# Patient Record
Sex: Female | Born: 1955
Health system: Southern US, Community
[De-identification: ages and names within clinical notes are randomized; demographics above are authoritative.]

## PROBLEM LIST (undated history)

## (undated) DIAGNOSIS — E1169 Type 2 diabetes mellitus with other specified complication: Secondary | ICD-10-CM

## (undated) DIAGNOSIS — I499 Cardiac arrhythmia, unspecified: Secondary | ICD-10-CM

## (undated) DIAGNOSIS — E119 Type 2 diabetes mellitus without complications: Secondary | ICD-10-CM

## (undated) DIAGNOSIS — I10 Essential (primary) hypertension: Secondary | ICD-10-CM

## (undated) DIAGNOSIS — H269 Unspecified cataract: Secondary | ICD-10-CM

## (undated) DIAGNOSIS — Q249 Congenital malformation of heart, unspecified: Secondary | ICD-10-CM

## (undated) DIAGNOSIS — J302 Other seasonal allergic rhinitis: Secondary | ICD-10-CM

## (undated) DIAGNOSIS — K219 Gastro-esophageal reflux disease without esophagitis: Secondary | ICD-10-CM

## (undated) DIAGNOSIS — E669 Obesity, unspecified: Secondary | ICD-10-CM

## (undated) DIAGNOSIS — I4891 Unspecified atrial fibrillation: Secondary | ICD-10-CM

## (undated) DIAGNOSIS — M1990 Unspecified osteoarthritis, unspecified site: Secondary | ICD-10-CM

## (undated) DIAGNOSIS — E785 Hyperlipidemia, unspecified: Secondary | ICD-10-CM

## (undated) HISTORY — DX: Type 2 diabetes mellitus with other specified complication: E11.69

## (undated) HISTORY — PX: TUBAL LIGATION: SHX77

## (undated) HISTORY — DX: Obesity, unspecified: E66.9

## (undated) HISTORY — DX: Essential (primary) hypertension: I10

## (undated) HISTORY — DX: Congenital malformation of heart, unspecified: Q24.9

## (undated) HISTORY — DX: Unspecified cataract: H26.9

## (undated) HISTORY — DX: Unspecified atrial fibrillation: I48.91

## (undated) HISTORY — DX: Gastro-esophageal reflux disease without esophagitis: K21.9

## (undated) HISTORY — DX: Hyperlipidemia, unspecified: E78.5

## (undated) HISTORY — DX: Type 2 diabetes mellitus without complications: E11.9

---

## 1991-02-08 HISTORY — PX: BREAST CYST EXCISION: SHX579

## 2001-01-15 ENCOUNTER — Emergency Department: Admit: 2001-01-15 | Payer: Self-pay | Source: Emergency Department | Admitting: Emergency Medicine

## 2001-07-06 ENCOUNTER — Emergency Department: Admit: 2001-07-06 | Payer: Self-pay | Source: Emergency Department | Admitting: Emergency Medicine

## 2001-09-17 ENCOUNTER — Ambulatory Visit: Admit: 2001-09-17 | Disposition: A | Discharge status: Home / Self Care | Payer: Self-pay | Source: Ambulatory Visit

## 2002-04-01 ENCOUNTER — Ambulatory Visit: Admit: 2002-04-01 | Disposition: A | Discharge status: Home / Self Care | Payer: Self-pay | Source: Ambulatory Visit

## 2002-07-09 ENCOUNTER — Ambulatory Visit: Admit: 2002-07-09 | Disposition: A | Discharge status: Home / Self Care | Payer: Self-pay | Source: Ambulatory Visit

## 2002-07-24 ENCOUNTER — Ambulatory Visit: Admit: 2002-07-24 | Disposition: A | Discharge status: Home / Self Care | Payer: Self-pay | Source: Ambulatory Visit

## 2002-09-25 ENCOUNTER — Ambulatory Visit: Admit: 2002-09-25 | Disposition: A | Discharge status: Home / Self Care | Payer: Self-pay | Source: Ambulatory Visit

## 2003-07-09 ENCOUNTER — Ambulatory Visit
Admit: 2003-07-09 | Disposition: A | Discharge status: Home / Self Care | Payer: Self-pay | Source: Ambulatory Visit | Admitting: Internal Medicine

## 2003-08-03 ENCOUNTER — Emergency Department: Admit: 2003-08-03 | Payer: Self-pay | Source: Emergency Department | Admitting: Emergency Medicine

## 2005-02-03 ENCOUNTER — Ambulatory Visit
Admit: 2005-02-03 | Disposition: A | Discharge status: Home / Self Care | Payer: Self-pay | Source: Ambulatory Visit | Admitting: Internal Medicine

## 2005-07-28 ENCOUNTER — Ambulatory Visit: Admit: 2005-07-28 | Disposition: A | Discharge status: Home / Self Care | Payer: Self-pay | Source: Ambulatory Visit

## 2005-11-18 ENCOUNTER — Emergency Department: Admit: 2005-11-18 | Payer: Self-pay | Source: Emergency Department | Admitting: Emergency Medicine

## 2006-04-03 ENCOUNTER — Ambulatory Visit: Admit: 2006-04-03 | Disposition: A | Discharge status: Home / Self Care | Payer: Self-pay | Source: Ambulatory Visit

## 2006-07-26 ENCOUNTER — Ambulatory Visit
Admit: 2006-07-26 | Disposition: A | Discharge status: Home / Self Care | Payer: Self-pay | Source: Ambulatory Visit | Admitting: Internal Medicine

## 2006-09-20 ENCOUNTER — Ambulatory Visit
Admit: 2006-09-20 | Disposition: A | Discharge status: Home / Self Care | Payer: Self-pay | Source: Ambulatory Visit | Admitting: Internal Medicine

## 2006-10-11 ENCOUNTER — Ambulatory Visit
Admit: 2006-10-11 | Disposition: A | Discharge status: Home / Self Care | Payer: Self-pay | Source: Ambulatory Visit | Admitting: Internal Medicine

## 2007-08-06 ENCOUNTER — Ambulatory Visit: Admit: 2007-08-06 | Disposition: A | Discharge status: Home / Self Care | Payer: Self-pay | Source: Ambulatory Visit

## 2007-09-27 ENCOUNTER — Ambulatory Visit
Admit: 2007-09-27 | Disposition: A | Discharge status: Home / Self Care | Payer: Self-pay | Source: Ambulatory Visit | Admitting: Internal Medicine

## 2008-02-21 ENCOUNTER — Ambulatory Visit: Admit: 2008-02-21 | Disposition: A | Discharge status: Home / Self Care | Payer: Self-pay | Source: Ambulatory Visit

## 2008-07-30 ENCOUNTER — Ambulatory Visit
Admit: 2008-07-30 | Disposition: A | Discharge status: Home / Self Care | Payer: Self-pay | Source: Ambulatory Visit | Admitting: Internal Medicine

## 2008-08-21 ENCOUNTER — Ambulatory Visit: Admit: 2008-08-21 | Disposition: A | Discharge status: Home / Self Care | Payer: Self-pay | Source: Ambulatory Visit

## 2008-11-19 ENCOUNTER — Ambulatory Visit: Admit: 2008-11-19 | Disposition: A | Discharge status: Home / Self Care | Payer: Self-pay | Source: Ambulatory Visit

## 2008-12-09 ENCOUNTER — Ambulatory Visit
Admit: 2008-12-09 | Disposition: A | Discharge status: Home / Self Care | Payer: Self-pay | Source: Ambulatory Visit | Admitting: Internal Medicine

## 2009-05-20 ENCOUNTER — Ambulatory Visit: Admit: 2009-05-20 | Disposition: A | Discharge status: Home / Self Care | Payer: Self-pay | Source: Ambulatory Visit

## 2009-07-15 ENCOUNTER — Ambulatory Visit: Admit: 2009-07-15 | Disposition: A | Discharge status: Home / Self Care | Payer: Self-pay | Source: Ambulatory Visit

## 2009-08-03 ENCOUNTER — Ambulatory Visit: Admit: 2009-08-03 | Disposition: A | Discharge status: Home / Self Care | Payer: Self-pay | Source: Ambulatory Visit

## 2009-09-11 ENCOUNTER — Ambulatory Visit
Admit: 2009-09-11 | Disposition: A | Discharge status: Home / Self Care | Payer: Self-pay | Source: Ambulatory Visit | Admitting: Family Medicine

## 2009-12-23 ENCOUNTER — Ambulatory Visit: Admit: 2009-12-23 | Disposition: A | Discharge status: Home / Self Care | Payer: Self-pay | Source: Ambulatory Visit

## 2010-03-03 ENCOUNTER — Ambulatory Visit: Admit: 2010-03-03 | Disposition: A | Discharge status: Home / Self Care | Payer: Self-pay | Source: Ambulatory Visit

## 2010-04-21 ENCOUNTER — Ambulatory Visit: Admit: 2010-04-21 | Disposition: A | Discharge status: Home / Self Care | Payer: Self-pay | Source: Ambulatory Visit

## 2010-08-26 ENCOUNTER — Ambulatory Visit
Admit: 2010-08-26 | Discharge: 2010-08-26 | Disposition: A | Discharge status: Home / Self Care | Payer: Self-pay | Source: Ambulatory Visit

## 2010-10-18 ENCOUNTER — Ambulatory Visit
Admit: 2010-10-18 | Discharge: 2010-10-18 | Disposition: A | Discharge status: Home / Self Care | Payer: Self-pay | Source: Ambulatory Visit

## 2010-12-02 ENCOUNTER — Ambulatory Visit
Admit: 2010-12-02 | Discharge: 2010-12-02 | Disposition: A | Discharge status: Home / Self Care | Payer: Self-pay | Source: Ambulatory Visit

## 2011-02-17 ENCOUNTER — Inpatient Hospital Stay
Admission: EM | Admit: 2011-02-17 | Disposition: A | Discharge status: Home / Self Care | Payer: Self-pay | Source: Emergency Department | Attending: Internal Medicine | Admitting: Internal Medicine

## 2011-02-17 DIAGNOSIS — R209 Unspecified disturbances of skin sensation: Secondary | ICD-10-CM

## 2011-02-17 LAB — CBC AND DIFFERENTIAL
Basophils Absolute Automated: 0.02 10*3/uL (ref 0.00–0.20)
Basophils Automated: 0 % (ref 0–2)
Eosinophils Absolute Automated: 0.2 10*3/uL (ref 0.00–0.70)
Eosinophils Automated: 2 % (ref 0–5)
Hematocrit: 40.6 % (ref 37.0–47.0)
Hgb: 14 g/dL (ref 12.0–16.0)
Immature Granulocytes Absolute: 0.03 10*3/uL
Immature Granulocytes: 0 % (ref 0–1)
Lymphocytes Absolute Automated: 3.17 10*3/uL (ref 0.50–4.40)
Lymphocytes Automated: 32 % (ref 15–41)
MCH: 31 pg (ref 28.0–32.0)
MCHC: 34.5 g/dL (ref 32.0–36.0)
MCV: 89.8 fL (ref 80.0–100.0)
MPV: 11.7 fL (ref 9.4–12.3)
Monocytes Absolute Automated: 0.78 10*3/uL (ref 0.00–1.20)
Monocytes: 8 % (ref 0–11)
Neutrophils Absolute: 5.74 10*3/uL (ref 1.80–8.10)
Neutrophils: 58 % (ref 52–75)
Nucleated RBC: 0 /100 WBC
Platelets: 237 10*3/uL (ref 140–400)
RBC: 4.52 10*6/uL (ref 4.20–5.40)
RDW: 13 % (ref 12–15)
WBC: 9.94 10*3/uL (ref 3.50–10.80)

## 2011-02-17 LAB — COMPREHENSIVE METABOLIC PANEL
ALT: 62 U/L — ABNORMAL HIGH (ref 0–55)
AST (SGOT): 48 U/L — ABNORMAL HIGH (ref 5–34)
Albumin/Globulin Ratio: 1.2 (ref 0.9–2.2)
Albumin: 4.2 g/dL (ref 3.5–5.0)
Alkaline Phosphatase: 113 U/L (ref 40–150)
Anion Gap: 13 (ref 5.0–15.0)
BUN: 13 mg/dL (ref 7.0–19.0)
Bilirubin, Total: 0.3 mg/dL (ref 0.2–1.2)
CO2: 25 mEq/L (ref 22–29)
Calcium: 9.7 mg/dL (ref 8.5–10.5)
Chloride: 102 mEq/L (ref 98–107)
Creatinine: 0.7 mg/dL (ref 0.6–1.0)
Globulin: 3.6 g/dL (ref 2.0–3.6)
Glucose: 199 mg/dL — ABNORMAL HIGH (ref 70–100)
Potassium: 4 mEq/L (ref 3.5–5.1)
Protein, Total: 7.8 g/dL (ref 6.0–8.3)
Sodium: 140 mEq/L (ref 136–145)

## 2011-02-17 LAB — GFR: EGFR: >60

## 2011-02-17 LAB — ECG 12-LEAD
Atrial Rate: 78 {beats}/min
P Axis: 83 degrees
P-R Interval: 208 ms
Q-T Interval: 370 ms
QRS Duration: 88 ms
QTC Calculation (Bezet): 421 ms
R Axis: 74 degrees
T Axis: 56 degrees
Ventricular Rate: 78 {beats}/min

## 2011-02-17 LAB — URINALYSIS, REFLEX TO MICROSCOPIC EXAM IF INDICATED
Bilirubin, UA: NEGATIVE
Blood, UA: NEGATIVE
Glucose, UA: 500 — AB
Ketones UA: NEGATIVE
Leukocyte Esterase, UA: NEGATIVE
Nitrite, UA: NEGATIVE
Protein, UR: NEGATIVE
Specific Gravity UA POCT: 1.009 (ref 1.001–1.035)
Urine pH: 7 (ref 5.0–8.0)
Urobilinogen, UA: NEGATIVE mg/dL

## 2011-02-17 LAB — TROPONIN I: Troponin I: 0.01 ng/mL (ref 0.00–0.09)

## 2011-02-17 LAB — TSH: TSH: 0.58 u[IU]/mL (ref 0.35–4.94)

## 2011-02-17 LAB — HEMOLYSIS INDEX: Hemolysis Index: 11 Index (ref 0–18)

## 2011-02-17 NOTE — Consults (Signed)
[name], [name]                                            MRN:          [id number]                                                          Account:      [id number]                                        Document ID:  [id number]                                                   Service Date: 02/17/2011                                                                                    Admit Date: 02/17/2011     Patient Location: A2507-B  Patient Type: I     CONSULTING PHYSICIAN: Gwenevere Abbot MD     REFERRING PHYSICIAN: Valente David MD        CONSULTING SERVICE:  Neurology.     HISTORY OF PRESENT ILLNESS:  Thank you for referring [name] for a neurological consultation.  As  you know, she is a 56 year old right-handed woman who has been admitted for  episode of paresthesias.  The patient says that yesterday night about 8:00,  she suddenly started feeling tingling all over her body and even involving  her face.  It lasted for about 2 hours.  Her son brought her here.  In the  past she has had an episode where she fell down.     The patient was also feeling dizzy and had difficulty walking.  The  numbness was initially on the right side of the face and right upper and  lower extremity.  She had a similar episode about 2 weeks ago.     MEDICATIONS:  Norvasc, aspirin, Tenormin, Lovenox, NovoLog insulin, Zestril, Protonix.     ALLERGIES:  No known drug allergies.     PAST MEDICAL HISTORY:  Hypertension and diabetes mellitus.     PAST SURGICAL HISTORY:  Left breast fibroma removal, CABG.     SOCIAL HISTORY:  Negative for smoking or alcohol use.     FAMILY HISTORY:  Diabetes mellitus and high blood pressure.     REVIEW OF SYSTEMS:  Negative for fever or rashes.  Page 1 of 2  [name], [name]                                            MRN:          [id number]                                                           Account:      [id number]                                        Document ID:  [id number]                                                   Service Date: 02/17/2011                                                                                       PHYSICAL EXAMINATION:  GENERAL:  She was alert and oriented, in no acute distress.  VITAL SIGNS:  Temperature 97.5, pulse 61 per minute, respiratory rate 18  per minute, blood pressure 122/62 mmHg, and pulse oximetry 96%.  NEUROLOGIC:  Speech and language were intact to casual conversation and  history taking.  She was appropriate, cooperative, and followed commands.  Pupils equal, reacting to light bilaterally.  There was no relative  afferent pupillary defect.  Extraocular movements were full without  nystagmus.  Visual fields are full on confrontation.  Face was symmetrical.   Tongue was midline.  She moved all 4 extremities equally.  Sensation was  intact to touch.  She had intact finger-to-nose testing bilaterally.  No  abnormal movements were observed.  Gait examination was deferred.  Deep  tendon reflexes were bilaterally symmetrical and toes were downgoing  bilaterally.     LABORATORY AND DIAGNOSTIC DATA:  White count was 9.94, platelets 237.  Glucose 199.  MRI of the brain has  been ordered.  CT scan of the head did not show any acute abnormalities.     IMPRESSION:  In summary, this is a 56 year old woman with vasculopathic risk factors,  who presented with episodes of paresthesias lasting for a few hours.  Some  were associated with dizziness and gait imbalance. rule out stroke versus tia.     RECOMMENDATIONS:  MRI of the brain and MRA head and neck.  Please check fasting cholesterol   panel.   Continue management of vasculopathic risk factors.     Further plans will be made as test results are available.     Thank you for allowing [place] to participate in the care of  this patient.     Electronic Signing Provider     D:  02/17/2011 09:28 AM by Dr.  Gwenevere Abbot, MD ([id number])  T:  02/17/2011 09:54 AM by [id number]        cc:                                                                                                           Page 2 of 2  Authenticated and Edited by Gwenevere Abbot, MD ([id number]) On 02/17/11 8:51:33 PM

## 2011-02-17 NOTE — H&P (Signed)
[name], [name]                                                    MRN:          [id number]                                                          Account:      [id number]                                        Document ID:  [id number]                                                                                                                                        Admit Date: 02/17/2011     Patient Location: A1000-12  Patient Type: I     ATTENDING PHYSICIAN: Yates Decamp, MD        CHIEF COMPLAINT:  Right-sided numbness.     HISTORY OF PRESENT ILLNESS:  This is a 56 year old female with a past medical history of hypertension  and diabetes mellitus, who presented to emergency room for right-sided body  and face numbness with dizziness and gait difficulty episode.  The patient  states about  numbness of the right facial area and right upper or lower   extremity, which happened last night.  She had a problem with the maintenance   of gait, and then she developed dizziness as well.  All her symptoms resolved   spontaneously in the emergency room.  The patient denies any chest pain or   shortness of breath.  No seizure, no nausea or vomiting. The patient had a   similar episode around 2 weeks ago, which resolved by itself.     ALLERGIES:  No known drug allergies.     SOCIAL HISTORY:  No history of smoking cigarette, alcohol drinking, or illicit drug use.     PAST MEDICAL HISTORY:  Diabetes mellitus and hypertension.     MEDICATIONS AT HOME:  Amlodipine 10 mg p.o. daily, lisinopril/hydrochlorothiazide 20/12.5 mg p.o.  daily, atenolol 50 mg p.o. daily, pravastatin 20 mg p.o. daily, metformin  1000 mg p.o. daily, baby aspirin 81 mg p.o. daily.     REVIEW OF SYSTEMS:  Right facial and right upper and lower extremity numbness, as described  above.  Positive dizziness, but no headache.  No vomiting, no nausea.  No  fever, no chest pain or shortness of  breath.  No abdominal pain.  No change  in bowel  habits, no urinary symptoms.  Review of other systems is negative.     FAMILY HISTORY:  CAD and diabetes mellitus.                                                                                                           Page 1 of 2  [name], [name]                                                    MRN:          [id number]                                                          Account:      [id number]                                        Document ID:  [id number]                                                                                                                                           PHYSICAL EXAMINATION:  VITAL SIGNS:  In the emergency room, temperature is 97.8, pulse 72,  respiratory rate 14, blood pressure 150/75, pulse oximetry 98% on room air.  GENERAL:  At presentation, alert, oriented x3, no focal deficit.  Speech is  clear.  HEENT:  Anicteric sclerae, pink conjunctivae.  NECK:  No cervical adenopathy, no thyromegaly, no carotid bruit.  CHEST:  Clear to auscultation bilaterally.  CARDIOVASCULAR:  S1, S2 normal intensity.  No murmur or gallop.  ABDOMEN:  Soft.  No distention or tenderness.  Bowel sounds present.  EXTREMITIES:  No peripheral edema.  SKIN:  No rashes.  NEUROLOGIC:  At present, no focal deficits.  Cranial nerves II-XII grossly  normal.  Speech is clear.  The patient has mild sensory deficit at the  right upper and lower extremity.  No facial drooping.  LABORATORY AND DIAGNOSTIC DATA:  BUN 13, creatinine 0.7, sodium 140, potassium 4, chloride 102, bicarbonate  25.  WBC 9.9, hemoglobin 14, hematocrit 40, platelets 237.  CT of head  unremarkable.     ASSESSMENT AND PLAN:  1.  Right-sided numbness episode, rule out acute stroke.  The patient will  be admitted to telemetry bed.  Continue with aspirin and Lovenox.  Follow  with neurologic check.  Follow with the MRI of the brain, MRA of head and neck.    Follow with the neurology consultation. 2.  History of hypertension.   Continue   with the lisinopril, Norvasc, and atenolol, blood pressure monitoring and heart   rate. 3.  Diabetes mellitus.  Continue with the metformin, sliding scale insulin  and fingerstick monitoring.           Electronic Signing Provider     D:  02/17/2011 06:17 AM by Dr. Yates Decamp, MD ([id number])  T:  02/17/2011 06:36 AM by [id number]        cc:                                                                                                           Page 2 of 2  Authenticated and Edited by Yates Decamp, MD ([id number]) On 02/17/11 8:04:23 AM

## 2011-02-18 LAB — LIPID PANEL
Cholesterol / HDL Ratio: 3 Index
Cholesterol: 163 mg/dL (ref 0–199)
HDL: 54 mg/dL (ref >40)
LDL Calculated: 84 mg/dL (ref 0–99)
Triglycerides: 126 mg/dL (ref 34–149)
VLDL Calculated: 25 mg/dL (ref 10–40)

## 2011-02-18 LAB — HEMOLYSIS INDEX: Hemolysis Index: 8 Index (ref 0–9)

## 2011-02-19 LAB — HEMOGLOBIN A1C: Hemoglobin A1C: 8.3 % — ABNORMAL HIGH (ref 0.0–6.0)

## 2011-03-02 ENCOUNTER — Encounter (INDEPENDENT_AMBULATORY_CARE_PROVIDER_SITE_OTHER): Payer: Self-pay | Admitting: Neurology

## 2011-03-29 ENCOUNTER — Ambulatory Visit: Payer: Self-pay | Attending: Ophthalmology

## 2011-03-29 ENCOUNTER — Ambulatory Visit: Payer: Self-pay | Attending: Ophthalmology | Admitting: Ophthalmology

## 2011-03-29 DIAGNOSIS — H409 Unspecified glaucoma: Secondary | ICD-10-CM

## 2011-03-29 DIAGNOSIS — H4010X0 Unspecified open-angle glaucoma, stage unspecified: Secondary | ICD-10-CM

## 2011-03-29 NOTE — Progress Notes (Signed)
I/P:   1. 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 H/o unreliable field and OCT tests in the past. IOP stable. Continue Lumigan OU.   8m for DFE for diabetes

## 2011-03-29 NOTE — Progress Notes (Signed)
OCT done Difficult OS constant   Eye movement.

## 2011-03-29 NOTE — Progress Notes (Signed)
HVF  Done

## 2011-08-03 ENCOUNTER — Encounter: Payer: Self-pay | Admitting: Ophthalmology

## 2011-08-03 ENCOUNTER — Ambulatory Visit: Payer: Self-pay | Attending: Ophthalmology | Admitting: Ophthalmology

## 2011-08-03 DIAGNOSIS — E11329 Type 2 diabetes mellitus with mild nonproliferative diabetic retinopathy without macular edema: Secondary | ICD-10-CM | POA: Insufficient documentation

## 2011-08-03 DIAGNOSIS — E113299 Type 2 diabetes mellitus with mild nonproliferative diabetic retinopathy without macular edema, unspecified eye: Secondary | ICD-10-CM

## 2011-08-03 DIAGNOSIS — H579 Unspecified disorder of eye and adnexa: Secondary | ICD-10-CM

## 2011-08-03 DIAGNOSIS — E1139 Type 2 diabetes mellitus with other diabetic ophthalmic complication: Secondary | ICD-10-CM | POA: Insufficient documentation

## 2011-08-03 NOTE — Progress Notes (Signed)
I/P:  1. IDDM: NPDR OU. Strict glycemic & BP control.   2. 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   Fundus photos taken today  43m f/u HVF, OCT and DFE

## 2011-11-09 ENCOUNTER — Other Ambulatory Visit: Payer: Self-pay | Admitting: Family Medicine

## 2011-11-09 DIAGNOSIS — Z1231 Encounter for screening mammogram for malignant neoplasm of breast: Secondary | ICD-10-CM

## 2011-11-21 ENCOUNTER — Ambulatory Visit: Payer: Self-pay | Attending: Family Medicine

## 2011-11-21 DIAGNOSIS — Z1231 Encounter for screening mammogram for malignant neoplasm of breast: Secondary | ICD-10-CM

## 2012-01-25 ENCOUNTER — Ambulatory Visit: Payer: Charity | Attending: Ophthalmology

## 2012-01-25 ENCOUNTER — Ambulatory Visit (HOSPITAL_BASED_OUTPATIENT_CLINIC_OR_DEPARTMENT_OTHER): Payer: Charity

## 2012-01-25 ENCOUNTER — Ambulatory Visit: Payer: Charity | Admitting: Ophthalmology

## 2012-01-25 DIAGNOSIS — H40009 Preglaucoma, unspecified, unspecified eye: Secondary | ICD-10-CM

## 2012-01-25 DIAGNOSIS — H579 Unspecified disorder of eye and adnexa: Secondary | ICD-10-CM | POA: Insufficient documentation

## 2012-01-25 MED ORDER — BIMATOPROST 0.03 % OP SOLN
1.00 [drp] | Freq: Every evening | OPHTHALMIC | Status: DC
Start: 2012-01-25 — End: 2013-06-28

## 2012-01-25 NOTE — Progress Notes (Signed)
56 y/o Hispanic women with:    1. 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 Blepharitis: Warm compresses twice daily for 10 minutes each time. Wash eyelids thorougly for 15 seconds with water/baby shampoo.     3. Dry Eye Syndrome: Use over-the-counter preservative free artificial tears one drop in each eye four times per day (example brands: Refresh, Systane). If no improvement, consider punctal plugs.    4. Diabetes without retinopathy: Encourage tight blood sugar control. Encouraged follow-up with PCP.     4-5 months

## 2012-01-31 NOTE — Progress Notes (Signed)
OCr

## 2012-02-09 NOTE — Progress Notes (Signed)
OCT Done

## 2012-03-16 ENCOUNTER — Other Ambulatory Visit: Payer: Self-pay | Admitting: Family Medicine

## 2012-03-16 DIAGNOSIS — R7989 Other specified abnormal findings of blood chemistry: Secondary | ICD-10-CM

## 2012-03-21 ENCOUNTER — Ambulatory Visit: Payer: Charity | Attending: Family Medicine

## 2012-03-21 DIAGNOSIS — K7689 Other specified diseases of liver: Secondary | ICD-10-CM | POA: Insufficient documentation

## 2012-03-21 DIAGNOSIS — R160 Hepatomegaly, not elsewhere classified: Secondary | ICD-10-CM | POA: Insufficient documentation

## 2012-03-21 DIAGNOSIS — E119 Type 2 diabetes mellitus without complications: Secondary | ICD-10-CM | POA: Insufficient documentation

## 2012-03-21 DIAGNOSIS — R7989 Other specified abnormal findings of blood chemistry: Secondary | ICD-10-CM

## 2012-06-01 ENCOUNTER — Ambulatory Visit (INDEPENDENT_AMBULATORY_CARE_PROVIDER_SITE_OTHER): Payer: BC Managed Care – PPO | Admitting: Internal Medicine

## 2012-06-01 ENCOUNTER — Encounter (INDEPENDENT_AMBULATORY_CARE_PROVIDER_SITE_OTHER): Payer: Self-pay | Admitting: Internal Medicine

## 2012-06-01 VITALS — BP 143/68 | HR 66 | Temp 99.0°F | Resp 16 | Wt 203.0 lb

## 2012-06-01 DIAGNOSIS — Z794 Long term (current) use of insulin: Secondary | ICD-10-CM | POA: Insufficient documentation

## 2012-06-01 DIAGNOSIS — H409 Unspecified glaucoma: Secondary | ICD-10-CM

## 2012-06-01 DIAGNOSIS — E785 Hyperlipidemia, unspecified: Secondary | ICD-10-CM | POA: Insufficient documentation

## 2012-06-01 DIAGNOSIS — E669 Obesity, unspecified: Secondary | ICD-10-CM | POA: Insufficient documentation

## 2012-06-01 DIAGNOSIS — E119 Type 2 diabetes mellitus without complications: Secondary | ICD-10-CM

## 2012-06-01 DIAGNOSIS — I10 Essential (primary) hypertension: Secondary | ICD-10-CM | POA: Insufficient documentation

## 2012-06-01 HISTORY — DX: Unspecified glaucoma: H40.9

## 2012-06-01 MED ORDER — INSULIN NPH ISOPHANE & REGULAR (70-30) 100 UNIT/ML SC SUSP
52.00 [IU] | SUBCUTANEOUS | Status: DC
Start: 2012-06-01 — End: 2012-06-18

## 2012-06-01 NOTE — Progress Notes (Signed)
Subjective:       Patient ID: [name] is a 57 y.o. female.  Seen in clinic on 06/01/2012    Chief Complaint   Patient presents with   . Establish Care   . Diabetes   . Hypertension       HPI  Patient is a 57 year old Hispanic female from [place] with history of obesity, diabetes mellitus 2, hypertension, hyperlipidemia, diabetic retinopathy (per patient), who presents today to establish care and also for followup of diabetes, hypertension, and hyperlipidemia.  Patient states she was being followed by Dr. Ashley Royalty at [facility], last seen in fall 2013.      States she had a recent liver ultrasound done due to elevated liver enzymes, does not know results.     Diabetes mellitus - diagnosed in 1993.  States last A1C was around 9%.  Reports compliance with meds.  Son works at [employer] and apparently has taught her about diabetes. Last eye exam was 02/2012.  Admits having bilateral lower extremity distal paresthesias.  She reports compliance with   Novolin 70/30 55 units am, 25 units in pm.  Measures blood glucose prior to bedtime which runs in 150's, per patient. Tried Lantus but states it didn't work.  Reports compliance with metformin 500mg  BID.      Hypertension - diagnosed 1990's.  BP's at home in 130's/140's systolic.  Denies any problems with recurrent headaches, chest pain, palpitations, shortness of breath, or lower extremity edema.  She reports compliance with amlodipine 10 mg daily, atenolol 50 mg daily, and lisinopril 20 mg daily.    Hyperlipidemia - she does not know the results of her last lipid panel but thinks they were elevated.  She reports compliance with pravastatin 20 mg daily.  She admits she does not watch her diet much though she reports walking frequently.  She denies any myalgias or abdominal pain.    The following portions of the patient's history were reviewed and updated as appropriate: allergies, current medications, past family history, past medical history, past social history,  past surgical history and problem list.    Past Medical History   Diagnosis Date   . Glaucoma 06/01/2012   . Diabetes mellitus type 2 in obese    . Hypertension    . Hyperlipidemia    . Obesity        Past Surgical History   Procedure Date   . Breast cyst excision 1993     bilateral       History     Social History   . Marital Status: Single     Spouse Name: N/A     Number of Children: 3   . Years of Education: N/A     Occupational History   . Maintenance      Social History Main Topics   . Smoking status: Never Smoker    . Smokeless tobacco: Never Used   . Alcohol Use: No   . Drug Use: No   . Sexually Active: Not on file     Other Topics Concern   . Not on file     Social History Narrative    Originally from [place].  Lives with her daughter.Exercise: walks frequently       Family History   Problem Relation Age of Onset   . Heart disease Mother    . Diabetes Mother    . Hypertension Mother    . Hyperlipidemia Mother    . Heart disease Father    .  Diabetes Father    . Hypertension Father    . Hyperlipidemia Father    . Cancer Maternal Aunt      liver       No Known Allergies    Current Outpatient Prescriptions   Medication Sig Dispense Refill   . amLODIPine (NORVASC) 10 MG tablet Take 10 mg by mouth daily.         Marland Kitchen aspirin 81 MG tablet Take 81 mg by mouth daily.         Marland Kitchen atenolol (TENORMIN) 50 MG tablet Take 50 mg by mouth daily.       . bimatoprost (LUMIGAN) 0.03 % ophthalmic drops Place 1 drop into both eyes nightly.  2.5 mL  6   . Calcium Carbonate-Vitamin D (CALCIUM-D PO) Take 1 tablet by mouth daily.       . insulin NPH-insulin regular (NOVOLIN 70/30) (70-30) 100 UNIT/ML injection Inject 52 Units into the skin See Admin Instructions. Take 52 units in the morning and 25 units in the evening  10 mL  2   . lisinopril (PRINIVIL,ZESTRIL) 20 MG tablet Take 40 mg by mouth daily.        . metFORMIN (GLUCOPHAGE) 500 MG tablet Take 1,000 mg by mouth 2 (two) times daily with meals.        Marland Kitchen glipiZIDE (GLUCOTROL) 10 MG  tablet Take 10 mg by mouth 2 (two) times daily before meals.         . pravastatin (PRAVACHOL) 20 MG tablet Take 20 mg by mouth daily.           Review of Systems   Constitutional: Negative for fever, chills, fatigue and unexpected weight change.        -polydypsia or polyuria   HENT: Negative for hearing loss.    Eyes: Negative for visual disturbance.   Respiratory: Negative for cough, chest tightness and shortness of breath.    Cardiovascular: Negative for chest pain, palpitations and leg swelling.        +pain on right medial leg from varicose veins     Gastrointestinal: Negative for nausea, vomiting, diarrhea and constipation.   Genitourinary: Negative for difficulty urinating, vaginal pain, pelvic pain and dyspareunia.   Musculoskeletal: Negative for myalgias and arthralgias.   Skin: Negative for rash.   Neurological: Negative for dizziness, weakness, light-headedness and headaches.        Has occasional pain on head and numbness on face  +distal paresthesias     Hematological: Does not bruise/bleed easily.   Psychiatric/Behavioral: Negative for dysphoric mood.         Objective:    Physical Exam   Vitals reviewed.  Constitutional: She is oriented to person, place, and time. She appears well-developed. No distress.        obese   HENT:        Moist mucous membranes   Eyes: Conjunctivae normal are normal. Right eye exhibits no discharge. Left eye exhibits no discharge. No scleral icterus.   Neck: Neck supple.   Cardiovascular: Normal rate and regular rhythm.  Exam reveals no gallop and no friction rub.    No murmur heard.  Pulmonary/Chest: Effort normal and breath sounds normal. No respiratory distress. She has no wheezes. She has no rales.   Abdominal: Soft. There is no tenderness. There is no rebound and no guarding.   Neurological: She is alert and oriented to person, place, and time.        Monofilament testing impaired on bilateral distal  lower extremities   Skin: Skin is warm and dry. She is not  diaphoretic.   Psychiatric: Her behavior is normal.     BP 143/68  Pulse 66  Temp 99 F (37.2 C) (Oral)  Resp 16  Wt 92.08 kg (203 lb)  SpO2 97%        Assessment:       1. DM (diabetes mellitus), type 2  Ambulatory referral to Ophthalmology, Hemoglobin A1C, insulin NPH-insulin regular (NOVOLIN 70/30) (70-30) 100 UNIT/ML injection, ECG 12 lead   2. Hypertension  Comprehensive metabolic panel, ECG 12 lead   3. Hyperlipidemia  Lipid panel, ECG 12 lead           Plan:       1.  Diabetes mellitus 2 - Poorly controlled as evidenced by patient's home readings and by her last reported A1C several months ago.  We have asked patient to check her blood glucose pre-meal 2 times a day and bring the log into her next visit.  We will also check an A1C to evaluate status of diabetes.  For now, we will continue current Novolin 70/30 55 units in the morning and 25 units in the afternoon.      2.  Hypertension - Blood pressure is above goal of <130/68mmHg, which we discussed w/ patient.  She is hesitant to make medication adjustments at this time.  We have advised her to check BP's at home or at the pharmacy 1-2 days a week and bring log into her next visit.  For now, continue amlodipine 10mg  daily and atenolol 50mg  daily as well as lisinopril 20mg  daily (consider increasing to 40mg  daily during next visit).  Advised low-salt diet and regular exercise as tolerated.     3.  Hyperlipidemia - patient will RTC for fasting lipid panel.  For now continue current pravastatin 20mg  daily.  Advised weight loss and low-fat diet.      4.  Advised to have records sent from prior PCP.    5.  RTC in 1-2 weeks for f/u diabetes, hypertension, hyperlipidemia, and discuss above lab results.

## 2012-06-04 ENCOUNTER — Encounter (INDEPENDENT_AMBULATORY_CARE_PROVIDER_SITE_OTHER): Payer: Self-pay

## 2012-06-06 ENCOUNTER — Ambulatory Visit (INDEPENDENT_AMBULATORY_CARE_PROVIDER_SITE_OTHER): Payer: BC Managed Care – PPO

## 2012-06-06 DIAGNOSIS — I10 Essential (primary) hypertension: Secondary | ICD-10-CM

## 2012-06-06 DIAGNOSIS — E785 Hyperlipidemia, unspecified: Secondary | ICD-10-CM

## 2012-06-06 DIAGNOSIS — E119 Type 2 diabetes mellitus without complications: Secondary | ICD-10-CM

## 2012-06-06 NOTE — Progress Notes (Signed)
Pt visited office for FBW today.  Pt tolerated procedure well.  No further questions or concerns at this time.

## 2012-06-07 LAB — COMPREHENSIVE METABOLIC PANEL
ALT: 59 IU/L — ABNORMAL HIGH (ref 0–32)
AST (SGOT): 58 IU/L — ABNORMAL HIGH (ref 0–40)
Albumin/Globulin Ratio: 1.4 (ref 1.1–2.5)
Albumin: 4.1 g/dL (ref 3.5–5.5)
Alkaline Phosphatase: 117 IU/L (ref 39–117)
BUN / Creatinine Ratio: 19 (ref 9–23)
BUN: 12 mg/dL (ref 6–24)
Bilirubin, Total: 0.3 mg/dL (ref 0.0–1.2)
CO2: 25 mmol/L (ref 19–28)
Calcium: 9.5 mg/dL (ref 8.7–10.2)
Chloride: 103 mmol/L (ref 97–108)
Creatinine: 0.64 mg/dL (ref 0.57–1.00)
EGFR: 100 mL/min/{1.73_m2} (ref >59)
EGFR: 115 mL/min/{1.73_m2} (ref >59)
Globulin, Total: 3 g/dL (ref 1.5–4.5)
Glucose: 90 mg/dL (ref 65–99)
Potassium: 4.4 mmol/L (ref 3.5–5.2)
Protein, Total: 7.1 g/dL (ref 6.0–8.5)
Sodium: 142 mmol/L (ref 134–144)

## 2012-06-07 LAB — LIPID PANEL
Cholesterol / HDL Ratio: 3.6 ratio units (ref 0.0–4.4)
Cholesterol: 170 mg/dL (ref 100–199)
HDL: 47 mg/dL (ref >39)
LDL Calculated: 104 mg/dL — ABNORMAL HIGH (ref 0–99)
Triglycerides: 95 mg/dL (ref 0–149)
VLDL Calculated: 19 mg/dL (ref 5–40)

## 2012-06-07 LAB — HEMOGLOBIN A1C: Hemoglobin A1C: 8.1 % — ABNORMAL HIGH (ref 4.8–5.6)

## 2012-06-15 ENCOUNTER — Ambulatory Visit (INDEPENDENT_AMBULATORY_CARE_PROVIDER_SITE_OTHER): Payer: BC Managed Care – PPO | Admitting: Internal Medicine

## 2012-06-18 ENCOUNTER — Encounter (INDEPENDENT_AMBULATORY_CARE_PROVIDER_SITE_OTHER): Payer: Self-pay | Admitting: Internal Medicine

## 2012-06-18 ENCOUNTER — Ambulatory Visit (INDEPENDENT_AMBULATORY_CARE_PROVIDER_SITE_OTHER): Payer: BC Managed Care – PPO | Admitting: Internal Medicine

## 2012-06-18 VITALS — BP 146/73 | HR 69 | Temp 97.7°F | Ht 65.0 in | Wt 201.0 lb

## 2012-06-18 DIAGNOSIS — K760 Fatty (change of) liver, not elsewhere classified: Secondary | ICD-10-CM | POA: Insufficient documentation

## 2012-06-18 DIAGNOSIS — I10 Essential (primary) hypertension: Secondary | ICD-10-CM

## 2012-06-18 DIAGNOSIS — E119 Type 2 diabetes mellitus without complications: Secondary | ICD-10-CM

## 2012-06-18 DIAGNOSIS — R748 Abnormal levels of other serum enzymes: Secondary | ICD-10-CM | POA: Insufficient documentation

## 2012-06-18 DIAGNOSIS — K7689 Other specified diseases of liver: Secondary | ICD-10-CM

## 2012-06-18 DIAGNOSIS — I872 Venous insufficiency (chronic) (peripheral): Secondary | ICD-10-CM

## 2012-06-18 MED ORDER — ATENOLOL 50 MG PO TABS
50.0000 mg | ORAL_TABLET | Freq: Every day | ORAL | Status: DC
Start: 2012-06-18 — End: 2012-09-27

## 2012-06-18 MED ORDER — LISINOPRIL 40 MG PO TABS
40.0000 mg | ORAL_TABLET | Freq: Every day | ORAL | Status: DC
Start: 2012-06-18 — End: 2012-09-21

## 2012-06-18 MED ORDER — GLIPIZIDE 10 MG PO TABS
10.0000 mg | ORAL_TABLET | Freq: Every day | ORAL | Status: DC
Start: 2012-06-18 — End: 2012-09-21

## 2012-06-18 MED ORDER — INSULIN NPH ISOPHANE & REGULAR (70-30) 100 UNIT/ML SC SUSP
57.0000 [IU] | SUBCUTANEOUS | Status: DC
Start: 2012-06-18 — End: 2012-09-27

## 2012-06-18 MED ORDER — METFORMIN HCL 1000 MG PO TABS
1000.0000 mg | ORAL_TABLET | Freq: Two times a day (BID) | ORAL | Status: DC
Start: 2012-06-18 — End: 2012-09-21

## 2012-06-18 MED ORDER — INSULIN SYRINGE 30G X 5/16" 1 ML MISC
1.00 [IU] | Freq: Two times a day (BID) | Status: DC
Start: 2012-06-18 — End: 2012-12-28

## 2012-06-18 MED ORDER — AMLODIPINE BESYLATE 10 MG PO TABS
10.0000 mg | ORAL_TABLET | Freq: Every day | ORAL | Status: DC
Start: 2012-06-18 — End: 2012-09-27

## 2012-06-18 NOTE — Progress Notes (Signed)
Has the patient sought any care outside of the [facility]? No

## 2012-06-18 NOTE — Progress Notes (Signed)
Subjective:       Patient ID: [name] is a 57 y.o. female.  Encounter performed in Spanish    Chief Complaint   Patient presents with   . Diabetes     follow up   . Hypertension       HPI  Patient is a 57 year old Hispanic female from [place] with history of obesity, diabetes mellitus 2, hypertension, hyperlipidemia, diabetic retinopathy who presents today to discuss recent lab results.     Diabetes mellitus -Last A1C done last month was 8.1%.   Reports compliance with meds. Last eye exam was 02/2012. Admits having bilateral lower extremity distal paresthesias. She reports compliance with Novolin 70/30 55 units am, 25 units in pm. Measures blood glucose prior to bedtime which runs in 90's-100's and in the am runs in 120's to 130's but she did not bring in her glucometer. Reports compliance with metformin 1000mg  BID and glipizide 10mg  daily  .   Hypertension - BP's at home in 130's/140's systolic, per patient. Denies any problems with recurrent headaches, chest pain, palpitations, shortness of breath, or lower extremity edema. She reports compliance with amlodipine 10 mg daily, atenolol 50 mg daily, and lisinopril 20 mg daily.       The following portions of the patient's history were reviewed and updated as appropriate: allergies, current medications, past medical history, past social history and problem list.    Past Medical History   Diagnosis Date   . Glaucoma 06/01/2012   . Diabetes mellitus type 2 in obese    . Hypertension    . Hyperlipidemia    . Obesity        History     Social History   . Marital Status: Single     Spouse Name: N/A     Number of Children: 3   . Years of Education: N/A     Occupational History   . Maintenance      Social History Main Topics   . Smoking status: Never Smoker    . Smokeless tobacco: Never Used   . Alcohol Use: No   . Drug Use: No   . Sexually Active: Not on file     Other Topics Concern   . Not on file     Social History Narrative    Originally from [name].  Lives  with her daughter.Exercise: walks frequently       No Known Allergies    Current Outpatient Prescriptions   Medication Sig Dispense Refill   . amLODIPine (NORVASC) 10 MG tablet Take 10 mg by mouth daily.         Marland Kitchen aspirin 81 MG tablet Take 81 mg by mouth daily.         Marland Kitchen atenolol (TENORMIN) 50 MG tablet Take 50 mg by mouth daily.       . bimatoprost (LUMIGAN) 0.03 % ophthalmic drops Place 1 drop into both eyes nightly.  2.5 mL  6   . Calcium Carbonate-Vitamin D (CALCIUM-D PO) Take 1 tablet by mouth daily.       Marland Kitchen glipiZIDE (GLUCOTROL) 10 MG tablet Take 10 mg by mouth 2 (two) times daily before meals.         . insulin NPH-insulin regular (NOVOLIN 70/30) (70-30) 100 UNIT/ML injection Inject 52 Units into the skin See Admin Instructions. Take 52 units in the morning and 25 units in the evening  10 mL  2   . lisinopril (PRINIVIL,ZESTRIL) 20 MG tablet Take 40 mg  by mouth daily.        . metFORMIN (GLUCOPHAGE) 500 MG tablet Take 1,000 mg by mouth 2 (two) times daily with meals.        . pravastatin (PRAVACHOL) 20 MG tablet Take 20 mg by mouth daily.           Review of Systems  Per HPI      Objective:    Physical Exam   Vitals reviewed.  Constitutional: She is oriented to person, place, and time.        Obese     HENT:        Moist mucous membranes   Eyes:        Wearing glasses   Pulmonary/Chest: No respiratory distress.   Neurological: She is alert and oriented to person, place, and time.   Skin: Skin is warm and dry.        There is skin hyperpigmentation and multiple varicose veins on bilateral distal lower extremities (R>L) suggesting moderate to advanced venous insufficiency   Psychiatric: Her behavior is normal.     BP 146/73  Pulse 69  Temp 97.7 F (36.5 C)  Ht 1.651 m (5\' 5" )  Wt 91.173 kg (201 lb)  BMI 33.45 kg/m2      Chemistry        Component Value Date/Time    NA 142 06/06/2012 0801    K 4.4 06/06/2012 0801    CL 103 06/06/2012 0801    CO2 25 06/06/2012 0801    BUN 12 06/06/2012 0801    GLU 90 06/06/2012  0801    GLU 199* 02/17/2011 0410        Component Value Date/Time    ALKPHOS 117 06/06/2012 0801    AST 58* 06/06/2012 0801    ALT 59* 06/06/2012 0801        Lab Results   Component Value Date    CHOL 170 06/06/2012    CHOL 163 02/18/2011     Lab Results   Component Value Date    HDL 47 06/06/2012    HDL 54 02/18/2011     Lab Results   Component Value Date    LDL 104* 06/06/2012    LDL 84 02/18/2011     Lab Results   Component Value Date    TRIG 95 06/06/2012    TRIG 126 02/18/2011     Lab Results   Component Value Date    HGBA1C 8.1* 06/06/2012           Assessment:       1. DM (diabetes mellitus), type 2  insulin NPH-insulin regular (NOVOLIN 70/30) (70-30) 100 UNIT/ML injection, metFORMIN (GLUCOPHAGE) 1000 MG tablet, glipiZIDE (GLUCOTROL) 10 MG tablet, Insulin Syringe-Needle U-100 (INSULIN SYRINGE 1CC/30GX5/16") 30G X 5/16" 1 ML MISC   2. Hypertension  lisinopril (PRINIVIL,ZESTRIL) 40 MG tablet, atenolol (TENORMIN) 50 MG tablet, amLODIPine (NORVASC) 10 MG tablet   3. Venous insufficiency  Ambulatory referral to Vascular Surgery         Plan:       Diabetes mellitus 2 - Poorly controlled as evidenced by last A1C.  Will increase Novolin 70/30 from 55 units in the am to 57 units in the am and 25 units in the pm to 27 units in the pm.  Continue current metformin 1000mg  BID and glipizide 10mg  daily (in am).  Referral to ophthalmology was re-printed and given to patient.  Information booklet on DM was given to patient in Spanish.  Advised daily  self-foot checks.     Hypertension - suboptimally controlled, with goal <130/45mmHg.  Increase lisinopril from 20mg  daily to 40mg  daily.  Continue amlodipine 10mg  daily and atenolol 50mg  daily.  Advised low-salt diet.  Already walks regularly. Advised to continue monitoring BP's at home w/ goal as above.     Bilateral venous insufficiency with right lower extremity resulting pain - Given patient has been using compression stockings w/o relief, gave her a referral to vein restoration center  for additional evaluation and management    Continue daily ASA 81mg .      Declined colonoscopy.  Would like to have annual occult blood test.     Discussed the above lab results w/ patient and answered all questions.     Risk & Benefits of the new medication(s) were explained to the patient (and family) who verbalized understanding & agreed to the treatment plan. Patient (family) encouraged to contact me/clinical staff with any questions/concerns    Once again, we have advised patient to have records from her prior primary care provider sent to our clinic.    RTC in 3 months for f/u diabetes, hypertension, sooner as needed.

## 2012-06-26 ENCOUNTER — Encounter (INDEPENDENT_AMBULATORY_CARE_PROVIDER_SITE_OTHER): Payer: Self-pay | Admitting: Internal Medicine

## 2012-06-27 ENCOUNTER — Ambulatory Visit: Payer: Charity | Admitting: Ophthalmology

## 2012-07-04 ENCOUNTER — Encounter (INDEPENDENT_AMBULATORY_CARE_PROVIDER_SITE_OTHER): Payer: Self-pay

## 2012-07-04 NOTE — Progress Notes (Signed)
I briefly spoke with the patient at [phone] with the assistance of Spanish Interpreter [phone].  I introduced myself.  She is very interested in Diabetes education.  She says her Fasting BGs have been ranging 90 to 100.  She confirmed that she is taking the Novolin SQ 57 units in the morning and 27 units in the afternoon.    Since she is unable to talk much further at this time, she asks the information to be mailed to her.  Address verified.  The following additional information was mailed:  Recipes  How to create a plate (Spanish version)  Carb counting and meal planning booklet (Spanish version)  [facility] brochure (Spanish version)  Things to know about hypoglycemia handout  Specific Goals for Diabetic Patients  Type 2 Diabetes Basic, 3rd Ed, International Diabetes Center, 2009 - Patient's Total Type 2 Diabetes From Head to Toe (Spanish version)  Diabetes: Foot Care (Spanish version)  Diabetes and Low Blood Sugar (Spanish version)  Basics of Injectable Medicines for Diabetes (Spanish version)  Injectible Medicines for Type 2 Diabetes (Spanish version)  When to Seek Emergency Help for Your Diabetes (Spanish version)  What to Know About Diabetes and Cholesterol (Spanish version)  Type 2 Diabetes and Your Sick-Day Plan (Spanish version)  [facility] brochure (Spanish version)  10 helpful things to know about meal planning (Spanish version)  Carb Counting for Hispanic Foods  Diabetes Nutrition Placemat  Carb Choices and Portion Size  Mix and Scientist, product/process development Tips from ADA  [employer] and [employer] Tips from ADA  [employer] Facts from International Diabetes [facility]  Best Fast Foods information  Healthy Fast Foods information  App Happy article (Diabetes Forecast on January 2014)  White potato versus Sweet potato  Liver handout  80 Calorie Carb Chart - Snacks, Cereals, Crackers information  Meat and Meat Substitutes    I will follow up with her in a couple of  weeks.  I have encouraged her to call me as needed.  She is agreeable.

## 2012-07-17 ENCOUNTER — Encounter (INDEPENDENT_AMBULATORY_CARE_PROVIDER_SITE_OTHER): Payer: Self-pay

## 2012-07-23 ENCOUNTER — Encounter (INDEPENDENT_AMBULATORY_CARE_PROVIDER_SITE_OTHER): Payer: Self-pay

## 2012-07-23 NOTE — Progress Notes (Signed)
I tried to leave a message on the mobile phone [phone]  but no voice mail set up.     I have left a message for the patient at [phone] with the assistance of Spanish Interpreter [phone] to discuss DM further.  Pending call back.

## 2012-09-14 ENCOUNTER — Encounter (INDEPENDENT_AMBULATORY_CARE_PROVIDER_SITE_OTHER): Payer: Self-pay

## 2012-09-14 NOTE — Progress Notes (Signed)
With the assistance of a Spanish Interpreter [phone] I tried to reach the patient at [phone] (M) but unable to leave a message.  We then left a message for the patient at  [phone] (H) about her appointment with Dr. Charlesetta Ivory on 09/18/12 at 0730 and to take in her BG meter and logs.  I asked her to call me back for additional education for DM.  Pending.

## 2012-09-18 ENCOUNTER — Ambulatory Visit (INDEPENDENT_AMBULATORY_CARE_PROVIDER_SITE_OTHER): Payer: BC Managed Care – PPO | Admitting: Internal Medicine

## 2012-09-21 ENCOUNTER — Other Ambulatory Visit (INDEPENDENT_AMBULATORY_CARE_PROVIDER_SITE_OTHER): Payer: Self-pay | Admitting: Internal Medicine

## 2012-09-21 ENCOUNTER — Telehealth (INDEPENDENT_AMBULATORY_CARE_PROVIDER_SITE_OTHER): Payer: Self-pay | Admitting: Internal Medicine

## 2012-09-21 DIAGNOSIS — I10 Essential (primary) hypertension: Secondary | ICD-10-CM

## 2012-09-21 DIAGNOSIS — E119 Type 2 diabetes mellitus without complications: Secondary | ICD-10-CM

## 2012-09-21 MED ORDER — METFORMIN HCL 1000 MG PO TABS
1000.0000 mg | ORAL_TABLET | Freq: Two times a day (BID) | ORAL | Status: DC
Start: 2012-09-21 — End: 2012-09-27

## 2012-09-21 MED ORDER — LISINOPRIL 40 MG PO TABS
40.0000 mg | ORAL_TABLET | Freq: Every day | ORAL | Status: DC
Start: 2012-09-21 — End: 2012-09-27

## 2012-09-21 MED ORDER — GLIPIZIDE 10 MG PO TABS
10.0000 mg | ORAL_TABLET | Freq: Every day | ORAL | Status: DC
Start: 2012-09-21 — End: 2012-09-27

## 2012-09-21 NOTE — Telephone Encounter (Signed)
Prescriptions for glipizide, lisinopril and metformin were sent to preferred pharmacy for #30 days w/o refills.

## 2012-09-21 NOTE — Telephone Encounter (Signed)
Patient left a message on the spanish line requesting a refills for metFORMIN (GLUCOPHAGE) 1000 MG tablet, lisinopril (PRINIVIL,ZESTRIL) 40 MG tablet, and glipiZIDE (GLUCOTROL) 10 MG tablet. She has an appt scheduled for 09/27/12 but she will not have enough meds to last her until she come in. She is requesting for the meds to be called into the [facility] on file.      Thank you!

## 2012-09-27 ENCOUNTER — Ambulatory Visit (INDEPENDENT_AMBULATORY_CARE_PROVIDER_SITE_OTHER): Payer: BC Managed Care – PPO | Admitting: Internal Medicine

## 2012-09-27 ENCOUNTER — Encounter (INDEPENDENT_AMBULATORY_CARE_PROVIDER_SITE_OTHER): Payer: Self-pay | Admitting: Internal Medicine

## 2012-09-27 VITALS — BP 149/76 | HR 68 | Temp 98.3°F | Resp 16 | Ht 65.0 in | Wt 206.4 lb

## 2012-09-27 DIAGNOSIS — M79672 Pain in left foot: Secondary | ICD-10-CM

## 2012-09-27 DIAGNOSIS — E785 Hyperlipidemia, unspecified: Secondary | ICD-10-CM

## 2012-09-27 DIAGNOSIS — E119 Type 2 diabetes mellitus without complications: Secondary | ICD-10-CM

## 2012-09-27 DIAGNOSIS — I10 Essential (primary) hypertension: Secondary | ICD-10-CM

## 2012-09-27 DIAGNOSIS — M79609 Pain in unspecified limb: Secondary | ICD-10-CM

## 2012-09-27 MED ORDER — PRAVASTATIN SODIUM 20 MG PO TABS
20.0000 mg | ORAL_TABLET | Freq: Every day | ORAL | Status: DC
Start: 2012-09-27 — End: 2012-12-28

## 2012-09-27 MED ORDER — LISINOPRIL 40 MG PO TABS
40.0000 mg | ORAL_TABLET | Freq: Every day | ORAL | Status: DC
Start: 2012-09-27 — End: 2012-12-28

## 2012-09-27 MED ORDER — GLIPIZIDE 10 MG PO TABS
10.0000 mg | ORAL_TABLET | Freq: Every day | ORAL | Status: DC
Start: 2012-09-27 — End: 2012-12-28

## 2012-09-27 MED ORDER — INSULIN NPH ISOPHANE & REGULAR (70-30) 100 UNIT/ML SC SUSP
62.0000 [IU] | SUBCUTANEOUS | Status: DC
Start: 2012-09-27 — End: 2012-12-28

## 2012-09-27 MED ORDER — AMLODIPINE BESYLATE 10 MG PO TABS
10.0000 mg | ORAL_TABLET | Freq: Every day | ORAL | Status: DC
Start: 2012-09-27 — End: 2012-12-28

## 2012-09-27 MED ORDER — HYDROCHLOROTHIAZIDE 12.5 MG PO TABS
12.5000 mg | ORAL_TABLET | Freq: Every day | ORAL | Status: DC
Start: 2012-09-27 — End: 2012-12-28

## 2012-09-27 MED ORDER — METFORMIN HCL 1000 MG PO TABS
1000.0000 mg | ORAL_TABLET | Freq: Two times a day (BID) | ORAL | Status: DC
Start: 2012-09-27 — End: 2012-12-28

## 2012-09-27 MED ORDER — ATENOLOL 50 MG PO TABS
50.0000 mg | ORAL_TABLET | Freq: Every day | ORAL | Status: DC
Start: 2012-09-27 — End: 2012-12-28

## 2012-09-27 NOTE — Progress Notes (Signed)
Subjective:       Patient ID: [name] is a 57 y.o. female.  Chief Complaint   Patient presents with   . Diabetes   . Hypertension       HPI  Patient isa 57 y/o hispanic female with history of obesity, diabetes mellitus type 2 hypertension, hyperlipidemia, fatty liver who presents today for followup of medical problems.    Diabetes mellitus 2 - her last hemoglobin A1c was 8.1% in April 2014. Patient reports compliance with metformin 1000 mg 2 times a day, Novolin 70/30 57 units in the morning and 27 units in the evening, glipizide 10mg  daily.  Patient measures her blood sugars 2 times a day and states that prior to breakfast her blood sugars have been running in the 90s to 100s and prior to bedtime they have been running around 100 in the 80s. She does not bring her blood glucose log in today. She does admit she has episodes of tingling in the face when she has low blood sugars and they are higher than the 200s. Of note, she had MRI of the brain and MRI angiogram which did not show any significant abnormalities.  She does not want to go to diabetes education classes as she states she does not have the time to go. She checks her feet regularly and complains of having frequent left foot pain. She has never seen a podiatrist. She was followed by her ophthalmologist regularly for her history of glaucoma and was due to see him in November.  She has never seen a cardiologist.  She denies any problems with frequent chest pain, palpitations, lightheadedness, shortness of breath, increasing lower extremity edema.    Hypertension - she has been checking her blood pressures at home which have been running in the 140 systolic and 70s diastolic. She reports compliance with amlodipine 10 mg daily, atenolol 50 mg daily, lisinopril 40 mg daily. She denies any problems with frequent headaches or visual changes. She has gained 5 pounds since her last visit.    Hyperlipidemia - her last lipid panel done in April 2014 showed  slightly elevated LDL.  She reports compliance with pravastatin 20 mg daily and denies any problems with myalgias or abdominal pain.    She has been seen at the fenestration clinics and apparently recently had a procedure done. She is wearing bilateral compression stockings regularly.    She does have an extensive family history of coronary artery disease.      The following portions of the patient's history were reviewed and updated as appropriate: allergies, current medications, past medical history, past social history and problem list.    Past Medical History   Diagnosis Date   . Glaucoma 06/01/2012   . Diabetes mellitus type 2 in obese    . Hypertension    . Hyperlipidemia    . Obesity        History     Social History   . Marital Status: Single     Spouse Name: N/A     Number of Children: 3   . Years of Education: N/A     Occupational History   . Maintenance      Social History Main Topics   . Smoking status: Never Smoker    . Smokeless tobacco: Never Used   . Alcohol Use: No   . Drug Use: No   . Sexually Active: Not on file     Other Topics Concern   . Not on  file     Social History Narrative    Originally from [place].  Lives with her daughter.Exercise: walks frequently       No Known Allergies    Current Outpatient Prescriptions   Medication Sig Dispense Refill   . amLODIPine (NORVASC) 10 MG tablet Take 1 tablet (10 mg total) by mouth daily.  30 tablet  2   . aspirin 81 MG tablet Take 81 mg by mouth daily.         Marland Kitchen atenolol (TENORMIN) 50 MG tablet Take 1 tablet (50 mg total) by mouth daily.  30 tablet  2   . bimatoprost (LUMIGAN) 0.03 % ophthalmic drops Place 1 drop into both eyes nightly.  2.5 mL  6   . Calcium Carbonate-Vitamin D (CALCIUM-D PO) Take 1 tablet by mouth daily.       Marland Kitchen glipiZIDE (GLUCOTROL) 10 MG tablet Take 1 tablet (10 mg total) by mouth daily.  30 tablet  0   . insulin NPH-insulin regular (NOVOLIN 70/30) (70-30) 100 UNIT/ML injection Inject 57 Units into the skin See Admin Instructions.  Take 57 units in the morning and 27 units in the evening  30 mL  2   . Insulin Syringe-Needle U-100 (INSULIN SYRINGE 1CC/30GX5/16") 30G X 5/16" 1 ML MISC 1 Unit by Does not apply route 2 (two) times daily.  100 each  2   . lisinopril (PRINIVIL,ZESTRIL) 40 MG tablet Take 1 tablet (40 mg total) by mouth daily.  30 tablet  0   . metFORMIN (GLUCOPHAGE) 1000 MG tablet Take 1 tablet (1,000 mg total) by mouth 2 (two) times daily with meals.  60 tablet  0   . pravastatin (PRAVACHOL) 20 MG tablet Take 20 mg by mouth daily.           Review of Systems   Constitutional: Negative for fever and chills.   Respiratory: Negative for chest tightness and shortness of breath.    Cardiovascular: Negative for chest pain and palpitations.   Gastrointestinal: Negative for nausea, vomiting, abdominal pain, constipation and blood in stool.   Genitourinary: Negative for dysuria and hematuria.   Neurological: Negative for light-headedness and headaches.        Facial numbness with fluctuations in blood sugars as per HPI     Psychiatric/Behavioral: Negative for dysphoric mood. The patient is not nervous/anxious.    hormone will      Objective:    Physical Exam   Vitals reviewed.  Constitutional: She appears well-developed. No distress.        Obese     HENT:        Moist mucous membranes   Eyes: Conjunctivae normal are normal. Right eye exhibits no discharge. Left eye exhibits no discharge. No scleral icterus.        Wearing glasses   Cardiovascular: Normal rate and regular rhythm.  Exam reveals no gallop and no friction rub.    No murmur heard.  Pulmonary/Chest: Breath sounds normal. No respiratory distress. She has no wheezes. She has no rales.   Abdominal: Soft. There is no tenderness. There is no rebound and no guarding.   Musculoskeletal: She exhibits no edema.        Wearing bilateral lower extremity compression stockings.    Neurological: She is alert.   Skin: Skin is warm and dry. She is not diaphoretic.   Psychiatric: Her behavior is  normal.     BP 149/76  Pulse 68  Temp 98.3 F (36.8 C) (Oral)  Resp  16  Ht 1.651 m (5\' 5" )  Wt 93.611 kg (206 lb 6 oz)  BMI 34.34 kg/m2  SpO2 97%    Lab Results   Component Value Date    HGBA1C 8.1* 06/06/2012     '  Chemistry        Component Value Date/Time    NA 142 06/06/2012 0801    K 4.4 06/06/2012 0801    CL 103 06/06/2012 0801    CO2 25 06/06/2012 0801    BUN 12 06/06/2012 0801    CREAT 0.64 06/06/2012 0801    CREAT 0.7 02/17/2011 0410    GLU 90 06/06/2012 0801    GLU 199* 02/17/2011 0410        Component Value Date/Time    CA 9.5 06/06/2012 0801    ALKPHOS 117 06/06/2012 0801    AST 58* 06/06/2012 0801    ALT 59* 06/06/2012 0801    BILITOTAL 0.3 06/06/2012 0801        Lab Results   Component Value Date    CHOL 170 06/06/2012    CHOL 163 02/18/2011     Lab Results   Component Value Date    HDL 47 06/06/2012    HDL 54 02/18/2011     Lab Results   Component Value Date    LDL 104* 06/06/2012    LDL 84 02/18/2011     Lab Results   Component Value Date    TRIG 95 06/06/2012    TRIG 126 02/18/2011           Assessment:       1. DM (diabetes mellitus), type 2  Hemoglobin A1C    Comprehensive metabolic panel    Microalbumin, Random Urine    Creatinine, urine, random    glipiZIDE (GLUCOTROL) 10 MG tablet    insulin NPH-insulin regular (HUMULIN,NOVOLIN 70/30) (70-30) 100 UNIT/ML injection    metFORMIN (GLUCOPHAGE) 1000 MG tablet    Ambulatory referral to Cardiology    Ambulatory referral to Podiatry   2. Hypertension  Comprehensive metabolic panel    amLODIPine (NORVASC) 10 MG tablet    atenolol (TENORMIN) 50 MG tablet    lisinopril (PRINIVIL,ZESTRIL) 40 MG tablet    hydrochlorothiazide (HYDRODIURIL) 12.5 MG tablet    Ambulatory referral to Cardiology   3. Hyperlipidemia  Comprehensive metabolic panel    pravastatin (PRAVACHOL) 20 MG tablet    Ambulatory referral to Cardiology   4. Left foot pain  Ambulatory referral to Podiatry           Plan:       Diabetes mellitus 2 - suboptimally controlled last hemoglobin A1c was 7%. We will  increase the dose of her Novolin 70/30 from 57 units in the morning to 62 units in the morning and she will continue current 27 units in the evening.  She will continue current glipizide 10 mg daily and also metformin 1000mg  BID.  Advised to continue checking blood glucose 2-3 times a day.  Patient does not want to go to diabetes education classes at this time and she does not have the time. I have given her a referral to podiatry for routine diabetic foot exam and also for the management of her chronic left hip pain. She is aware she should be seen by ophthalmology regularly for that her glaucoma and also for diabetic eye exams. We have strongly encouraged her to work on weight loss and also regular exercise.    Hypertension - Suboptimally controlled.  Goal is BP <  130/30mmHg.  Will begin therapy w/ HCTZ 12.5mg  daily and continue current lisinopril 40mg  daily, amlodipine 10mg  daily and atenolol 50mg  daily.  Advised low-salt diet and regular exercise.  Continue to monitor BP's at home.     Hyperlipidemia - Borderline control.  Advised diet low in saturated fat.  Continue current pravastatin 20mg  daily.      Will check the above labs.    Gave patient a referral to cardiology for possible stress test to evaluate for CAD given her multiple risk factors and also extensive family history.    RTC in 3 months for f/u diabetes, hypertension, hyperlipemia.  May RTC sooner as needed.

## 2012-09-28 LAB — COMPREHENSIVE METABOLIC PANEL
ALT: 48 IU/L — ABNORMAL HIGH (ref 0–32)
AST (SGOT): 43 IU/L — ABNORMAL HIGH (ref 0–40)
Albumin/Globulin Ratio: 1.4 (ref 1.1–2.5)
Albumin: 4.1 g/dL (ref 3.5–5.5)
Alkaline Phosphatase: 120 IU/L — ABNORMAL HIGH (ref 39–117)
BUN / Creatinine Ratio: 20 (ref 9–23)
BUN: 11 mg/dL (ref 6–24)
Bilirubin, Total: 0.3 mg/dL (ref 0.0–1.2)
CO2: 26 mmol/L (ref 18–29)
Calcium: 9.1 mg/dL (ref 8.7–10.2)
Chloride: 100 mmol/L (ref 97–108)
Creatinine: 0.55 mg/dL — ABNORMAL LOW (ref 0.57–1.00)
EGFR: 104 mL/min/{1.73_m2} (ref >59)
EGFR: 120 mL/min/{1.73_m2} (ref >59)
Globulin, Total: 2.9 g/dL (ref 1.5–4.5)
Glucose: 149 mg/dL — ABNORMAL HIGH (ref 65–99)
Potassium: 5.3 mmol/L — ABNORMAL HIGH (ref 3.5–5.2)
Protein, Total: 7 g/dL (ref 6.0–8.5)
Sodium: 140 mmol/L (ref 134–144)

## 2012-09-28 LAB — MICROALBUMIN, RANDOM URINE
Creatinine, UR: 43.8 mg/dL (ref 15.0–278.0)
Microalbumin, UR: 11.5 ug/mL (ref 0.0–17.0)
Microalbumin/Creatinine Ratio: 26.3 mg/g creat (ref 0.0–30.0)

## 2012-09-28 LAB — HEMOGLOBIN A1C: Hemoglobin A1C: 7.9 % — ABNORMAL HIGH (ref 4.8–5.6)

## 2012-10-18 ENCOUNTER — Encounter (INDEPENDENT_AMBULATORY_CARE_PROVIDER_SITE_OTHER): Payer: Self-pay

## 2012-10-31 ENCOUNTER — Other Ambulatory Visit (INDEPENDENT_AMBULATORY_CARE_PROVIDER_SITE_OTHER): Payer: Self-pay | Admitting: Internal Medicine

## 2012-10-31 DIAGNOSIS — R748 Abnormal levels of other serum enzymes: Secondary | ICD-10-CM

## 2012-11-05 ENCOUNTER — Encounter (INDEPENDENT_AMBULATORY_CARE_PROVIDER_SITE_OTHER): Payer: Self-pay

## 2012-11-05 NOTE — Progress Notes (Signed)
I have spoken with the patient at [phone] with Spanish Interpreters [phone], [phone], [phone], and [phone] because connection was dropped several times.  She does not have an e-mail. We discussed the following:    Diabetes:    She was diagnosed in 1993.  She has not attended Diabetes classes     Blood Glucoses:  She checks BID - Fasting and HS.  Her Fasting range is 70s to 199.  She is symptomatic at 27s.  Her HS range is low 100s to low 200s.    Blood Pressure:  She has a BP machine.  She checks several times a month and as needed for BP.  The BP has memory.    Medications:  Novolin SQ - She takes 57 units in the morning and 27 units in the evening.  Metformin (1000 mg) - She takes one pill po BID with Breakfast and Dinner.  Glipizide (10 mg) - She takes one pill po QD with Breakfast.  We discussed the purpose of the medication and to take before meals.  She will adjust.      Meal Planning:  She uses her breakfast (cereal, 2% milk and small banana).  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  She does try to eat dinner in the evening at the same time but it is usually salad.    We discussed the relationship of liver/blood glucose, relationship of protein/carbohydrates, relationship of fat/absorption of meal, protein options, carbohydrate choices, and  Type 2 Diabetes Basic, 3rd Ed, International Diabetes [facility], 2009 - Your Personal Food Plan for a female to lose weight (6-9 carbohydrate choices/90-135 grams of carbohydrates) are recommended every day.  She will incorporate this into her regimen and try to eat a healthy carbohydrate choice with a little protein within 1 hour of waking up, at least every 6 hours throughout the day, and within 1 hour before sleep.        Feet:  She checks her feet daily.  We discussed checking shoes before wearing them.  She will consider.      Vaccinations:  Flu - She receives annually.    Pneumococcal - We discussed.  She will  discuss further Dr. Charlesetta Ivory.     Follow Up:  Ophthalmologist - She sees annually.  Dentist - We discussed the importance of dental hygiene to prevent gum recession and infection.  She is aware it is important to see biannually.  Dr. Charlesetta Ivory - She has an appointment on 12/28/12 at 0730.    Address verified.  The following additional information was mailed:  Recipes  Diabetes Nutrition Placemat  Carb Choices and Portion Size  Mix and Match Pantry Grocery List  Breakfast/Lunch/Dinner Tips from ADA  Fun Food and Meal Makeover Tips from ADA  Want to lose 10 pounds this year? from ADA  [employer] Facts from International Diabetes [facility]  Five Easy Steps to Create Your Plate  Best Fast Foods information  Healthy Fast Foods information  Foot Care Tips and Exercises  Carb counting and meal planning booklet  Things to know about hypoglycemia handout  Specific Goals for Diabetic Patients  [facility] information (Lowering Salt for Heart Health, and the Activity Pyramid)   Type 2 Diabetes Basic, 3rd Ed, International Diabetes [facility], 2009 - Your Personal Food Plan  Dash diet Information  App Happy article (Diabetes Forecast on January 2014)  Carbohydrate Counting List  White potato versus Sweet potato  Liver handout  80 Calorie Carb Chart - Snacks, Cereals, Crackers information  Meat and Meat Substitutes    I have encouraged her to call me if any questions.  She is agreeable.     Discussion was 1 1/2 hours.

## 2012-11-06 NOTE — Progress Notes (Signed)
Additional information placed in the packet and mailed to the patient:  Recuento de carbohidratos para personas con diabetes tipo 2  Conozca los pasos basicos de la diabetes  La diabetes tipo 2 de la cabeza a los pies  Aspectos basicos de Physiological scientist alimentacion sludable pera la diabetes  La diabetes y Dance movement psychotherapist bajo nivel de Chief Financial Officer en la sangre  Conozca los pasos basicos de la diabetes  List de verifcacion para cita medica de la diabetes  Consejos para administrarse medicamentos inyectables para la diabetes tipo 2  Medicamentos iyectables para la diabetes: datos basicos

## 2012-11-06 NOTE — Progress Notes (Signed)
This is a correction of the contents of the package mailed to the patient adapted for the Spanish need.  Diabetes Nutrition Placemat (Spanish version)  Carb Choices and Portion Size   Mix and Match Pantry Grocery List   Fun Food Facts from [facility]   Five Easy Steps to Create Your Plate (Spanish version)  Best Fast Foods information   Healthy Fast Foods information   Carb counting and meal planning booklet (Spanish version)  Specific Goals for Diabetic Patients   [facility] information (Lowering Salt for Heart Health, and the Activity Pyramid)   Type 2 Diabetes Basic, 3rd Ed, [facility], 2009 - Your Personal Food Plan   Dash diet Information   App Happy article (Diabetes Forecast on January 2014)   Carbohydrate Counting List   White potato versus Sweet potato   Liver handout   [facility] brochure (Spanish version)  Viva bien con la Diabetes  80 Calorie Carb Chart - Snacks, Cereals, Crackers information   Meat and Meat Substitutes

## 2012-11-08 ENCOUNTER — Encounter (INDEPENDENT_AMBULATORY_CARE_PROVIDER_SITE_OTHER): Payer: Self-pay

## 2012-11-19 ENCOUNTER — Encounter (INDEPENDENT_AMBULATORY_CARE_PROVIDER_SITE_OTHER): Payer: Self-pay

## 2012-12-11 ENCOUNTER — Encounter (INDEPENDENT_AMBULATORY_CARE_PROVIDER_SITE_OTHER): Payer: Self-pay

## 2012-12-26 ENCOUNTER — Telehealth (INDEPENDENT_AMBULATORY_CARE_PROVIDER_SITE_OTHER): Payer: Self-pay

## 2012-12-26 NOTE — Telephone Encounter (Signed)
RN Navigator placed previsit call to patient for appt scheduled Friday December 28, 2012 at 0730;    Are you currently taking medications?  Yes    Has there been any changes to your current medication list?  no    Do you have any barriers/adverse reactions to your current medications: No  If YES, please explain:    Have you sought any care outside of the [facility]?  Not reported  If YES, please explain:    Instructed to bring BG logs / meter to appt for review.

## 2012-12-28 ENCOUNTER — Encounter (INDEPENDENT_AMBULATORY_CARE_PROVIDER_SITE_OTHER): Payer: Self-pay | Admitting: Internal Medicine

## 2012-12-28 ENCOUNTER — Ambulatory Visit (INDEPENDENT_AMBULATORY_CARE_PROVIDER_SITE_OTHER): Payer: BC Managed Care – PPO | Admitting: Internal Medicine

## 2012-12-28 ENCOUNTER — Other Ambulatory Visit (INDEPENDENT_AMBULATORY_CARE_PROVIDER_SITE_OTHER): Payer: Self-pay | Admitting: Internal Medicine

## 2012-12-28 VITALS — BP 149/69 | HR 76 | Temp 98.4°F | Resp 16 | Ht 65.0 in | Wt 208.4 lb

## 2012-12-28 DIAGNOSIS — E119 Type 2 diabetes mellitus without complications: Secondary | ICD-10-CM

## 2012-12-28 DIAGNOSIS — Z23 Encounter for immunization: Secondary | ICD-10-CM

## 2012-12-28 DIAGNOSIS — I10 Essential (primary) hypertension: Secondary | ICD-10-CM

## 2012-12-28 DIAGNOSIS — E785 Hyperlipidemia, unspecified: Secondary | ICD-10-CM

## 2012-12-28 MED ORDER — INSULIN SYRINGE 30G X 5/16" 1 ML MISC
1.0000 [IU] | Freq: Two times a day (BID) | Status: DC
Start: 2012-12-28 — End: 2013-03-29

## 2012-12-28 MED ORDER — LISINOPRIL 40 MG PO TABS
40.0000 mg | ORAL_TABLET | Freq: Every day | ORAL | Status: DC
Start: 2012-12-28 — End: 2013-03-29

## 2012-12-28 MED ORDER — AMLODIPINE BESYLATE 10 MG PO TABS
10.0000 mg | ORAL_TABLET | Freq: Every day | ORAL | Status: DC
Start: 2012-12-28 — End: 2013-03-29

## 2012-12-28 MED ORDER — HYDROCHLOROTHIAZIDE 25 MG PO TABS
25.0000 mg | ORAL_TABLET | Freq: Every day | ORAL | Status: DC
Start: 2012-12-28 — End: 2013-03-29

## 2012-12-28 MED ORDER — HYDROCHLOROTHIAZIDE 12.5 MG PO TABS
12.5000 mg | ORAL_TABLET | Freq: Every day | ORAL | Status: DC
Start: 2012-12-28 — End: 2012-12-28

## 2012-12-28 MED ORDER — GLIPIZIDE ER 5 MG PO TB24
5.0000 mg | ORAL_TABLET | Freq: Every day | ORAL | Status: DC
Start: 2012-12-28 — End: 2013-03-29

## 2012-12-28 MED ORDER — INSULIN NPH ISOPHANE & REGULAR (70-30) 100 UNIT/ML SC SUSP
62.0000 [IU] | SUBCUTANEOUS | Status: DC
Start: 2012-12-28 — End: 2013-03-11

## 2012-12-28 MED ORDER — ATENOLOL 50 MG PO TABS
50.0000 mg | ORAL_TABLET | Freq: Every day | ORAL | Status: DC
Start: 2012-12-28 — End: 2013-03-29

## 2012-12-28 MED ORDER — PRAVASTATIN SODIUM 20 MG PO TABS
20.0000 mg | ORAL_TABLET | Freq: Every day | ORAL | Status: DC
Start: 2012-12-28 — End: 2013-03-29

## 2012-12-28 MED ORDER — METFORMIN HCL 1000 MG PO TABS
1000.0000 mg | ORAL_TABLET | Freq: Two times a day (BID) | ORAL | Status: DC
Start: 2012-12-28 — End: 2013-03-29

## 2012-12-28 NOTE — Telephone Encounter (Signed)
Called pharmacy back:  [facility] for 31G syringe-needles  Changed rx from HCTZ 12.5mg  tablets to 25mg  tablets, take one tablet daily

## 2012-12-28 NOTE — Progress Notes (Signed)
Subjective:       Patient ID: [name] is a 57 y.o. female.  Encounter performed in Spanish  Chief Complaint   Patient presents with   . Diabetes   . Hypertension   . Hyperlipidemia       HPI  Patient is a 57 year old Hispanic female with history of diabetes mellitus 2, hypertension, hyperlipidemia who presents today for followup of diabetes, hypertension, and hyperlipidemia.    Since her last visit she has been seen by cardiology and had a stress test that did not show any significant abnormalities.    In regards to her diabetes mellitus 2 - she has been measuring her blood sugars 2 times a day, usually before breakfast, which she states have been running around 100s to 110.  She also mentions her blood sugars prior to dinner which she states have been from 9200.  Her last hemoglobin A1c was 9%.  She denies any polydipsia, polyuria, extremity numbness or tingling.  She does daily self checks and has not noticed any allergies.  She reports compliance with Novolin 70-30 52 units in the morning and 27 units in the evening as well as glipizide 10 mg daily  1 metformin 1000 mg 2 times a day.  She follows up regularly with ophthalmology due to her history of glaucoma    In regards to her hypertension, she states she has been measuring her blood pressures at home which have been running in the 140 systolic and 60s to 70s diastolic.  She reports complaints of amlodipine 10 mg daily, atenolol 50 mg daily, lisinopril 40 mg daily, and hydrochlorothiazide 12.5 mg daily.  She denies any chest pain, palpitations, dyspnea on exertion, lower extremity edema.  She does have occasional headaches on the posterior aspect of her head.    Her last Pap was in April 2014 showed acceptable control.  She reports compliance with pravastatin 20 mg daily and denies any problems with recurrent myalgias or abdominal pain.    She has been following up with the pain specialist who states she has a followup appointment with them in a  month.    Has been having arthralgias of elbows, left foot - saw podiatrist And states she had x-rays done which did not show any significant abnormalities.    She would like to have an influenza vaccine today.    The following portions of the patient's history were reviewed and updated as appropriate: allergies, current medications, past medical history, past social history and problem list.    Past Medical History   Diagnosis Date   . Glaucoma 06/01/2012   . Diabetes mellitus type 2 in obese    . Hypertension    . Hyperlipidemia    . Obesity        History     Social History   . Marital Status: Single     Spouse Name: N/A     Number of Children: 3   . Years of Education: N/A     Occupational History   . Maintenance      Social History Main Topics   . Smoking status: Never Smoker    . Smokeless tobacco: Never Used   . Alcohol Use: No   . Drug Use: No   . Sexually Active: Not on file     Other Topics Concern   . Not on file     Social History Narrative    Originally from [place].  Lives with her daughter.Exercise: walks frequently  No Known Allergies    Current Outpatient Prescriptions   Medication Sig Dispense Refill   . amLODIPine (NORVASC) 10 MG tablet Take 1 tablet (10 mg total) by mouth daily.  30 tablet  2   . aspirin 81 MG tablet Take 81 mg by mouth daily.         Marland Kitchen atenolol (TENORMIN) 50 MG tablet Take 1 tablet (50 mg total) by mouth daily.  30 tablet  2   . bimatoprost (LUMIGAN) 0.03 % ophthalmic drops Place 1 drop into both eyes nightly.  2.5 mL  6   . Calcium Carbonate-Vitamin D (CALCIUM-D PO) Take 1 tablet by mouth daily.       Marland Kitchen glipiZIDE (GLUCOTROL) 10 MG tablet Take 1 tablet (10 mg total) by mouth daily.  30 tablet  2   . hydrochlorothiazide (HYDRODIURIL) 12.5 MG tablet Take 1 tablet (12.5 mg total) by mouth daily.  30 tablet  2   . insulin NPH-insulin regular (HUMULIN,NOVOLIN 70/30) (70-30) 100 UNIT/ML injection Inject 62 Units into the skin See Admin Instructions. Take 62 units in the morning  and 27 units in the evening       . Insulin Syringe-Needle U-100 (INSULIN SYRINGE 1CC/30GX5/16") 30G X 5/16" 1 ML MISC 1 Unit by Does not apply route 2 (two) times daily.  100 each  2   . lisinopril (PRINIVIL,ZESTRIL) 40 MG tablet Take 1 tablet (40 mg total) by mouth daily.  30 tablet  2   . metFORMIN (GLUCOPHAGE) 1000 MG tablet Take 1 tablet (1,000 mg total) by mouth 2 (two) times daily with meals.  60 tablet  2   . pravastatin (PRAVACHOL) 20 MG tablet Take 1 tablet (20 mg total) by mouth daily.  30 tablet  2   . [DISCONTINUED] insulin NPH-insulin regular (HUMULIN,NOVOLIN 70/30) (70-30) 100 UNIT/ML injection Inject 62 Units into the skin See Admin Instructions. Take 57 units in the morning and 27 units in the evening  50 mL  2       Review of Systems        Objective:    Physical Exam   Vitals reviewed.  Constitutional: She is oriented to person, place, and time. She appears well-developed. No distress.        Obese     HENT:        Moist oral mucous membranes   Eyes: Conjunctivae normal are normal. Right eye exhibits no discharge. Left eye exhibits no discharge. No scleral icterus.   Neck: Neck supple.   Cardiovascular: Normal rate and regular rhythm.  Exam reveals no gallop and no friction rub.    No murmur heard.  Pulmonary/Chest: Effort normal. No respiratory distress. She has no wheezes. She has no rales.   Abdominal: Soft. There is no tenderness. There is no rebound and no guarding.        Obese/protuberant abdomen   Musculoskeletal:        +1 bilateral lower extremity edema   Neurological: She is alert and oriented to person, place, and time.   Skin: She is not diaphoretic.        +varicose veins on bilateral lower extremities  Changes consistent w/ venous stasis bilaterally   Psychiatric: Her behavior is normal.     BP 149/69  Pulse 76  Temp 98.4 F (36.9 C) (Oral)  Resp 16  Ht 1.651 m (5\' 5" )  Wt 94.518 kg (208 lb 6 oz)  BMI 34.68 kg/m2    Lab Results   Component Value  Date    HGBA1C 7.9* 09/27/2012      Lab Results   Component Value Date    WBC 9.94 02/17/2011    HGB 14.0 02/17/2011    HCT 40.6 02/17/2011    MCV 89.8 02/17/2011    PLT 237 02/17/2011     '  Chemistry        Component Value Date/Time    NA 140 09/27/2012 0801    K 5.3* 09/27/2012 0801    CL 100 09/27/2012 0801    CO2 26 09/27/2012 0801    BUN 11 09/27/2012 0801    CREAT 0.55* 09/27/2012 0801    CREAT 0.7 02/17/2011 0410    GLU 149* 09/27/2012 0801    GLU 199* 02/17/2011 0410        Component Value Date/Time    CA 9.1 09/27/2012 0801    ALKPHOS 120* 09/27/2012 0801    AST 43* 09/27/2012 0801    ALT 48* 09/27/2012 0801    BILITOTAL 0.3 09/27/2012 0801        Lab Results   Component Value Date    CHOL 170 06/06/2012    CHOL 163 02/18/2011     Lab Results   Component Value Date    HDL 47 06/06/2012    HDL 54 02/18/2011     Lab Results   Component Value Date    LDL 104* 06/06/2012    LDL 84 02/18/2011     Lab Results   Component Value Date    TRIG 95 06/06/2012    TRIG 126 02/18/2011           Assessment:       1. DM (diabetes mellitus), type 2  Comprehensive metabolic panel    Hemoglobin A1C    glipiZIDE XL (GLUCOTROL XL) 5 MG 24 hr tablet    insulin NPH-insulin regular (HUMULIN,NOVOLIN 70/30) (70-30) 100 UNIT/ML injection    metFORMIN (GLUCOPHAGE) 1000 MG tablet    Insulin Syringe-Needle U-100 (INSULIN SYRINGE 1CC/30GX5/16") 30G X 5/16" 1 ML Misc   2. Hypertension  Comprehensive metabolic panel    amLODIPine (NORVASC) 10 MG tablet    atenolol (TENORMIN) 50 MG tablet    lisinopril (PRINIVIL,ZESTRIL) 40 MG tablet    hydrochlorothiazide (HYDRODIURIL) 25 MG tablet    DISCONTINUED: hydrochlorothiazide (HYDRODIURIL) 12.5 MG tablet   3. Hyperlipidemia  Comprehensive metabolic panel    pravastatin (PRAVACHOL) 20 MG tablet   4. Need for influenza vaccination  Flu vaccine QUAD 3 yrs & greater           Plan:       Diabetes mellitus 2 - poorly Controlled on last check, however it appears that her current blood sugar readings her diabetes is under better control for which we will change  Glucotrol 10 mg daily 2 Glucotrol XL 5 mg daily and continue current metformin 1000 mg 2 times a day and also Novolin 70-30 62 units in the morning and 27 units in the evening.  We have encouraged her to continue to monitor her blood sugars are 2-3 times a day prior to meals and at times at bedtime, with goal pre-meal blood sugars under 120.    Hypertension - suboptimally controlled.  We will increase the dose of her hydrochlorothiazide from 12.5-25 mg daily and continue current amlodipine 10 mg daily, atenolol 50 mg daily, and lisinopril 40 mg daily.  We did encourage patient to work on weight loss and also continue with regular exercise and low salt diet.We advise patient  to continue to monitor her blood pressures at home 3-4 days a week, with goal blood pressure under 130/80 mmHg.  If her blood pressures are not at goal over the next 2-4 weeks, and she will return to clinic sooner.    Hyperlipidemia - patient is not fasting today for which we did not check a lipid panel but we have advised her that during her next visit she should come by fasting so that we can check her lipid panel.  We will check the above chemistries and, for now, she will continue current pravastatin 20 mg daily.    She will continue followup with the pain specialist as scheduled.    Influenza vaccine was administered today in clinic.    She will return to clinic in 3 months for followup of diabetes, hypertension, and hyperlipidemia, sooner as needed.

## 2012-12-28 NOTE — Telephone Encounter (Signed)
[name] from [facility] called regarding two meds that were sent in today.  Please call her at [phone]    For hydrochlorothiazide (HYDRODIURIL) 12.5 MG tablet , they don't have tablet, just capsule. And they want to know if it is okay.  Second, for Insulin Syringe-Needle U-100 (INSULIN SYRINGE 1CC/30GX5/16") 30G X 5/16" 1 ML , patient used to get 31G not 30G.  So please give the pharmacist a call.    Thanks    [employer]

## 2012-12-29 LAB — COMPREHENSIVE METABOLIC PANEL
ALT: 63 U/L — ABNORMAL HIGH (ref 6–29)
AST (SGOT): 71 U/L — ABNORMAL HIGH (ref 10–35)
Albumin/Globulin Ratio: 1.2 (ref 1.0–2.5)
Albumin: 3.9 G/DL (ref 3.6–5.1)
Alkaline Phosphatase: 93 U/L (ref 33–130)
BUN: 25 MG/DL (ref 7–25)
Bilirubin, Total: 0.4 MG/DL (ref 0.2–1.2)
CO2: 25 mmol/L (ref 19–30)
Calcium: 9 MG/DL (ref 8.6–10.4)
Chloride: 104 mmol/L (ref 98–110)
Creatinine: 0.78 mg/dL (ref 0.50–1.05)
EGFR African American: 98 mL/min/{1.73_m2} (ref >=60)
EGFR: 84 mL/min/{1.73_m2} (ref >=60)
Globulin: 3.3 G/DL (ref 1.9–3.7)
Glucose: 182 MG/DL — ABNORMAL HIGH (ref 65–99)
Potassium: 4.6 mmol/L (ref 3.5–5.3)
Protein, Total: 7.2 G/DL (ref 6.1–8.1)
Sodium: 139 mmol/L (ref 135–146)

## 2012-12-29 LAB — HEMOGLOBIN A1C: Hemoglobin A1C: 9 % — ABNORMAL HIGH (ref <5.7)

## 2013-01-08 ENCOUNTER — Encounter (INDEPENDENT_AMBULATORY_CARE_PROVIDER_SITE_OTHER): Payer: Self-pay

## 2013-01-08 NOTE — Progress Notes (Signed)
I have noted the patient's increase in A1c.    Results for Miranda, [name] (MRN [id number]) as of 01/08/2013 12:13   Ref. Range 09/27/2012 08:01 12/28/2012 08:45   Hemoglobin A1C Latest Range: <5.7 % 7.9 (H) 9.0 (H)       I have tried to call the patient at [phone] with Spanish Interpreter [phone] to discuss DM further but no voicemail.

## 2013-01-18 ENCOUNTER — Encounter (INDEPENDENT_AMBULATORY_CARE_PROVIDER_SITE_OTHER): Payer: Self-pay | Admitting: Internal Medicine

## 2013-01-18 ENCOUNTER — Ambulatory Visit (INDEPENDENT_AMBULATORY_CARE_PROVIDER_SITE_OTHER): Payer: BC Managed Care – PPO | Admitting: Internal Medicine

## 2013-01-18 VITALS — BP 120/80 | HR 67 | Temp 98.4°F | Resp 16 | Ht 65.0 in

## 2013-01-18 DIAGNOSIS — R059 Cough, unspecified: Secondary | ICD-10-CM

## 2013-01-18 DIAGNOSIS — J069 Acute upper respiratory infection, unspecified: Secondary | ICD-10-CM

## 2013-01-18 MED ORDER — BENZONATATE 100 MG PO CAPS
100.0000 mg | ORAL_CAPSULE | Freq: Three times a day (TID) | ORAL | Status: DC | PRN
Start: 2013-01-18 — End: 2013-03-29

## 2013-01-18 MED ORDER — AZITHROMYCIN 250 MG PO TABS
ORAL_TABLET | ORAL | Status: AC
Start: 2013-01-18 — End: 2013-01-23

## 2013-01-18 NOTE — Patient Instructions (Addendum)
Aumente insulina de por la manana de 62 unidades a 68 unidades  Aumente insulina de por la tarde de 27 unidades a 32 unidades        MEDICATION: AZITHROMYCIN  You have been prescribed azithromycin (brand: Zithromax), an antibiotic that treats bacterial infections.  DIRECTIONS FOR USE:  Azithromycin can be taken with or without food. Take it with a full glass of water.If you find that it causes nausea when taken on an empty stomach, take it with food.  Take all of the medicine until it is gone, even if you are feeling better. This will ensure that your infection is fully treated.  WHAT TO WATCH FOR:  POSSIBLE SIDE EFFECTS: Nausea, vomiting, stomach pain or diarrhea (Take with food and contact your doctor if any of these symptoms persist or become severe).  ALLERGIC REACTION: Rash, itching, swelling, trouble breathing or swallowing (Contact your doctor or return to this facility promptly).  MEDICAL CONDITIONS: Before starting this medicine, be sure your doctor knows if you have any of the following conditions:   Allergic reaction to erythromycin or Biaxin (clarithromycin)   Liver or kidney disease   Breastfeeding  DRUG INTERACTIONS: Before starting this medicine, be sure your doctor knows if you are taking any of the following drugs:   Lanoxin (digoxin), phenothiazines (such as chlorpromazine, promethazine), Coumadin (warfarin), Ranexa (ranolazine), Cordarone (amiodarone)   Antacids: take 2 hours before or after azithromycin  [NOTE: This information topic may not include all directions, precautions, medical conditions, drug/food interactions, and warnings for this drug. Check with your doctor, nurse, or pharmacist for any questions that you may have.]   [address], [address], [place], [place] [address]. All rights reserved. This information is not intended as a substitute for professional medical care. Always follow your healthcare professional's instructions.

## 2013-01-18 NOTE — Progress Notes (Signed)
Subjective:       Patient ID: [name] is a 57 y.o. female.  Encounter was performed in Spanish  Chief Complaint   Patient presents with   . Cough     x 6 days.       HPI  Patient is a 57 year old Hispanic female with history of obesity, poorly controlled diabetes mellitus 2, hypertension, hyperlipidemia, fatty liver who presents today for evaluation of a cough.    Patient reports that approximately 6 days ago she started experiencing generalized malaise and then approximately 3 days ago she started experiencing a dry cough and a sensation of dryness in her throat. She does admit she has been having some rhinorrhea but denies any nasal or sinus congestion, ear pain, shortness of breath, wheezing, nausea, vomiting, diarrhea, rashes. She denies any sick contacts. She has not been taking anything for her symptoms.    Of note, her last hemoglobin A1c was 9% on her last check one month ago. She reports compliance with her insulin and also with her oral diabetic medications.  She has been measuring her blood sugars 2 times a day with fasting blood sugars usually in the 80s to 90s and evening blood sugars in the 190s or so.    The following portions of the patient's history were reviewed and updated as appropriate: allergies, current medications, past medical history, past social history and problem list.    Past Medical History   Diagnosis Date   . Glaucoma 06/01/2012   . Diabetes mellitus type 2 in obese    . Hypertension    . Hyperlipidemia    . Obesity        History     Social History   . Marital Status: Single     Spouse Name: N/A     Number of Children: 3   . Years of Education: N/A     Occupational History   . Maintenance      Social History Main Topics   . Smoking status: Never Smoker    . Smokeless tobacco: Never Used   . Alcohol Use: No   . Drug Use: No   . Sexually Active: Not on file     Other Topics Concern   . Not on file     Social History Narrative    Originally from [place].  Lives with her  daughter.Exercise: walks frequently       No Known Allergies    Current Outpatient Prescriptions   Medication Sig Dispense Refill   . amLODIPine (NORVASC) 10 MG tablet Take 1 tablet (10 mg total) by mouth daily.  30 tablet  3   . aspirin 81 MG tablet Take 81 mg by mouth daily.         Marland Kitchen atenolol (TENORMIN) 50 MG tablet Take 1 tablet (50 mg total) by mouth daily.  30 tablet  3   . bimatoprost (LUMIGAN) 0.03 % ophthalmic drops Place 1 drop into both eyes nightly.  2.5 mL  6   . Calcium Carbonate-Vitamin D (CALCIUM-D PO) Take 1 tablet by mouth daily.       Marland Kitchen glipiZIDE XL (GLUCOTROL XL) 5 MG 24 hr tablet Take 1 tablet (5 mg total) by mouth daily.  30 tablet  3   . hydrochlorothiazide (HYDRODIURIL) 25 MG tablet Take 1 tablet (25 mg total) by mouth daily.  30 tablet  2   . insulin NPH-insulin regular (HUMULIN,NOVOLIN 70/30) (70-30) 100 UNIT/ML injection Inject 62 Units into the skin  See Admin Instructions. Take 62 units in the morning and 27 units in the evening  30 mL  2   . Insulin Syringe-Needle U-100 (INSULIN SYRINGE 1CC/30GX5/16") 30G X 5/16" 1 ML Misc 1 Unit by Does not apply route 2 (two) times daily.  100 each  2   . lisinopril (PRINIVIL,ZESTRIL) 40 MG tablet Take 1 tablet (40 mg total) by mouth daily.  30 tablet  3   . metFORMIN (GLUCOPHAGE) 1000 MG tablet Take 1 tablet (1,000 mg total) by mouth 2 (two) times daily with meals.  60 tablet  3   . pravastatin (PRAVACHOL) 20 MG tablet Take 1 tablet (20 mg total) by mouth daily.  30 tablet  3         Review of Systems  As per history of present illness      Objective:    Physical Exam   Vitals reviewed.  Constitutional: She is oriented to person, place, and time. She appears well-developed. No distress.        obese   HENT:   Nose: Mucosal edema present. Right sinus exhibits no maxillary sinus tenderness and no frontal sinus tenderness. Left sinus exhibits no maxillary sinus tenderness and no frontal sinus tenderness.   Mouth/Throat: No oropharyngeal exudate (Mild  pharyngeal erythema without exudates).        Moist oral mucous membranes   Eyes: Conjunctivae normal are normal. Right eye exhibits no discharge. Left eye exhibits no discharge. No scleral icterus.   Cardiovascular: Normal rate and regular rhythm.  Exam reveals no gallop and no friction rub.    No murmur heard.  Pulmonary/Chest: Effort normal. No respiratory distress. She has no wheezes. She has no rales.   Neurological: She is alert and oriented to person, place, and time.   Skin: Skin is warm and dry. She is not diaphoretic.     BP 120/80  Pulse 67  Temp 98.4 F (36.9 C) (Oral)  Resp 16  Ht 1.651 m (5\' 5" )    Lab Results   Component Value Date    HGBA1C 9.0* 12/28/2012     Lab Results   Component Value Date    WBC 9.94 02/17/2011    HGB 14.0 02/17/2011    HCT 40.6 02/17/2011    MCV 89.8 02/17/2011    PLT 237 02/17/2011     '  Chemistry        Component Value Date/Time    NA 139 12/28/2012 0845    K 4.6 12/28/2012 0845    CL 104 12/28/2012 0845    CL 100 09/27/2012 0801    CO2 25 12/28/2012 0845    BUN 25 12/28/2012 0845    CREAT 0.78 12/28/2012 0845    CREAT 0.55* 09/27/2012 0801    CREAT 0.7 02/17/2011 0410    GLU 182* 12/28/2012 0845    GLU 149* 09/27/2012 0801        Component Value Date/Time    CA 9.0 12/28/2012 0845    ALKPHOS 93 12/28/2012 0845    AST 71* 12/28/2012 0845    ALT 63* 12/28/2012 0845    BILITOTAL 0.4 12/28/2012 0845              Assessment:       1. URI (upper respiratory infection)  azithromycin (ZITHROMAX Z-PAK) 250 MG tablet   2. Cough  benzonatate (TESSALON PERLES) 100 MG capsule           Plan:       Discussed with  patient that her symptoms are most likely secondary to a URI for which I recommend supportive care measures such as increased hydration, over-the-counter lozenges such as Cepacol as needed for sore throat, and she may also use Tessalon Perles one capsule every 8 hours as needed for cough suppression.    If her cough does not improve over the next 48 hours and she will begin therapy  with azithromycin 500 mg a day #1 and 250 mg for 4 days thereafter.    In regards to her poorly controlled diabetes, She will increase the dose of her insulin NPH-insulin regular and 62 units in the morning to 68 units in the morning and also from 27 units in the evening to 32 units in the evening. She will continue metformin and glipizide.    Risk & Benefits of the new medication(s) were explained to the patient (and family) who verbalized understanding & agreed to the treatment plan. Patient (family) encouraged to contact me/clinical staff with any questions/concerns    She will return to clinic if there is no improvement in her symptoms over the next 5-7 days, sooner as needed.

## 2013-01-29 ENCOUNTER — Encounter (INDEPENDENT_AMBULATORY_CARE_PROVIDER_SITE_OTHER): Payer: Self-pay

## 2013-01-29 NOTE — Progress Notes (Signed)
I have left a message for the patient at [phone] with Spanish Interpreter [phone] to obtain an update and discuss DM further.

## 2013-03-11 ENCOUNTER — Other Ambulatory Visit (INDEPENDENT_AMBULATORY_CARE_PROVIDER_SITE_OTHER): Payer: Self-pay

## 2013-03-11 DIAGNOSIS — E119 Type 2 diabetes mellitus without complications: Secondary | ICD-10-CM

## 2013-03-12 ENCOUNTER — Encounter (INDEPENDENT_AMBULATORY_CARE_PROVIDER_SITE_OTHER): Payer: Self-pay

## 2013-03-12 MED ORDER — INSULIN NPH ISOPHANE & REGULAR (70-30) 100 UNIT/ML SC SUSP
62.0000 [IU] | SUBCUTANEOUS | Status: DC
Start: 2013-03-11 — End: 2013-06-28

## 2013-03-12 NOTE — Telephone Encounter (Signed)
Rx for Humulin 70/30 sent to preferred pharmacy

## 2013-03-27 ENCOUNTER — Encounter (INDEPENDENT_AMBULATORY_CARE_PROVIDER_SITE_OTHER): Payer: Self-pay

## 2013-03-27 NOTE — Progress Notes (Signed)
I have tried to leave the patient a message at [phone] with Spanish Interpreter [phone] but no answer.  We then left a message for the patient at [phone] to remind her about the appointment with Dr. Charlesetta Ivory on 03/29/13 at 0730 and to take in meter and logs so she can assess her further.

## 2013-03-29 ENCOUNTER — Ambulatory Visit (INDEPENDENT_AMBULATORY_CARE_PROVIDER_SITE_OTHER): Payer: HMO | Admitting: Internal Medicine

## 2013-03-29 ENCOUNTER — Telehealth (INDEPENDENT_AMBULATORY_CARE_PROVIDER_SITE_OTHER): Payer: Self-pay

## 2013-03-29 ENCOUNTER — Encounter (INDEPENDENT_AMBULATORY_CARE_PROVIDER_SITE_OTHER): Payer: Self-pay | Admitting: Internal Medicine

## 2013-03-29 ENCOUNTER — Other Ambulatory Visit (INDEPENDENT_AMBULATORY_CARE_PROVIDER_SITE_OTHER): Payer: Self-pay | Admitting: Internal Medicine

## 2013-03-29 VITALS — BP 154/74 | HR 75 | Temp 98.1°F | Ht 65.0 in | Wt 205.0 lb

## 2013-03-29 DIAGNOSIS — E119 Type 2 diabetes mellitus without complications: Secondary | ICD-10-CM

## 2013-03-29 DIAGNOSIS — Z1239 Encounter for other screening for malignant neoplasm of breast: Secondary | ICD-10-CM

## 2013-03-29 DIAGNOSIS — I10 Essential (primary) hypertension: Secondary | ICD-10-CM

## 2013-03-29 DIAGNOSIS — E785 Hyperlipidemia, unspecified: Secondary | ICD-10-CM

## 2013-03-29 MED ORDER — INSULIN SYRINGE 30G X 5/16" 1 ML MISC
Status: DC
Start: 2013-03-29 — End: 2013-06-28

## 2013-03-29 MED ORDER — GLIPIZIDE ER 5 MG PO TB24
5.0000 mg | ORAL_TABLET | Freq: Every day | ORAL | Status: DC
Start: 2013-03-29 — End: 2013-03-29

## 2013-03-29 MED ORDER — INSULIN NPH ISOPHANE & REGULAR (70-30) 100 UNIT/ML SC SUSP
SUBCUTANEOUS | Status: DC
Start: 2013-03-29 — End: 2013-06-10

## 2013-03-29 MED ORDER — LISINOPRIL 40 MG PO TABS
40.0000 mg | ORAL_TABLET | Freq: Every day | ORAL | Status: DC
Start: 2013-03-29 — End: 2013-03-29

## 2013-03-29 MED ORDER — HYDROCHLOROTHIAZIDE 25 MG PO TABS
25.0000 mg | ORAL_TABLET | Freq: Every day | ORAL | Status: DC
Start: 2013-03-29 — End: 2013-03-29

## 2013-03-29 MED ORDER — INSULIN SYRINGE 30G X 5/16" 1 ML MISC
Status: DC
Start: 2013-03-29 — End: 2013-03-29

## 2013-03-29 MED ORDER — PRAVASTATIN SODIUM 20 MG PO TABS
20.0000 mg | ORAL_TABLET | Freq: Every day | ORAL | Status: DC
Start: 2013-03-29 — End: 2013-06-28

## 2013-03-29 MED ORDER — HYDROCHLOROTHIAZIDE 25 MG PO TABS
25.0000 mg | ORAL_TABLET | Freq: Every day | ORAL | Status: DC
Start: 2013-03-29 — End: 2013-05-27

## 2013-03-29 MED ORDER — ATENOLOL 50 MG PO TABS
50.0000 mg | ORAL_TABLET | Freq: Every day | ORAL | Status: DC
Start: 2013-03-29 — End: 2013-06-28

## 2013-03-29 MED ORDER — PRAVASTATIN SODIUM 20 MG PO TABS
20.0000 mg | ORAL_TABLET | Freq: Every day | ORAL | Status: DC
Start: 2013-03-29 — End: 2013-03-29

## 2013-03-29 MED ORDER — GLIPIZIDE ER 5 MG PO TB24
5.0000 mg | ORAL_TABLET | Freq: Every day | ORAL | Status: DC
Start: 2013-03-29 — End: 2013-04-29

## 2013-03-29 MED ORDER — DILTIAZEM HCL ER COATED BEADS 240 MG PO CP24
240.0000 mg | ORAL_CAPSULE | Freq: Every day | ORAL | Status: DC
Start: 2013-03-29 — End: 2013-03-29

## 2013-03-29 MED ORDER — INSULIN NPH ISOPHANE & REGULAR (70-30) 100 UNIT/ML SC SUSP
SUBCUTANEOUS | Status: DC
Start: 2013-03-29 — End: 2013-03-29

## 2013-03-29 MED ORDER — DILTIAZEM HCL ER COATED BEADS 240 MG PO CP24
240.0000 mg | ORAL_CAPSULE | Freq: Every day | ORAL | Status: DC
Start: 2013-03-29 — End: 2013-06-25

## 2013-03-29 MED ORDER — LISINOPRIL 40 MG PO TABS
40.0000 mg | ORAL_TABLET | Freq: Every day | ORAL | Status: DC
Start: 2013-03-29 — End: 2013-06-28

## 2013-03-29 MED ORDER — METFORMIN HCL 1000 MG PO TABS
1000.0000 mg | ORAL_TABLET | Freq: Two times a day (BID) | ORAL | Status: DC
Start: 2013-03-29 — End: 2013-06-25

## 2013-03-29 MED ORDER — METFORMIN HCL 1000 MG PO TABS
1000.0000 mg | ORAL_TABLET | Freq: Two times a day (BID) | ORAL | Status: DC
Start: 2013-03-29 — End: 2013-03-29

## 2013-03-29 MED ORDER — ATENOLOL 50 MG PO TABS
50.0000 mg | ORAL_TABLET | Freq: Every day | ORAL | Status: DC
Start: 2013-03-29 — End: 2013-03-29

## 2013-03-29 NOTE — Progress Notes (Signed)
1. Have you referred yourself since we last saw you?no

## 2013-03-29 NOTE — Progress Notes (Signed)
Subjective:       Patient ID: [name] is a 58 y.o. female.  Encounter was performed in Spanish.  Chief Complaint   Patient presents with   . Follow-up   . Diabetes   Hypertension  hyperlipidemia    HPI  Patient is a 58 year old Hispanic female with history of obesity, diabetes mellitus 2, hypertension, hyperlipidemia, glaucoma among others she presents today for followup of the above.  She does not have any new complaints today.    Diabetes mellitus 2 - plus hemoglobin A1c close to 3 months ago was elevated at 9%.  Patient reports compliance with insulin and glipizide.  She has been checking her blood sugars fasting should be running in the 90s to 100 100s.  Her pre-bedtime blood sugars have been running in the 190s.  She denies any polydipsia, polyuria, or any worsening extremity numbness or tingling.  She checks her feet regularly.    Hypertension - reports compliance with all antihypertensive medications.  She reports she checks her blood sugars at home and these have been running in the 150s systolic and 70s diastolic.  She denies any frequent headaches, new visual changes, chest pain, palpitations, shortness of breath, dyspnea on exertion.    Hyperlipidemia - the rest of the panel done April 2014 showed acceptable control.  She reports compliance with pravastatin 20 mg daily and denies any myalgias or abdominal pain.      The following portions of the patient's history were reviewed and updated as appropriate: allergies, current medications, past medical history, past social history and problem list.    Past Medical History   Diagnosis Date   . Glaucoma 06/01/2012   . Diabetes mellitus type 2 in obese    . Hypertension    . Hyperlipidemia    . Obesity        History     Social History   . Marital Status: Single     Spouse Name: N/A     Number of Children: 3   . Years of Education: N/A     Occupational History   . Maintenance      Social History Main Topics   . Smoking status: Never Smoker    .  Smokeless tobacco: Never Used   . Alcohol Use: No   . Drug Use: No   . Sexually Active: Not on file     Other Topics Concern   . Not on file     Social History Narrative    Originally from [place].  Lives with her daughter.Exercise: walks frequently       No Known Allergies    Current Outpatient Prescriptions   Medication Sig Dispense Refill   . aspirin 81 MG tablet Take 81 mg by mouth daily.         Marland Kitchen atenolol (TENORMIN) 50 MG tablet Take 1 tablet (50 mg total) by mouth daily.  90 tablet  0   . bimatoprost (LUMIGAN) 0.03 % ophthalmic drops Place 1 drop into both eyes nightly.  2.5 mL  6   . Calcium Carbonate-Vitamin D (CALCIUM-D PO) Take 1 tablet by mouth daily.       Marland Kitchen glipiZIDE XL (GLUCOTROL XL) 5 MG 24 hr tablet Take 1 tablet (5 mg total) by mouth daily.  30 tablet  0   . hydrochlorothiazide (HYDRODIURIL) 25 MG tablet Take 1 tablet (25 mg total) by mouth daily.  90 tablet  0   . insulin NPH-insulin regular (HUMULIN,NOVOLIN 70/30) (70-30) 100  UNIT/ML injection Take 68 units subcutaneously in the morning and 32 units in the evening  30 mL  0   . Insulin Syringe-Needle U-100 (INSULIN SYRINGE 1CC/30GX5/16") 30G X 5/16" 1 ML Misc Use syringe for insulin injection 4 times a day  100 each  1   . lisinopril (PRINIVIL,ZESTRIL) 40 MG tablet Take 1 tablet (40 mg total) by mouth daily.  90 tablet  0   . metFORMIN (GLUCOPHAGE) 1000 MG tablet Take 1 tablet (1,000 mg total) by mouth 2 (two) times daily with meals.  180 tablet  0   . pravastatin (PRAVACHOL) 20 MG tablet Take 1 tablet (20 mg total) by mouth daily.  30 tablet  3   . [DISCONTINUED] insulin NPH-insulin regular (HUMULIN,NOVOLIN 70/30) (70-30) 100 UNIT/ML injection Inject 62 Units into the skin See Admin Instructions. Take 62 units in the morning and 27 units in the evening  30 mL  0   . diltiazem (CARTIA XT) 240 MG 24 hr capsule Take 1 capsule (240 mg total) by mouth daily.  30 capsule  2       Review of Systems   Respiratory: Negative for chest tightness and  shortness of breath.    Cardiovascular: Negative for chest pain and palpitations.   Gastrointestinal: Negative for nausea, vomiting, abdominal pain, diarrhea and constipation.   Genitourinary: Negative for dysuria, hematuria and difficulty urinating.   Neurological: Negative for headaches.   Psychiatric/Behavioral: Negative for dysphoric mood. The patient is not nervous/anxious.      Rest as per history of present illness      Objective:    Physical Exam   Vitals reviewed.  Constitutional: She is oriented to person, place, and time. She appears well-developed. No distress.        obese   HENT:        Moist oral mucous membranes   Eyes: Conjunctivae normal are normal. Right eye exhibits no discharge. Left eye exhibits no discharge. No scleral icterus.   Cardiovascular: Normal rate and regular rhythm.  Exam reveals no gallop and no friction rub.    No murmur heard.  Pulmonary/Chest: Effort normal. No respiratory distress. She has no wheezes. She has no rales.   Abdominal: Soft. There is no tenderness. There is no rebound and no guarding.   Musculoskeletal: Edema: trace bilateral lower extremity edema at ankles.   Neurological: She is oriented to person, place, and time.   Skin: Skin is warm and dry. She is not diaphoretic.   Psychiatric: Her behavior is normal.     BP 154/74  Pulse 75  Temp 98.1 F (36.7 C) (Oral)  Ht 1.651 m (5\' 5" )  Wt 92.987 kg (205 lb)  BMI 34.11 kg/m2      Lab Results   Component Value Date    HGBA1C 9.0* 12/28/2012     Lab Results   Component Value Date    CHOL 174 03/29/2013    CHOL 170 06/06/2012    CHOL 163 02/18/2011     Lab Results   Component Value Date    HDL 69 03/29/2013    HDL 47 06/06/2012    HDL 54 02/18/2011     Lab Results   Component Value Date    LDL 87 03/29/2013    LDL 104* 06/06/2012    LDL 84 02/18/2011     Lab Results   Component Value Date    TRIG 92 03/29/2013    TRIG 95 06/06/2012    TRIG 126 02/18/2011  Assessment:       1. DM (diabetes mellitus), type 2   Comprehensive metabolic panel    Hemoglobin A1C    metFORMIN (GLUCOPHAGE) 1000 MG tablet    Insulin Syringe-Needle U-100 (INSULIN SYRINGE 1CC/30GX5/16") 30G X 5/16" 1 ML Misc    insulin NPH-insulin regular (HUMULIN,NOVOLIN 70/30) (70-30) 100 UNIT/ML injection    glipiZIDE XL (GLUCOTROL XL) 5 MG 24 hr tablet    DISCONTINUED: metFORMIN (GLUCOPHAGE) 1000 MG tablet    DISCONTINUED: Insulin Syringe-Needle U-100 (INSULIN SYRINGE 1CC/30GX5/16") 30G X 5/16" 1 ML Misc    DISCONTINUED: insulin NPH-insulin regular (HUMULIN,NOVOLIN 70/30) (70-30) 100 UNIT/ML injection    DISCONTINUED: glipiZIDE XL (GLUCOTROL XL) 5 MG 24 hr tablet   2. Hypertension  Comprehensive metabolic panel    lisinopril (PRINIVIL,ZESTRIL) 40 MG tablet    hydrochlorothiazide (HYDRODIURIL) 25 MG tablet    diltiazem (CARTIA XT) 240 MG 24 hr capsule    atenolol (TENORMIN) 50 MG tablet    DISCONTINUED: diltiazem (CARTIA XT) 240 MG 24 hr capsule    DISCONTINUED: lisinopril (PRINIVIL,ZESTRIL) 40 MG tablet    DISCONTINUED: hydrochlorothiazide (HYDRODIURIL) 25 MG tablet    DISCONTINUED: atenolol (TENORMIN) 50 MG tablet   3. Hyperlipidemia  Lipid panel    pravastatin (PRAVACHOL) 20 MG tablet    DISCONTINUED: pravastatin (PRAVACHOL) 20 MG tablet   4. Breast cancer screening  Mammo Digital Screening Bilateral W Cad           Plan:        diabetes mellitus 2- poorly controlled as evidenced by a hemoglobin A1c..  We recommended that patient consider changing the type of insulin but she is hesitant at this time and would like to see where her current A1c shows.  We did advise her to lose weight and also exercise regularly as tolerated.  She is aware she should have an annual eye exam and also check her feet regularly.  For now she will continue her current diabetes regimen.    Hypertension - suboptimally controlled with elevated systolic blood pressure.for now patient will continue current lisinopril, hydrochlorothiazide, atenolol.  We will begin therapy with diltiazem  240 mg daily.  She will continue to monitor her blood pressures at home 3-4 days a week, with goal blood pressure of under 130/80 mmHg.    Hyperlipidemia - well-controlled on last check.  She will continue current pravastatin 20 mg daily.  Advised patient to follow diet low in saturated fat.    As per patient's request, we have given her an order for routine breast cancer screening mammogram    ..  She will return to clinic in 3 months for followup of diabetes and hypertension or sooner as needed.

## 2013-03-29 NOTE — Telephone Encounter (Signed)
Pharmacy notified to cancel all scripts sent today per Dr. Yancey Flemings.

## 2013-03-30 LAB — COMPREHENSIVE METABOLIC PANEL
ALT: 39 U/L — ABNORMAL HIGH (ref 6–29)
AST (SGOT): 37 U/L — ABNORMAL HIGH (ref 10–35)
Albumin/Globulin Ratio: 1.3 (ref 1.0–2.5)
Albumin: 4 G/DL (ref 3.6–5.1)
Alkaline Phosphatase: 79 U/L (ref 33–130)
BUN: 19 MG/DL (ref 7–25)
Bilirubin, Total: 0.5 MG/DL (ref 0.2–1.2)
CO2: 26 mmol/L (ref 19–30)
Calcium: 9.2 MG/DL (ref 8.6–10.4)
Chloride: 103 mmol/L (ref 98–110)
Creatinine: 0.8 mg/dL (ref 0.50–1.05)
EGFR African American: 95 mL/min/{1.73_m2} (ref >=60)
EGFR: 82 mL/min/{1.73_m2} (ref >=60)
Globulin: 3.2 G/DL (ref 1.9–3.7)
Glucose: 135 MG/DL — ABNORMAL HIGH (ref 65–99)
Potassium: 4.5 mmol/L (ref 3.5–5.3)
Protein, Total: 7.2 G/DL (ref 6.1–8.1)
Sodium: 138 mmol/L (ref 135–146)

## 2013-03-30 LAB — HEMOGLOBIN A1C: Hemoglobin A1C: 8.3 % — ABNORMAL HIGH (ref <5.7)

## 2013-03-30 LAB — LIPID PANEL
Cholesterol / HDL Ratio: 2.5 (ref 0.0–5.0)
Cholesterol: 174 MG/DL (ref 125–200)
HDL: 69 mg/dL (ref >=46)
LDL Calculated: 87 mg/dL (ref <130)
Non HDL Cholesterol (LDL and VLDL): 105 mg/dL
Triglycerides: 92 MG/DL (ref <150)

## 2013-04-02 ENCOUNTER — Encounter (INDEPENDENT_AMBULATORY_CARE_PROVIDER_SITE_OTHER): Payer: Self-pay

## 2013-04-02 NOTE — Progress Notes (Signed)
I have noted the decrease in A1c from 9 on 12/28/12 to 8.3 on 03/29/13.    I have tried to call the patient at [phone] with Spanish Interpreter [phone] but no answer.  The patient was then called at [phone] and a message was left.  I have acknowledged the improvement in the [name] lab.  If she wishes to discuss further I have asked her to give me a call back.

## 2013-04-03 ENCOUNTER — Telehealth (INDEPENDENT_AMBULATORY_CARE_PROVIDER_SITE_OTHER): Payer: Self-pay | Admitting: Internal Medicine

## 2013-04-03 NOTE — Telephone Encounter (Signed)
Pt called wanting to know if you were going to prescribe diltiazem (CARTIA XT) 240 MG 24 hr capsule to her pharmacy.  She said that you told her you would notify her once her lab results came in if she was to start taking it. Pt seems a little confused and would like to speak with you. She can be reached at home today at [phone].    Thanks,  Lafonda Mosses

## 2013-04-04 NOTE — Telephone Encounter (Signed)
Called patient back yesterday.  BP continues to be elevated.  Advised her to begin diltiazem as prescribed during recent clinic visit in additions to other antihypertensives.

## 2013-04-29 ENCOUNTER — Other Ambulatory Visit (INDEPENDENT_AMBULATORY_CARE_PROVIDER_SITE_OTHER): Payer: Self-pay

## 2013-04-29 DIAGNOSIS — E119 Type 2 diabetes mellitus without complications: Secondary | ICD-10-CM

## 2013-04-29 MED ORDER — GLIPIZIDE ER 5 MG PO TB24
5.0000 mg | ORAL_TABLET | Freq: Every day | ORAL | Status: DC
Start: 2013-04-29 — End: 2013-06-28

## 2013-05-27 ENCOUNTER — Ambulatory Visit (INDEPENDENT_AMBULATORY_CARE_PROVIDER_SITE_OTHER): Payer: HMO | Admitting: Internal Medicine

## 2013-05-27 ENCOUNTER — Encounter (INDEPENDENT_AMBULATORY_CARE_PROVIDER_SITE_OTHER): Payer: Self-pay | Admitting: Internal Medicine

## 2013-05-27 VITALS — BP 124/69 | HR 58 | Temp 98.1°F | Ht 63.43 in | Wt 201.0 lb

## 2013-05-27 DIAGNOSIS — J310 Chronic rhinitis: Secondary | ICD-10-CM

## 2013-05-27 DIAGNOSIS — R059 Cough, unspecified: Secondary | ICD-10-CM

## 2013-05-27 MED ORDER — BENZONATATE 100 MG PO CAPS
100.0000 mg | ORAL_CAPSULE | Freq: Three times a day (TID) | ORAL | Status: DC | PRN
Start: 2013-05-27 — End: 2013-06-28

## 2013-05-27 MED ORDER — FLUTICASONE PROPIONATE 50 MCG/ACT NA SUSP
1.0000 | Freq: Every day | NASAL | Status: DC
Start: 2013-05-27 — End: 2013-06-28

## 2013-05-27 NOTE — Patient Instructions (Signed)
MEDICATION: FLONASE Nasal Spray  Flonase (generic name: fluticasone) is an anti-inflammatory nasal spray. It is used to treat the nasal symptoms of allergies ("hay fever"). Improvement usually begins within five days, although it may require up to three weeks. Notify your doctor if you are not getting better after three weeks of use. This medicine is not effective for an acute flare up of nasal allergy because it does not work immediately. Decongestants or antihistamines may be needed as short term treatment.  DIRECTIONS FOR USE:  Shake the container well, blow the nose to clear out any mucus, tilt the head forward and insert the nozzle. Hold the other nostril closed. Breathe in through the open nostril and spray. Shake the container and repeat in the other nostril. Do not use more sprays than directed, or more often than prescribed.  WHAT TO WATCH FOR:  POSSIBLE SIDE EFFECTS: Mild temporary nasal burning or stinging --> (This is normal). Bleeding from the nose --> (Pinch the nose for five minutes. If this does not stop the bleeding, contact your doctor or this facility). Headache --> (Contact your doctor if this persists).    ---------- IMPORTANT ----------  MEDICAL CONDITIONS: Before starting this medicine, be sure your doctor knows if you have any of the following conditions:  -- Pregnancy or breast feeding  -- Active tuberculosis, fungal, bacterial or severe viral infection  -- Herpes infection in the eye   -- Recent surgery or injury to the nose  DRUG INTERACTIONS: Before starting this medicine, be sure your doctor knows if you are taking any of the following drugs:  -- Prednisone, other oral steroids  -- Other steroid inhalers or sprays  [NOTE: This information topic may not include all directions, precautions, medical conditions, drug/food interactions and warnings for this drug. Check with your doctor, nurse or pharmacist for any questions that you may have.]   2000-2012 [employer], [address], [place] [address]. All rights reserved. This information is not intended as a substitute for professional medical care. Always follow your healthcare professional's instructions.

## 2013-05-27 NOTE — Progress Notes (Signed)
Subjective:       Patient ID: [name] is a 58 y.o. female.  Encounter was performed in Spanish.  Chief Complaint   Patient presents with   . Cough       HPI  Patient is a 58 year old Hispanic female with history of obesity, diabetes mellitus 2, hypertension, hyperlipidemia here today for evaluation of a cough.    Patient reports that for the last week or so she has been having a cough that is mostly dry in nature. She also reports having a tickling sensation in her throat. In addition she has been experiencing some rhinorrhea.  She has not been having any fevers, chills, night sweats, significant nasal or sinus congestion, ear itching or pain, shortness of breath, wheezing, lightheadedness, chest pain, palpitations.  She did have a few tablets left over from a prior prescription for [facility] and she has used these which helped.  She has also tried taking Zyrtec which did not help much.She has not tried anything else for her symptoms.  She denies any  Aggravating factors.  She denies any sick contacts.    In regards to her hypertension,she reports she has been taking atenolol 50 mg daily and also increased dose of diltiazem 240 mg daily as well as lisinopril 40 mg daily.  She reports she did not get from her pharmacy the prescription for hydrochlorothiazide and has therefore not been taking it.  Her blood pressures at home have been in the 120 systolic and 60s to 70s diastolic.    The following portions of the patient's history were reviewed and updated as appropriate: allergies, current medications, past medical history, past social history and problem list.    Past Medical History   Diagnosis Date   . Glaucoma 06/01/2012   . Diabetes mellitus type 2 in obese    . Hypertension    . Hyperlipidemia    . Obesity        History     Social History   . Marital Status: Single     Spouse Name: N/A     Number of Children: 3   . Years of Education: N/A     Occupational History   . Maintenance      Social  History Main Topics   . Smoking status: Never Smoker    . Smokeless tobacco: Never Used   . Alcohol Use: No   . Drug Use: No   . Sexually Active: Not on file     Other Topics Concern   . Not on file     Social History Narrative    Originally from [place].  Lives with her daughter.Exercise: walks frequently       No Known Allergies    Current Outpatient Prescriptions   Medication Sig Dispense Refill   . amLODIPine (NORVASC) 10 MG tablet        . aspirin 81 MG tablet Take 81 mg by mouth daily.         Marland Kitchen atenolol (TENORMIN) 50 MG tablet Take 1 tablet (50 mg total) by mouth daily.  90 tablet  0   . bimatoprost (LUMIGAN) 0.03 % ophthalmic drops Place 1 drop into both eyes nightly.  2.5 mL  6   . Calcium Carbonate-Vitamin D (CALCIUM-D PO) Take 1 tablet by mouth daily.       Marland Kitchen diltiazem (CARTIA XT) 240 MG 24 hr capsule Take 1 capsule (240 mg total) by mouth daily.  30 capsule  2   .  glipiZIDE XL (GLUCOTROL XL) 5 MG 24 hr tablet Take 1 tablet (5 mg total) by mouth daily.  30 tablet  3   . hydrochlorothiazide (HYDRODIURIL) 25 MG tablet Take 1 tablet (25 mg total) by mouth daily.  90 tablet  0   . insulin NPH-insulin regular (HUMULIN,NOVOLIN 70/30) (70-30) 100 UNIT/ML injection Take 68 units subcutaneously in the morning and 32 units in the evening  30 mL  0   . Insulin Syringe-Needle U-100 (INSULIN SYRINGE 1CC/30GX5/16") 30G X 5/16" 1 ML Misc Use syringe for insulin injection 4 times a day  100 each  1   . latanoprost (XALATAN) 0.005 % ophthalmic solution        . lisinopril (PRINIVIL,ZESTRIL) 40 MG tablet Take 1 tablet (40 mg total) by mouth daily.  90 tablet  0   . metFORMIN (GLUCOPHAGE) 1000 MG tablet Take 1 tablet (1,000 mg total) by mouth 2 (two) times daily with meals.  180 tablet  0   . ONE TOUCH ULTRA TEST test strip        . ONETOUCH DELICA LANCETS 33G Misc        . pravastatin (PRAVACHOL) 20 MG tablet Take 1 tablet (20 mg total) by mouth daily.  30 tablet  3   . [DISCONTINUED] insulin NPH-insulin regular  (HUMULIN,NOVOLIN 70/30) (70-30) 100 UNIT/ML injection Inject 62 Units into the skin See Admin Instructions. Take 62 units in the morning and 27 units in the evening  30 mL  0       Review of Systems   Constitutional: Negative for fever, chills and fatigue.   HENT: Positive for rhinorrhea. Negative for congestion, ear pain, nosebleeds and sinus pressure.    Respiratory: Positive for cough. Negative for chest tightness and shortness of breath.    Cardiovascular: Negative for chest pain and palpitations.   Neurological: Negative for light-headedness.           Objective:    Physical Exam   Vitals reviewed.  Constitutional: She appears well-developed. No distress.        obese   HENT:   Right Ear: No tenderness. Tympanic membrane is not erythematous. No middle ear effusion.   Left Ear: No tenderness. Tympanic membrane is not erythematous.  No middle ear effusion.   Nose: Mucosal edema and rhinorrhea present. Right sinus exhibits no maxillary sinus tenderness and no frontal sinus tenderness. Left sinus exhibits no maxillary sinus tenderness and no frontal sinus tenderness.   Mouth/Throat: No oropharyngeal exudate.   Cardiovascular: Normal rate and regular rhythm.  Exam reveals no gallop and no friction rub.    No murmur heard.  Pulmonary/Chest: Effort normal. No respiratory distress. She has no wheezes. She has no rales.   Lymphadenopathy:     She has no cervical adenopathy.   Skin: She is not diaphoretic.     BP 124/69  Pulse 58  Temp 98.1 F (36.7 C) (Oral)  Ht 1.611 m (5' 3.43")  Wt 91.173 kg (201 lb)  BMI 35.13 kg/m2  SpO2 98%    Lab Results   Component Value Date    HGBA1C 8.3* 03/29/2013     Lab Results   Component Value Date    WBC 9.94 02/17/2011    HGB 14.0 02/17/2011    HCT 40.6 02/17/2011    MCV 89.8 02/17/2011    PLT 237 02/17/2011     '  Chemistry        Component Value Date/Time    NA 138 03/29/2013 0746  K 4.5 03/29/2013 0746    CL 103 03/29/2013 0746    CL 100 09/27/2012 0801    CO2 26 03/29/2013 0746     BUN 19 03/29/2013 0746    CREAT 0.80 03/29/2013 0746    CREAT 0.55* 09/27/2012 0801    CREAT 0.7 02/17/2011 0410    GLU 135* 03/29/2013 0746    GLU 149* 09/27/2012 0801        Component Value Date/Time    CA 9.2 03/29/2013 0746    ALKPHOS 79 03/29/2013 0746    AST 37* 03/29/2013 0746    ALT 39* 03/29/2013 0746    BILITOTAL 0.5 03/29/2013 0746        Lab Results   Component Value Date    CHOL 174 03/29/2013    CHOL 170 06/06/2012    CHOL 163 02/18/2011     Lab Results   Component Value Date    HDL 69 03/29/2013    HDL 47 06/06/2012    HDL 54 02/18/2011     Lab Results   Component Value Date    LDL 87 03/29/2013    LDL 104* 06/06/2012    LDL 84 02/18/2011     Lab Results   Component Value Date    TRIG 92 03/29/2013    TRIG 95 06/06/2012    TRIG 126 02/18/2011     Lab Results   Component Value Date    TSH 0.58 02/17/2011           Assessment:       1. Cough  benzonatate (TESSALON PERLES) 100 MG capsule   2. Rhinitis  fluticasone (FLONASE) 50 MCG/ACT nasal spray           Plan:       Discussed with patient that she does not have any signs or symptoms of infectious etiologies to her cough.  Her cough is likely secondary to postnasal drip and rhinitis for which we have recommended that she use Flonase, one squirt in each nostril daily.  As she did have some improvement in the cough with Jerilynn Som, we have also given her prescription for her to take one capsule every 8 hours as needed.    In regards to her hypertension, her blood pressure is under excellent control on current atenolol, diltiazem, and lisinopril.  We have asked her not to resume therapy with hydrochlorothiazide.    Risk & Benefits of the new medication(s) were explained to the patient (and family) who verbalized understanding & agreed to the treatment plan. Patient (family) encouraged to contact me/clinical staff with any questions/concerns    She will return to clinic if no improvement in cough over the next 5-7 days or sooner as needed if she develops any fevers, chills,  or other signs of infection.Marland Kitchen

## 2013-06-06 ENCOUNTER — Encounter (INDEPENDENT_AMBULATORY_CARE_PROVIDER_SITE_OTHER): Payer: Self-pay

## 2013-06-06 NOTE — Progress Notes (Signed)
I have left a message for the patient with the Spanish Interpreter [phone] at [phone] to obtain an update. Pending call back.

## 2013-06-10 ENCOUNTER — Other Ambulatory Visit (INDEPENDENT_AMBULATORY_CARE_PROVIDER_SITE_OTHER): Payer: Self-pay

## 2013-06-10 DIAGNOSIS — E119 Type 2 diabetes mellitus without complications: Secondary | ICD-10-CM

## 2013-06-10 NOTE — Telephone Encounter (Signed)
Pt left a message on the Spanish line stating that she dropped her Insuline and broke the vile and would like a refill sent to her pharmacy. She is scheduled for a physical with you on 06/28/13.    Thanks,  Lafonda Mosses

## 2013-06-11 MED ORDER — INSULIN NPH ISOPHANE & REGULAR (70-30) 100 UNIT/ML SC SUSP
SUBCUTANEOUS | Status: DC
Start: 2013-06-10 — End: 2013-06-28

## 2013-06-11 NOTE — Telephone Encounter (Signed)
Rx for insulin sent to pharmacy on file

## 2013-06-22 ENCOUNTER — Encounter (INDEPENDENT_AMBULATORY_CARE_PROVIDER_SITE_OTHER): Payer: Self-pay

## 2013-06-23 ENCOUNTER — Encounter (INDEPENDENT_AMBULATORY_CARE_PROVIDER_SITE_OTHER): Payer: Self-pay | Admitting: Internal Medicine

## 2013-06-25 ENCOUNTER — Other Ambulatory Visit (INDEPENDENT_AMBULATORY_CARE_PROVIDER_SITE_OTHER): Payer: Self-pay

## 2013-06-25 DIAGNOSIS — E119 Type 2 diabetes mellitus without complications: Secondary | ICD-10-CM

## 2013-06-25 DIAGNOSIS — I10 Essential (primary) hypertension: Secondary | ICD-10-CM

## 2013-06-26 ENCOUNTER — Telehealth (INDEPENDENT_AMBULATORY_CARE_PROVIDER_SITE_OTHER): Payer: Self-pay

## 2013-06-26 MED ORDER — METFORMIN HCL 1000 MG PO TABS
1000.0000 mg | ORAL_TABLET | Freq: Two times a day (BID) | ORAL | Status: DC
Start: 2013-06-26 — End: 2013-06-28

## 2013-06-26 MED ORDER — DILTIAZEM HCL ER COATED BEADS 240 MG PO CP24
240.0000 mg | ORAL_CAPSULE | Freq: Every day | ORAL | Status: DC
Start: 2013-06-26 — End: 2013-06-28

## 2013-06-26 NOTE — Telephone Encounter (Signed)
Called pt home number to remind her of upcoming app't on 5/22 with PCP at 0730 and that she should arrive fasting for blood work.  Also to bring her meter/logs for review.  Left my callback number if needed.

## 2013-06-28 ENCOUNTER — Ambulatory Visit (INDEPENDENT_AMBULATORY_CARE_PROVIDER_SITE_OTHER): Payer: HMO | Admitting: Internal Medicine

## 2013-06-28 ENCOUNTER — Other Ambulatory Visit (INDEPENDENT_AMBULATORY_CARE_PROVIDER_SITE_OTHER): Payer: Self-pay | Admitting: Internal Medicine

## 2013-06-28 ENCOUNTER — Encounter (INDEPENDENT_AMBULATORY_CARE_PROVIDER_SITE_OTHER): Payer: Self-pay | Admitting: Internal Medicine

## 2013-06-28 VITALS — BP 154/74 | HR 62 | Temp 97.7°F | Ht 63.43 in | Wt 197.0 lb

## 2013-06-28 DIAGNOSIS — I10 Essential (primary) hypertension: Secondary | ICD-10-CM

## 2013-06-28 DIAGNOSIS — E1139 Type 2 diabetes mellitus with other diabetic ophthalmic complication: Secondary | ICD-10-CM

## 2013-06-28 DIAGNOSIS — Z0000 Encounter for general adult medical examination without abnormal findings: Secondary | ICD-10-CM

## 2013-06-28 DIAGNOSIS — E785 Hyperlipidemia, unspecified: Secondary | ICD-10-CM

## 2013-06-28 DIAGNOSIS — E119 Type 2 diabetes mellitus without complications: Secondary | ICD-10-CM

## 2013-06-28 DIAGNOSIS — E11319 Type 2 diabetes mellitus with unspecified diabetic retinopathy without macular edema: Secondary | ICD-10-CM

## 2013-06-28 HISTORY — DX: Type 2 diabetes mellitus with unspecified diabetic retinopathy without macular edema: E11.319

## 2013-06-28 MED ORDER — INSULIN SYRINGE 30G X 5/16" 1 ML MISC
Status: DC
Start: 2013-06-28 — End: 2013-10-31

## 2013-06-28 MED ORDER — INSULIN NPH ISOPHANE & REGULAR (70-30) 100 UNIT/ML SC SUSP
SUBCUTANEOUS | Status: DC
Start: 2013-06-28 — End: 2013-08-07

## 2013-06-28 MED ORDER — GLIPIZIDE ER 5 MG PO TB24
5.0000 mg | ORAL_TABLET | Freq: Every day | ORAL | Status: DC
Start: 2013-06-28 — End: 2013-09-25

## 2013-06-28 MED ORDER — ATENOLOL 50 MG PO TABS
50.0000 mg | ORAL_TABLET | Freq: Every day | ORAL | Status: DC
Start: 2013-06-28 — End: 2013-07-26

## 2013-06-28 MED ORDER — DILTIAZEM HCL ER COATED BEADS 300 MG PO CP24
300.0000 mg | ORAL_CAPSULE | Freq: Every day | ORAL | Status: DC
Start: 2013-06-28 — End: 2013-09-30

## 2013-06-28 MED ORDER — LISINOPRIL 40 MG PO TABS
40.0000 mg | ORAL_TABLET | Freq: Every day | ORAL | Status: DC
Start: 2013-06-28 — End: 2013-07-26

## 2013-06-28 MED ORDER — PRAVASTATIN SODIUM 20 MG PO TABS
20.0000 mg | ORAL_TABLET | Freq: Every day | ORAL | Status: DC
Start: 2013-06-28 — End: 2013-10-31

## 2013-06-28 MED ORDER — GLUCOSE BLOOD VI STRP
ORAL_STRIP | Status: DC
Start: 2013-06-28 — End: 2013-10-31

## 2013-06-28 MED ORDER — METFORMIN HCL 1000 MG PO TABS
1000.0000 mg | ORAL_TABLET | Freq: Two times a day (BID) | ORAL | Status: DC
Start: 2013-06-28 — End: 2013-10-31

## 2013-06-28 NOTE — Progress Notes (Signed)
1. Have you referred yourself since we last saw you?no

## 2013-06-28 NOTE — Progress Notes (Signed)
Subjective:       Patient ID: [name] is a 58 y.o. female.  Chief Complaint   Patient presents with   . Diabetes   . Hypertension   . Hyperlipidemia       HPI   Patient is a 59 year old Hispanic female with history of obesity, diabetes mellitus, hypertension, hyperlipidemia, glaucoma who presents today for followup of the above. Patient was originally scheduled for an annual wellness exam but states she wants to reschedule this as she needs to be somewhere shortly.    Diabetes mellitus 2 - last hemoglobin A1c was 8.3% in February.  She reports compliance with metformin 1000 mg 2 times a day, glipizide XL 5 milligrams daily, and Novolin insulin 70/30 68 units in the morning and 32 units in the evening.  She has been checking her blood sugars at home and states that in the mornings prior to breakfast they're usually under 102 hours after dinner they are usually around 150 or so. She denies any polydipsia, polyuria, extremity numbness or tingling. She was recently seen by her ophthalmologist and was told there were signs of diabetic retinopathy in the right eye for which she has an appointment scheduled with a retinologist. He checks her feet regularly.    Hypertension - she has been checking her blood pressures at home and states they have been running in the 140s to 150s systolic and 70s to 80s diastolic. She reports compliance with atenolol 50 mg daily, diltiazem 240 mg daily, lisinopril 40 mg daily. She denies any frequent headaches, chest pain, palpitations, shortness of breath no lower extremity edema, or symptoms of claudication.  She admits she has been under a lot of stress at home due to problems with her 2 sons    Hyperlipidemia - rest of the panel was in February 2015 and showed acceptable control with LDL at goal of under 100. She reports compliance with Pravachol 20 mg daily and denies any frequent myalgias or abdominal pain.    She is being followed by the [facility] for venous  insufficiency.      The following portions of the patient's history were reviewed and updated as appropriate: allergies, current medications, past family history, past medical history, past social history, past surgical history and problem list.     Past Medical History   Diagnosis Date   . Glaucoma 06/01/2012   . Diabetes mellitus type 2 in obese    . Hypertension    . Hyperlipidemia    . Obesity        History     Social History   . Marital Status: Single     Spouse Name: N/A     Number of Children: 3   . Years of Education: N/A     Occupational History   . Maintenance      Social History Main Topics   . Smoking status: Never Smoker    . Smokeless tobacco: Never Used   . Alcohol Use: No   . Drug Use: No   . Sexual Activity: Not on file     Other Topics Concern   . Not on file     Social History Narrative    Originally from [place].  Lives with her daughter.    Exercise: walks frequently       No Known Allergies    Current Outpatient Prescriptions   Medication Sig Dispense Refill   . aspirin 81 MG tablet Take 81 mg by mouth  daily.         . atenolol (TENORMIN) 50 MG tablet Take 1 tablet (50 mg total) by mouth daily.  90 tablet  0   . Calcium Carbonate-Vitamin D (CALCIUM-D PO) Take 1 tablet by mouth daily.       Marland Kitchen diltiazem (CARTIA XT) 240MG  24 hr capsule Take 1 capsule (240mg  total) by mouth daily.  90 capsule  0   . glipiZIDE XL (GLUCOTROL XL) 5 MG 24 hr tablet Take 1 tablet (5 mg total) by mouth daily.  90 tablet  0   . glucose blood (ONE TOUCH ULTRA TEST) test strip Measure blood glucose 2 times a day  100 each  1   . insulin NPH-insulin regular (HUMULIN,NOVOLIN 70/30) (70-30) 100 UNIT/ML injection Take 68 units subcutaneously in the morning and 32 units in the evening  30 mL  0   . Insulin Syringe-Needle U-100 (INSULIN SYRINGE 1CC/30GX5/16") 30G X 5/16" 1 ML Misc Use syringe for insulin injection 2 times a day  100 each  1   . latanoprost (XALATAN) 0.005 % ophthalmic solution        . lisinopril  (PRINIVIL,ZESTRIL) 40 MG tablet Take 1 tablet (40 mg total) by mouth daily.  90 tablet  0   . metFORMIN (GLUCOPHAGE) 1000 MG tablet Take 1 tablet (1,000 mg total) by mouth 2 (two) times daily with meals.  180 tablet  0   . ONETOUCH DELICA LANCETS 33G Misc        . pravastatin (PRAVACHOL) 20 MG tablet Take 1 tablet (20 mg total) by mouth daily.  90 tablet  0     No current facility-administered medications for this visit.         Review of Systems   Constitutional: Negative for fatigue.   Respiratory: Negative for chest tightness and shortness of breath.    Cardiovascular: Negative for chest pain and palpitations.   Gastrointestinal: Negative for nausea, vomiting, diarrhea and constipation.   Genitourinary: Negative for dysuria and frequency.   Neurological: Negative for dizziness, light-headedness and headaches.   Psychiatric/Behavioral: Negative for dysphoric mood. The patient is not nervous/anxious.            Objective:    Physical Exam   Constitutional: She is oriented to person, place, and time. She appears well-developed. No distress.   obese   HENT:   Moist oral mucous membranes   Eyes:   Wearing glasses   Cardiovascular: Normal rate and regular rhythm.  Exam reveals no gallop and no friction rub.    Murmur (2/6 systolic murmur heard best at LUSB) heard.  Pulmonary/Chest: Effort normal. No respiratory distress. She has no wheezes. She has no rales.   Abdominal: Soft. There is no tenderness.   Musculoskeletal: She exhibits no edema.   Wearing bilateral  Leg compression stockings   Neurological: She is alert and oriented to person, place, and time.   Skin: Skin is warm and dry. She is not diaphoretic.   Psychiatric: Her behavior is normal.   Vitals reviewed.    BP 154/74   Pulse 62   Temp(Src) 97.7 F (36.5 C) (Oral)   Ht 1.611 m (5' 3.43")   Wt 89.359 kg (197 lb)   BMI 34.43 kg/m2       Lab Results   Component Value Date    HGBA1C 8.3* 03/29/2013     Lab Results   Component Value Date    WBC 9.94  02/17/2011    HGB 14.0 02/17/2011  HCT 40.6 02/17/2011    MCV 89.8 02/17/2011    PLT 237 02/17/2011     '  Chemistry        Component Value Date/Time    NA 138 03/29/2013 0746    K 4.5 03/29/2013 0746    CL 103 03/29/2013 0746    CL 100 09/27/2012 0801    CO2 26 03/29/2013 0746    BUN 19 03/29/2013 0746    CREAT 0.80 03/29/2013 0746    CREAT 0.55* 09/27/2012 0801    CREAT 0.7 02/17/2011 0410    GLU 135* 03/29/2013 0746    GLU 149* 09/27/2012 0801        Component Value Date/Time    CA 9.2 03/29/2013 0746    ALKPHOS 79 03/29/2013 0746    AST 37* 03/29/2013 0746    ALT 39* 03/29/2013 0746    BILITOTAL 0.5 03/29/2013 0746        Lab Results   Component Value Date    CHOL 174 03/29/2013    CHOL 170 06/06/2012    CHOL 163 02/18/2011     Lab Results   Component Value Date    HDL 69 03/29/2013    HDL 47 06/06/2012    HDL 54 02/18/2011     Lab Results   Component Value Date    LDL 87 03/29/2013    LDL 104* 06/06/2012    LDL 84 02/18/2011     Lab Results   Component Value Date    TRIG 92 03/29/2013    TRIG 95 06/06/2012    TRIG 126 02/18/2011     Lab Results   Component Value Date    TSH 0.58 02/17/2011           Assessment:       1. DM (diabetes mellitus), type 2  glipiZIDE XL (GLUCOTROL XL) 5 MG 24 hr tablet    insulin NPH-insulin regular (HUMULIN,NOVOLIN 70/30) (70-30) 100 UNIT/ML injection    metFORMIN (GLUCOPHAGE) 1000 MG tablet    glucose blood (ONE TOUCH ULTRA TEST) test strip    Insulin Syringe-Needle U-100 (INSULIN SYRINGE 1CC/30GX5/16") 30G X 5/16" 1 ML Misc    Hemoglobin A1C   2. Hypertension  diltiazem (CARTIA XT) 300 MG 24 hr capsule    atenolol (TENORMIN) 50 MG tablet    lisinopril (PRINIVIL,ZESTRIL) 40 MG tablet   3. Hyperlipidemia  pravastatin (PRAVACHOL) 20 MG tablet            Plan:       DM2 - Suboptimal control on last visit with A1C above goal of under 7%.  After that value, her insulin was adjusted to the current dose and her home blood glucose readings appear to be better.  Will recheck an A1C.  Continue current metformin 1000 mg 2  times a day, glipizide XL 5 mg daily, and also Novolin 70/30 68 units in the morning and 32 units in the evening.  Patient will continue followup with her retinologist given her new diagnosis of right diabetes retinopathy. She is aware she should check her feet regularly.    Hypertension - poorly controlled blood pressure above goal of under 130/80 mm Hg. We will increase the dose of diltiazem from 240 mg daily to 300 mg daily.  We will continue current atenolol 50 mg daily, lisinopril 40 mg daily. We have encouraged patient to continue to monitor her blood pressures at home, with goal as above.    Hyperlipidemia - well-controlled on last check as above.  Patient will continue current Pravachol 20 mg daily. Encouraged her to lose weight and also follow a heart healthy diet. We will recheck a fasting lipid panel.    RTC at patient's convenience for annual exam.  Will need f/u in 3 months for f/u diabetes and hypertension  Procedures

## 2013-06-29 LAB — COMPREHENSIVE METABOLIC PANEL
ALT: 42 U/L — ABNORMAL HIGH (ref 6–29)
AST (SGOT): 43 U/L — ABNORMAL HIGH (ref 10–35)
Albumin/Globulin Ratio: 1.3 (ref 1.0–2.5)
Albumin: 4 G/DL (ref 3.6–5.1)
Alkaline Phosphatase: 96 U/L (ref 33–130)
BUN: 14 MG/DL (ref 7–25)
Bilirubin, Total: 0.3 MG/DL (ref 0.2–1.2)
CO2: 27 mmol/L (ref 19–30)
Calcium: 9.6 MG/DL (ref 8.6–10.4)
Chloride: 102 mmol/L (ref 98–110)
Creatinine: 0.67 mg/dL (ref 0.50–1.05)
EGFR African American: 113 mL/min/{1.73_m2} (ref >=60)
EGFR: 98 mL/min/{1.73_m2} (ref >=60)
Globulin: 3.2 G/DL (ref 1.9–3.7)
Glucose: 136 MG/DL — ABNORMAL HIGH (ref 65–99)
Potassium: 5 mmol/L (ref 3.5–5.3)
Protein, Total: 7.2 G/DL (ref 6.1–8.1)
Sodium: 139 mmol/L (ref 135–146)

## 2013-06-29 LAB — TSH: TSH: 1.17 mIU/L (ref 0.40–4.50)

## 2013-06-29 LAB — LIPID PANEL
Cholesterol / HDL Ratio: 2.2 (ref 0.0–5.0)
Cholesterol: 157 MG/DL (ref 125–200)
HDL: 70 mg/dL (ref >=46)
LDL Calculated: 66 mg/dL (ref <130)
Non HDL Cholesterol (LDL and VLDL): 87 mg/dL
Triglycerides: 103 MG/DL (ref <150)

## 2013-06-29 LAB — HEMOGLOBIN A1C: Hemoglobin A1C: 7.7 % — ABNORMAL HIGH (ref <5.7)

## 2013-07-08 ENCOUNTER — Encounter (INDEPENDENT_AMBULATORY_CARE_PROVIDER_SITE_OTHER): Payer: Self-pay

## 2013-07-08 NOTE — Progress Notes (Signed)
I have noted the Hemoglobin A1c decrease:  Results for MERCEDES, FORT (MRN [id number]) as of 07/08/2013 11:50   Ref. Range 03/29/2013 07:46 06/28/2013 00:00   Hemoglobin A1C Latest Range: <5.7 % 8.3 (H) 7.7 (H)       I have left a message for the patient at [phone] with the assistance of Spanish Interpreter [phone].  I have let her the know the A____ lab result has decreased further and to continue the meal planning in place.  I have encouraged her to call me as needed.

## 2013-07-10 ENCOUNTER — Encounter (INDEPENDENT_AMBULATORY_CARE_PROVIDER_SITE_OTHER): Payer: Self-pay | Admitting: Internal Medicine

## 2013-07-10 NOTE — Progress Notes (Signed)
Received paperwork to be filled out for patient's son [name]) for certification for health care provider for family member's serious health condition (FMLA) to be filled out.  Mrs. Galloway has not had any serious illness recently (to my knowledge) that would justify FMLA for her son.  Tried to call patient on mulitple occasions and was unable to reach her.  Will keep paperwork in my paper inbox.

## 2013-07-18 ENCOUNTER — Ambulatory Visit: Payer: HMO

## 2013-07-25 ENCOUNTER — Encounter (INDEPENDENT_AMBULATORY_CARE_PROVIDER_SITE_OTHER): Payer: Self-pay

## 2013-07-25 NOTE — Progress Notes (Signed)
I have left a message for the patient with the assistance of Spanish Interpreter [phone] at [phone] to remind her about the appointment with Dr. Charlesetta Ivory on 07/29/13 at 0800 and to take in Glucometer and logs to be assessed further.  I have asked her to call me back if any questions.

## 2013-07-26 ENCOUNTER — Other Ambulatory Visit (INDEPENDENT_AMBULATORY_CARE_PROVIDER_SITE_OTHER): Payer: Self-pay

## 2013-07-26 DIAGNOSIS — I10 Essential (primary) hypertension: Secondary | ICD-10-CM

## 2013-07-29 ENCOUNTER — Encounter (INDEPENDENT_AMBULATORY_CARE_PROVIDER_SITE_OTHER): Payer: Self-pay | Admitting: Internal Medicine

## 2013-07-29 ENCOUNTER — Ambulatory Visit (INDEPENDENT_AMBULATORY_CARE_PROVIDER_SITE_OTHER): Payer: HMO | Admitting: Internal Medicine

## 2013-07-29 ENCOUNTER — Encounter (INDEPENDENT_AMBULATORY_CARE_PROVIDER_SITE_OTHER): Payer: Self-pay

## 2013-07-29 VITALS — BP 139/57 | HR 74 | Temp 98.3°F | Resp 16 | Ht 63.43 in | Wt 198.0 lb

## 2013-07-29 DIAGNOSIS — Z01419 Encounter for gynecological examination (general) (routine) without abnormal findings: Secondary | ICD-10-CM

## 2013-07-29 DIAGNOSIS — E785 Hyperlipidemia, unspecified: Secondary | ICD-10-CM

## 2013-07-29 DIAGNOSIS — Z124 Encounter for screening for malignant neoplasm of cervix: Secondary | ICD-10-CM

## 2013-07-29 DIAGNOSIS — E669 Obesity, unspecified: Secondary | ICD-10-CM

## 2013-07-29 DIAGNOSIS — I10 Essential (primary) hypertension: Secondary | ICD-10-CM

## 2013-07-29 DIAGNOSIS — Z23 Encounter for immunization: Secondary | ICD-10-CM

## 2013-07-29 DIAGNOSIS — Z0000 Encounter for general adult medical examination without abnormal findings: Secondary | ICD-10-CM

## 2013-07-29 DIAGNOSIS — Z1211 Encounter for screening for malignant neoplasm of colon: Secondary | ICD-10-CM

## 2013-07-29 DIAGNOSIS — E119 Type 2 diabetes mellitus without complications: Secondary | ICD-10-CM

## 2013-07-29 MED ORDER — LISINOPRIL 40 MG PO TABS
40.0000 mg | ORAL_TABLET | Freq: Every day | ORAL | Status: DC
Start: 2013-07-29 — End: 2013-10-31

## 2013-07-29 MED ORDER — ATENOLOL 50 MG PO TABS
50.0000 mg | ORAL_TABLET | Freq: Every day | ORAL | Status: DC
Start: 2013-07-29 — End: 2013-10-31

## 2013-07-29 NOTE — Progress Notes (Signed)
Subjective:       Patient ID: [name] is a 58 y.o. female.  Chief Complaint   Patient presents with   . Annual Exam       HPI   Patient is a 58 year old Hispanic female with history of diabetes mellitus, hypertension, edema, glaucoma, fatty liver her mother's who presents today for annual wellness exam. Patient does not have any complaints today.    Diabetes mellitus 2 - her last hemoglobin A1c was 7.7% in May 2015. She reports compliance with Humulin 70/30 68 units in the morning and 32 units in the evening. She checks her blood sugars 2 times a day, prior to breakfast usually in the 90s to 100's and 2 hours after dinner usually in the low 100s. She states she has been watching her carbohydrate intake and has been walking regularly, particularly as her ophthalmologist Dr. Kemper Durie told her that she had some retinopathy. She checks her feet regularly and she has had an eye exam over the last year.    Hypertension - reports compliance with atenolol 50 mg daily, diltiazem 300 mg daily, lisinopril 40 mg daily. Denies any frequent headaches, no visual changes, chest pain, palpitations, shortness of breath, or increased lower extremity edema. She does check her blood pressures at home which have been running in the 120 systolic and 70s diastolic.    Hyperlipidemia - panel done in May 2015 showed LDL abdomen under 70. She reports compliance with Pravachol 20 mg daily and denies any frequent myalgias or abdominal pain.    Woman's History:  No LMP recorded. Patient is postmenopausal.   G3P3    Health maintenance:  Breast cancer screening - last mammogram was done 07/22/2013  Cervical cancer screening - last pap smear was done 2013, normal per patient  Colon cancer screening - declines colonoscopy; wants to have stool card testing  Pneumovax - thinks she had 3 years ago  Tdap - has not had in over 10 years      The following portions of the patient's history were reviewed and updated as appropriate: allergies,  current medications, past family history, past medical history, past social history, past surgical history and problem list.     Past Medical History   Diagnosis Date   . Glaucoma 06/01/2012   . Diabetes mellitus type 2 in obese    . Hypertension    . Hyperlipidemia    . Obesity    . Diabetic retinopathy 06/28/2013     Of the right eye, per patient       Past Surgical History   Procedure Laterality Date   . Breast cyst excision  1993     bilateral       History     Social History   . Marital Status: Single     Spouse Name: N/A     Number of Children: 3   . Years of Education: N/A     Occupational History   . Maintenance      Social History Main Topics   . Smoking status: Never Smoker    . Smokeless tobacco: Never Used   . Alcohol Use: No   . Drug Use: No   . Sexual Activity: Not Currently     Other Topics Concern   . Not on file     Social History Narrative    Originally from [place].  Lives with her daughter.    Exercise: walks frequently       Family History  Problem Relation Age of Onset   . Heart disease Mother    . Diabetes Mother    . Hypertension Mother    . Hyperlipidemia Mother    . Heart disease Father    . Diabetes Father    . Hypertension Father    . Hyperlipidemia Father    . Cancer Maternal Aunt      metastatic abdomen   . Cancer Maternal Uncle      liver       No Known Allergies    Current Outpatient Prescriptions   Medication Sig Dispense Refill   . aspirin 81 MG tablet Take 81 mg by mouth daily.       Marland Kitchen atenolol (TENORMIN) 50 MG tablet Take 1 tablet (50 mg total) by mouth daily. 90 tablet 0   . Calcium Carbonate-Vitamin D (CALCIUM-D PO) Take 1 tablet by mouth daily.     Marland Kitchen diltiazem (CARTIA XT) 300 MG 24 hr capsule Take 1 capsule (300 mg total) by mouth daily. 90 capsule 0   . glipiZIDE XL (GLUCOTROL XL) 5 MG 24 hr tablet Take 1 tablet (5 mg total) by mouth daily. 90 tablet 0   . glucose blood (ONE TOUCH ULTRA TEST) test strip Measure blood glucose 2 times a day 100 each 1   . insulin NPH-insulin  regular (HUMULIN,NOVOLIN 70/30) (70-30) 100 UNIT/ML injection Take 68 units subcutaneously in the morning and 32 units in the evening 30 mL 0   . Insulin Syringe-Needle U-100 (INSULIN SYRINGE 1CC/30GX5/16") 30G X 5/16" 1 ML Misc Use syringe for insulin injection 2 times a day 100 each 1   . latanoprost (XALATAN) 0.005 % ophthalmic solution      . lisinopril (PRINIVIL,ZESTRIL) 40 MG tablet Take 1 tablet (40 mg total) by mouth daily. 90 tablet 0   . metFORMIN (GLUCOPHAGE) 1000 MG tablet Take 1 tablet (1,000 mg total) by mouth 2 (two) times daily with meals. 180 tablet 0   . ONETOUCH DELICA LANCETS 33G Misc      . pravastatin (PRAVACHOL) 20 MG tablet Take 1 tablet (20 mg total) by mouth daily. 90 tablet 0     No current facility-administered medications for this visit.         Review of Systems   Constitutional: Negative for fever, chills, fatigue and unexpected weight change.   HENT: Negative for hearing loss.    Eyes: Negative for visual disturbance.   Respiratory: Negative for cough, chest tightness and shortness of breath.    Cardiovascular: Negative for chest pain, palpitations and leg swelling.   Gastrointestinal: Negative for nausea, vomiting, diarrhea and constipation.   Genitourinary: Negative for difficulty urinating, vaginal pain, pelvic pain and dyspareunia.   Musculoskeletal: Negative for myalgias and arthralgias.        +leg cramps   Skin: Negative for rash.   Neurological: Negative for dizziness, weakness, light-headedness and headaches.        Some difficulty w/ balance at times   Hematological: Does not bruise/bleed easily.   Psychiatric/Behavioral: Negative for dysphoric mood.           Objective:    Physical Exam   Constitutional: She is oriented to person, place, and time. She appears well-developed. No distress.   obese   HENT:   Head: Normocephalic.   Right Ear: Tympanic membrane, external ear and ear canal normal.   Left Ear: Tympanic membrane, external ear and ear canal normal.   Nose: No  mucosal edema or rhinorrhea.   Mouth/Throat:  Oropharynx is clear and moist. No oropharyngeal exudate.   Eyes: Conjunctivae are normal. Pupils are equal, round, and reactive to light. Right eye exhibits no discharge. Left eye exhibits no discharge. No scleral icterus.   Wearing glasses   Neck: Neck supple. No tracheal deviation present. No thyromegaly present.   Cardiovascular: Normal rate, regular rhythm, normal heart sounds and intact distal pulses.  Exam reveals no gallop and no friction rub.    No murmur heard.  Pulmonary/Chest: Effort normal and breath sounds normal. No respiratory distress. She has no wheezes. She has no rales. Right breast exhibits no inverted nipple, no mass, no nipple discharge, no skin change and no tenderness. Left breast exhibits no inverted nipple, no mass, no nipple discharge, no skin change and no tenderness.       Abdominal: Soft. She exhibits no mass. There is no tenderness. There is no rebound and no guarding. Hernia confirmed negative in the right inguinal area and confirmed negative in the left inguinal area.   No hepatomegaly or splenomegaly appreciated   Genitourinary: There is no rash, tenderness, lesion or injury on the right labia. There is no rash, tenderness, lesion or injury on the left labia. Uterus is not enlarged and not tender. Cervix exhibits no motion tenderness and no friability. Right adnexum displays no tenderness. Left adnexum displays no tenderness. No tenderness or bleeding in the vagina. No vaginal discharge found.   +bladder prolapse   Musculoskeletal: She exhibits no edema.   Wearing bilateral lower extremity compression stockings   Lymphadenopathy:     She has no cervical adenopathy.        Right: No inguinal adenopathy present.        Left: No inguinal adenopathy present.   Neurological: She is alert and oriented to person, place, and time. She has normal strength. No cranial nerve deficit. Gait normal.   Reflex Scores:       Bicep reflexes are 2+ on the  right side and 2+ on the left side.       Patellar reflexes are 2+ on the right side and 2+ on the left side.  Bilateral distal monofilament testing is intact   Skin: Skin is warm and dry. She is not diaphoretic.   No skin breakdown was seen in evaluation of bilateral feet   Psychiatric: She has a normal mood and affect. Her behavior is normal.   Vitals reviewed.    BP 139/57 mmHg  Pulse 74  Temp(Src) 98.3 F (36.8 C) (Oral)  Resp 16  Ht 1.611 m (5' 3.43")  Wt 89.812 kg (198 lb)  BMI 34.61 kg/m2     EKG - NSR, Q waves in leads 3, some chronic ST elevation in leads V4, V5, and V6 which are stable from her last EKG one year ago    Lab Results   Component Value Date    HGBA1C 7.7* 06/28/2013     Lab Results   Component Value Date    WBC 9.94 02/17/2011    HGB 14.0 02/17/2011    HCT 40.6 02/17/2011    MCV 89.8 02/17/2011    PLT 237 02/17/2011     '  Chemistry        Component Value Date/Time    NA 139 06/28/2013 0000    K 5.0 06/28/2013 0000    CL 102 06/28/2013 0000    CL 100 09/27/2012 0801    CO2 27 06/28/2013 0000    BUN 14 06/28/2013 0000  CREAT 0.67 06/28/2013 0000    CREAT 0.55* 09/27/2012 0801    CREAT 0.7 02/17/2011 0410    GLU 136* 06/28/2013 0000    GLU 149* 09/27/2012 0801        Component Value Date/Time    CA 9.6 06/28/2013 0000    ALKPHOS 96 06/28/2013 0000    AST 43* 06/28/2013 0000    ALT 42* 06/28/2013 0000    BILITOTAL 0.3 06/28/2013 0000        Lab Results   Component Value Date    CHOL 157 06/28/2013    CHOL 174 03/29/2013    CHOL 170 06/06/2012     Lab Results   Component Value Date    HDL 70 06/28/2013    HDL 69 03/29/2013    HDL 47 06/06/2012     Lab Results   Component Value Date    LDL 66 06/28/2013    LDL 87 03/29/2013    LDL 104* 06/06/2012     Lab Results   Component Value Date    TRIG 103 06/28/2013    TRIG 92 03/29/2013    TRIG 126 02/18/2011     Lab Results   Component Value Date    TSH 1.17 06/28/2013    TSH 0.58 02/17/2011         Assessment:       1. Annual physical exam      2. Routine gynecological examination  IG LBPap w/HPV for age 60+ [facility] QST   3. Cervical cancer screening  IG LBPap w/HPV for age 47+ [facility] QST   4. Colon cancer screening  Stool Occult Blood, Single Specimen   5. Need for diphtheria-tetanus-pertussis (Tdap) vaccine  Tdap vaccine greater than or equal to 7yo IM   6. Diabetes mellitus type 2 in obese  Microalbumin, Random Urine    Creatinine, urine, random    ECG 12 lead   7. Essential hypertension  ECG 12 lead   8. Hyperlipidemia  ECG 12 lead            Plan:       1.  Ordered the above tests as part of patient's annual physical exam.      2.  We have provided the following counseling/Anticipatory Guidance:  Nutrition, physical activity, healthy weight, injury prevention, misuse of tobacco, alcohol and drugs, sexual behavior and STDs, contraception, dental health, mental health, immunizations, screenings.     3.  Breast cancer screening is up to date    4.  We have sent a Pap smear.  Pelvic exam was significant for bladder prolapse the patient is not having any symptoms for which we will continue to monitor.    5.  Tdap was administered today in clinic    6.  Diabetes mellitus - suboptimal control last hemoglobin A1c slightly above goal of under 7%. As recently patient has been closely monitoring her diet and her blood sugars have been in acceptable range, we will continue current general in 70/3060 units in the morning and 32 units in the evening and she will also continue current metformin 1000 mg 2 times a day. We may consider discontinuing Glucotrol during her next visit. She is aware she should have an MRI examination check her feet regularly.    7.  Hypertension - well-controlled on pressure under 130/80 mm Hg. She will continue current atenolol 50 mg daily, diltiazem 300 mg daily, and lisinopril 40 mg daily. Encourage patient to lose weight, follow a diet low in  sodium, and also exercise regularly as tolerated.    8.  Hyperlipidemia - well-controlled on  last check in May 2015. She will continue current pravastatin 20 mg daily.    9.  She will return to clinic in August 2015 for f/u diabetes or sooner as needed.       Procedures

## 2013-07-29 NOTE — Progress Notes (Signed)
I briefly saw the patient while in her appointment with Dr. Charlesetta Ivory who helped to translate.  I offered to meet with the patient but she needs to go to work.  She had no questions for me and has the educational material.      Her last Hemoglobin A1c was 7.7on 06/28/13.

## 2013-07-30 LAB — MICROALBUMIN, RANDOM URINE
Creatinine, UR: 108 mg/dL (ref 20–320)
Microalbumin/Creatinine Ratio: 10.2 mcg/mg crea
Microalbumin: 1.1 mg/dL

## 2013-07-30 LAB — CREATININE, URINE, RANDOM: Creatinine, UR: 108 mg/dL (ref 20–320)

## 2013-08-07 ENCOUNTER — Telehealth (INDEPENDENT_AMBULATORY_CARE_PROVIDER_SITE_OTHER): Payer: Self-pay | Admitting: Internal Medicine

## 2013-08-07 ENCOUNTER — Other Ambulatory Visit (INDEPENDENT_AMBULATORY_CARE_PROVIDER_SITE_OTHER): Payer: Self-pay | Admitting: Internal Medicine

## 2013-08-07 DIAGNOSIS — E1169 Type 2 diabetes mellitus with other specified complication: Secondary | ICD-10-CM

## 2013-08-07 DIAGNOSIS — E669 Obesity, unspecified: Secondary | ICD-10-CM

## 2013-08-07 MED ORDER — INSULIN NPH ISOPHANE & REGULAR (70-30) 100 UNIT/ML SC SUSP
SUBCUTANEOUS | Status: DC
Start: 2013-08-07 — End: 2013-10-31

## 2013-08-07 NOTE — Telephone Encounter (Signed)
Rx for Humulin sent to pharmacy on file today

## 2013-08-07 NOTE — Telephone Encounter (Signed)
Pt's daughter called stating that her mom is completely out of insulin NPH-insulin regular (HUMULIN,NOVOLIN 70/30) (70-30) 100 UNIT/ML injection and would like it sent to her local [facility] today.    Thanks,  Lafonda Mosses

## 2013-08-27 ENCOUNTER — Encounter (INDEPENDENT_AMBULATORY_CARE_PROVIDER_SITE_OTHER): Payer: Self-pay | Admitting: Internal Medicine

## 2013-09-06 ENCOUNTER — Encounter (INDEPENDENT_AMBULATORY_CARE_PROVIDER_SITE_OTHER): Payer: Self-pay

## 2013-09-10 ENCOUNTER — Encounter (INDEPENDENT_AMBULATORY_CARE_PROVIDER_SITE_OTHER): Payer: Self-pay

## 2013-09-10 NOTE — Progress Notes (Signed)
I have sent the following InBasket message:  "Hello Dr. Charlesetta Ivory,    The Nurse Navigators have been asked to condense the patient's we are following.  Since the patient is managing the Diabetes, I will not be able to continue to follow the patient.  If you find the clinical condition of the patient changes, please refer back to me.     Thank you,    Lenda Kelp, RN Nurse Navigator [phone]"

## 2013-09-25 ENCOUNTER — Other Ambulatory Visit (INDEPENDENT_AMBULATORY_CARE_PROVIDER_SITE_OTHER): Payer: Self-pay | Admitting: Internal Medicine

## 2013-09-25 DIAGNOSIS — E669 Obesity, unspecified: Secondary | ICD-10-CM

## 2013-09-25 DIAGNOSIS — E1169 Type 2 diabetes mellitus with other specified complication: Secondary | ICD-10-CM

## 2013-09-25 MED ORDER — GLIPIZIDE ER 5 MG PO TB24
5.0000 mg | ORAL_TABLET | Freq: Every day | ORAL | Status: DC
Start: 2013-09-25 — End: 2013-10-31

## 2013-09-30 ENCOUNTER — Ambulatory Visit (INDEPENDENT_AMBULATORY_CARE_PROVIDER_SITE_OTHER): Payer: HMO | Admitting: Internal Medicine

## 2013-09-30 ENCOUNTER — Other Ambulatory Visit (INDEPENDENT_AMBULATORY_CARE_PROVIDER_SITE_OTHER): Payer: Self-pay | Admitting: Internal Medicine

## 2013-09-30 DIAGNOSIS — I10 Essential (primary) hypertension: Secondary | ICD-10-CM

## 2013-09-30 MED ORDER — DILTIAZEM HCL ER COATED BEADS 300 MG PO CP24
300.0000 mg | ORAL_CAPSULE | Freq: Every day | ORAL | Status: DC
Start: 2013-09-30 — End: 2013-10-04

## 2013-09-30 NOTE — Telephone Encounter (Signed)
Patient missed her appointment today.  Please advise her to schedule a follow-up appointment for diabetes and hypertension (may need to schedule w/ other provider as I will be out of the office for 2 weeks).  She will need to have a translator (Spanish to [place]) if she decides to do this.  Will send rx for Cardizem for #30 days w/o refills until she can come for f/u appt.  Thanks.

## 2013-10-04 ENCOUNTER — Telehealth (INDEPENDENT_AMBULATORY_CARE_PROVIDER_SITE_OTHER): Payer: Self-pay | Admitting: Internal Medicine

## 2013-10-04 ENCOUNTER — Other Ambulatory Visit (INDEPENDENT_AMBULATORY_CARE_PROVIDER_SITE_OTHER): Payer: Self-pay | Admitting: Internal Medicine

## 2013-10-04 DIAGNOSIS — I10 Essential (primary) hypertension: Secondary | ICD-10-CM

## 2013-10-04 MED ORDER — DILTIAZEM HCL ER COATED BEADS 300 MG PO CP24
300.0000 mg | ORAL_CAPSULE | Freq: Every day | ORAL | Status: DC
Start: 2013-10-04 — End: 2013-10-31

## 2013-10-04 NOTE — Telephone Encounter (Signed)
rx for diltiazem was sent to pharmacy on file for #30 days w/o refills

## 2013-10-04 NOTE — Telephone Encounter (Signed)
Patient is calling asking for a refill for Cartia XT.  Patient has an appointment on 9/24 at 11:15am with Dr. Yancey Flemings.  Please call patient when called in.

## 2013-10-29 ENCOUNTER — Other Ambulatory Visit (INDEPENDENT_AMBULATORY_CARE_PROVIDER_SITE_OTHER): Payer: Self-pay | Admitting: Internal Medicine

## 2013-10-29 DIAGNOSIS — E669 Obesity, unspecified: Secondary | ICD-10-CM

## 2013-10-29 NOTE — Telephone Encounter (Signed)
Patient has f/u appt scheduled later this week, will fill medications during that visit.

## 2013-10-31 ENCOUNTER — Other Ambulatory Visit (INDEPENDENT_AMBULATORY_CARE_PROVIDER_SITE_OTHER): Payer: Self-pay | Admitting: Internal Medicine

## 2013-10-31 ENCOUNTER — Ambulatory Visit (INDEPENDENT_AMBULATORY_CARE_PROVIDER_SITE_OTHER): Payer: HMO | Admitting: Internal Medicine

## 2013-10-31 ENCOUNTER — Other Ambulatory Visit: Payer: HMO

## 2013-10-31 ENCOUNTER — Encounter (INDEPENDENT_AMBULATORY_CARE_PROVIDER_SITE_OTHER): Payer: Self-pay | Admitting: Internal Medicine

## 2013-10-31 VITALS — BP 142/69 | HR 75 | Temp 98.7°F | Ht 63.45 in | Wt 200.0 lb

## 2013-10-31 DIAGNOSIS — M79672 Pain in left foot: Secondary | ICD-10-CM

## 2013-10-31 DIAGNOSIS — I10 Essential (primary) hypertension: Secondary | ICD-10-CM

## 2013-10-31 DIAGNOSIS — M79609 Pain in unspecified limb: Secondary | ICD-10-CM

## 2013-10-31 DIAGNOSIS — E785 Hyperlipidemia, unspecified: Secondary | ICD-10-CM

## 2013-10-31 DIAGNOSIS — E1139 Type 2 diabetes mellitus with other diabetic ophthalmic complication: Secondary | ICD-10-CM

## 2013-10-31 DIAGNOSIS — E1169 Type 2 diabetes mellitus with other specified complication: Secondary | ICD-10-CM

## 2013-10-31 DIAGNOSIS — Z23 Encounter for immunization: Secondary | ICD-10-CM

## 2013-10-31 DIAGNOSIS — E11319 Type 2 diabetes mellitus with unspecified diabetic retinopathy without macular edema: Secondary | ICD-10-CM

## 2013-10-31 DIAGNOSIS — E119 Type 2 diabetes mellitus without complications: Secondary | ICD-10-CM

## 2013-10-31 DIAGNOSIS — E669 Obesity, unspecified: Secondary | ICD-10-CM

## 2013-10-31 MED ORDER — LISINOPRIL 40 MG PO TABS
40.0000 mg | ORAL_TABLET | Freq: Every day | ORAL | Status: DC
Start: 2013-10-31 — End: 2014-02-14

## 2013-10-31 MED ORDER — GLIPIZIDE ER 5 MG PO TB24
5.0000 mg | ORAL_TABLET | Freq: Every day | ORAL | Status: DC
Start: 2013-10-31 — End: 2014-02-14

## 2013-10-31 MED ORDER — INSULIN SYRINGE 30G X 5/16" 1 ML MISC
Status: DC
Start: 2013-10-31 — End: 2014-02-14

## 2013-10-31 MED ORDER — ATENOLOL 50 MG PO TABS
50.0000 mg | ORAL_TABLET | Freq: Every day | ORAL | Status: DC
Start: 2013-10-31 — End: 2013-10-31

## 2013-10-31 MED ORDER — METFORMIN HCL 1000 MG PO TABS
1000.0000 mg | ORAL_TABLET | Freq: Two times a day (BID) | ORAL | Status: DC
Start: 2013-10-31 — End: 2014-02-14

## 2013-10-31 MED ORDER — GLUCOSE BLOOD VI STRP
ORAL_STRIP | Status: DC
Start: 2013-10-31 — End: 2014-05-16

## 2013-10-31 MED ORDER — INSULIN NPH ISOPHANE & REGULAR (70-30) 100 UNIT/ML SC SUSP
SUBCUTANEOUS | Status: DC
Start: 2013-10-31 — End: 2014-01-03

## 2013-10-31 MED ORDER — PRAVASTATIN SODIUM 20 MG PO TABS
20.0000 mg | ORAL_TABLET | Freq: Every day | ORAL | Status: DC
Start: 2013-10-31 — End: 2014-02-14

## 2013-10-31 MED ORDER — DILTIAZEM HCL ER COATED BEADS 300 MG PO CP24
300.0000 mg | ORAL_CAPSULE | Freq: Every day | ORAL | Status: DC
Start: 2013-10-31 — End: 2014-02-14

## 2013-10-31 MED ORDER — ATENOLOL 50 MG PO TABS
75.0000 mg | ORAL_TABLET | Freq: Every day | ORAL | Status: DC
Start: 2013-10-31 — End: 2014-02-14

## 2013-10-31 NOTE — Progress Notes (Signed)
Subjective:       Patient ID: [name] is a 58 y.o. female.  Seen in clinic on October 31, 2013  Encounter was performed in Spanish  Chief Complaint   Patient presents with   . Follow-up     ok w/ [facility] caremanager contacting - best on Wednesdays   . Hypertension     home BP's in high 130's/80'smmHg   . Diabetes     home morning blood glucose in 80's to 100's, pre-bedtime glucose 180's   . Hyperlipidemia   foot pain    HPI   Patient is a 58 year old Hispanic female with history of diabetes mellitus 2, hypertension, hyperlipidemia who presents today for followup of the above.    In regards to her diabetes, her last mobility and A1c was done in May and was 7.7%.  She reports compliance with Novolin insulin 60 units in the morning and 32 units in the evening.  She also reports compliance with metformin and glipizide.  She denies any polydipsia, polyuria, extubated numbness or tingling.  She reports she has been checking her blood sugars about 2 times a day, usually around 80s to 100s in the morning and prebedtime blood glucose is around 180s or so.  States he has had an eye exam over the last year.    In regards to her hypertension, she reports compliance with all antihypertensive medications.  She reports her blood pressures at home are usually in the 130 systolic and 80s diastolic.  Denies any frequent headaches, chest pain, palpitations, shortness of breath, lower extremity edema.    Her last at the panel done in May 2015 showed acceptable control.  She reports compliance with pravastatin 20 mg daily and denies any frequent myalgias or abdominal pain.    She has been having left-sided foot pain on the sole of her foot, which seems to be worse towards the heel and is worse with standing.  She has not noticed any redness, swelling, or warmth of the area.  She denies any trauma or falls.    The following portions of the patient's history were reviewed and updated as appropriate: allergies, current  medications, past medical history, past social history and problem list.     Past Medical History   Diagnosis Date   . Glaucoma 06/01/2012   . Diabetes mellitus type 2 in obese    . Hypertension    . Hyperlipidemia    . Obesity    . Diabetic retinopathy 06/28/2013     Of the right eye, per patient       History     Social History   . Marital Status: Single     Spouse Name: N/A     Number of Children: 3   . Years of Education: N/A     Occupational History   . Maintenance      Social History Main Topics   . Smoking status: Never Smoker    . Smokeless tobacco: Never Used   . Alcohol Use: No   . Drug Use: No   . Sexual Activity: Not Currently     Other Topics Concern   . Not on file     Social History Narrative    Originally from [place].  Lives with her daughter.    Exercise: walks frequently       No Known Allergies    Current Outpatient Prescriptions   Medication Sig Dispense Refill   . aspirin 81 MG tablet Take 81 mg  by mouth daily.       Marland Kitchen atenolol (TENORMIN) 50 MG tablet Take 1 tablet (50 mg total) by mouth daily. 90 tablet 0   . Calcium Carbonate-Vitamin D (CALCIUM-D PO) Take 1 tablet by mouth daily.     Marland Kitchen diltiazem (CARDIZEM CD) 300 MG 24 hr capsule Take 1 capsule (300 mg total) by mouth daily. 30 capsule 0   . glipiZIDE XL (GLUCOTROL XL) 5 MG 24 hr tablet Take 1 tablet (5 mg total) by mouth daily. 90 tablet 0   . glucose blood (ONE TOUCH ULTRA TEST) test strip Measure blood glucose 2 times a day 100 each 1   . insulin NPH-insulin regular (HUMULIN,NOVOLIN 70/30) (70-30) 100 UNIT/ML injection Take 68 units subcutaneously in the morning and 32 units in the evening 30 mL 2   . Insulin Syringe-Needle U-100 (INSULIN SYRINGE 1CC/30GX5/16") 30G X 5/16" 1 ML Misc Use syringe for insulin injection 2 times a day 100 each 1   . latanoprost (XALATAN) 0.005 % ophthalmic solution      . lisinopril (PRINIVIL,ZESTRIL) 40 MG tablet Take 1 tablet (40 mg total) by mouth daily. 90 tablet 0   . metFORMIN (GLUCOPHAGE) 1000 MG tablet  Take 1 tablet (1,000 mg total) by mouth 2 (two) times daily with meals. 180 tablet 0   . ONETOUCH DELICA LANCETS 33G Misc      . pravastatin (PRAVACHOL) 20 MG tablet Take 1 tablet (20 mg total) by mouth daily. 90 tablet 0     No current facility-administered medications for this visit.         Review of Systems   Constitutional: Negative for fatigue.   Respiratory: Negative for chest tightness and shortness of breath.    Cardiovascular: Negative for chest pain and palpitations.   Gastrointestinal: Negative for nausea, vomiting, abdominal pain, diarrhea and constipation.   Endocrine: Negative for polydipsia and polyuria.   Genitourinary: Negative for dysuria, frequency and hematuria.   Neurological: Negative for light-headedness and headaches.           Objective:    Physical Exam   Constitutional: She is oriented to person, place, and time. She appears well-developed. No distress.   Morbidly obese   HENT:   Moist oral membranes   Cardiovascular: Normal rate and regular rhythm.  Exam reveals no gallop and no friction rub.    No murmur heard.  Pulmonary/Chest: Effort normal. No respiratory distress. She has no wheezes. She has no rales.   Abdominal: Soft. There is no tenderness.   Musculoskeletal:   There is no visible erythema, warmth, or skin damage and evaluation of the left foot.  There is no producible tenderness on deep palpation of the sole of the foot or heel.   Neurological: She is alert and oriented to person, place, and time.   Skin: Skin is warm and dry. She is not diaphoretic.   Vitals reviewed.    BP 142/69 mmHg  Pulse 75  Temp(Src) 98.7 F (37.1 C) (Oral)  Ht 1.612 m (5' 3.45")  Wt 90.719 kg (200 lb)  BMI 34.91 kg/m2    Lab Results   Component Value Date    HGBA1C 7.7* 06/28/2013     Lab Results   Component Value Date    WBC 9.94 02/17/2011    HGB 14.0 02/17/2011    HCT 40.6 02/17/2011    MCV 89.8 02/17/2011    PLT 237 02/17/2011     '  Chemistry  Component Value Date/Time    NA 139  06/28/2013 0000    K 5.0 06/28/2013 0000    CL 102 06/28/2013 0000    CL 100 09/27/2012 0801    CO2 27 06/28/2013 0000    BUN 14 06/28/2013 0000    CREAT 0.67 06/28/2013 0000    CREAT 0.55* 09/27/2012 0801    CREAT 0.7 02/17/2011 0410    GLU 136* 06/28/2013 0000    GLU 149* 09/27/2012 0801        Component Value Date/Time    CA 9.6 06/28/2013 0000    ALKPHOS 96 06/28/2013 0000    AST 43* 06/28/2013 0000    ALT 42* 06/28/2013 0000    BILITOTAL 0.3 06/28/2013 0000        Lab Results   Component Value Date    CHOL 157 06/28/2013    CHOL 174 03/29/2013    CHOL 170 06/06/2012     Lab Results   Component Value Date    HDL 70 06/28/2013    HDL 69 03/29/2013    HDL 47 06/06/2012     Lab Results   Component Value Date    LDL 66 06/28/2013    LDL 87 03/29/2013    LDL 104* 06/06/2012     Lab Results   Component Value Date    TRIG 103 06/28/2013    TRIG 92 03/29/2013    TRIG 126 02/18/2011     Lab Results   Component Value Date    TSH 1.17 06/28/2013    TSH 0.58 02/17/2011           Assessment:       1. Type 2 diabetes mellitus with diabetic retinopathy, macular edema presence unspecified, with unspecified retinopathy severity  Hemoglobin A1C    Comprehensive metabolic panel    Creatinine, urine, random    Microalbumin, Random Urine    glipiZIDE XL (GLUCOTROL XL) 5 MG 24 hr tablet    glucose blood (ONE TOUCH ULTRA TEST) test strip    insulin NPH-insulin regular (HUMULIN,NOVOLIN 70/30) (70-30) 100 UNIT/ML injection    Insulin Syringe-Needle U-100 (INSULIN SYRINGE 1CC/30GX5/16") 30G X 5/16" 1 ML Misc    metFORMIN (GLUCOPHAGE) 1000 MG tablet   2. Diabetes mellitus type 2 in obese     3. Essential hypertension  Comprehensive metabolic panel    diltiazem (CARDIZEM CD) 300 MG 24 hr capsule    lisinopril (PRINIVIL,ZESTRIL) 40 MG tablet    atenolol (TENORMIN) 50 MG tablet    DISCONTINUED: atenolol (TENORMIN) 50 MG tablet   4. Hyperlipidemia  Comprehensive metabolic panel    pravastatin (PRAVACHOL) 20 MG tablet   5. Left foot pain   Ambulatory referral to Podiatry   6. Need for influenza vaccination  Flu vacc QUAD PRES FREE 3 YRS & GREATER           Plan:       Diabetes mellitus 2 - under suboptimal control with last hemoglobin A1c above goal of under 7%.  We strongly encourage patient to lose weight and also monitor her carbohydrate intake.  We have also encouraged her to exercise regularly as tolerated.  For now we will continue current insulin, metformin, and we also advise patient to discontinue glipizide we will check a hemoglobin A1c to reevaluate status and will also check urine microalbumin and creatinine    Hypertension - well controlled with blood pressures at home close to goal of under 130/80 mmHg.  We will continue current antihypertensives and have  encouraged patient to follow a low-sodium diet and also exercise regularly.    Hyperlipidemia is well controlled on last check.  We will check the above chemistries.  We have encouraged patient to follow a heart healthy diet.  We will continue current Pravachol 20 mg daily.    Referral to be seen by podiatry for additional evaluation of left foot pain and was given    Influenza vaccine was administered today in clinic    Patient to return to clinic in 3 months for followup of diabetes, hypertension, and hyperlipidemia or sooner as needed.  Procedures

## 2013-10-31 NOTE — Patient Instructions (Signed)
[facility]  [address], [address],   [place], [place], [address]

## 2013-11-01 LAB — COMPREHENSIVE METABOLIC PANEL
ALT: 50 IU/L — ABNORMAL HIGH (ref 0–32)
AST (SGOT): 42 IU/L — ABNORMAL HIGH (ref 0–40)
Albumin/Globulin Ratio: 1.5 (ref 1.1–2.5)
Albumin: 4.1 g/dL (ref 3.5–5.5)
Alkaline Phosphatase: 95 IU/L (ref 39–117)
BUN / Creatinine Ratio: 23 (ref 9–23)
BUN: 17 mg/dL (ref 6–24)
Bilirubin, Total: 0.3 mg/dL (ref 0.0–1.2)
CO2: 23 mmol/L (ref 18–29)
Calcium: 9 mg/dL (ref 8.7–10.2)
Chloride: 102 mmol/L (ref 97–108)
Creatinine: 0.73 mg/dL (ref 0.57–1.00)
EGFR: 105 mL/min/{1.73_m2} (ref >59)
EGFR: 91 mL/min/{1.73_m2} (ref >59)
Globulin, Total: 2.8 g/dL (ref 1.5–4.5)
Glucose: 83 mg/dL (ref 65–99)
Potassium: 4.4 mmol/L (ref 3.5–5.2)
Protein, Total: 6.9 g/dL (ref 6.0–8.5)
Sodium: 142 mmol/L (ref 134–144)

## 2013-11-01 LAB — CREATININE, URINE, RANDOM: Creatinine, UR: 130.6 mg/dL (ref 15.0–278.0)

## 2013-11-01 LAB — MICROALBUMIN, RANDOM URINE WITHOUT CREATININE: Microalbumin, UR: 20.8 ug/mL — ABNORMAL HIGH (ref 0.0–17.0)

## 2013-11-01 LAB — HEMOGLOBIN A1C: Hemoglobin A1C: 8.5 % — ABNORMAL HIGH (ref 4.8–5.6)

## 2013-11-05 ENCOUNTER — Telehealth (INDEPENDENT_AMBULATORY_CARE_PROVIDER_SITE_OTHER): Payer: Self-pay | Admitting: Internal Medicine

## 2013-11-05 NOTE — Telephone Encounter (Signed)
All prescriptions sent into [facility] on 10/31/2013 was never received at the pharmacy.   Medications called to Synetta Fail at [facility]  Diltiazem, Glipizide, NPH-70/30, syringes, metformin, lisinopril, pravastatin, atenolol and Once touch testing strips.

## 2013-11-05 NOTE — Telephone Encounter (Signed)
Pt called stating that she was seen on 10/31/2013 and that all her meds were supposed to be sent to the [facility] - [place], [place] - [phone], pt is unsure of the names of the meds, but she did mention needing refills for her blood pressure medication, cholesterol, insulins, and test strips, she stated that she is out of all her meds.  Pt would like a call back asap at phone # [phone] once meds are sent to her pharmacy.

## 2013-11-05 NOTE — Telephone Encounter (Signed)
Patient was notified via voice mail that Rx(s) were called to [place] at  [facility]. She was encouraged to follow up with her pharmacy later this evening regarding the status

## 2013-11-05 NOTE — Telephone Encounter (Signed)
Called back and spoke w/ patient's daughter.  Notified her that prescriptions were again called in verbally today by nursing and patient should be able to pick up medications today.  Daughter verbalized understanding.

## 2013-11-05 NOTE — Telephone Encounter (Signed)
Pt called again, stating that she really needs all her meds sent to the [facility] on file and they wants a call asap, once it has been sent so that she is aware, pt states she is completley out of most of her meds including the blood pressure one, and that she was supposed to have gotten them sent when she was seen on 10/31/2013.

## 2013-11-19 ENCOUNTER — Encounter (INDEPENDENT_AMBULATORY_CARE_PROVIDER_SITE_OTHER): Payer: Self-pay

## 2014-01-03 ENCOUNTER — Other Ambulatory Visit (INDEPENDENT_AMBULATORY_CARE_PROVIDER_SITE_OTHER): Payer: Self-pay | Admitting: Internal Medicine

## 2014-01-03 DIAGNOSIS — E11319 Type 2 diabetes mellitus with unspecified diabetic retinopathy without macular edema: Secondary | ICD-10-CM

## 2014-01-03 NOTE — Telephone Encounter (Signed)
Patient was last see on 10/31/2013. She also completed her blood work on that day as well.

## 2014-01-06 MED ORDER — INSULIN NPH ISOPHANE & REGULAR (70-30) 100 UNIT/ML SC SUSP
SUBCUTANEOUS | Status: DC
Start: 2014-01-06 — End: 2014-02-14

## 2014-01-27 ENCOUNTER — Ambulatory Visit (INDEPENDENT_AMBULATORY_CARE_PROVIDER_SITE_OTHER): Payer: HMO | Admitting: Internal Medicine

## 2014-02-14 ENCOUNTER — Encounter (INDEPENDENT_AMBULATORY_CARE_PROVIDER_SITE_OTHER): Payer: Self-pay | Admitting: Internal Medicine

## 2014-02-14 ENCOUNTER — Ambulatory Visit (INDEPENDENT_AMBULATORY_CARE_PROVIDER_SITE_OTHER): Payer: HMO | Admitting: Internal Medicine

## 2014-02-14 ENCOUNTER — Other Ambulatory Visit: Payer: HMO

## 2014-02-14 VITALS — BP 128/67 | HR 63 | Temp 98.3°F | Wt 202.6 lb

## 2014-02-14 DIAGNOSIS — E11319 Type 2 diabetes mellitus with unspecified diabetic retinopathy without macular edema: Secondary | ICD-10-CM

## 2014-02-14 DIAGNOSIS — I10 Essential (primary) hypertension: Secondary | ICD-10-CM

## 2014-02-14 DIAGNOSIS — I340 Nonrheumatic mitral (valve) insufficiency: Secondary | ICD-10-CM | POA: Insufficient documentation

## 2014-02-14 MED ORDER — ATENOLOL 50 MG PO TABS
75.0000 mg | ORAL_TABLET | Freq: Every day | ORAL | Status: DC
Start: 2014-02-14 — End: 2014-05-16

## 2014-02-14 MED ORDER — LISINOPRIL 40 MG PO TABS
40.0000 mg | ORAL_TABLET | Freq: Every day | ORAL | Status: DC
Start: 2014-02-14 — End: 2014-05-16

## 2014-02-14 MED ORDER — INSULIN SYRINGE 30G X 5/16" 1 ML MISC
Status: DC
Start: 2014-02-14 — End: 2014-05-16

## 2014-02-14 MED ORDER — METFORMIN HCL 1000 MG PO TABS
1000.0000 mg | ORAL_TABLET | Freq: Two times a day (BID) | ORAL | Status: DC
Start: 2014-02-14 — End: 2014-05-16

## 2014-02-14 MED ORDER — INSULIN NPH ISOPHANE & REGULAR (70-30) 100 UNIT/ML SC SUSP
SUBCUTANEOUS | Status: DC
Start: 2014-02-14 — End: 2014-05-16

## 2014-02-14 MED ORDER — DILTIAZEM HCL ER COATED BEADS 300 MG PO CP24
300.0000 mg | ORAL_CAPSULE | Freq: Every day | ORAL | Status: DC
Start: 2014-02-14 — End: 2014-05-16

## 2014-02-14 NOTE — Progress Notes (Signed)
Subjective:       Patient ID: [name] is a 59 y.o. female.  Seen in clinic on January 2016  Chief Complaint   Patient presents with   . Diabetes     fasting glucose 90's-100's; evening BG prior to insulin in 180's; does not bring in meter   . Hypertension     home BP's in 120's-130's/60'smmHg   . Hyperlipidemia     changed from pravastatin to atorvastatin by cardiology       HPI   Patient is a 59 year old Hispanic female with history of obesity, uncontrolled diabetes mellitus 2, hypertension, hyperlipidemia, diabetic retinopathy and fatty liver among others who presents today for followup of the above.    Since her last visit she was seen by cardiologist Dr. Fransisco Beau and was found to have mitral valve regurgitation.  She was also changed from pravastatin to atorvastatin and reports she has been doing well on the medication.    Regarding her diabetes, her last hemoglobin A1c done in September 2015 showed poor control and was over 9%.  She reports compliance with metformin 1000 mg 2 times a day, glipizide and also Humulin insulin 62 units in the morning and 32 units in the evening.  She denies any polydipsia or polyuria but does admit to occasional numbness and tingling in her hands and feet.  She has had an eye exam over the last year.  She reports she has been checking her blood glucose in the morning fasting which has apparently been in the 90s to 100s and states that her evening blood glucose prior to her evening insulin is in the 180s or so.  She did not bring in her meter today.    In regards to her hypertension, she reports compliance with atenolol 50 mg daily, diltiazem 300 mg daily and lisinopril 40 mg daily.  She denies any frequent headaches, chest pain, palpitations, shortness of breath, lower extremity edema or dyspnea on exertion.  She reports she checks her blood pressures at home and states these are usually in the 120s to 130 systolic and 60s diastolic.    The following portions of the patient's  history were reviewed and updated as appropriate: allergies, current medications, past medical history, past social history and problem list.     Past Medical History   Diagnosis Date   . Glaucoma 06/01/2012   . Diabetes mellitus type 2 in obese    . Hypertension    . Hyperlipidemia    . Obesity    . Diabetic retinopathy 06/28/2013     Of the right eye, per patient       History     Social History   . Marital Status: Single     Spouse Name: N/A     Number of Children: 3   . Years of Education: N/A     Occupational History   . Maintenance      Social History Main Topics   . Smoking status: Never Smoker    . Smokeless tobacco: Never Used   . Alcohol Use: No   . Drug Use: No   . Sexual Activity: Not Currently     Other Topics Concern   . Not on file     Social History Narrative    Originally from [place].  Lives with her daughter.    Exercise: walks frequently       No Known Allergies    Current Outpatient Prescriptions   Medication Sig Dispense Refill   .  aspirin 81 MG tablet Take 81 mg by mouth daily.       Marland Kitchen atenolol (TENORMIN) 50 MG tablet Take 1.5 tablets (75 mg total) by mouth daily. 135 tablet 0   . atorvastatin (LIPITOR) 20 MG tablet Take 20 mg by mouth daily.     . Calcium Carbonate-Vitamin D (CALCIUM-D PO) Take 1 tablet by mouth daily.     Marland Kitchen diltiazem (CARDIZEM CD) 300 MG 24 hr capsule Take 1 capsule (300 mg total) by mouth daily. 90 capsule 0   . glipiZIDE XL (GLUCOTROL XL) 5 MG 24 hr tablet Take 1 tablet (5 mg total) by mouth daily. 90 tablet 0   . glucose blood (ONE TOUCH ULTRA TEST) test strip Measure blood glucose 2 times a day 100 each 1   . insulin NPH-insulin regular (HUMULIN,NOVOLIN 70/30) (70-30) 100 UNIT/ML injection Take 68 units subcutaneously in the morning and 32 units in the evening 30 mL 2   . Insulin Syringe-Needle U-100 (INSULIN SYRINGE 1CC/30GX5/16") 30G X 5/16" 1 ML Misc Use syringe for insulin injection 2 times a day 100 each 1   . latanoprost (XALATAN) 0.005 % ophthalmic solution      .  lisinopril (PRINIVIL,ZESTRIL) 40 MG tablet Take 1 tablet (40 mg total) by mouth daily. 90 tablet 0   . metFORMIN (GLUCOPHAGE) 1000 MG tablet Take 1 tablet (1,000 mg total) by mouth 2 (two) times daily with meals. 180 tablet 0   . ONETOUCH DELICA LANCETS 33G Misc        No current facility-administered medications for this visit.       Review of Systems   Constitutional: Negative for fatigue.   Respiratory: Negative for chest tightness and shortness of breath.    Cardiovascular: Negative for chest pain and palpitations.   Gastrointestinal: Negative for nausea, vomiting, abdominal pain, diarrhea and constipation.   Genitourinary: Negative for dysuria, frequency and hematuria.   Neurological: Positive for numbness (occasional numbness on hands, not on feet). Negative for dizziness, light-headedness and headaches.   Psychiatric/Behavioral: Negative for dysphoric mood. The patient is not nervous/anxious.          Objective:    Physical Exam   Constitutional: She is oriented to person, place, and time. She appears well-developed. No distress.   obese   HENT:   Moist oral mucous membranes   Eyes: Conjunctivae are normal.   Cardiovascular: Normal rate and regular rhythm.  Exam reveals no gallop and no friction rub.    Murmur (2/6 systolic murmur heard at left sternal border) heard.  Pulmonary/Chest: Effort normal. No respiratory distress. She has no wheezes. She has no rales.   Abdominal: Soft. She exhibits no mass. There is no tenderness.   Musculoskeletal: Edema: trace bilateral lower exremity edema at ankles.   Neurological: She is alert and oriented to person, place, and time.   Skin: She is not diaphoretic.   Psychiatric: Her behavior is normal.   Vitals reviewed.    BP 128/67 mmHg  Pulse 63  Temp(Src) 98.3 F (36.8 C) (Oral)  Wt 91.899 kg (202 lb 9.6 oz)    Lab Results   Component Value Date    HGBA1C 8.5* 10/31/2013     Lab Results   Component Value Date    WBC 9.94 02/17/2011    HGB 14.0 02/17/2011    HCT 40.6  02/17/2011    MCV 89.8 02/17/2011    PLT 237 02/17/2011     '  Chemistry  Component Value Date/Time    NA 142 10/31/2013 1127    K 4.4 10/31/2013 1127    CL 102 10/31/2013 1127    CL 102 06/28/2013 0000    CO2 23 10/31/2013 1127    BUN 17 10/31/2013 1127    CREAT 0.73 10/31/2013 1127    CREAT 0.67 06/28/2013 0000    CREAT 0.7 02/17/2011 0410    GLU 83 10/31/2013 1127    GLU 136* 06/28/2013 0000        Component Value Date/Time    CA 9.0 10/31/2013 1127    ALKPHOS 95 10/31/2013 1127    AST 42* 10/31/2013 1127    ALT 50* 10/31/2013 1127    BILITOTAL 0.3 10/31/2013 1127        Lab Results   Component Value Date    CHOL 157 06/28/2013    CHOL 174 03/29/2013    CHOL 170 06/06/2012     Lab Results   Component Value Date    HDL 70 06/28/2013    HDL 69 03/29/2013    HDL 47 06/06/2012     Lab Results   Component Value Date    LDL 66 06/28/2013    LDL 87 03/29/2013    LDL 104* 06/06/2012     Lab Results   Component Value Date    TRIG 103 06/28/2013    TRIG 92 03/29/2013    TRIG 95 06/06/2012     Lab Results   Component Value Date    TSH 1.17 06/28/2013    TSH 0.58 02/17/2011           Assessment:       1. Type 2 diabetes mellitus with diabetic retinopathy, macular edema presence unspecified, with unspecified retinopathy severity  insulin NPH-insulin regular (HUMULIN,NOVOLIN 70/30) (70-30) 100 UNIT/ML injection    Hemoglobin A1C    Comprehensive metabolic panel    metFORMIN (GLUCOPHAGE) 1000 MG tablet    Insulin Syringe-Needle U-100 (INSULIN SYRINGE 1CC/30GX5/16") 30G X 5/16" 1 ML Misc   2. Essential hypertension  Comprehensive metabolic panel    lisinopril (PRINIVIL,ZESTRIL) 40 MG tablet    diltiazem (CARDIZEM CD) 300 MG 24 hr capsule    atenolol (TENORMIN) 50 MG tablet   3. Non-rheumatic mitral regurgitation              Plan:       DM2 - Uncontrolled. Will discontinue glipizide.  Increase Humulin from 68 to 72 units in the morning and from 32 to 34 units in the evening.  Continue current metformin 1000mg  BID.   Encouraged weight loss and monitoring carbonydrate intake.  Patient not interested in meeting w/ nutritionist. Advised annual eye exam and routine self-foot exams.     Hypertension - Controlled w/ BP at goal of <130/30mmHg.  Continue current lisinopril, diltitazem and atenolol.  Encourage low-sodium diet.     Continue f/u with cardiology for mitral valve regurgitation.  Now on Lipitor per patient's cardiologist.     RTC in 3 months for f/u diabetes, sooner prn.    Procedures

## 2014-02-14 NOTE — Patient Instructions (Addendum)
Descontinuar glipizide    Aumentar insulina de por la manana de 68 a 72 unidades.  Aumentar insulina de por la tarde de 32 unidades a 34 unidades.

## 2014-02-15 LAB — HEMOGLOBIN A1C: Hemoglobin A1C: 8.1 % of total Hgb — ABNORMAL HIGH (ref <5.7)

## 2014-02-15 LAB — COMPREHENSIVE METABOLIC PANEL
ALT: 62 U/L — ABNORMAL HIGH (ref 6–29)
AST (SGOT): 54 U/L — ABNORMAL HIGH (ref 10–35)
Albumin/Globulin Ratio: 1.3 (ref 1.0–2.5)
Albumin: 4 G/DL (ref 3.6–5.1)
Alkaline Phosphatase: 107 U/L (ref 33–130)
BUN: 20 MG/DL (ref 7–25)
Bilirubin, Total: 0.4 MG/DL (ref 0.2–1.2)
CO2: 27 mmol/L (ref 19–30)
Calcium: 9.3 MG/DL (ref 8.6–10.4)
Chloride: 104 mmol/L (ref 98–110)
Creatinine: 0.78 mg/dL (ref 0.50–1.05)
EGFR African American: 97 mL/min/{1.73_m2} (ref >=60)
EGFR: 84 mL/min/{1.73_m2} (ref >=60)
Globulin: 3.2 G/DL (ref 1.9–3.7)
Glucose: 157 MG/DL — ABNORMAL HIGH (ref 65–99)
Potassium: 5 mmol/L (ref 3.5–5.3)
Protein, Total: 7.2 G/DL (ref 6.1–8.1)
Sodium: 139 mmol/L (ref 135–146)

## 2014-02-21 ENCOUNTER — Ambulatory Visit (INDEPENDENT_AMBULATORY_CARE_PROVIDER_SITE_OTHER): Payer: HMO

## 2014-02-21 ENCOUNTER — Ambulatory Visit (INDEPENDENT_AMBULATORY_CARE_PROVIDER_SITE_OTHER): Payer: HMO | Admitting: Family Medicine

## 2014-02-21 ENCOUNTER — Encounter (INDEPENDENT_AMBULATORY_CARE_PROVIDER_SITE_OTHER): Payer: Self-pay | Admitting: Family Medicine

## 2014-02-21 VITALS — BP 180/80 | HR 70 | Temp 97.9°F | Resp 14 | Ht 65.0 in | Wt 205.0 lb

## 2014-02-21 DIAGNOSIS — S20211A Contusion of right front wall of thorax, initial encounter: Secondary | ICD-10-CM

## 2014-02-21 DIAGNOSIS — S2241XA Multiple fractures of ribs, right side, initial encounter for closed fracture: Secondary | ICD-10-CM

## 2014-02-21 MED ORDER — OXYCODONE-ACETAMINOPHEN 5-325 MG PO TABS
1.0000 | ORAL_TABLET | Freq: Four times a day (QID) | ORAL | Status: DC | PRN
Start: 2014-02-21 — End: 2014-05-16

## 2014-02-21 NOTE — Patient Instructions (Addendum)
Fractura de Chiropractor (Costilla partida)  Las costillas son unos huesos curvos situados en el pecho. Su funcin es proteger a los pulmones expandindose y Charity fundraiser respiracin. Las [employer] de los nios se doblan fcilmente y a Agricultural consultant un golpe o una cada. Pero las [employer] adultos son ms propensas a partirse (fracturarse) cuando se someten a presin. Incluso un acceso de tos o estornudo fuerte pueden causar una fractura de [place].    Cundo debe acudir a la sala de emergencias ("ER" por su sigla en ingls)  Aunque pueden ser dolorosas, en su mayora las fracturas de [place] no son graves. Pero a menudo hacen difcil toser o Quarry manager. Busque atencin mdica de inmediato si tiene:   Dificultad para respirar.   Nusea, vmito o dolor de Product manager un dolor o Agricultural consultant en [place].   Dolor que empeora con el [place].   Una lesin en el pecho o abdomen.  Qu puede esperar en la ER   Un mdico le har preguntas sobre su lesin y lo examinar detenidamente.   Probablemente le harn una radiografa del pecho para ver si hay algn dao en las costillas o ciertos tejidos blandos.   Podran darle medicamentos para [employer].   En raras ocasiones, las fracturas de costilla pueden causar el colapso de un pulmn o una hemorragia en el pecho. En estos casos, se inserta un tubo en el pecho para volver a inflar el pulmn o Art therapist.  Atencin posterior  Es probable que usted tarde entre 6 y 8 semanas en recuperarse. La mayora de las fracturas de costillas se sueldan por s solas, sin causar efectos duraderos. Pero llame a su mdico en el acto si experimenta cualquiera de estos sntomas:   Aumento del dolor en el pecho   Falta de aliento   Fiebre   Tos con sangre   2000-2015 [employer], [place]. [address] [place], [place] [address]. Todos los derechos reservados. Esta informacin no pretende sustituir la atencin Loss adjuster, chartered. Slo su mdico puede diagnosticar y tratar un problema de salud.

## 2014-02-21 NOTE — Progress Notes (Signed)
Subjective:       Patient ID: [name] is a 59 y.o. female.    Fall  The accident occurred 3 to 5 days ago. The fall occurred while running (running late fro the [employer] and fell down). She fell from a height of 1 to 2 ft (standing / running position to ground). She landed on concrete. There was no blood loss. Point of impact: R. chest pain  Pain location: R. chest. The pain is at a severity of 5/10. The pain is moderate. The symptoms are aggravated by movement (breathing and coughing). Pertinent negatives include no abdominal pain, bowel incontinence, fever, headaches, hearing loss, hematuria, loss of consciousness, nausea, numbness, tingling, visual change or vomiting. She has tried acetaminophen for the symptoms. The treatment provided no relief.           Review of Systems   Constitutional: Negative for fever and chills.   HENT: Negative for congestion.    Gastrointestinal: Negative for nausea, vomiting, abdominal pain and bowel incontinence.   Endocrine: Negative for cold intolerance and heat intolerance.   Genitourinary: Negative for hematuria and difficulty urinating.   Musculoskeletal: Negative for myalgias, joint swelling, arthralgias and neck pain.   Skin: Negative for color change, rash and wound.   Allergic/Immunologic: Negative for immunocompromised state.   Neurological: Negative for dizziness, tingling, loss of consciousness, weakness, light-headedness, numbness and headaches.   Hematological: Does not bruise/bleed easily.   Psychiatric/Behavioral: Negative for sleep disturbance. The patient is not nervous/anxious.    All other systems reviewed and are negative.          Objective:     Physical Exam   Nursing note and vitals reviewed.  Constitutional: She is oriented to person, place, and time. She appears well-developed and well-nourished. No distress.   MORBIDLY OBESE   HENT:   Head: Normocephalic and atraumatic.   Eyes: EOM are normal. Pupils are equal, round, and reactive to light.   Neck:  Normal range of motion. Neck supple.   Cardiovascular: Normal rate, regular rhythm and normal heart sounds.  Exam reveals no gallop.    No murmur heard.  Pulmonary/Chest: Effort normal and breath sounds normal. No respiratory distress. She has no wheezes. She exhibits tenderness.   Reproducible chest tenderness     Musculoskeletal: Normal range of motion.   Neurological: She is alert and oriented to person, place, and time. No cranial nerve deficit.   CN  II - XII intact   Skin: Skin is warm and dry. No rash noted. She is not diaphoretic. No erythema. No pallor.   Psychiatric: She has a normal mood and affect. Her behavior is normal. Judgment and thought content normal.           Assessment:       Rib fractures an contusions   MORBID OBESITY      Plan:       Rest  Percocet  Deep Incentive breathing    F/u pcp    Patient to follow up with primary care doctor or GO TO E.R. if symptoms worsening or not resolving as expected.   Patient verbalized understanding.   Reviewed discharge instructions that are included here with patient, and printed in AVS. Questions answered.

## 2014-02-25 ENCOUNTER — Ambulatory Visit (INDEPENDENT_AMBULATORY_CARE_PROVIDER_SITE_OTHER): Payer: HMO | Admitting: Internal Medicine

## 2014-02-25 ENCOUNTER — Encounter (INDEPENDENT_AMBULATORY_CARE_PROVIDER_SITE_OTHER): Payer: Self-pay | Admitting: Internal Medicine

## 2014-02-25 VITALS — BP 181/87 | HR 66 | Temp 98.0°F | Wt 201.6 lb

## 2014-02-25 DIAGNOSIS — R05 Cough: Secondary | ICD-10-CM

## 2014-02-25 DIAGNOSIS — S2241XA Multiple fractures of ribs, right side, initial encounter for closed fracture: Secondary | ICD-10-CM

## 2014-02-25 DIAGNOSIS — R058 Other specified cough: Secondary | ICD-10-CM

## 2014-02-25 MED ORDER — BENZONATATE 100 MG PO CAPS
100.0000 mg | ORAL_CAPSULE | Freq: Three times a day (TID) | ORAL | Status: DC | PRN
Start: 2014-02-25 — End: 2014-05-16

## 2014-02-25 MED ORDER — DICLOFENAC SODIUM 75 MG PO TBEC
75.0000 mg | DELAYED_RELEASE_TABLET | Freq: Two times a day (BID) | ORAL | Status: AC
Start: 2014-02-25 — End: 2014-03-04

## 2014-02-25 NOTE — Patient Instructions (Signed)
Diclofenaco Sdico, Tableta oral  Qu es este medicamento?  El DICLOFENACO es un medicamento antiinflamatorio no esteroideo (AINE). Se utiliza para reducir la inflamacin y para Corporate treasurer. Este medicamento se [place] para Teacher, music, artritis reumatoide y espondilitis anquilosante.  Este medicamento puede ser utilizado para otros usos; si tiene alguna pregunta consulte con su proveedor de atencin mdica o con su farmacutico.  Qu le debo informar a mi profesional de la salud antes de tomar este medicamento?  Necesita saber si usted presenta alguno de los siguientes problemas o situaciones:   asma, especialmente asma sensible a la aspirina   ciruga de bypass coronario con injerto en las ltimas 2 semanas   consume ms de 3 bebidas alcohlicas por da   enfermedades cardiacos o problemas circulatorios, tales como insuficiencia cardiaca o edema de pierna (retencin de lquido)   alta presin sangunea   enfermedad renal   enfermedad heptica   lceras o sangrado estomacal   una reaccin alrgica o inusual al diclofenaco, a la aspirina, otros [place], a otros medicamentos, alimentos, colorantes o conservantes   si est embarazada o buscando quedar embarazada   si est amamantando a un beb  Cmo debo [employer]?  Tome este medicamento por [place] oral con comida y con un vaso lleno de agua. No triture ni mastique el medicamento. Siga las instrucciones de la etiqueta del [place]. Tome sus dosis a intervalos regulares. No tome su medicamento con una frecuencia mayor a la indicada. El uso prolongado puede aumentar el riesgo de sufrir un ataque cardiaco o un derrame cerebral.  Su farmacutico le dar una [place] del medicamento especial con cada receta y relleno. Asegrese de leer esta informacin cada vez cuidadosamente.  Hable con su pediatra para informarse acerca del uso de este medicamento en nios. Puede requerir Customer service manager.  Los pacientes de ms de 65 aos de edad  pueden presentar reacciones ms fuertes y Pension scheme manager dosis [employer].  Sobredosis: Pngase en contacto inmediatamente con un centro toxicolgico o una sala de urgencia si usted cree que haya tomado demasiado medicamento.  ATENCIN: [employer] es solo para usted. No comparta este medicamento con nadie.  Qu sucede si me olvido de una dosis?  Si olvida una dosis, tmela lo antes posible. Si es casi la hora de la prxima dosis, tome slo esa dosis. No tome dosis adicionales o dobles.  Qu puede interactuar con este medicamento?  No tome esta medicina con ninguno de los siguientes medicamentos:   cidofovir   quetorolac   metotrexato  Esta medicina tambin puede interactuar con los siguientes medicamentos:   alcohol   aspirina y medicamentos tipo aspirina   ciclosporina   diurticos   litio   medicamentos para la presin sangunea   medicamentos para osteoporosis   medicamentos que afectan las plaquetas   medicamentos que tratan o previenen cogulos sanguneos, como la warfarina   los [place], medicamentos para Chief Technology Officer o inflamacin, como ibuprofeno o naproxeno   pemetrexed   medicamentos esteroideos, como la prednisona o la cortisona  Puede ser que esta lista no menciona todas las posibles interacciones. Informe a su profesional de [employer] de [employer] productos a base de hierbas, medicamentos de [place] o suplementos nutritivos que est tomando. Si usted fuma, consume bebidas alcohlicas o si utiliza drogas ilegales, indqueselo tambin a su profesional de [employer]. Algunas sustancias pueden interactuar con su medicamento.  A qu debo estar atento al usar [employer]?  Si el dolor no  mejora, informe a su mdico o a su profesional de [employer]. Consulte con su mdico antes de tomar otros analgsicos. No se trate usted mismo.  Este medicamento no previene ataques cardiacos o derrames cerebrales. De hecho, este medicamento puede aumentar la posibilidad de Marine scientist un ataque cardiaco o un derrame  cerebral. La posibilidad puede aumentar con el uso prolongado de este medicamento y en pacientes con enfermedad cardiaca. Si est tomando aspirina para la prevencin de ataques cardiacos o derrames cerebrales, comunquese con su mdico o su profesional de [employer].  Evite tomar medicamentos, tales como ibuprofeno y naproxeno con Industrial/product designer. Es probable que se Special educational needs teacher secundarios, tales como molestias estomacales, nuseas o lceras. No debe de tomar [employer] con muchos medicamentos disponibles de [employer].  Este medicamento puede provocar lceras y hemorragia del estmago e intestinos en cualquier momento durante tratamiento. No fume ni ingiera alcohol. Esto irrita an ms el estmago y puede hacerlo ms susceptible a dao por el uso de [employer]. Pueden ocurrir lceras y hemorragia sin sntomas de Control and instrumentation engineer y Financial controller la [place].  Puede experimentar mareos o somnolencia. No conduzca ni utilice maquinaria ni haga nada que Scientist, research (life sciences) en estado de alerta hasta que sepa cmo le afecta este medicamento. No se siente ni se ponga de pie con rapidez, especialmente si es un paciente de edad avanzada. Esto reduce el riesgo de mareos o [employer].  Este medicamento puede hacerle sangrar con mayor facilidad. Trate de no lastimarse los dientes y las encas al cepillarlos o limpiarlos con hilo dental.  Qu efectos secundarios puedo tener al [employer] este medicamento?  Efectos secundarios que debe informar a su mdico o a Producer, television/film/video de la salud tan pronto como sea posible:   Therapist, art como erupcin cutnea, picazn o urticarias, hinchazn de la cara, labios o lengua   heces de color oscuro o con sangre, sangre en la orina o vmito con sangre   visin borrosa   dolor en el pecho   dificultad al respirar o sibilancias   nuseas, vmito   hablar arrastrando las palabras o debilidad en un lado del cuerpo   aumento de peso o hinchazn que no tienen  explicacin   cansancio o debilidad inusual   color amarillento de los ojos o la piel  Efectos secundarios que, por lo general, no requieren Psychologist, prison and probation services (debe informarlos a su mdico o a Producer, television/film/video de la salud si persisten o si son molestos):   estreimiento   diarrea   mareos   dolor de cabeza   Merchant navy officer  Puede ser que esta lista no menciona todos los posibles efectos secundarios. Comunquese a su mdico por asesoramiento mdico [employer]. Usted puede informar los efectos secundarios a la FDA por telfono al 1-800-FDA-1088.  Dnde debo guardar mi medicina?  Mantngala fuera del alcance de los nios.  Gurdela a [employer], a menos de 30 grados C (86 grados F). Protjala de la humedad. Mantenga el envase bien cerrado. Deseche todo el medicamento que no haya utilizado, despus de la fecha de vencimiento.   [address] [employer] [address] [place], [place] [address]. Todos los derechos reservados. Esta informacin no pretende sustituir la atencin Actor. Slo su mdico puede diagnosticar y tratar un problema de salud.

## 2014-02-25 NOTE — Progress Notes (Signed)
Subjective:       Patient ID: [name] is a 59 y.o. female.  Chief Complaint   Patient presents with   . Fall     Closed fracture of multi ribs of right side after fall on 02/18/14, went to [facility] on 02/21/2014; started having cough        HPI   Patient is a 59 year old Hispanic female with history of morbid obesity, uncontrolled diabetes mellitus 2, hypertension, hyperlipidemia who presents today for follow-up after recent fall and right-sided rib fractures.    Patient reports that on January 12.  She was walking at the [facility] station, tripped and fell on her right side, hitting her right breast and rib cage.  She did not go for medical evaluation right away but on January 15, had excruciating pain and went to the local urgent care center where she was evaluated and had x-rays of her right rib cage done which showed fracture of her 2nd and 4th ribs.  She was given a prescription for Percocet and was advised to do deep breathing exercises.  Since then, she reports her pain has decreased from an average of 10/10-8/10 in intensity.  She has not been taking Percocet regularly can she was afraid it would increase her blood pressure.  She has not had any shortness of breath or wheezing, but admits that she has been having a cough since even before the fall, which has been mostly dry but occasionally productive of yellowish sputum.  She has not had any fevers, chills, night sweats, nausea, vomiting.  Her blood pressure on her visit.  Right prior to the fall was well-controlled, however, her blood pressures at home have been in the 180 systolic and 80s to 90s diastolic since her fall.    The following portions of the patient's history were reviewed and updated as appropriate: allergies, current medications, past medical history, past social history and problem list.     Past Medical History   Diagnosis Date   . Glaucoma 06/01/2012   . Diabetes mellitus type 2 in obese    . Hypertension    . Hyperlipidemia    . Obesity     . Diabetic retinopathy 06/28/2013     Of the right eye, per patient       History     Social History   . Marital Status: Single     Spouse Name: N/A     Number of Children: 3   . Years of Education: N/A     Occupational History   . Maintenance      Social History Main Topics   . Smoking status: Never Smoker    . Smokeless tobacco: Never Used   . Alcohol Use: No   . Drug Use: No   . Sexual Activity: Not Currently     Other Topics Concern   . Not on file     Social History Narrative    Originally from [place].  Lives with her daughter.    Exercise: walks frequently       No Known Allergies    Current Outpatient Prescriptions   Medication Sig Dispense Refill   . aspirin 81 MG tablet Take 81 mg by mouth daily.       Marland Kitchen atenolol (TENORMIN) 50 MG tablet Take 1.5 tablets (75 mg total) by mouth daily. 135 tablet 0   . atorvastatin (LIPITOR) 20 MG tablet Take 20 mg by mouth daily.     . Calcium Carbonate-Vitamin D (  CALCIUM-D PO) Take 1 tablet by mouth daily.     Marland Kitchen diltiazem (CARDIZEM CD) 300 MG 24 hr capsule Take 1 capsule (300 mg total) by mouth daily. 90 capsule 0   . glucose blood (ONE TOUCH ULTRA TEST) test strip Measure blood glucose 2 times a day 100 each 1   . insulin NPH-insulin regular (HUMULIN,NOVOLIN 70/30) (70-30) 100 UNIT/ML injection Take 72 units subcutaneously in the morning and 34 units in the evening 30 mL 2   . Insulin Syringe-Needle U-100 (INSULIN SYRINGE 1CC/30GX5/16") 30G X 5/16" 1 ML Misc Use syringe for insulin injection 2 times a day 100 each 1   . latanoprost (XALATAN) 0.005 % ophthalmic solution      . lisinopril (PRINIVIL,ZESTRIL) 40 MG tablet Take 1 tablet (40 mg total) by mouth daily. 90 tablet 0   . metFORMIN (GLUCOPHAGE) 1000 MG tablet Take 1 tablet (1,000 mg total) by mouth 2 (two) times daily with meals. 180 tablet 0   . ONETOUCH DELICA LANCETS 33G Misc      . oxyCODONE-acetaminophen (PERCOCET) 5-325 MG per tablet Take 1 tablet by mouth every 6 (six) hours as needed for Pain. 20 tablet 0    . benzonatate (TESSALON PERLES) 100 MG capsule Take 1 capsule (100 mg total) by mouth 3 (three) times daily as needed. 30 capsule 0   . diclofenac (VOLTAREN) 75 MG EC tablet Take 1 tablet (75 mg total) by mouth 2 (two) times daily. 14 tablet 0     No current facility-administered medications for this visit.       Review of Systems   Respiratory: Positive for cough. Negative for chest tightness, shortness of breath and wheezing.    Cardiovascular: Positive for chest pain (on right chest wall area). Negative for palpitations.   Neurological: Negative for dizziness, weakness, light-headedness and headaches.         Objective:    Physical Exam   Constitutional: She is oriented to person, place, and time. She appears well-developed. No distress.   obese   Cardiovascular: Normal rate and regular rhythm.  Exam reveals no gallop.    Pulmonary/Chest: Effort normal. No respiratory distress. She has no wheezes. She has no rales.   No bruising on evaluation of right-sided chest wall.  There is tenderness on palpation of middle to low right-sided chest wall.   Neurological: She is alert and oriented to person, place, and time.   Skin: She is not diaphoretic.   Psychiatric: Her behavior is normal.   Vitals reviewed.    BP 181/87 mmHg  Pulse 66  Temp(Src) 98 F (36.7 C) (Oral)  Wt 91.445 kg (201 lb 9.6 oz)    Right sided rib x-rays 02/21/2014  FINDINGS:   PA view of the chest and AP and oblique views of the right-sided ribs  were obtained. Evaluation is limited by patient's body habitus. There  may be minimally displaced fractures of the posterior lateral right  second and fourth ribs. Lung volumes are low. There is mild bibasilar  atelectasis. No appreciable pleural effusion or pneumothorax.  Cardiomediastinal silhouette is within normal limits given low lung  volumes.  IMPRESSION:   Limited evaluation. Possible minimally displaced fractures  of the posterolateral right second and fourth ribs.      Assessment:       1.  Closed fracture of multiple ribs of right side, initial encounter  diclofenac (VOLTAREN) 75 MG EC tablet   2. Dry cough  benzonatate (TESSALON PERLES) 100 MG capsule  Plan:       We have discussed recent x-ray of the right ribs with patient, which was consistent with minimally displaced fractures of the posterolateral right 2nd and 4th ribs.    We did encourage her to do deep breathing several times a day to help prevent atelectasis    As she has not noticed any improvement on Percocet and is afraid to take it regularly, we will begin therapy with diclofenac 75 mg 2 times a day with food , which should help with pain and inflammation.  She is aware she may take Percocet, one tablet every 8 hours as needed for pain.  She is aware Percocet may cause constipation and is aware she may take an over-the-counter stool softener if needed.    We have given her prescription for her to take Tessalon Perles, 1 capsule every 8 hours as needed for cough suppression.  If needed, but there is currently no signs of bronchitis or pneumonia.  On her exam.    Risk & Benefits of the new medication(s) were explained to the patient (and family) who verbalized understanding & agreed to the treatment plan. Patient (family) encouraged to contact me/clinical staff with any questions/concerns    She will return to clinic if no continued improvement in her pain.  Her blood pressures over the next 7 days.  I do suspect that her blood pressures have been elevated due to pain and hopefully with diclofenac her pain will be better managed.  Procedures

## 2014-03-05 ENCOUNTER — Other Ambulatory Visit (INDEPENDENT_AMBULATORY_CARE_PROVIDER_SITE_OTHER): Payer: Self-pay | Admitting: Internal Medicine

## 2014-03-05 DIAGNOSIS — I10 Essential (primary) hypertension: Secondary | ICD-10-CM

## 2014-03-05 NOTE — Telephone Encounter (Signed)
Request was also received for Glipizide 5 MG take 1 tablet daily. This is not on pt's current medication list.  Patient was last seen on 02/25/2014 and has a future appointment scheduled with you in early April.

## 2014-03-07 NOTE — Telephone Encounter (Signed)
Rx for lisinopril was filled during last visit on 02/14/2014 for 90 days w/o refills.  Glipizide was discontinued.  Please notify pharmacy.  Thanks.

## 2014-03-07 NOTE — Telephone Encounter (Signed)
Pharmacy was notified and profile was updated.

## 2014-05-16 ENCOUNTER — Other Ambulatory Visit: Payer: HMO

## 2014-05-16 ENCOUNTER — Ambulatory Visit (INDEPENDENT_AMBULATORY_CARE_PROVIDER_SITE_OTHER): Payer: HMO | Admitting: Internal Medicine

## 2014-05-16 VITALS — BP 146/70 | HR 64 | Temp 98.0°F | Wt 199.0 lb

## 2014-05-16 DIAGNOSIS — E11319 Type 2 diabetes mellitus with unspecified diabetic retinopathy without macular edema: Secondary | ICD-10-CM

## 2014-05-16 DIAGNOSIS — E782 Mixed hyperlipidemia: Secondary | ICD-10-CM

## 2014-05-16 DIAGNOSIS — I10 Essential (primary) hypertension: Secondary | ICD-10-CM

## 2014-05-16 DIAGNOSIS — R2689 Other abnormalities of gait and mobility: Secondary | ICD-10-CM

## 2014-05-16 MED ORDER — ATENOLOL 100 MG PO TABS
100.0000 mg | ORAL_TABLET | Freq: Every day | ORAL | Status: DC
Start: 2014-05-16 — End: 2014-08-19

## 2014-05-16 MED ORDER — METFORMIN HCL 1000 MG PO TABS
1000.0000 mg | ORAL_TABLET | Freq: Two times a day (BID) | ORAL | Status: DC
Start: 2014-05-16 — End: 2014-08-19

## 2014-05-16 MED ORDER — INSULIN NPH ISOPHANE & REGULAR (70-30) 100 UNIT/ML SC SUSP
SUBCUTANEOUS | Status: DC
Start: 2014-05-16 — End: 2014-08-19

## 2014-05-16 MED ORDER — GLUCOSE BLOOD VI STRP
ORAL_STRIP | Status: DC
Start: 2014-05-16 — End: 2014-09-17

## 2014-05-16 MED ORDER — ONETOUCH DELICA LANCETS 33G MISC
Status: DC
Start: 2014-05-16 — End: 2014-09-17

## 2014-05-16 MED ORDER — DILTIAZEM HCL ER COATED BEADS 300 MG PO CP24
300.0000 mg | ORAL_CAPSULE | Freq: Every day | ORAL | Status: DC
Start: 2014-05-16 — End: 2014-08-19

## 2014-05-16 MED ORDER — LISINOPRIL 40 MG PO TABS
40.0000 mg | ORAL_TABLET | Freq: Every day | ORAL | Status: DC
Start: 2014-05-16 — End: 2014-08-19

## 2014-05-16 MED ORDER — ATORVASTATIN CALCIUM 20 MG PO TABS
20.0000 mg | ORAL_TABLET | Freq: Every day | ORAL | Status: DC
Start: 2014-05-16 — End: 2014-08-19

## 2014-05-16 MED ORDER — INSULIN SYRINGE 30G X 5/16" 1 ML MISC
Status: DC
Start: 2014-05-16 — End: 2014-09-17

## 2014-05-16 NOTE — Progress Notes (Signed)
Subjective:       Patient ID: [name] is a 59 y.o. female.  Seen in clinic on 05/16/14    Chief Complaint   Patient presents with   . Diabetes     fasting glucose in 90's-100's; bedtime glucose in 150's   . Hypertension     home BPs around 160's systolic, only checks when feeling bad   . Hyperlipidemia   . Balance problem     for the last month or so has been having problems w/ balance and feeling like she is going to fall       HPI   The patient is a 59 year old Hispanic female with history of obesity, diabetes mellitus 2, hypertension, hyperlipidemia, fatty liver among others who presents today for followup of the above.    Since her last visit she reports that for the last month or so she has been having problems with her balance and has been feeling like she is going to fall.  She denies any extremity numbness, tingling, paresthesias or weakness.  He has not been having any frequent headaches.  Denies any problems with decreased hearing, tinnitus or frank symptoms of vertigo.    Regarding her diabetes, her last hemoglobin A1c about 3 months ago was 8.1%.  She reports she has been checking her fasting glucose and states this has been in the 90s to 100s and bedtime glucose has been in the 150s.  Reports compliance w/ insulin 70/30 72 units in the morning and 34 units in the evening as well as metformin 100mg  BID.  Denies any polydipsia or polyuria.    Hypertension - patient admits she has been under a lot of stress at home and states she has been checking her blood pressures at home which have been around 160 systolic but she admits that she only checks when "she is feeling bad".  She reports compliance with atenolol 100 mg daily, diltiazem 300 mg daily and also lisinopril 40 mg daily.  Denies any frequent chest pain, palpitations, shortness of breath, lower extremity edema or dyspnea on exertion.    Regards to her hyperlipidemia, her last lipid panel was done May 2015 and showed acceptable control.   Reports compliance with atorvastatin 20 mg daily denies any frequent myalgias or abdominal pain.    The following portions of the patient's history were reviewed and updated as appropriate: allergies, current medications, past medical history, past social history and problem list.     Past Medical History   Diagnosis Date   . Glaucoma 06/01/2012   . Diabetes mellitus type 2 in obese    . Hypertension    . Hyperlipidemia    . Obesity    . Diabetic retinopathy 06/28/2013     Of the right eye, per patient       History     Social History   . Marital Status: Single     Spouse Name: N/A   . Number of Children: 3   . Years of Education: N/A     Occupational History   . Maintenance      Social History Main Topics   . Smoking status: Never Smoker    . Smokeless tobacco: Never Used   . Alcohol Use: No   . Drug Use: No   . Sexual Activity: Not Currently     Other Topics Concern   . Not on file     Social History Narrative    Originally from [place].  Lives with  her daughter.    Exercise: walks frequently       No Known Allergies    Current Outpatient Prescriptions   Medication Sig Dispense Refill   . aspirin 81 MG tablet Take 81 mg by mouth daily.       Marland Kitchen atenolol (TENORMIN) 50 MG tablet Take 1.5 tablets (75 mg total) by mouth daily. 135 tablet 0   . atorvastatin (LIPITOR) 20 MG tablet Take 20 mg by mouth daily.     . Calcium Carbonate-Vitamin D (CALCIUM-D PO) Take 1 tablet by mouth daily.     Marland Kitchen diltiazem (CARDIZEM CD) 300 MG 24 hr capsule Take 1 capsule (300 mg total) by mouth daily. 90 capsule 0   . glucose blood (ONE TOUCH ULTRA TEST) test strip Measure blood glucose 2 times a day 100 each 1   . insulin NPH-insulin regular (HUMULIN,NOVOLIN 70/30) (70-30) 100 UNIT/ML injection Take 72 units subcutaneously in the morning and 34 units in the evening 30 mL 2   . Insulin Syringe-Needle U-100 (INSULIN SYRINGE 1CC/30GX5/16") 30G X 5/16" 1 ML Misc Use syringe for insulin injection 2 times a day 100 each 1   . latanoprost (XALATAN)  0.005 % ophthalmic solution Place 1 drop into both eyes nightly.        Marland Kitchen lisinopril (PRINIVIL,ZESTRIL) 40 MG tablet Take 1 tablet (40 mg total) by mouth daily. 90 tablet 0   . metFORMIN (GLUCOPHAGE) 1000 MG tablet Take 1 tablet (1,000 mg total) by mouth 2 (two) times daily with meals. 180 tablet 0   . ONETOUCH DELICA LANCETS 33G Misc        No current facility-administered medications for this visit.       Review of Systems   Constitutional: Positive for fatigue. Unexpected weight change: occasionally.   Respiratory: Negative for chest tightness and shortness of breath.    Cardiovascular: Negative for chest pain and palpitations.   Gastrointestinal: Negative for nausea, vomiting, abdominal pain and constipation.   Endocrine: Negative for polydipsia and polyuria.   Genitourinary: Negative for dysuria, frequency and hematuria.   Neurological: Negative for dizziness, light-headedness and headaches.        Balance problem     Psychiatric/Behavioral: Negative for dysphoric mood. The patient is not nervous/anxious.            Objective:    Physical Exam   Constitutional: She is oriented to person, place, and time. She appears well-developed. No distress.   obese   HENT:   Right Ear: No tenderness. Tympanic membrane is not erythematous and not bulging.   Left Ear: No tenderness. Tympanic membrane is not erythematous and not bulging.   Nose: No mucosal edema.   Mouth/Throat: No oropharyngeal exudate.   Moist oral mucous membranes   Eyes: Conjunctivae are normal.   Cardiovascular: Normal rate and regular rhythm.  Exam reveals no gallop and no friction rub.    No murmur heard.  Pulmonary/Chest: Effort normal. No respiratory distress. She has no wheezes. She has no rales.   Abdominal: Soft. There is no tenderness.   Musculoskeletal: She exhibits no edema.   Neurological: She is alert and oriented to person, place, and time.   Skin: She is not diaphoretic.   Psychiatric: Her behavior is normal.   Vitals reviewed.    BP 146/70  mmHg  Pulse 64  Temp(Src) 98 F (36.7 C) (Oral)  Wt 90.266 kg (199 lb)    Lab Results   Component Value Date    HGBA1C 8.1* 02/15/2014  Lab Results   Component Value Date    WBC 9.94 02/17/2011    HGB 14.0 02/17/2011    HCT 40.6 02/17/2011    MCV 89.8 02/17/2011    PLT 237 02/17/2011     '  Chemistry        Component Value Date/Time    NA 139 02/15/2014 0733    K 5.0 02/15/2014 0733    CL 104 02/15/2014 0733    CL 102 10/31/2013 1127    CO2 27 02/15/2014 0733    BUN 20 02/15/2014 0733    CREAT 0.78 02/15/2014 0733    CREAT 0.73 10/31/2013 1127    CREAT 0.7 02/17/2011 0410    GLU 157* 02/15/2014 0733    GLU 83 10/31/2013 1127        Component Value Date/Time    CA 9.3 02/15/2014 0733    ALKPHOS 107 02/15/2014 0733    AST 54* 02/15/2014 0733    ALT 62* 02/15/2014 0733    BILITOTAL 0.4 02/15/2014 0733        Lab Results   Component Value Date    CHOL 157 06/28/2013    CHOL 174 03/29/2013    CHOL 170 06/06/2012     Lab Results   Component Value Date    HDL 70 06/28/2013    HDL 69 03/29/2013    HDL 47 06/06/2012     Lab Results   Component Value Date    LDL 66 06/28/2013    LDL 87 03/29/2013    LDL 104* 06/06/2012     Lab Results   Component Value Date    TRIG 103 06/28/2013    TRIG 92 03/29/2013    TRIG 95 06/06/2012     Lab Results   Component Value Date    TSH 1.17 06/28/2013    TSH 0.58 02/17/2011           Assessment:       1. Type 2 diabetes mellitus with diabetic retinopathy, macular edema presence unspecified, with unspecified retinopathy severity  Hemoglobin A1C    Comprehensive metabolic panel    Lipid panel    ONETOUCH DELICA LANCETS 33G Misc    metFORMIN (GLUCOPHAGE) 1000 MG tablet    Insulin Syringe-Needle U-100 (INSULIN SYRINGE 1CC/30GX5/16") 30G X 5/16" 1 ML Misc    insulin NPH-insulin regular (HUMULIN,NOVOLIN 70/30) (70-30) 100 UNIT/ML injection    glucose blood (ONE TOUCH ULTRA TEST) test strip   2. Essential hypertension  Comprehensive metabolic panel    lisinopril (PRINIVIL,ZESTRIL) 40 MG tablet     diltiazem (CARDIZEM CD) 300 MG 24 hr capsule    atenolol (TENORMIN) 100 MG tablet   3. Mixed hyperlipidemia  atorvastatin (LIPITOR) 20 MG tablet   4. Balance problem  CT Head WO Contrast    Ambulatory referral to Neurology            Plan:       Diabetes mellitus 2 - uncontrolled as evidenced by elevated hemoglobin A1c at 8.1% which should be under 7%.  Patient states that this is most likely secondary to increased stress and does not want to make any medication adjustments.  She will continue current metformin 1000 mg 2 times a day and also insulin 70/30 as above.  Encouraged to lose weight, monitor carbohydrate intake and exercise regularly as tolerated.  She is aware she should have an annual exam and also check her feet regularly.      Hypertension - blood pressure has been elevated,  likely secondary to increased stress.  Encouraged patient again to lose weight, follow a diet low in sodium, and also exercise regularly as tolerated.Marland Kitchen  She will continue current diltiazem 300 mg daily, atenolol 100 mg daily, and lisinopril 40 mg daily.  We will check chemistries.    Hyperlipidemia - well-controlled on last lipid panel in May 2015.  Patient is not fasting today and lipid panel was therefore not done.  For now she will continue current Lipitor 20 mg daily.  Encouraged patient to follow a heart healthy diet such as a Mediterranean diet and exercise regularly as tolerated     Balance problem - Order for head CT given and also advised patient to f/u w/ neurology (referral given)    RTC in 3 months for f/u DM2, hypertension and hyperlipidemia or sooner prn.   Procedures

## 2014-05-17 LAB — COMPREHENSIVE METABOLIC PANEL
ALT: 31 IU/L (ref 0–32)
AST (SGOT): 27 IU/L (ref 0–40)
Albumin/Globulin Ratio: 1.4 (ref 1.1–2.5)
Albumin: 4.2 g/dL (ref 3.5–5.5)
Alkaline Phosphatase: 120 IU/L — ABNORMAL HIGH (ref 39–117)
BUN / Creatinine Ratio: 22 (ref 9–23)
BUN: 14 mg/dL (ref 6–24)
Bilirubin, Total: 0.3 mg/dL (ref 0.0–1.2)
CO2: 27 mmol/L (ref 18–29)
Calcium: 9.4 mg/dL (ref 8.7–10.2)
Chloride: 100 mmol/L (ref 97–108)
Creatinine: 0.65 mg/dL (ref 0.57–1.00)
EGFR: 113 mL/min/{1.73_m2} (ref >59)
EGFR: 98 mL/min/{1.73_m2} (ref >59)
Globulin, Total: 3 g/dL (ref 1.5–4.5)
Glucose: 113 mg/dL — ABNORMAL HIGH (ref 65–99)
Potassium: 4.6 mmol/L (ref 3.5–5.2)
Protein, Total: 7.2 g/dL (ref 6.0–8.5)
Sodium: 139 mmol/L (ref 134–144)

## 2014-05-17 LAB — LIPID PANEL
Cholesterol / HDL Ratio: 1.8 ratio units (ref 0.0–4.4)
Cholesterol: 119 mg/dL (ref 100–199)
HDL: 66 mg/dL (ref >39)
LDL Calculated: 33 mg/dL (ref 0–99)
Triglycerides: 98 mg/dL (ref 0–149)
VLDL Calculated: 20 mg/dL (ref 5–40)

## 2014-05-17 LAB — HEMOGLOBIN A1C: Hemoglobin A1C: 8.3 % — ABNORMAL HIGH (ref 4.8–5.6)

## 2014-05-20 ENCOUNTER — Encounter (INDEPENDENT_AMBULATORY_CARE_PROVIDER_SITE_OTHER): Payer: Self-pay | Admitting: Internal Medicine

## 2014-07-03 ENCOUNTER — Encounter (INDEPENDENT_AMBULATORY_CARE_PROVIDER_SITE_OTHER): Payer: Self-pay | Admitting: Internal Medicine

## 2014-08-14 ENCOUNTER — Other Ambulatory Visit (INDEPENDENT_AMBULATORY_CARE_PROVIDER_SITE_OTHER): Payer: Self-pay

## 2014-08-14 DIAGNOSIS — E11319 Type 2 diabetes mellitus with unspecified diabetic retinopathy without macular edema: Secondary | ICD-10-CM

## 2014-08-14 NOTE — Telephone Encounter (Signed)
Fax received from pharmacy requesting refill for Metformin HCL 1,000 mg. Patient was last seen in office on 02/15/2014 and has a future appointment 08/19/2014.

## 2014-08-15 ENCOUNTER — Telehealth (INDEPENDENT_AMBULATORY_CARE_PROVIDER_SITE_OTHER): Payer: Self-pay | Admitting: Internal Medicine

## 2014-08-15 NOTE — Telephone Encounter (Signed)
Is the patient taking current medications? Yes   Has there been any changes to current medication list? No     No any barriers/adverse reactions to current medications.      Has the patient sought any care outside of the [facility]? No

## 2014-08-19 ENCOUNTER — Encounter (INDEPENDENT_AMBULATORY_CARE_PROVIDER_SITE_OTHER): Payer: Self-pay | Admitting: Internal Medicine

## 2014-08-19 ENCOUNTER — Ambulatory Visit (INDEPENDENT_AMBULATORY_CARE_PROVIDER_SITE_OTHER): Payer: Self-pay | Admitting: Internal Medicine

## 2014-08-19 VITALS — BP 133/72 | HR 63 | Temp 98.2°F | Resp 20 | Wt 196.3 lb

## 2014-08-19 DIAGNOSIS — E782 Mixed hyperlipidemia: Secondary | ICD-10-CM

## 2014-08-19 DIAGNOSIS — E11319 Type 2 diabetes mellitus with unspecified diabetic retinopathy without macular edema: Secondary | ICD-10-CM

## 2014-08-19 DIAGNOSIS — I10 Essential (primary) hypertension: Secondary | ICD-10-CM

## 2014-08-19 MED ORDER — METFORMIN HCL 1000 MG PO TABS
1000.0000 mg | ORAL_TABLET | Freq: Two times a day (BID) | ORAL | Status: DC
Start: 2014-08-19 — End: 2014-09-17

## 2014-08-19 MED ORDER — ATENOLOL 100 MG PO TABS
100.0000 mg | ORAL_TABLET | Freq: Every day | ORAL | Status: DC
Start: 2014-08-19 — End: 2014-09-17

## 2014-08-19 MED ORDER — INSULIN NPH ISOPHANE & REGULAR (70-30) 100 UNIT/ML SC SUSP
SUBCUTANEOUS | Status: DC
Start: 2014-08-19 — End: 2014-09-17

## 2014-08-19 MED ORDER — DILTIAZEM HCL ER COATED BEADS 300 MG PO CP24
300.0000 mg | ORAL_CAPSULE | Freq: Every day | ORAL | Status: DC
Start: 2014-08-19 — End: 2014-09-17

## 2014-08-19 MED ORDER — LISINOPRIL 40 MG PO TABS
40.0000 mg | ORAL_TABLET | Freq: Every day | ORAL | Status: DC
Start: 2014-08-19 — End: 2014-09-17

## 2014-08-19 MED ORDER — ATORVASTATIN CALCIUM 20 MG PO TABS
20.0000 mg | ORAL_TABLET | Freq: Every day | ORAL | Status: DC
Start: 2014-08-19 — End: 2014-09-17

## 2014-08-19 NOTE — Progress Notes (Signed)
Has the patient sought any care outside of the [facility]? No

## 2014-08-19 NOTE — Progress Notes (Signed)
Subjective:       Patient ID: [name] is a 59 y.o. female.  Chief Complaint   Patient presents with   . Follow-up     3 month     Patient came in today, initially for an annual wellness visit and then changed to follow-up visit.  As she has lost her insurance.  She reports she cannot afford her medication and also cannot pay for.  Labs ordered today and therefore states she does not want to have labs done.    Visit was initially pay for out of pocket.  The patient then stated she would prefer to cancel her visit.  Patient notified that she should receive reimbursement in her credit card.      I temporarily refilled her current medications and advised her that, if she cannot get labs done or return for reevaluation.  Over the next 2-3 months, then I will not be able to refill her medications.    Also advised her on the possibility of checking to see if she qualifies for visits at [facility] where she was being seen prior to coming to this office.  Patient will look into this as well.    HPI    The following portions of the patient's history were reviewed and updated as appropriate: allergies, current medications, past family history, past medical history, past social history, past surgical history and problem list.     Review of Systems        Objective:    Physical Exam    Procedures

## 2014-09-16 ENCOUNTER — Ambulatory Visit (INDEPENDENT_AMBULATORY_CARE_PROVIDER_SITE_OTHER): Payer: Self-pay | Admitting: Internal Medicine

## 2014-09-17 ENCOUNTER — Ambulatory Visit (INDEPENDENT_AMBULATORY_CARE_PROVIDER_SITE_OTHER): Payer: BLUE CROSS/BLUE SHIELD | Admitting: Internal Medicine

## 2014-09-17 ENCOUNTER — Encounter (INDEPENDENT_AMBULATORY_CARE_PROVIDER_SITE_OTHER): Payer: Self-pay | Admitting: Internal Medicine

## 2014-09-17 VITALS — BP 121/72 | HR 54 | Temp 98.4°F | Ht 65.0 in | Wt 190.0 lb

## 2014-09-17 DIAGNOSIS — I872 Venous insufficiency (chronic) (peripheral): Secondary | ICD-10-CM

## 2014-09-17 DIAGNOSIS — M25561 Pain in right knee: Secondary | ICD-10-CM

## 2014-09-17 DIAGNOSIS — G8929 Other chronic pain: Secondary | ICD-10-CM

## 2014-09-17 DIAGNOSIS — E11319 Type 2 diabetes mellitus with unspecified diabetic retinopathy without macular edema: Secondary | ICD-10-CM

## 2014-09-17 DIAGNOSIS — E782 Mixed hyperlipidemia: Secondary | ICD-10-CM

## 2014-09-17 DIAGNOSIS — I10 Essential (primary) hypertension: Secondary | ICD-10-CM

## 2014-09-17 HISTORY — DX: Venous insufficiency (chronic) (peripheral): I87.2

## 2014-09-17 LAB — COMPREHENSIVE METABOLIC PANEL
ALT: 35 U/L (ref 0–55)
AST (SGOT): 33 U/L (ref 5–34)
Albumin/Globulin Ratio: 1 (ref 0.9–2.2)
Albumin: 3.6 g/dL (ref 3.5–5.0)
Alkaline Phosphatase: 118 U/L — ABNORMAL HIGH (ref 37–106)
BUN: 16 mg/dL (ref 7.0–19.0)
Bilirubin, Total: 0.5 mg/dL (ref 0.1–1.2)
CO2: 28 mEq/L (ref 21–30)
Calcium: 9.6 mg/dL (ref 8.5–10.5)
Chloride: 103 mEq/L (ref 100–111)
Creatinine: 0.8 mg/dL (ref 0.4–1.5)
Globulin: 3.5 g/dL (ref 2.0–3.7)
Glucose: 240 mg/dL — ABNORMAL HIGH (ref 70–100)
Potassium: 4.8 mEq/L (ref 3.5–5.3)
Protein, Total: 7.1 g/dL (ref 6.0–8.3)
Sodium: 139 mEq/L (ref 135–146)

## 2014-09-17 LAB — GFR: EGFR: >60

## 2014-09-17 LAB — HEMOGLOBIN A1C: Hemoglobin A1C: 9.9 % — ABNORMAL HIGH (ref 4.6–5.9)

## 2014-09-17 LAB — HEMOLYSIS INDEX: Hemolysis Index: 3 (ref 0–18)

## 2014-09-17 MED ORDER — ONETOUCH DELICA LANCETS 33G MISC
Status: DC
Start: 2014-09-17 — End: 2015-09-04

## 2014-09-17 MED ORDER — ATENOLOL 100 MG PO TABS
100.0000 mg | ORAL_TABLET | Freq: Every day | ORAL | Status: DC
Start: 2014-09-17 — End: 2014-12-18

## 2014-09-17 MED ORDER — LISINOPRIL 40 MG PO TABS
40.0000 mg | ORAL_TABLET | Freq: Every day | ORAL | Status: DC
Start: 2014-09-17 — End: 2014-12-18

## 2014-09-17 MED ORDER — METFORMIN HCL 1000 MG PO TABS
1000.0000 mg | ORAL_TABLET | Freq: Two times a day (BID) | ORAL | Status: DC
Start: 2014-09-17 — End: 2014-12-18

## 2014-09-17 MED ORDER — ATORVASTATIN CALCIUM 20 MG PO TABS
20.0000 mg | ORAL_TABLET | Freq: Every day | ORAL | Status: DC
Start: 2014-09-17 — End: 2014-12-18

## 2014-09-17 MED ORDER — GLUCOSE BLOOD VI STRP
ORAL_STRIP | Status: DC
Start: 2014-09-17 — End: 2014-12-18

## 2014-09-17 MED ORDER — INSULIN SYRINGE 30G X 5/16" 1 ML MISC
Status: DC
Start: 2014-09-17 — End: 2014-12-18

## 2014-09-17 MED ORDER — INSULIN NPH ISOPHANE & REGULAR (70-30) 100 UNIT/ML SC SUSP
SUBCUTANEOUS | Status: DC
Start: 2014-09-17 — End: 2014-11-11

## 2014-09-17 MED ORDER — DILTIAZEM HCL ER COATED BEADS 300 MG PO CP24
300.0000 mg | ORAL_CAPSULE | Freq: Every day | ORAL | Status: DC
Start: 2014-09-17 — End: 2014-12-18

## 2014-09-17 NOTE — Progress Notes (Signed)
Subjective:       Patient ID: [name] is a 59 y.o. female.  Chief Complaint   Patient presents with   . Diabetes     checks glucose prior to bedtime reported to be around 180's postprandial (3 hours after meal)   . Hypertension   . Hyperlipidemia       HPI  Patient is a 59 year old Hispanic female with history of obesity, diabetes mellitus 2 with history of diabetic retinopathy, hypertension, hyperlipidemia, among others who presents today for follow-up of the above.    Since her last visit, she reports she now has health insurance again, which started earlier this month.    Regarding her diabetes, her last hemoglobin A1c in April was 8.3 percent.  She reports compliance with Humulin insulin 72 units in the morning and 34 units in the evening.  Also reports compliance with metformin 1000 mg 2 times a day.  Denies any polydipsia, polyuria, extremity numbness or tingling.  She does have a history of diabetic retinopathy and is due for an appointment with Dr. Anda Kraft, which she states she will be scheduling that she has her insurance again.  She reports she only checks her blood glucose once a day as she states that in the morning.  She cannot do it as she is rushing to work.  She reports her glucose around bedtime is around 180s, usually about 3 hours after dinner.  He does not bring in her glucometer or log of her readings today.     in regards to her hypertension, she reports compliance with atenolol 100 mg daily, diltiazem 300 mg daily and lisinopril 40 mg daily.  Denies any frequent headaches, dizziness or lightheadedness, chest pain, palpitations, shortness of breath, lower extremity edema, or dyspnea on exertion.  She does have a history of mitral regurgitation and is followed by cardiologist Dr. Fransisco Beau for this.      In regards to her hyperlipidemia, her last lipid panel done in April showed acceptable control.  Reports compliance with Lipitor 20 mg daily and denies any frequent myalgias or abdominal  pain.      Since her last visit in July, she has lost about 6 pounds and reports that she has been walking much more regularly.      .  She reports that for the last 2 months or so she has been having worsening right knee pain.  Denies any recent injuries.  Denies any redness, swelling, warmth or bruising of the knee.  Has not been taking anything for the pain.  Admits chronic intermittent knee pain.  Has not had any x-rays of the knees recently.      The following portions of the patient's history were reviewed and updated as appropriate: allergies, current medications, past medical history, past social history and problem list.    Past Medical History   Diagnosis Date   . Glaucoma 06/01/2012   . Diabetes mellitus type 2 in obese    . Hypertension    . Hyperlipidemia    . Obesity    . Diabetic retinopathy 06/28/2013     Of the right eye, per patient       Social History     Social History   . Marital Status: Single     Spouse Name: N/A   . Number of Children: 3   . Years of Education: N/A     Occupational History   . Maintenance      Social History Main Topics   .  Smoking status: Never Smoker    . Smokeless tobacco: Never Used   . Alcohol Use: No   . Drug Use: No   . Sexual Activity: Not Currently     Other Topics Concern   . Not on file     Social History Narrative    Originally from [place].  Lives with her daughter.    Exercise: walks frequently       No Known Allergies    Current Outpatient Prescriptions   Medication Sig Dispense Refill   . aspirin 81 MG tablet Take 81 mg by mouth daily.       Marland Kitchen atenolol (TENORMIN) 100 MG tablet Take 1 tablet (100 mg total) by mouth daily. 90 tablet 1   . atorvastatin (LIPITOR) 20 MG tablet Take 1 tablet (20 mg total) by mouth daily. 90 tablet 1   . Calcium Carbonate-Vitamin D (CALCIUM-D PO) Take 1 tablet by mouth daily.     Marland Kitchen diltiazem (CARDIZEM CD) 300 MG 24 hr capsule Take 1 capsule (300 mg total) by mouth daily. 90 capsule 1   . glucose blood (ONE TOUCH ULTRA TEST) test  strip Measure blood glucose 2 times a day 100 each 1   . insulin NPH-insulin regular (HUMULIN,NOVOLIN 70/30) (70-30) 100 UNIT/ML injection Take 72 units subcutaneously in the morning and 34 units in the evening 30 mL 2   . Insulin Syringe-Needle U-100 (INSULIN SYRINGE 1CC/30GX5/16") 30G X 5/16" 1 ML Misc Use syringe for insulin injection 2 times a day 100 each 1   . latanoprost (XALATAN) 0.005 % ophthalmic solution Place 1 drop into both eyes nightly.        Marland Kitchen lisinopril (PRINIVIL,ZESTRIL) 40 MG tablet Take 1 tablet (40 mg total) by mouth daily. 90 tablet 1   . metFORMIN (GLUCOPHAGE) 1000 MG tablet Take 1 tablet (1,000 mg total) by mouth 2 (two) times daily with meals. 180 tablet 1   . ONETOUCH DELICA LANCETS 33G Misc Check glucose 1-2 times a day 100 each 1   . [DISCONTINUED] atenolol (TENORMIN) 100 MG tablet Take 1 tablet (100 mg total) by mouth daily. 90 tablet 0   . [DISCONTINUED] diltiazem (CARDIZEM CD) 300 MG 24 hr capsule Take 1 capsule (300 mg total) by mouth daily. 90 capsule 0   . [DISCONTINUED] glucose blood (ONE TOUCH ULTRA TEST) test strip Measure blood glucose 2 times a day 100 each 1   . [DISCONTINUED] insulin NPH-insulin regular (HUMULIN,NOVOLIN 70/30) (70-30) 100 UNIT/ML injection Take 72 units subcutaneously in the morning and 34 units in the evening 30 mL 2   . [DISCONTINUED] Insulin Syringe-Needle U-100 (INSULIN SYRINGE 1CC/30GX5/16") 30G X 5/16" 1 ML Misc Use syringe for insulin injection 2 times a day 100 each 1   . [DISCONTINUED] lisinopril (PRINIVIL,ZESTRIL) 40 MG tablet Take 1 tablet (40 mg total) by mouth daily. 90 tablet 0   . [DISCONTINUED] metFORMIN (GLUCOPHAGE) 1000 MG tablet Take 1 tablet (1,000 mg total) by mouth 2 (two) times daily with meals. 180 tablet 0   . [DISCONTINUED] ONETOUCH DELICA LANCETS 33G Misc Check glucose 1-2 times a day 100 each 1     No current facility-administered medications for this visit.       Review of Systems   Constitutional: Negative for fatigue.    Respiratory: Negative for chest tightness and shortness of breath.    Cardiovascular: Negative for chest pain and palpitations.   Gastrointestinal: Negative for nausea, vomiting, abdominal pain, diarrhea and constipation.   Endocrine: Negative for polydipsia  and polyuria.   Genitourinary: Negative for dysuria, frequency and hematuria.   Neurological: Negative for dizziness, light-headedness and headaches.   Psychiatric/Behavioral: Negative for dysphoric mood. The patient is not nervous/anxious.          Objective:    Physical Exam   Constitutional: She is oriented to person, place, and time. She appears well-developed. No distress.   Obese   HENT:   Moist oral mucous membranes   Eyes: Conjunctivae are normal.   Wearing glasses   Cardiovascular: Normal rate and regular rhythm.  Exam reveals no gallop and no friction rub.    Murmur (2./6 systolic murmur heard best at left sternal border.) heard.  Several vein varicosities seen throughout bilateral lower extremities   Pulmonary/Chest: Effort normal. No respiratory distress. She has no wheezes. She has no rales.   Abdominal: Soft. There is no tenderness.   Musculoskeletal: She exhibits no edema.   No redness, swelling, bruising, or warmth on evaluation at the right knee.  Anterior and posterior drawer signs are negative.  No reproducible tenderness on palpation of right knee throughout   Neurological: She is alert and oriented to person, place, and time.   Monofilament testing is intact on bilateral feet   Skin: She is not diaphoretic.   There is evidence of onychomycosis on several toenails throughout bilateral feet.  No skin breakdown seen on palpation of bilateral feet.   Psychiatric: Her behavior is normal.   Vitals reviewed.    BP 121/72 mmHg  Pulse 54  Temp(Src) 98.4 F (36.9 C) (Oral)  Ht 1.651 m (5\' 5" )  Wt 86.183 kg (190 lb)  BMI 31.62 kg/m2    Lab Results   Component Value Date    HGBA1C 8.3* 05/16/2014     Lab Results   Component Value Date    WBC  9.94 02/17/2011    HGB 14.0 02/17/2011    HCT 40.6 02/17/2011    MCV 89.8 02/17/2011    PLT 237 02/17/2011     '  Chemistry        Component Value Date/Time    NA 139 05/16/2014 0000    K 4.6 05/16/2014 0000    CL 100 05/16/2014 0000    CL 104 02/15/2014 0733    CO2 27 05/16/2014 0000    BUN 14 05/16/2014 0000    CREAT 0.65 05/16/2014 0000    CREAT 0.78 02/15/2014 0733    CREAT 0.7 02/17/2011 0410    GLU 113* 05/16/2014 0000    GLU 157* 02/15/2014 0733        Component Value Date/Time    CA 9.4 05/16/2014 0000    ALKPHOS 120* 05/16/2014 0000    AST 27 05/16/2014 0000    ALT 31 05/16/2014 0000    BILITOTAL 0.3 05/16/2014 0000        Lab Results   Component Value Date    CHOL 119 05/16/2014    CHOL 157 06/28/2013    CHOL 174 03/29/2013     Lab Results   Component Value Date    HDL 66 05/16/2014    HDL 70 06/28/2013    HDL 69 03/29/2013     Lab Results   Component Value Date    LDL 33 05/16/2014    LDL 66 06/28/2013    LDL 87 03/29/2013     Lab Results   Component Value Date    TRIG 98 05/16/2014    TRIG 103 06/28/2013    TRIG 92 03/29/2013  Lab Results   Component Value Date    TSH 1.17 06/28/2013    TSH 0.58 02/17/2011           Assessment:       1. Type 2 diabetes mellitus with diabetic retinopathy, macular edema presence unspecified, with unspecified retinopathy severity   uncontrolled as evidenced by last hemoglobin A1c done in April.  Encouraged patient to continue with efforts for weight loss, monitor her carbohydrate intake and also exercise regularly as tolerated.  For now, she will continue current metformin 1000 mg 2 times a day and current Humulin insulin 74 units in the morning and 32 units in the evening.  We have encouraged her to check her blood glucose twice a day, with goal fasting blood glucose of under 120.  We will check a hemoglobin A1c and chemistries.  We will plan on checking urine micral.  Abdomen during her next visit as she is not due until September.  We have refilled her medications as  below.  ONETOUCH DELICA LANCETS 33G Misc    metFORMIN (GLUCOPHAGE) 1000 MG tablet    Insulin Syringe-Needle U-100 (INSULIN SYRINGE 1CC/30GX5/16") 30G X 5/16" 1 ML Misc    insulin NPH-insulin regular (HUMULIN,NOVOLIN 70/30) (70-30) 100 UNIT/ML injection    glucose blood (ONE TOUCH ULTRA TEST) test strip    Comprehensive metabolic panel    Hemoglobin A1C   2. Essential hypertension   well-controlled with blood pressure at goal of under 130/80 mmHg.  Encouraged continued routine exercise and continue current atenolol 100 mg daily, diltiazem 300 mg daily and also lisinopril 40 mg daily.    lisinopril (PRINIVIL,ZESTRIL) 40 MG tablet    diltiazem (CARDIZEM CD) 300 MG 24 hr capsule    atenolol (TENORMIN) 100 MG tablet    Comprehensive metabolic panel   3. Mixed hyperlipidemia   controlled on last lipid panel done in April.  Continue current Lipitor 20 mg daily.  Encouraged patient to follow a heart healthy diet such as a Mediterranean diet.    atorvastatin (LIPITOR) 20 MG tablet    Comprehensive metabolic panel   4. Chronic knee pain, right  Suspect secondary to osteoarthritis.  We have given her an order for an x-ray of the right knee.  May take Tylenol 500 mg every 6-8 hours as needed for pain.  Consider referral to orthopedics.    XR Knee 1 Or 2 Views Right            Plan:       Plan is as above.     We have encouraged her to schedule an appointment with her ophthalmologist.  Given her history of diabetic retinopathy and glaucoma.      We will follow-up with her cardiologist as scheduled for her history of mitral regurgitation.      He will return to clinic in 3 months for an annual wellness exam or sooner as needed.    Procedures

## 2014-11-11 ENCOUNTER — Other Ambulatory Visit (INDEPENDENT_AMBULATORY_CARE_PROVIDER_SITE_OTHER): Payer: Self-pay

## 2014-11-11 DIAGNOSIS — IMO0002 Reserved for concepts with insufficient information to code with codable children: Secondary | ICD-10-CM

## 2014-11-13 MED ORDER — INSULIN NPH ISOPHANE & REGULAR (70-30) 100 UNIT/ML SC SUSP
SUBCUTANEOUS | Status: DC
Start: 2014-11-13 — End: 2014-12-18

## 2014-11-24 ENCOUNTER — Encounter (INDEPENDENT_AMBULATORY_CARE_PROVIDER_SITE_OTHER): Payer: Self-pay | Admitting: Internal Medicine

## 2014-12-18 ENCOUNTER — Ambulatory Visit (INDEPENDENT_AMBULATORY_CARE_PROVIDER_SITE_OTHER): Payer: BLUE CROSS/BLUE SHIELD | Admitting: Internal Medicine

## 2014-12-18 ENCOUNTER — Encounter (INDEPENDENT_AMBULATORY_CARE_PROVIDER_SITE_OTHER): Payer: Self-pay | Admitting: Internal Medicine

## 2014-12-18 ENCOUNTER — Other Ambulatory Visit: Payer: HMO

## 2014-12-18 VITALS — BP 138/80 | HR 64 | Temp 97.6°F | Resp 22 | Ht 65.0 in | Wt 192.6 lb

## 2014-12-18 DIAGNOSIS — E118 Type 2 diabetes mellitus with unspecified complications: Secondary | ICD-10-CM

## 2014-12-18 DIAGNOSIS — Z794 Long term (current) use of insulin: Secondary | ICD-10-CM

## 2014-12-18 DIAGNOSIS — Z23 Encounter for immunization: Secondary | ICD-10-CM

## 2014-12-18 DIAGNOSIS — Z124 Encounter for screening for malignant neoplasm of cervix: Secondary | ICD-10-CM

## 2014-12-18 DIAGNOSIS — E1165 Type 2 diabetes mellitus with hyperglycemia: Secondary | ICD-10-CM

## 2014-12-18 DIAGNOSIS — E782 Mixed hyperlipidemia: Secondary | ICD-10-CM

## 2014-12-18 DIAGNOSIS — IMO0002 Reserved for concepts with insufficient information to code with codable children: Secondary | ICD-10-CM

## 2014-12-18 DIAGNOSIS — I10 Essential (primary) hypertension: Secondary | ICD-10-CM

## 2014-12-18 DIAGNOSIS — Z1239 Encounter for other screening for malignant neoplasm of breast: Secondary | ICD-10-CM

## 2014-12-18 DIAGNOSIS — Z0000 Encounter for general adult medical examination without abnormal findings: Secondary | ICD-10-CM

## 2014-12-18 DIAGNOSIS — N814 Uterovaginal prolapse, unspecified: Secondary | ICD-10-CM

## 2014-12-18 MED ORDER — ATORVASTATIN CALCIUM 20 MG PO TABS
20.0000 mg | ORAL_TABLET | Freq: Every day | ORAL | Status: DC
Start: 2014-12-18 — End: 2015-03-20

## 2014-12-18 MED ORDER — GLUCOSE BLOOD VI STRP
ORAL_STRIP | Status: DC
Start: 2014-12-18 — End: 2015-09-04

## 2014-12-18 MED ORDER — METFORMIN HCL 1000 MG PO TABS
1000.0000 mg | ORAL_TABLET | Freq: Two times a day (BID) | ORAL | Status: DC
Start: 2014-12-18 — End: 2015-03-20

## 2014-12-18 MED ORDER — LISINOPRIL 40 MG PO TABS
40.0000 mg | ORAL_TABLET | Freq: Every day | ORAL | Status: DC
Start: 2014-12-18 — End: 2015-03-20

## 2014-12-18 MED ORDER — INSULIN NPH ISOPHANE & REGULAR (70-30) 100 UNIT/ML SC SUSP
SUBCUTANEOUS | Status: DC
Start: 2014-12-18 — End: 2015-03-11

## 2014-12-18 MED ORDER — ATENOLOL 100 MG PO TABS
100.0000 mg | ORAL_TABLET | Freq: Every day | ORAL | Status: DC
Start: 2014-12-18 — End: 2015-03-20

## 2014-12-18 MED ORDER — INSULIN SYRINGE 30G X 5/16" 1 ML MISC
Status: DC
Start: 2014-12-18 — End: 2015-06-22

## 2014-12-18 MED ORDER — DILTIAZEM HCL ER COATED BEADS 300 MG PO CP24
300.0000 mg | ORAL_CAPSULE | Freq: Every day | ORAL | Status: DC
Start: 2014-12-18 — End: 2015-03-20

## 2014-12-18 NOTE — Addendum Note (Signed)
Addended by: Kennon Rounds C on: 12/18/2014 12:12 PM     Modules accepted: Orders

## 2014-12-18 NOTE — Progress Notes (Signed)
Subjective:       Patient ID: GUY TONEY is a 59 y.o. female.  Chief Complaint   Patient presents with   . Annual Exam   . Diabetes     does not bring in meter; reports she has been checking glucose in the morning, usually 90's-100's, in the afternoon in high 100's   . Hypertension     home BPs around 140's systolic       HPI  Patient is a 59 year old Hispanic female with history of diabetes mellitus, hypertension, edema, glaucoma, fatty liver her mother's who presents today for annual wellness exam. Patient does not have any complaints today.    Diabetes mellitus 2 - her last hemoglobin A1c was 9.9% in August 2016. She reports compliance with Humulin 70/30 72 units in the morning and 32 units in the evening. She checks her blood sugars 2 times a day, does not bring in log but states glucose is as above.  Reports she walks regularly.  States that she monitors carbohydrate intake but has not lost any weight since her last visit.  Question compliance..  She does have a history of retinopathy and has been seen over the last year by her specialists.      Hypertension - reports compliance with atenolol 100mg  daily, diltiazem 300 mg daily, lisinopril 40 mg daily.  Reports her blood pressures at home are usually in the 130's-140's systolic.  Denies any frequent headaches, no visual changes, chest pain, palpitations, shortness of breath, or increased lower extremity edema.     Hyperlipidemia - panel done in April 2016 showed good control. She reports compliance with atorvastatin 20 mg daily and denies any frequent myalgias or abdominal pain.    Woman's History:  No LMP recorded. Patient is postmenopausal.   Patient reports menopause was around age 64  G3P3    Health maintenance:  Breast cancer screening - last mammogram was done 07/22/2013  Cervical cancer screening - last pap smear was done 2013, normal per patient  Colon cancer screening - declines colonoscopy  Pneumovax - thinks she had 3 years ago  Tdap - Tdap  07/29/2013  Influenza vaccine - wants today    The following portions of the patient's history were reviewed and updated as appropriate: allergies, current medications, past family history, past medical history, past social history, past surgical history and problem list.     Past Medical History   Diagnosis Date   . Glaucoma 06/01/2012   . Diabetes mellitus type 2 in obese    . Hypertension    . Hyperlipidemia    . Obesity    . Diabetic retinopathy 06/28/2013     Of the right eye, per patient   . Venous insufficiency 09/17/2014     Followed at [facility].  Wears compression stockings.        Past Surgical History   Procedure Laterality Date   . Breast cyst excision  1993     bilateral       Social History     Social History   . Marital Status: Single     Spouse Name: N/A   . Number of Children: 3   . Years of Education: N/A     Occupational History   . Maintenance      Social History Main Topics   . Smoking status: Never Smoker    . Smokeless tobacco: Never Used   . Alcohol Use: No   . Drug Use: No   .  Sexual Activity: Not Currently     Other Topics Concern   . Not on file     Social History Narrative    Originally from [place].  Lives with her daughter.    Exercise: walks frequently       Family History   Problem Relation Age of Onset   . Heart disease Mother    . Diabetes Mother    . Hypertension Mother    . Hyperlipidemia Mother    . Heart disease Father    . Diabetes Father    . Hypertension Father    . Hyperlipidemia Father    . Cancer Maternal Aunt      metastatic abdomen   . Cancer Maternal Uncle      liver       No Known Allergies    Current Outpatient Prescriptions   Medication Sig Dispense Refill   . aspirin 81 MG tablet Take 81 mg by mouth daily.       Marland Kitchen atenolol (TENORMIN) 100 MG tablet Take 1 tablet (100 mg total) by mouth daily. 90 tablet 1   . atorvastatin (LIPITOR) 20 MG tablet Take 1 tablet (20 mg total) by mouth daily. 90 tablet 1   . Calcium Carbonate-Vitamin D (CALCIUM-D PO) Take  1 tablet by mouth daily.     Marland Kitchen diltiazem (CARDIZEM CD) 300 MG 24 hr capsule Take 1 capsule (300 mg total) by mouth daily. 90 capsule 1   . glucose blood (ONE TOUCH ULTRA TEST) test strip Measure blood glucose 2 times a day 100 each 1   . insulin NPH-insulin regular (HUMULIN,NOVOLIN 70/30) (70-30) 100 UNIT/ML injection Take 72 units subcutaneously in the morning and 34 units in the evening 30 mL 2   . Insulin Syringe-Needle U-100 (INSULIN SYRINGE 1CC/30GX5/16") 30G X 5/16" 1 ML Misc Use syringe for insulin injection 2 times a day 100 each 1   . latanoprost (XALATAN) 0.005 % ophthalmic solution Place 1 drop into both eyes nightly.        Marland Kitchen lisinopril (PRINIVIL,ZESTRIL) 40 MG tablet Take 1 tablet (40 mg total) by mouth daily. 90 tablet 1   . metFORMIN (GLUCOPHAGE) 1000 MG tablet Take 1 tablet (1,000 mg total) by mouth 2 (two) times daily with meals. 180 tablet 1   . ONETOUCH DELICA LANCETS 33G Misc Check glucose 1-2 times a day 100 each 1     No current facility-administered medications for this visit.         Review of Systems   Constitutional: Positive for fatigue. Negative for fever, chills and unexpected weight change.   HENT: Negative for hearing loss.    Eyes: Negative for visual disturbance.   Respiratory: Negative for cough, chest tightness and shortness of breath.    Cardiovascular: Negative for chest pain, palpitations and leg swelling.   Gastrointestinal: Negative for nausea, vomiting, abdominal pain, diarrhea, constipation and blood in stool.   Genitourinary: Negative for dysuria, frequency, hematuria, difficulty urinating and vaginal pain.   Musculoskeletal: Negative for myalgias and arthralgias.        +leg cramps   Skin: Negative for rash.   Neurological: Negative for dizziness, weakness, light-headedness and headaches.        Some difficulty w/ balance at times   Hematological: Does not bruise/bleed easily.   Psychiatric/Behavioral: Negative for dysphoric mood. The patient is not nervous/anxious.                   Objective:    Physical Exam  Constitutional: She is oriented to person, place, and time. She appears well-developed. No distress.   obese   HENT:   Head: Normocephalic.   Right Ear: Tympanic membrane, external ear and ear canal normal.   Left Ear: Tympanic membrane, external ear and ear canal normal.   Nose: No mucosal edema or rhinorrhea.   Mouth/Throat: Oropharynx is clear and moist. No oropharyngeal exudate.   Eyes: Conjunctivae are normal. Pupils are equal, round, and reactive to light. Right eye exhibits no discharge. Left eye exhibits no discharge. No scleral icterus.   Wearing glasses   Neck: Neck supple. No tracheal deviation present. No thyromegaly present.   Cardiovascular: Normal rate, regular rhythm and intact distal pulses.  Exam reveals no gallop and no friction rub.    Murmur (2./6 systolic murmur heard best at RUSB) heard.  Pulmonary/Chest: Effort normal and breath sounds normal. No respiratory distress. She has no wheezes. She has no rales. Right breast exhibits no inverted nipple, no mass, no nipple discharge, no skin change and no tenderness. Left breast exhibits no inverted nipple, no mass, no nipple discharge, no skin change and no tenderness.       Abdominal: Soft. She exhibits no mass. There is no tenderness. There is no rebound and no guarding. Hernia confirmed negative in the right inguinal area and confirmed negative in the left inguinal area.   No hepatomegaly or splenomegaly appreciated   Genitourinary: There is no rash, tenderness, lesion or injury on the right labia. There is no rash, tenderness, lesion or injury on the left labia. Uterus is not enlarged and not tender. Cervix exhibits no discharge. Right adnexum displays no tenderness. Left adnexum displays no tenderness. No tenderness or bleeding in the vagina. No vaginal discharge found.   +prolapse   Musculoskeletal: She exhibits no edema.   Wearing bilateral lower extremity compression stockings   Lymphadenopathy:      She has no cervical adenopathy.        Right: No inguinal adenopathy present.        Left: No inguinal adenopathy present.   Neurological: She is alert and oriented to person, place, and time. She has normal strength. No cranial nerve deficit. Gait normal.   Reflex Scores:       Bicep reflexes are 2+ on the right side and 2+ on the left side.       Patellar reflexes are 2+ on the right side and 2+ on the left side.  Skin: Skin is warm and dry. She is not diaphoretic.   No skin breakdown was seen in evaluation of bilateral feet   Psychiatric: She has a normal mood and affect. Her behavior is normal.   Vitals reviewed.    There were no vitals taken for this visit.     Lab Results   Component Value Date    HGBA1C 9.9* 09/17/2014     Lab Results   Component Value Date    WBC 9.94 02/17/2011    HGB 14.0 02/17/2011    HCT 40.6 02/17/2011    MCV 89.8 02/17/2011    PLT 237 02/17/2011     '  Chemistry        Component Value Date/Time    NA 139 09/17/2014 1212    K 4.8 09/17/2014 1212    CL 103 09/17/2014 1212    CL 104 02/15/2014 0733    CO2 28 09/17/2014 1212    BUN 16.0 09/17/2014 1212    CREAT 0.8 09/17/2014 1212  CREAT 0.65 05/16/2014 0000    CREAT 0.78 02/15/2014 0733    GLU 240* 09/17/2014 1212    GLU 113* 05/16/2014 0000        Component Value Date/Time    CA 9.6 09/17/2014 1212    ALKPHOS 118* 09/17/2014 1212    AST 33 09/17/2014 1212    ALT 35 09/17/2014 1212    BILITOTAL 0.5 09/17/2014 1212        Lab Results   Component Value Date    CHOL 119 05/16/2014    CHOL 157 06/28/2013    CHOL 174 03/29/2013     Lab Results   Component Value Date    HDL 66 05/16/2014    HDL 70 06/28/2013    HDL 69 03/29/2013     Lab Results   Component Value Date    LDL 33 05/16/2014    LDL 66 06/28/2013    LDL 87 03/29/2013     Lab Results   Component Value Date    TRIG 98 05/16/2014    TRIG 103 06/28/2013    TRIG 92 03/29/2013     Lab Results   Component Value Date    TSH 1.17 06/28/2013    TSH 0.58 02/17/2011         Assessment:        1. Annual physical exam  CBC and differential    Comprehensive metabolic panel    Lipid panel    TSH   2. Breast cancer screening  Pap Smear, SurePath Method with HR HPV   3. Cervical cancer screening  Mammo Digital Screening Bilateral W Cad   4. Need for influenza vaccination  Flu vaccine QUAD 3 yrs & up PRESERVED   5. Uncontrolled type 2 diabetes mellitus with complication, with long-term current use of insulin  Hemoglobin A1C    Microalbumin, Random Urine    glucose blood (ONE TOUCH ULTRA TEST) test strip    insulin NPH-insulin regular (HUMULIN,NOVOLIN 70/30) (70-30) 100 UNIT/ML injection    Insulin Syringe-Needle U-100 (INSULIN SYRINGE 1CC/30GX5/16") 30G X 5/16" 1 ML Misc    metFORMIN (GLUCOPHAGE) 1000 MG tablet    Ambulatory referral to Endocrinology   6. Essential hypertension  atenolol (TENORMIN) 100 MG tablet    diltiazem (CARDIZEM CD) 300 MG 24 hr capsule    lisinopril (PRINIVIL,ZESTRIL) 40 MG tablet   7. Mixed hyperlipidemia  atorvastatin (LIPITOR) 20 MG tablet   8. Uterine prolapse  Ambulatory referral to Gynecology            Plan:       1.  Ordered the above tests as part of patient's annual physical exam.      2.  We have provided the following counseling/Anticipatory Guidance:  Nutrition, physical activity, healthy weight, injury prevention, misuse of tobacco, alcohol and drugs, sexual behavior and STDs, contraception, dental health, mental health, immunizations, screenings.     3.  Order for screening mammogram was given.    4.  We have sent a Pap smear.  Pelvic exam was significant for prolapse and referral to see gyn was given.    5.  Influenza vaccine administered today in clinic. Rest of immunizations are UTD by history    6.  Diabetes mellitus - Poorly controlled.  Advised to see endocrinology and referral was given.  Patient is hesitant to make any adjustments in her diabetes medications today and is hesitant to switch insulin as she states she has tried other forms of insulin in the past  including Lantus without much improvement.  We have discussed the long-term, occasions with diabetes including retinopathy, nephropathy, etc.  For now, she will continue current regimen as above.  We did advise that she should bring in her machine to her next visit and again should bring her machine to her visit with the new endocrinologist.  She is aware she should lose weight and monitor her carbohydrate intake.    7.  Hypertension - relatively well-controlled with blood pressure close to goal of under 130/80 mm Hg. She will continue current atenolol 100mg  daily, diltiazem 300 mg daily, and lisinopril 40 mg daily. Encourage patient to lose weight, follow a diet low in sodium, and also exercise regularly as tolerated.    8.  Hyperlipidemia - well-controlled on last check as above.  Repeat fasting lipids and chemistries.. She will continue current atorvastatin 20 mg daily.    9.  She will return to clinic in 3 months for follow-up of hypertension or sooner as needed.  Note, patient reports she has a follow-up appointment scheduled with her cardiologist for December.       Procedures

## 2014-12-19 LAB — LIPID PANEL
Cholesterol / HDL Ratio: 2.1 ratio units (ref 0.0–4.4)
Cholesterol: 144 mg/dL (ref 100–199)
HDL: 68 mg/dL (ref >39)
LDL Calculated: 56 mg/dL (ref 0–99)
Triglycerides: 102 mg/dL (ref 0–149)
VLDL Calculated: 20 mg/dL (ref 5–40)

## 2014-12-19 LAB — CBC AND DIFFERENTIAL
Baso(Absolute): 0 10*3/uL (ref 0.0–0.2)
Basos: 0 %
Eos: 2 %
Eosinophils Absolute: 0.2 10*3/uL (ref 0.0–0.4)
Hematocrit: 39.8 % (ref 34.0–46.6)
Hemoglobin: 13.4 g/dL (ref 11.1–15.9)
Immature Granulocytes Absolute: 0 10*3/uL (ref 0.0–0.1)
Immature Granulocytes: 0 %
Lymphocytes Absolute: 2.1 10*3/uL (ref 0.7–3.1)
Lymphocytes: 26 %
MCH: 33.8 pg — ABNORMAL HIGH (ref 26.6–33.0)
MCHC: 33.7 g/dL (ref 31.5–35.7)
MCV: 100 fL — ABNORMAL HIGH (ref 79–97)
Monocytes Absolute: 0.7 10*3/uL (ref 0.1–0.9)
Monocytes: 8 %
Neutrophils Absolute: 5.3 10*3/uL (ref 1.4–7.0)
Neutrophils: 64 %
Platelets: 268 10*3/uL (ref 150–379)
RBC: 3.97 x10E6/uL (ref 3.77–5.28)
RDW: 15.3 % (ref 12.3–15.4)
WBC: 8.3 10*3/uL (ref 3.4–10.8)

## 2014-12-19 LAB — COMPREHENSIVE METABOLIC PANEL
ALT: 29 IU/L (ref 0–32)
AST (SGOT): 32 IU/L (ref 0–40)
Albumin/Globulin Ratio: 1.3 (ref 1.1–2.5)
Albumin: 4 g/dL (ref 3.5–5.5)
Alkaline Phosphatase: 102 IU/L (ref 39–117)
BUN / Creatinine Ratio: 24 — ABNORMAL HIGH (ref 9–23)
BUN: 12 mg/dL (ref 6–24)
Bilirubin, Total: 0.4 mg/dL (ref 0.0–1.2)
CO2: 28 mmol/L (ref 18–29)
Calcium: 9.2 mg/dL (ref 8.7–10.2)
Chloride: 99 mmol/L (ref 97–106)
Creatinine: 0.51 mg/dL — ABNORMAL LOW (ref 0.57–1.00)
EGFR: 106 mL/min/{1.73_m2} (ref >59)
EGFR: 122 mL/min/{1.73_m2} (ref >59)
Globulin, Total: 3.1 g/dL (ref 1.5–4.5)
Glucose: 93 mg/dL (ref 65–99)
Potassium: 4.4 mmol/L (ref 3.5–5.2)
Protein, Total: 7.1 g/dL (ref 6.0–8.5)
Sodium: 139 mmol/L (ref 136–144)

## 2014-12-19 LAB — MICROALBUMIN, RANDOM URINE
Creatinine, UR: 59.2 mg/dL
Microalbumin, UR: 27.7 ug/mL
Microalbumin/Creatinine Ratio: 46.8 mg/g creat — ABNORMAL HIGH (ref 0.0–30.0)

## 2014-12-19 LAB — TSH: TSH: 1.34 u[IU]/mL (ref 0.450–4.500)

## 2014-12-19 LAB — HEMOGLOBIN A1C: Hemoglobin A1C: 9.4 % — ABNORMAL HIGH (ref 4.8–5.6)

## 2014-12-22 LAB — PAP IG, HPV-HR PROFILE
.: 0
HPV, high-risk: NEGATIVE

## 2015-01-23 ENCOUNTER — Encounter (INDEPENDENT_AMBULATORY_CARE_PROVIDER_SITE_OTHER): Payer: Self-pay | Admitting: Internal Medicine

## 2015-02-04 ENCOUNTER — Telehealth (INDEPENDENT_AMBULATORY_CARE_PROVIDER_SITE_OTHER): Payer: Self-pay | Admitting: Internal Medicine

## 2015-02-04 NOTE — Telephone Encounter (Signed)
Received a request from [facility] for patients most recent labs; faxed to [phone]

## 2015-02-05 ENCOUNTER — Encounter (INDEPENDENT_AMBULATORY_CARE_PROVIDER_SITE_OTHER): Payer: Self-pay | Admitting: Internal Medicine

## 2015-02-25 ENCOUNTER — Emergency Department: Payer: BLUE CROSS/BLUE SHIELD

## 2015-02-25 ENCOUNTER — Emergency Department
Admission: EM | Admit: 2015-02-25 | Discharge: 2015-02-25 | Disposition: A | Discharge status: Home / Self Care | Payer: HMO | Attending: Emergency Medicine | Admitting: Emergency Medicine

## 2015-02-25 DIAGNOSIS — T7840XA Allergy, unspecified, initial encounter: Secondary | ICD-10-CM | POA: Insufficient documentation

## 2015-02-25 DIAGNOSIS — I10 Essential (primary) hypertension: Secondary | ICD-10-CM | POA: Insufficient documentation

## 2015-02-25 DIAGNOSIS — Z7984 Long term (current) use of oral hypoglycemic drugs: Secondary | ICD-10-CM | POA: Insufficient documentation

## 2015-02-25 DIAGNOSIS — X58XXXA Exposure to other specified factors, initial encounter: Secondary | ICD-10-CM | POA: Insufficient documentation

## 2015-02-25 DIAGNOSIS — E11319 Type 2 diabetes mellitus with unspecified diabetic retinopathy without macular edema: Secondary | ICD-10-CM | POA: Insufficient documentation

## 2015-02-25 DIAGNOSIS — Z794 Long term (current) use of insulin: Secondary | ICD-10-CM | POA: Insufficient documentation

## 2015-02-25 DIAGNOSIS — H409 Unspecified glaucoma: Secondary | ICD-10-CM | POA: Insufficient documentation

## 2015-02-25 DIAGNOSIS — E785 Hyperlipidemia, unspecified: Secondary | ICD-10-CM | POA: Insufficient documentation

## 2015-02-25 DIAGNOSIS — Z7982 Long term (current) use of aspirin: Secondary | ICD-10-CM | POA: Insufficient documentation

## 2015-02-25 MED ORDER — EPINEPHRINE HCL 1 MG/ML ADULT ANAPHYLAXIS KIT
0.3000 mg | Freq: Once | INTRAMUSCULAR | Status: AC
Start: 2015-02-25 — End: 2015-02-25

## 2015-02-25 MED ORDER — DIPHENHYDRAMINE HCL 25 MG PO TABS
25.0000 mg | ORAL_TABLET | Freq: Four times a day (QID) | ORAL | Status: AC | PRN
Start: 2015-02-25 — End: 2015-03-01

## 2015-02-25 MED ORDER — FAMOTIDINE 10 MG/ML IV SOLN (WRAP)
20.0000 mg | Freq: Once | INTRAVENOUS | Status: AC
Start: 2015-02-25 — End: 2015-02-25
  Administered 2015-02-25: 20 mg via INTRAVENOUS
  Filled 2015-02-25: qty 2

## 2015-02-25 MED ORDER — EPINEPHRINE HCL 1 MG/ML ADULT ANAPHYLAXIS KIT
INTRAMUSCULAR | Status: AC
Start: 2015-02-25 — End: 2015-02-25
  Administered 2015-02-25: 0.3 mg via INTRAMUSCULAR
  Filled 2015-02-25: qty 1

## 2015-02-25 MED ORDER — DIPHENHYDRAMINE HCL 50 MG/ML IJ SOLN
50.0000 mg | Freq: Once | INTRAMUSCULAR | Status: AC
Start: 2015-02-25 — End: 2015-02-25
  Administered 2015-02-25: 50 mg via INTRAVENOUS
  Filled 2015-02-25: qty 1

## 2015-02-25 MED ORDER — METHYLPREDNISOLONE SODIUM SUCC 125 MG IJ SOLR
125.0000 mg | Freq: Once | INTRAMUSCULAR | Status: AC
Start: 2015-02-25 — End: 2015-02-25
  Administered 2015-02-25: 125 mg via INTRAVENOUS
  Filled 2015-02-25: qty 2

## 2015-02-25 MED ORDER — PREDNISONE 20 MG PO TABS
40.0000 mg | ORAL_TABLET | Freq: Every day | ORAL | Status: AC
Start: 2015-02-25 — End: 2015-03-01

## 2015-02-25 MED ORDER — FAMOTIDINE 20 MG PO TABS
20.0000 mg | ORAL_TABLET | Freq: Two times a day (BID) | ORAL | Status: AC
Start: 2015-02-25 — End: 2015-03-01

## 2015-02-25 MED ORDER — SODIUM CHLORIDE 0.9 % IV BOLUS
1000.0000 mL | Freq: Once | INTRAVENOUS | Status: AC
Start: 2015-02-25 — End: 2015-02-25
  Administered 2015-02-25: 1000 mL via INTRAVENOUS

## 2015-02-25 NOTE — ED Notes (Signed)
Bed: BL15  Expected date:   Expected time:   Means of arrival:   Comments:

## 2015-02-25 NOTE — Special Discharge Instructions (Signed)
Please drink plenty of fluids and rest. Please take medication as prescribed and follow up with allergist. Return to the emergency room if your symptoms return or worsen.

## 2015-02-25 NOTE — ED Provider Notes (Signed)
EMERGENCY DEPARTMENT HISTORY AND PHYSICAL EXAM     Physician/Midlevel provider first contact with patient: 02/25/15 0119         Date: 02/25/2015  Patient Name: [name]  Attending Physician:  Ames Dura, DO, FACOEP      History of Presenting Illness     Chief Complaint   Patient presents with   . Allergic Reaction       Due to language barrier, an interpreter was present during the history-taking and subsequent discussion (and for part of the physical exam) with this patient.      History Provided By: Pt and pt's daughter  Chief Complaint: Allergic reaction  Onset: 12:15 AM  Timing: sudden  Location: face and upper body  Quality: swelling and hives  Severity: moderate-severe  Modifying Factors: with no relief from Cetirizine  Associated sxs: itch, red rash over upper body, facial swelling  Negative sxs: CP or any other pain    Additional History: [name] is a 60 y.o. female with Hx of HTN and DM presenting to the ED for an allergic reaction onset 12:15 AM today. She did have an itch that is resolving, a red rash over her upper body, and facial and lip swelling. Her daughter states she had shrimp around 6PM and yogurt around 10PM but nothing else. She did not a bug bite that maybe related. She took Cetirizine with no relief. No Hx of MI. NKDA. No change in her medications recently     PCP: Castillo-Catoni, Maudry Mayhew, MD      No current facility-administered medications for this encounter.     Current Outpatient Prescriptions   Medication Sig Dispense Refill   . aspirin 81 MG tablet Take 81 mg by mouth daily.       Marland Kitchen atenolol (TENORMIN) 100 MG tablet Take 1 tablet (100 mg total) by mouth daily. 90 tablet 1   . atorvastatin (LIPITOR) 20 MG tablet Take 1 tablet (20 mg total) by mouth daily. 90 tablet 1   . Calcium Carbonate-Vitamin D (CALCIUM-D PO) Take 1 tablet by mouth daily.     Marland Kitchen diltiazem (CARDIZEM CD) 300 MG 24 hr capsule Take 1 capsule (300 mg total) by mouth daily. 90 capsule 1   . glucose  blood (ONE TOUCH ULTRA TEST) test strip Measure blood glucose 2 times a day 100 each 1   . insulin NPH-insulin regular (HUMULIN,NOVOLIN 70/30) (70-30) 100 UNIT/ML injection Take 72 units subcutaneously in the morning and 34 units in the evening 30 mL 2   . Insulin Syringe-Needle U-100 (INSULIN SYRINGE 1CC/30GX5/16") 30G X 5/16" 1 ML Misc Use syringe for insulin injection 2 times a day 100 each 1   . latanoprost (XALATAN) 0.005 % ophthalmic solution Place 1 drop into both eyes nightly.        Marland Kitchen lisinopril (PRINIVIL,ZESTRIL) 40 MG tablet Take 1 tablet (40 mg total) by mouth daily. 90 tablet 1   . metFORMIN (GLUCOPHAGE) 1000 MG tablet Take 1 tablet (1,000 mg total) by mouth 2 (two) times daily with meals. 180 tablet 1   . ONETOUCH DELICA LANCETS 33G Misc Check glucose 1-2 times a day 100 each 1   . pravastatin (PRAVACHOL) 20 MG tablet Take 20 mg by mouth daily.         Past Medical History   Past Medical History:  Past Medical History   Diagnosis Date   . Glaucoma 06/01/2012   . Diabetes mellitus type 2 in obese    .  Hypertension    . Hyperlipidemia    . Obesity    . Diabetic retinopathy 06/28/2013     Of the right eye, per patient   . Venous insufficiency 09/17/2014     Followed at [facility].  Wears compression stockings.        Past Surgical History:  Past Surgical History   Procedure Laterality Date   . Breast cyst excision  1993     bilateral   . Tubal ligation         Family History:  Family History   Problem Relation Age of Onset   . Heart disease Mother    . Diabetes Mother    . Hypertension Mother    . Hyperlipidemia Mother    . Heart disease Father    . Diabetes Father    . Hypertension Father    . Hyperlipidemia Father    . Cirrhosis Father    . Liver cancer Maternal Aunt    . Cancer Maternal Uncle      liver       Social History:  Social History   Substance Use Topics   . Smoking status: Never Smoker    . Smokeless tobacco: Never Used   . Alcohol Use: No       Allergies:  Allergies   Allergen  Reactions   . Unknown [Other]      Unsure of what caused reaction today- ate shrimp at 6pm       Review of Systems     Review of Systems   Constitutional:        + Allergic Reaction   HENT: Positive for facial swelling.    Cardiovascular: Negative for chest pain.   Musculoskeletal:        No pain anywhere   Skin: Positive for rash.        + itch   Allergic/Immunologic:        NKDA     Physical Exam     BP 166/75 mmHg  Pulse 82  Temp(Src) 97.9 F (36.6 C) (Oral)  Resp 18  Ht 5\' 6"  (1.676 m)  Wt 87.091 kg  BMI 31.00 kg/m2  SpO2 100%  Pulse Oximetry Analysis - Normal 98% on RA    Physical Exam   Constitutional: She appears well-developed and well-nourished. She appears distressed (mild discomfort).   HENT:   Head: Normocephalic and atraumatic.   Posterior pharynx clear and patent with no swelling, drooling, trismus.  There is some stridor with forced expirations.    Eyes: Conjunctivae are normal. No scleral icterus.   Neck: Normal range of motion. Neck supple.   Cardiovascular: Normal rate and regular rhythm.    Pulmonary/Chest: Effort normal and breath sounds normal. Stridor present. No respiratory distress. She has no wheezes.   Abdominal: Soft. There is no tenderness.   Musculoskeletal: Normal range of motion.   Neurological: She is alert. She exhibits normal muscle tone.   Skin: Skin is warm and dry. Rash noted. She is not diaphoretic. There is erythema.   Generalized macular erythroderma with some sporadic urticaria.    Psychiatric: She has a normal mood and affect. Her behavior is normal.   Vitals reviewed.    Diagnostic Study Results     Labs -     Results     ** No results found for the last 24 hours. **          Radiologic Studies -   Radiology Results (24 Hour)     **  No results found for the last 24 hours. **      .    Medical Decision Making   I am the first provider for this patient.    I reviewed the vital signs, available nursing notes, past medical history, past surgical history, family history  and social history.    Vital Signs-Reviewed the patient's vital signs.     Patient Vitals for the past 12 hrs:   BP Temp Pulse Resp   02/25/15 0243 166/75 mmHg - 82 18   02/25/15 0112 190/88 mmHg 97.9 F (36.6 C) 82 18       Cardiac Monitor:  Rate: 87  Rhythm:  Normal Sinus Rhythm    Old Medical Records: Old medical records.     Critical Care Time:     CRITICAL CARE: The high probability of sudden, clinically significant deterioration in the patient's condition required the highest level of my preparedness to intervene urgently.    The services I provided to this patient were to treat and/or prevent clinically significant deterioration that could result in: death or permanent disability.  Services included the following: chart data review, reviewing nursing notes and/or old charts, documentation time, consultant collaboration regarding findings and treatment options, medication orders and management, direct patient care, re-evaluations, vital sign assessments and ordering, interpreting and reviewing diagnostic studies/lab tests.    Aggregate critical care time was 50 minutes, which includes only time during which I was engaged in work directly related to the patient's care, as described above, whether at the bedside or elsewhere in the Emergency Department.  It did not include time spent performing other reported procedures or the services of residents, students, nurses or physician assistants.      Doctor's Notes     ED Course:     1:21 AM - Discussed treatment plan with pt. Pt is agreeable with plan.    1:48 AM - Pt feels improved after epinephrine. Stridor almost completely resolved as well as rash. Will continue to observe.    2:41 AM - Pt much more improved. No symptoms currently. No stridor, no distress. Oxygen 100% on RA. Would like to go home. Discussed importance of further outpatient management of allergic reaction, as well as allergist f/u and strict return precautions. Pt agrees with this plan and all  questions and concerns of pt and daughter at bedside were addressed.     Diagnosis and Treatment Plan       Clinical Impression:   1. Allergic reaction, initial encounter        Treatment Plan:   ED Disposition     None            _______________________________    Attestations:  This note is prepared by Georgina Quint, acting as scribe for [employer], DO, [place]. The scribe's documentation has been prepared under my direction and personally reviewed by me in its entirety.  I confirm that the note above accurately reflects all work, treatment, procedures, and medical decision making performed by me.     I am the first provider for this patient.      Ames Dura, DO, FACOEP is the primary emergency doctor of record.    _______________________________            Ames Dura, DO  02/25/15 0309

## 2015-02-25 NOTE — Discharge Instructions (Signed)
    Reacción alérgica        Allergic Reaction      1.   Usted ha sido atendido por una reacción alérgica.    1.   You have been seen for an allergic reaction.                    2.   Una reacción alérgica ocurre cuando su organismo reacciona a algo con lo que entra en contacto. Esto puede ser algo que comió. También puede ser algo que estuvo en su piel o algo que respiró. En ocasiones, las picaduras de insectos causan esta reacción. A menudo, las picaduras de avispas, avispones y abejas causan esta reacción.    2.   An allergic reaction is when your body reacts to something it comes in contact with. This can be something you ate. It can also be something that got on your skin or that you breathed in. Insect bites sometimes cause this reaction. Wasp, hornet and bee stings often cause this reaction.                    3.   Las reacciones alérgicas pueden ocasionar algunas cosas. Algunas personas presentan urticaria (ronchas grandes y levantadas). Otros presentan ampollas en su piel. Otras reacciones más serias incluyen hinchazón de labios y/o lengua. También incluyen dificultad para respirar. Esto puede ser con o sin resuellos.    3.   Allergic reactions can cause a few things to happen. Some people get hives (large raised welts). Others get blistered skin. More serious reactions include swelling of the lips and/or tongue. They also include difficulty breathing. This can be with or without wheezing.                    4.   A menudo, la causa exacta de la reacción alérgica nunca se encuentra.    4.   Often, the exact cause of the allergic reaction is never found.                    5.   Si descubre que es alérgico a algo, evítelo. Las reacciones alérgicas futuras podrían ser mucho peores.    5.   If you find you are allergic to something, avoid it. Future allergic reactions could get much worse.                    6.   El tratamiento general para una reacción alérgica incluye antihistamínicos como difenhidramina  (Benadryl®) o hidroxizina (Atarax®) en dosis que requieran receta y esteroides. Algunos antiácidos también actúan como antihistamínicos. Estos incluyen famotidina (Pepcid®), ranitidina (Zantac®) o cimetidina (Tagamet®). Estos son usados con frecuencia para ayudarle con la reacción alérgica.    6.   General treatment for an allergic reaction includes antihistamines like diphenhydramine (Benadryl®) or prescription strength hydroxyzine (Atarax®) and steroids. Some antacids also act like antihistamines. These include famotidine (Pepcid®), ranitidine (Zantac®) or cimetidine (Tagamet®). These are often used to help with the allergic reaction.                    7.   Si recibió una receta para esteroides, es importante que se termine todos los medicamentos. La reacción alérgica puede regresar súbitamente (rebotar) si de pronto suspende el tratamiento de esteroides demasiado pronto.    7.   If you get a steroid prescription, it is important to finish the entire prescription. The allergic reaction can come back suddenly (rebound) if you suddenly stop the steroid too early.                    8.   DEBE BUSCAR ATENCIÓN MÉDICA INMEDIATAMENTE, AQUÍ O EN LA SALA DE EMERGENCIAS MÁS CERCANA, SI SE PRESENTA CUALQUIERA DE LAS SIGUIENTES SITUACIONES:    8.   YOU SHOULD SEEK MEDICAL ATTENTION IMMEDIATELY, EITHER HERE OR AT THE NEAREST EMERGENCY DEPARTMENT, IF   ANY OF THE FOLLOWING OCCURS:        * Si sus labios o lengua se hinchan.      * Your lips or tongue get swollen.        * Si tiene dificultad para respirar o comienza a tener resuellos.      * You have trouble breathing or start wheezing.        * Si su sarpullido parece estar infectado. Los signos de la infección son enrojecimiento de la piel, pus, hinchazón o fiebre (temperatura mayor de 100.4°F/38°C).      * Your rash seems to get infected. Signs of infection are skin redness, pain, pus, swelling or fever (temperature higher than 100.4ºF / 38ºC).       

## 2015-02-25 NOTE — ED Notes (Signed)
Patient from home with c/o red rash over body, lip and facial swelling.    Patient's family states she ate shrimp at 6pm tonight, and strawberry yogurt at 10pm.    Patient A&Ox4. Patient denies difficulty breathing at this time.    BP 190/88 mmHg  Pulse 82  Temp(Src) 97.9 F (36.6 C) (Oral)  Resp 18  Ht 5\' 6"  (1.676 m)  Wt 87.091 kg  BMI 31.00 kg/m2  SpO2 98%

## 2015-02-26 ENCOUNTER — Telehealth (INDEPENDENT_AMBULATORY_CARE_PROVIDER_SITE_OTHER): Payer: Self-pay

## 2015-02-26 NOTE — Telephone Encounter (Signed)
ED Visit Follow Up Call      Facilty: [facility] Emergency Department    Discharge Date: 02/25/2015    Primary Discharge Dx: Allergic reaction    Prior ER Visits/Hospitalizations (past yr): None    Follow Up Appt with PCP/Specialist :  None    Outside care ordered:  (Home Health, PT, OT)     Have you been contacted to arrange care? No    Patient Questions:    What symptoms made you go to the ED? Swollen face, itchy throat and difficulty    How are you feeling today after your recent ED visit? "I'm feeling better".    Do you have any new or worsening symptoms since being seen?  No  What instructions did they give you at discharge?:    Do you have your printed copy of your discharge instructions? No     What questions do you have regarding your instructions? No     What new medications were prescribed at your visit or what changes to your medications were made? Benadryl, Pepcid and Prednisone       What questions do you have regarding your discharge medications? No    Do you have assistance at home and transportation to appointments? Yes        Advised to follow up as instructed.  Patient / family member verbalizes understanding and has no other questions or concerns.   Instructed to call back if symptoms change or worsen and/or go to the emergency room or call 911 for emergency symptoms.

## 2015-03-04 ENCOUNTER — Telehealth (INDEPENDENT_AMBULATORY_CARE_PROVIDER_SITE_OTHER): Payer: Self-pay | Admitting: Internal Medicine

## 2015-03-04 NOTE — Telephone Encounter (Signed)
Received phone call on nurse line from patient stating that she thinks she has a tooth infection and asking for a prescription for an antibiotic.  Called patient back to discuss and was unable to reach her.  Left message asking patient to call back as she needs to be seen.  I am able to see her tomorrow 03/05/15 at 7am.

## 2015-03-04 NOTE — Telephone Encounter (Signed)
Patient's daughter called back to say the mother will NOT make it in tomorrow at 7AM so do not come in early.

## 2015-03-05 NOTE — Telephone Encounter (Signed)
Message noted.

## 2015-03-11 ENCOUNTER — Other Ambulatory Visit (INDEPENDENT_AMBULATORY_CARE_PROVIDER_SITE_OTHER): Payer: Self-pay | Admitting: Internal Medicine

## 2015-03-20 ENCOUNTER — Encounter (INDEPENDENT_AMBULATORY_CARE_PROVIDER_SITE_OTHER): Payer: Self-pay | Admitting: Internal Medicine

## 2015-03-20 ENCOUNTER — Other Ambulatory Visit: Payer: HMO

## 2015-03-20 ENCOUNTER — Ambulatory Visit (INDEPENDENT_AMBULATORY_CARE_PROVIDER_SITE_OTHER): Payer: HMO | Admitting: Internal Medicine

## 2015-03-20 VITALS — BP 142/78 | HR 62 | Temp 97.6°F | Resp 14 | Ht 65.0 in | Wt 195.0 lb

## 2015-03-20 DIAGNOSIS — E118 Type 2 diabetes mellitus with unspecified complications: Secondary | ICD-10-CM

## 2015-03-20 DIAGNOSIS — E782 Mixed hyperlipidemia: Secondary | ICD-10-CM

## 2015-03-20 DIAGNOSIS — E1165 Type 2 diabetes mellitus with hyperglycemia: Secondary | ICD-10-CM

## 2015-03-20 DIAGNOSIS — IMO0002 Reserved for concepts with insufficient information to code with codable children: Secondary | ICD-10-CM

## 2015-03-20 DIAGNOSIS — Z794 Long term (current) use of insulin: Secondary | ICD-10-CM

## 2015-03-20 DIAGNOSIS — I10 Essential (primary) hypertension: Secondary | ICD-10-CM

## 2015-03-20 MED ORDER — LISINOPRIL 40 MG PO TABS
40.0000 mg | ORAL_TABLET | Freq: Every day | ORAL | Status: DC
Start: 2015-03-20 — End: 2015-06-22

## 2015-03-20 MED ORDER — HYDROCHLOROTHIAZIDE 12.5 MG PO CAPS
12.5000 mg | ORAL_CAPSULE | Freq: Every morning | ORAL | Status: DC
Start: 2015-03-20 — End: 2015-06-22

## 2015-03-20 MED ORDER — DILTIAZEM HCL ER COATED BEADS 300 MG PO CP24
300.0000 mg | ORAL_CAPSULE | Freq: Every day | ORAL | Status: DC
Start: 2015-03-20 — End: 2015-06-22

## 2015-03-20 MED ORDER — ATORVASTATIN CALCIUM 20 MG PO TABS
20.0000 mg | ORAL_TABLET | Freq: Every day | ORAL | Status: DC
Start: 2015-03-20 — End: 2015-06-22

## 2015-03-20 MED ORDER — ATENOLOL 100 MG PO TABS
100.0000 mg | ORAL_TABLET | Freq: Every day | ORAL | Status: DC
Start: 2015-03-20 — End: 2015-06-22

## 2015-03-20 MED ORDER — METFORMIN HCL 1000 MG PO TABS
1000.0000 mg | ORAL_TABLET | Freq: Two times a day (BID) | ORAL | Status: DC
Start: 2015-03-20 — End: 2015-06-22

## 2015-03-20 NOTE — Progress Notes (Signed)
Subjective:       Patient ID: [name] is a 60 y.o. female.  Chief Complaint   Patient presents with   . Diabetes Mellitus     has appointment with endocrinologist next week   . Hypertension   . Hyperlipidemia       HPI   Patient is a 60 year old Hispanic female with history of uncontrolled diabetes mellitus 2, hypertension, hyperlipidemia, obesity who presents today for follow-up of the above.    Since her last visit in January.  She has not lost any weight.  She reports she continues to be active at work and walks frequently.    She reports compliance with all her medications including diabetic medications and medications for hypertension.    Her blood pressure is elevated today.    He does not bring in her glucometer, but claims that she has been checking her glucose at home 2 times a day, ranging from the high 100s to the high 200s.  She denies any polydipsia or polyuria.  She does admit chronic, intermittent, extremity numbness and tingling.    She was given a referral to be seen by gynecology.  During her last visit 3 months ago and states she has an appointment to establish care with them next week.  She does not recall who she will be seeing.    She denies any frequent headaches, dizziness or lightheadedness, chest pain, palpitations, shortness of breath, orthopnea or PND.    He was also given a referral to gynecology for routine breast and pelvic exams with pertinent screenings but has not yet made an appointment.    Of note, since her last visit, she was seen in the emergency department in January with a fear ALLERGIC reaction to shrimp.    The following portions of the patient's history were reviewed and updated as appropriate: allergies, current medications, past medical history, past social history and problem list.    Past Medical History   Diagnosis Date   . Glaucoma 06/01/2012   . Diabetes mellitus type 2 in obese    . Hypertension    . Hyperlipidemia    . Obesity    . Diabetic retinopathy  06/28/2013     Of the right eye, per patient   . Venous insufficiency 09/17/2014     Followed at [facility].  Wears compression stockings.        Social History     Social History   . Marital Status: Single     Spouse Name: N/A   . Number of Children: 3   . Years of Education: N/A     Occupational History   . Maintenance      Social History Main Topics   . Smoking status: Never Smoker    . Smokeless tobacco: Never Used   . Alcohol Use: No   . Drug Use: No   . Sexual Activity: Not Currently     Other Topics Concern   . Not on file     Social History Narrative    Originally from [place].  Lives with her daughter.    Exercise: walks frequently       Allergies   Allergen Reactions   . Unknown [Other]      Unsure of what caused reaction today- ate shrimp at 6pm       Current Outpatient Prescriptions   Medication Sig Dispense Refill   . aspirin 81 MG tablet Take 81 mg by mouth daily.       Marland Kitchen  atenolol (TENORMIN) 100 MG tablet Take 1 tablet (100 mg total) by mouth daily. 90 tablet 1   . atorvastatin (LIPITOR) 20 MG tablet Take 1 tablet (20 mg total) by mouth daily. 90 tablet 1   . B-D INS SYR ULTRAFINE 1CC/31G 31G X 5/16" 1 ML Misc      . Calcium Carbonate-Vitamin D (CALCIUM-D PO) Take 1 tablet by mouth daily.     Marland Kitchen diltiazem (CARDIZEM CD) 300 MG 24 hr capsule Take 1 capsule (300 mg total) by mouth daily. 90 capsule 1   . glucose blood (ONE TOUCH ULTRA TEST) test strip Measure blood glucose 2 times a day 100 each 1   . Insulin Syringe-Needle U-100 (INSULIN SYRINGE 1CC/30GX5/16") 30G X 5/16" 1 ML Misc Use syringe for insulin injection 2 times a day 100 each 1   . latanoprost (XALATAN) 0.005 % ophthalmic solution Place 1 drop into both eyes nightly.        Marland Kitchen lisinopril (PRINIVIL,ZESTRIL) 40 MG tablet Take 1 tablet (40 mg total) by mouth daily. 90 tablet 1   . metFORMIN (GLUCOPHAGE) 1000 MG tablet Take 1 tablet (1,000 mg total) by mouth 2 (two) times daily with meals. 180 tablet 1   . NOVOLIN 70/30 (70-30) 100  UNIT/ML injection TAKE 72 UNITS SUBCUTANEOUSLY IN THE MORNING AND 34 UNITS IN THE EVENING 30 mL 1   . ONETOUCH DELICA LANCETS 33G Misc Check glucose 1-2 times a day 100 each 1   . pravastatin (PRAVACHOL) 20 MG tablet Take 20 mg by mouth daily.       No current facility-administered medications for this visit.       Review of Systems   Constitutional: Negative for fatigue.   Respiratory: Negative for chest tightness and shortness of breath.    Cardiovascular: Negative for chest pain and palpitations.   Gastrointestinal: Negative for nausea, vomiting, abdominal pain, diarrhea and constipation.   Endocrine: Negative for polydipsia and polyuria.   Genitourinary: Negative for dysuria and frequency.   Neurological: Positive for numbness (on bilateral hands and left foot). Negative for dizziness, light-headedness and headaches.         Objective:    Physical Exam   Constitutional: She is oriented to person, place, and time. She appears well-developed. No distress.   Obese   HENT:   Moist mucous membranes   Eyes: Conjunctivae are normal.   Cardiovascular: Normal rate and regular rhythm.  Exam reveals no gallop and no friction rub.    No murmur heard.  Pulmonary/Chest: Effort normal. No respiratory distress. She has no wheezes. She has no rales.   Abdominal: Soft. There is no tenderness.   Obese/protuberant abdomen; difficult to appreciate for any organomegaly   Musculoskeletal: Edema: trace lower extremity edema at ankles.   Neurological: She is alert and oriented to person, place, and time.   Skin: She is not diaphoretic.   Psychiatric: Her behavior is normal.   Vitals reviewed.    BP 153/76 mmHg  Pulse 62  Temp(Src) 97.6 F (36.4 C) (Oral)  Resp 14  Ht 1.651 m (5\' 5" )  Wt 88.451 kg (195 lb)  BMI 32.45 kg/m2    Lab Results   Component Value Date    HGBA1C 9.4* 12/18/2014     Lab Results   Component Value Date    WBC 8.3 12/18/2014    HGB 13.4 12/18/2014    HCT 39.8 12/18/2014    MCV 100* 12/18/2014    PLT 268  12/18/2014     '  Chemistry        Component Value Date/Time    NA 139 12/18/2014 0000    K 4.4 12/18/2014 0000    CL 99 12/18/2014 0000    CL 104 02/15/2014 0733    CO2 28 12/18/2014 0000    BUN 12 12/18/2014 0000    CREAT 0.51* 12/18/2014 0000    CREAT 0.8 09/17/2014 1212    CREAT 0.78 02/15/2014 0733    GLU 93 12/18/2014 0000    GLU 240* 09/17/2014 1212        Component Value Date/Time    CA 9.2 12/18/2014 0000    ALKPHOS 102 12/18/2014 0000    AST 32 12/18/2014 0000    ALT 29 12/18/2014 0000    BILITOTAL 0.4 12/18/2014 0000        Lab Results   Component Value Date    CHOL 144 12/18/2014    CHOL 119 05/16/2014    CHOL 157 06/28/2013     Lab Results   Component Value Date    HDL 68 12/18/2014    HDL 66 05/16/2014    HDL 70 06/28/2013     Lab Results   Component Value Date    LDL 56 12/18/2014    LDL 33 05/16/2014    LDL 66 06/28/2013     Lab Results   Component Value Date    TRIG 102 12/18/2014    TRIG 98 05/16/2014    TRIG 103 06/28/2013     Lab Results   Component Value Date    TSH 1.340 12/18/2014    TSH 1.17 06/28/2013    TSH 0.58 02/17/2011           Assessment:       1. Uncontrolled type 2 diabetes mellitus with complication, with long-term current use of insulin   uncontrolled, as evidenced by last hemoglobin A1c.  We will recheck a hemoglobin A1c and chemistries in preparation for her upcoming visit with her endocrinologist and have advised her to call us back with the name of her endocrinologist, so we can forward the results to her office.  For now, she will continue current metformin 1000 mg 2 times a day and also current doses of insulin until she is seen by her endocrinologist and adjustments are made.  She is due for her eye exam with the ophthalmologist and states that she has an appointment in late February.  Hemoglobin A1C    Comprehensive metabolic panel    metFORMIN (GLUCOPHAGE) 1000 MG tablet   2. Essential hypertension   suboptimally controlled.  We will add therapy with hydrochlorothiazide  12.5 mg daily, which it appears that she has been on in the past but is not sure why it was stopped.  She will continue the rest of her antihypertensive medications as above.  He did encourage weight loss, low-sodium diet and routine exercise as tolerated.  Comprehensive metabolic panel    hydroCHLOROthiazide (MICROZIDE) 12.5 MG capsule    atenolol (TENORMIN) 100 MG tablet    dilTIAZem (CARDIZEM CD) 300 MG 24 hr capsule    lisinopril (PRINIVIL,ZESTRIL) 40 MG tablet   3. Mixed hyperlipidemia   well-controlled on last lipid panel done in November.  She will continue current Lipitor 20 mg daily.  She should follow her healthy diet such as a Mediterranean diet.  Comprehensive metabolic panel    atorvastatin (LIPITOR) 20 MG tablet            Plan:  Plan is as above.    Risk & Benefits of the new medication(s) were explained to the patient (and family) who verbalized understanding & agreed to the treatment plan. Patient (family) encouraged to contact me/clinical staff with any questions/concerns    She will return to clinic in 3 months for follow-up of hypertension or sooner as needed.    Procedures

## 2015-03-20 NOTE — Patient Instructions (Signed)
Hydrochlorothiazide Oral tablet  What is this medicine?  HYDROCHLOROTHIAZIDE (hye droe klor oh THYE a zide) is a diuretic. It increases the amount of urine passed, which causes the body to lose salt and water. This medicine is used to treat high blood pressure. It is also reduces the swelling and water retention caused by various medical conditions, such as heart, liver, or kidney disease.  This medicine may be used for other purposes; ask your health care provider or pharmacist if you have questions.  What should I tell my health care provider before I take this medicine?  They need to know if you have any of these conditions:   diabetes   gout   immune system problems, like lupus   kidney disease or kidney stones   liver disease   pancreatitis   small amount of urine or difficulty passing urine   an unusual or allergic reaction to hydrochlorothiazide, sulfa drugs, other medicines, foods, dyes, or preservatives   pregnant or trying to get pregnant   breast-feeding  How should I use this medicine?  Take this medicine by mouth with a glass of water. Follow the directions on the prescription label. Take your medicine at regular intervals. Remember that you will need to pass urine frequently after taking this medicine. Do not take your doses at a time of day that will cause you problems. Do not stop taking your medicine unless your doctor tells you to.  Talk to your pediatrician regarding the use of this medicine in children. Special care may be needed.  Overdosage: If you think you have taken too much of this medicine contact a poison control center or emergency room at once.  NOTE: This medicine is only for you. Do not share this medicine with others.  What if I miss a dose?  If you miss a dose, take it as soon as you can. If it is almost time for your next dose, take only that dose. Do not take double or extra doses.  What may interact with this  medicine?   cholestyramine   colestipol   digoxin   dofetilide   lithium   medicines for blood pressure   medicines for diabetes   medicines that relax muscles for surgery   other diuretics   steroid medicines like prednisone or cortisone  This list may not describe all possible interactions. Give your health care provider a list of all the medicines, herbs, non-prescription drugs, or dietary supplements you use. Also tell them if you smoke, drink alcohol, or use illegal drugs. Some items may interact with your medicine.  What should I watch for while using this medicine?  Visit your doctor or health care professional for regular checks on your progress. Check your blood pressure as directed. Ask your doctor or health care professional what your blood pressure should be and when you should contact him or her.  You may need to be on a special diet while taking this medicine. Ask your doctor.  Check with your doctor or health care professional if you get an attack of severe diarrhea, nausea and vomiting, or if you sweat a lot. The loss of too much body fluid can make it dangerous for you to take this medicine.  You may get drowsy or dizzy. Do not drive, use machinery, or do anything that needs mental alertness until you know how this medicine affects you. Do not stand or sit up quickly, especially if you are an older patient. This reduces the  risk of dizzy or fainting spells. Alcohol may interfere with the effect of this medicine. Avoid alcoholic drinks.  This medicine may affect your blood sugar level. If you have diabetes, check with your doctor or health care professional before changing the dose of your diabetic medicine.  This medicine can make you more sensitive to the sun. Keep out of the sun. If you cannot avoid being in the sun, wear protective clothing and use sunscreen. Do not use sun lamps or tanning beds/booths.  What side effects may I notice from receiving this medicine?  Side effects that you  should report to your doctor or health care professional as soon as possible:   allergic reactions such as skin rash or itching, hives, swelling of the lips, mouth, tongue, or throat   changes in vision   chest pain   eye pain   fast or irregular heartbeat   feeling faint or lightheaded, falls   gout attack   muscle pain or cramps   pain or difficulty when passing urine   pain, tingling, numbness in the hands or feet   redness, blistering, peeling or loosening of the skin, including inside the mouth   unusually weak or tired  Side effects that usually do not require medical attention (report to your doctor or health care professional if they continue or are bothersome):   change in sex drive or performance   dry mouth   headache   stomach upset  This list may not describe all possible side effects. Call your doctor for medical advice about side effects. You may report side effects to FDA at 1-800-FDA-1088.  Where should I keep my medicine?  Keep out of the reach of children.  Store at room temperature between 15 and 30 degrees C (59 and 86 degrees F). Do not freeze. Protect from light and moisture. Keep container closed tightly. Throw away any unused medicine after the expiration date.  NOTE:This sheet is a summary. It may not cover all possible information. If you have questions about this medicine, talk to your doctor, pharmacist, or health care provider. Copyright 2015 [employer]

## 2015-03-21 LAB — COMPREHENSIVE METABOLIC PANEL
ALT: 31 IU/L (ref 0–32)
AST (SGOT): 28 IU/L (ref 0–40)
Albumin/Globulin Ratio: 1.5 (ref 1.1–2.5)
Albumin: 4.1 g/dL (ref 3.5–5.5)
Alkaline Phosphatase: 125 IU/L — ABNORMAL HIGH (ref 39–117)
BUN / Creatinine Ratio: 27 — ABNORMAL HIGH (ref 9–23)
BUN: 17 mg/dL (ref 6–24)
Bilirubin, Total: 0.3 mg/dL (ref 0.0–1.2)
CO2: 23 mmol/L (ref 18–29)
Calcium: 9.1 mg/dL (ref 8.7–10.2)
Chloride: 98 mmol/L (ref 96–106)
Creatinine: 0.62 mg/dL (ref 0.57–1.00)
EGFR: 114 mL/min/{1.73_m2} (ref >59)
EGFR: 99 mL/min/{1.73_m2} (ref >59)
Globulin, Total: 2.8 g/dL (ref 1.5–4.5)
Glucose: 207 mg/dL — ABNORMAL HIGH (ref 65–99)
Potassium: 4.4 mmol/L (ref 3.5–5.2)
Protein, Total: 6.9 g/dL (ref 6.0–8.5)
Sodium: 139 mmol/L (ref 134–144)

## 2015-03-21 LAB — HEMOGLOBIN A1C: Hemoglobin A1C: 9.9 % — ABNORMAL HIGH (ref 4.8–5.6)

## 2015-04-03 ENCOUNTER — Encounter (INDEPENDENT_AMBULATORY_CARE_PROVIDER_SITE_OTHER): Payer: Self-pay | Admitting: Internal Medicine

## 2015-04-07 ENCOUNTER — Encounter (INDEPENDENT_AMBULATORY_CARE_PROVIDER_SITE_OTHER): Payer: Self-pay | Admitting: Internal Medicine

## 2015-04-08 ENCOUNTER — Encounter (INDEPENDENT_AMBULATORY_CARE_PROVIDER_SITE_OTHER): Payer: Self-pay | Admitting: Internal Medicine

## 2015-04-08 DIAGNOSIS — IMO0002 Reserved for concepts with insufficient information to code with codable children: Secondary | ICD-10-CM | POA: Insufficient documentation

## 2015-05-09 ENCOUNTER — Other Ambulatory Visit (INDEPENDENT_AMBULATORY_CARE_PROVIDER_SITE_OTHER): Payer: Self-pay | Admitting: Internal Medicine

## 2015-05-21 ENCOUNTER — Ambulatory Visit (INDEPENDENT_AMBULATORY_CARE_PROVIDER_SITE_OTHER): Payer: HMO | Admitting: Specialist

## 2015-06-03 ENCOUNTER — Encounter (INDEPENDENT_AMBULATORY_CARE_PROVIDER_SITE_OTHER): Payer: Self-pay | Admitting: Internal Medicine

## 2015-06-15 ENCOUNTER — Encounter (INDEPENDENT_AMBULATORY_CARE_PROVIDER_SITE_OTHER): Payer: Self-pay | Admitting: Internal Medicine

## 2015-06-16 ENCOUNTER — Other Ambulatory Visit (INDEPENDENT_AMBULATORY_CARE_PROVIDER_SITE_OTHER): Payer: Self-pay

## 2015-06-22 ENCOUNTER — Encounter (INDEPENDENT_AMBULATORY_CARE_PROVIDER_SITE_OTHER): Payer: Self-pay | Admitting: Internal Medicine

## 2015-06-22 ENCOUNTER — Other Ambulatory Visit: Payer: HMO

## 2015-06-22 ENCOUNTER — Ambulatory Visit (INDEPENDENT_AMBULATORY_CARE_PROVIDER_SITE_OTHER): Payer: HMO | Admitting: Internal Medicine

## 2015-06-22 VITALS — BP 169/73 | HR 62 | Temp 98.3°F | Resp 20 | Wt 192.2 lb

## 2015-06-22 DIAGNOSIS — E782 Mixed hyperlipidemia: Secondary | ICD-10-CM

## 2015-06-22 DIAGNOSIS — Z794 Long term (current) use of insulin: Secondary | ICD-10-CM

## 2015-06-22 DIAGNOSIS — IMO0002 Reserved for concepts with insufficient information to code with codable children: Secondary | ICD-10-CM

## 2015-06-22 DIAGNOSIS — E118 Type 2 diabetes mellitus with unspecified complications: Secondary | ICD-10-CM

## 2015-06-22 DIAGNOSIS — E1165 Type 2 diabetes mellitus with hyperglycemia: Secondary | ICD-10-CM

## 2015-06-22 DIAGNOSIS — I10 Essential (primary) hypertension: Secondary | ICD-10-CM

## 2015-06-22 MED ORDER — INSULIN SYRINGE 30G X 5/16" 1 ML MISC
Status: DC
Start: 2015-06-22 — End: 2015-09-04

## 2015-06-22 MED ORDER — HYDROCHLOROTHIAZIDE 12.5 MG PO CAPS
12.5000 mg | ORAL_CAPSULE | Freq: Every morning | ORAL | Status: DC
Start: 2015-06-22 — End: 2015-09-04

## 2015-06-22 MED ORDER — INSULIN NPH ISOPHANE & REGULAR (70-30) 100 UNIT/ML SC SUSP
SUBCUTANEOUS | Status: DC
Start: 2015-06-22 — End: 2015-06-22

## 2015-06-22 MED ORDER — INSULIN NPH ISOPHANE & REGULAR (70-30) 100 UNIT/ML SC SUSP
SUBCUTANEOUS | Status: DC
Start: 2015-06-22 — End: 2015-08-19

## 2015-06-22 MED ORDER — ATENOLOL 100 MG PO TABS
100.0000 mg | ORAL_TABLET | Freq: Every day | ORAL | Status: DC
Start: 2015-06-22 — End: 2015-09-04

## 2015-06-22 MED ORDER — DILTIAZEM HCL ER COATED BEADS 360 MG PO CP24
360.0000 mg | ORAL_CAPSULE | Freq: Every day | ORAL | Status: DC
Start: 2015-06-22 — End: 2015-09-04

## 2015-06-22 MED ORDER — METFORMIN HCL 1000 MG PO TABS
1000.0000 mg | ORAL_TABLET | Freq: Two times a day (BID) | ORAL | Status: DC
Start: 2015-06-22 — End: 2016-01-05

## 2015-06-22 MED ORDER — ATORVASTATIN CALCIUM 20 MG PO TABS
20.0000 mg | ORAL_TABLET | Freq: Every day | ORAL | Status: DC
Start: 2015-06-22 — End: 2016-02-22

## 2015-06-22 MED ORDER — LISINOPRIL 40 MG PO TABS
40.0000 mg | ORAL_TABLET | Freq: Every day | ORAL | Status: DC
Start: 2015-06-22 — End: 2016-02-22

## 2015-06-22 NOTE — Progress Notes (Signed)
Has the patient sought any care outside of the [facility]? No

## 2015-06-22 NOTE — Progress Notes (Signed)
Subjective:      Patient ID: [name] is a 60 y.o. female     Chief Complaint   Patient presents with   . Diabetes Mellitus     3 month F/U   . Hypertension     BP's at home in 160's/60'smmHg        HPI   60 year old Hispanic female with history of obesity, uncontrolled diabetes mellitus 2, hypertension, hyperlipidemia, venous insufficiency, followed by a pain specialist, among others, who presents today for follow up of the above.    Since her last visit in February, she has lost about 3 pounds per chart reports she is very active walking.    Her last A1c was 9.9 percent in February.  Reports compliance with insulin as below and metformin.  Claims that she checks her blood glucose at home, but has not brought in her glucometer to this visit her prior visits.  Reports that her morning fasting glucoses in the 100s and afternoon glucose is around 110s.  Denies any polydipsia or polyuria.  Admits occasional numbness in her hands and feet.  She does have an upcoming appointment with endocrinologist, Dr. Marcellina Millin in June.    In regards to her hypertension, she reports compliance with all her antihypertensive medications as listed below.Patient denies any frequent headaches, chest pain, palpitations, shortness of breath, orthopnea or PND.      Last lipid panel was done in November and showed good control.  Reports compliance with Lipitor.  Denies any frequent myalgias or abdominal pain.    As her last visit, she was seen by gynecology and diagnosed with cystocele for which she has been referred to a-year-old gynecologist.    [facility] and apparently recently had injections.    The following portions of the patient's history were reviewed and updated as appropriate: allergies, current medications, past medical history, past social history and problem list.     Past Medical History   Diagnosis Date   . Glaucoma 06/01/2012   . Diabetes mellitus type 2 in obese    .  Hypertension    . Hyperlipidemia    . Obesity    . Diabetic retinopathy 06/28/2013     Of the right eye, per patient   . Venous insufficiency 09/17/2014     Followed at [facility].  Wears compression stockings.        Social History     Social History   . Marital Status: Single     Spouse Name: N/A   . Number of Children: 3   . Years of Education: N/A     Occupational History   . Maintenance      Social History Main Topics   . Smoking status: Never Smoker    . Smokeless tobacco: Never Used   . Alcohol Use: No   . Drug Use: No   . Sexual Activity: Not Currently     Other Topics Concern   . Not on file     Social History Narrative    Originally from [place].  Lives with her daughter.    Exercise: walks frequently       Allergies   Allergen Reactions   . Unknown [Other]      Unsure of what caused reaction today- ate shrimp at 6pm       Current Outpatient Prescriptions   Medication Sig Dispense Refill   . aspirin 81 MG tablet Take  81 mg by mouth daily.       Marland Kitchen atenolol (TENORMIN) 100 MG tablet Take 1 tablet (100 mg total) by mouth daily. 90 tablet 1   . atorvastatin (LIPITOR) 20 MG tablet Take 1 tablet (20 mg total) by mouth daily. 90 tablet 1   . B-D INS SYR ULTRAFINE 1CC/31G 31G X 5/16" 1 ML Misc      . Calcium Carbonate-Vitamin D (CALCIUM-D PO) Take 1 tablet by mouth daily.     . dilTIAZem (CARDIZEM CD) 300 MG 24 hr capsule Take 1 capsule (300 mg total) by mouth daily. 90 capsule 1   . glucose blood (ONE TOUCH ULTRA TEST) test strip Measure blood glucose 2 times a day 100 each 1   . hydroCHLOROthiazide (MICROZIDE) 12.5 MG capsule Take 1 capsule (12.5 mg total) by mouth every morning. 30 capsule 3   . Insulin Syringe-Needle U-100 (INSULIN SYRINGE 1CC/30GX5/16") 30G X 5/16" 1 ML Misc Use syringe for insulin injection 2 times a day 100 each 1   . latanoprost (XALATAN) 0.005 % ophthalmic solution Place 1 drop into both eyes nightly.        Marland Kitchen lisinopril (PRINIVIL,ZESTRIL) 40 MG tablet Take 1 tablet (40 mg  total) by mouth daily. 90 tablet 1   . metFORMIN (GLUCOPHAGE) 1000 MG tablet Take 1 tablet (1,000 mg total) by mouth 2 (two) times daily with meals. 180 tablet 1   . NOVOLIN 70/30 (70-30) 100 UNIT/ML injection TAKE 72 UNITS SUBCUTANEOUSLY IN THE MORNING AND 34 UNITS IN THE EVENING 30 mL 0   . ONETOUCH DELICA LANCETS 33G Misc Check glucose 1-2 times a day 100 each 1     No current facility-administered medications for this visit.         Review of Systems   Constitutional: Negative for fatigue.   Respiratory: Negative for chest tightness and shortness of breath.    Cardiovascular: Negative for chest pain and palpitations.   Gastrointestinal: Negative for nausea, vomiting, abdominal pain, diarrhea and constipation.   Endocrine: Negative for polydipsia and polyuria.   Genitourinary: Negative for dysuria, frequency and hematuria.   Musculoskeletal: Negative for myalgias.   Neurological: Positive for numbness (occasional numbness in feet) and headaches (occasionally). Negative for dizziness and light-headedness.       Objective:     Physical Exam   Constitutional: She is oriented to person, place, and time. She appears well-developed. No distress.   Obese   HENT:   Moist oral mucous membranes.  Wearing dentures.   Eyes: Conjunctivae are normal.   Cardiovascular: Normal rate and regular rhythm.    Murmur (2/6 systolic murmur heard at sternal border) heard.  Pulmonary/Chest: Effort normal. No respiratory distress. She has no wheezes. She has no rales.   Abdominal: Soft. She exhibits no mass. There is no tenderness.   Musculoskeletal: She exhibits no edema.   Neurological: She is alert and oriented to person, place, and time.   Skin: She is not diaphoretic.   Psychiatric: Her behavior is normal.   Vitals reviewed.     BP 169/73 mmHg  Pulse 62  Temp(Src) 98.3 F (36.8 C) (Oral)  Resp 20  Wt 87.181 kg (192 lb 3.2 oz)      Lab Results   Component Value Date    HGBA1C 9.9* 03/20/2015     Lab Results   Component Value Date     WBC 8.3 12/18/2014    HGB 13.4 12/18/2014    HCT 39.8 12/18/2014    MCV  100* 12/18/2014    PLT 268 12/18/2014     '  Chemistry        Component Value Date/Time    NA 139 03/20/2015 0000    K 4.4 03/20/2015 0000    CL 98 03/20/2015 0000    CL 104 02/15/2014 0733    CO2 23 03/20/2015 0000    BUN 17 03/20/2015 0000    CREAT 0.62 03/20/2015 0000    CREAT 0.8 09/17/2014 1212    CREAT 0.78 02/15/2014 0733    GLU 207* 03/20/2015 0000    GLU 240* 09/17/2014 1212        Component Value Date/Time    CA 9.1 03/20/2015 0000    ALKPHOS 125* 03/20/2015 0000    AST 28 03/20/2015 0000    ALT 31 03/20/2015 0000    BILITOTAL 0.3 03/20/2015 0000        Lab Results   Component Value Date    CHOL 144 12/18/2014    CHOL 119 05/16/2014    CHOL 157 06/28/2013     Lab Results   Component Value Date    HDL 68 12/18/2014    HDL 66 05/16/2014    HDL 70 06/28/2013     Lab Results   Component Value Date    LDL 56 12/18/2014    LDL 33 05/16/2014    LDL 66 06/28/2013     Lab Results   Component Value Date    TRIG 102 12/18/2014    TRIG 98 05/16/2014    TRIG 103 06/28/2013     Lab Results   Component Value Date    TSH 1.340 12/18/2014    TSH 1.17 06/28/2013    TSH 0.58 02/17/2011           Assessment:     1. Uncontrolled type 2 diabetes mellitus with complication, with long-term current use of insulin  - Uncontrolled.  I am not sure that patient has truly been checking her blood sugars regularly as she has never brought in her glucometer.  -  We will check a hemoglobin A1c and chemistries to reevaluate and have encouraged her to keep her appointment with endocrinology.  - Continue current medication regimen for now and adjustments to be made by patient's endocrinologist if needed during her appointment.  - Patient aware she needs to have at least a yearly eye exam, more frequently given her history of glaucoma.  - Hemoglobin A1C  - Comprehensive metabolic panel  - metFORMIN (GLUCOPHAGE) 1000 MG tablet; Take 1 tablet (1,000 mg total) by mouth 2  (two) times daily with meals.  Dispense: 180 tablet; Refill: 1  - Insulin Syringe-Needle U-100 (INSULIN SYRINGE 1CC/30GX5/16") 30G X 5/16" 1 ML Misc; Use syringe for insulin injection 2 times a day  Dispense: 100 each; Refill: 1  - insulin NPH-regular 70-30 MIXTURE (NOVOLIN 70/30) (70-30) 100 UNIT/ML injection; TAKE 72 UNITS SUBCUTANEOUSLY IN THE MORNING AND 34 UNITS IN THE EVENING  Dispense: 30 mL; Refill: 0    2. Essential hypertension  - Uncontrolled blood pressure above goal of under 130/80 mmHg.  We will increase the dose of diltiazem from 300 mg daily to 360 mg daily and continue current doses of atenolol, hydrochlorothiazide and lisinopril.  - Comprehensive metabolic panel  - dilTIAZem (CARDIZEM CD) 360 MG 24 hr capsule; Take 1 capsule (360 mg total) by mouth daily.  Dispense: 90 capsule; Refill: 1  - atenolol (TENORMIN) 100 MG tablet; Take 1 tablet (100 mg total) by mouth daily.  Dispense: 90 tablet; Refill: 1  - hydroCHLOROthiazide (MICROZIDE) 12.5 MG capsule; Take 1 capsule (12.5 mg total) by mouth every morning.  Dispense: 90 capsule; Refill: 0  - lisinopril (PRINIVIL,ZESTRIL) 40 MG tablet; Take 1 tablet (40 mg total) by mouth daily.  Dispense: 90 tablet; Refill: 1    3. Mixed hyperlipidemia  - Controlled on last lipid panel done in November.  Patient is not fasting today to recheck fasting lipid panel.  - TU current Lipitor 20 mg daily for now.  Comprehensive metabolic panel  - atorvastatin (LIPITOR) 20 MG tablet; Take 1 tablet (20 mg total) by mouth daily.  Dispense: 90 tablet; Refill: 1       Plan:     Plan is as above.    Return to clinic in 3 months for follow-up of hypertension or sooner as needed.    Standley Dakins, MD

## 2015-06-23 LAB — COMPREHENSIVE METABOLIC PANEL
ALT: 26 IU/L (ref 0–32)
AST (SGOT): 29 IU/L (ref 0–40)
Albumin/Globulin Ratio: 1.3 (ref 1.2–2.2)
Albumin: 4.3 g/dL (ref 3.5–5.5)
Alkaline Phosphatase: 118 IU/L — ABNORMAL HIGH (ref 39–117)
BUN / Creatinine Ratio: 19 (ref 9–23)
BUN: 12 mg/dL (ref 6–24)
Bilirubin, Total: 0.4 mg/dL (ref 0.0–1.2)
CO2: 25 mmol/L (ref 18–29)
Calcium: 9.5 mg/dL (ref 8.7–10.2)
Chloride: 98 mmol/L (ref 96–106)
Creatinine: 0.64 mg/dL (ref 0.57–1.00)
EGFR: 113 mL/min/{1.73_m2} (ref >59)
EGFR: 98 mL/min/{1.73_m2} (ref >59)
Globulin, Total: 3.2 g/dL (ref 1.5–4.5)
Glucose: 237 mg/dL — ABNORMAL HIGH (ref 65–99)
Potassium: 4.8 mmol/L (ref 3.5–5.2)
Protein, Total: 7.5 g/dL (ref 6.0–8.5)
Sodium: 139 mmol/L (ref 134–144)

## 2015-06-23 LAB — HEMOGLOBIN A1C: Hemoglobin A1C: 7.8 % — ABNORMAL HIGH (ref 4.8–5.6)

## 2015-07-03 ENCOUNTER — Ambulatory Visit (INDEPENDENT_AMBULATORY_CARE_PROVIDER_SITE_OTHER): Payer: HMO | Admitting: Internal Medicine

## 2015-07-03 ENCOUNTER — Encounter (INDEPENDENT_AMBULATORY_CARE_PROVIDER_SITE_OTHER): Payer: Self-pay | Admitting: Internal Medicine

## 2015-07-03 VITALS — BP 118/63 | HR 60 | Temp 98.0°F | Resp 18 | Ht 64.0 in | Wt 195.2 lb

## 2015-07-03 DIAGNOSIS — J069 Acute upper respiratory infection, unspecified: Secondary | ICD-10-CM

## 2015-07-03 MED ORDER — BENZONATATE 100 MG PO CAPS
100.0000 mg | ORAL_CAPSULE | Freq: Three times a day (TID) | ORAL | Status: DC | PRN
Start: 2015-07-03 — End: 2015-11-24

## 2015-07-03 MED ORDER — AZITHROMYCIN 250 MG PO TABS
ORAL_TABLET | ORAL | Status: AC
Start: 2015-07-03 — End: 2015-07-08

## 2015-07-03 NOTE — Patient Instructions (Signed)
Azithromycin Oral tablet  What is this medicine?  AZITHROMYCIN (az ith roe MYE sin) is a macrolide antibiotic. It is used to treat or prevent certain kinds of bacterial infections. It will not work for colds, flu, or other viral infections.  This medicine may be used for other purposes; ask your health care provider or pharmacist if you have questions.  What should I tell my health care provider before I take this medicine?  They need to know if you have any of these conditions:   kidney disease   liver disease   irregular heartbeat or heart disease   an unusual or allergic reaction to azithromycin, erythromycin, other macrolide antibiotics, foods, dyes, or preservatives   pregnant or trying to get pregnant   breast-feeding  How should I use this medicine?  Take this medicine by mouth with a full glass of water. Follow the directions on the prescription label. The tablets can be taken with food or on an empty stomach. If the medicine upsets your stomach, take it with food. Take your medicine at regular intervals. Do not take your medicine more often than directed. Take all of your medicine as directed even if you think your are better. Do not skip doses or stop your medicine early.  Talk to your pediatrician regarding the use of this medicine in children. Special care may be needed.  Overdosage: If you think you have taken too much of this medicine contact a poison control center or emergency room at once.  NOTE: This medicine is only for you. Do not share this medicine with others.  What if I miss a dose?  If you miss a dose, take it as soon as you can. If it is almost time for your next dose, take only that dose. Do not take double or extra doses.  What may interact with this medicine?  Do not take this medicine with any of the following medications:   lincomycin  This medicine may also interact with the following medications:   amiodarone   antacids   birth control  pills   cyclosporine   digoxin   magnesium   nelfinavir   phenytoin   warfarin  This list may not describe all possible interactions. Give your health care provider a list of all the medicines, herbs, non-prescription drugs, or dietary supplements you use. Also tell them if you smoke, drink alcohol, or use illegal drugs. Some items may interact with your medicine.  What should I watch for while using this medicine?  Tell your doctor or health care professional if your symptoms do not improve.  Do not treat diarrhea with over the counter products. Contact your doctor if you have diarrhea that lasts more than 2 days or if it is severe and watery.  This medicine can make you more sensitive to the sun. Keep out of the sun. If you cannot avoid being in the sun, wear protective clothing and use sunscreen. Do not use sun lamps or tanning beds/booths.  What side effects may I notice from receiving this medicine?  Side effects that you should report to your doctor or health care professional as soon as possible:   allergic reactions like skin rash, itching or hives, swelling of the face, lips, or tongue   confusion, nightmares or hallucinations   dark urine   difficulty breathing   hearing loss   irregular heartbeat or chest pain   pain or difficulty passing urine   redness, blistering, peeling or loosening of   the skin, including inside the mouth   white patches or sores in the mouth   yellowing of the eyes or skin  Side effects that usually do not require medical attention (report to your doctor or health care professional if they continue or are bothersome):   diarrhea   dizziness, drowsiness   headache   stomach upset or vomiting   tooth discoloration   vaginal irritation  This list may not describe all possible side effects. Call your doctor for medical advice about side effects. You may report side effects to FDA at 1-800-FDA-1088.  Where should I keep my medicine?  Keep out of the reach of  children.  Store at room temperature between 15 and 30 degrees C (59 and 86 degrees F). Throw away any unused medicine after the expiration date.  NOTE:This sheet is a summary. It may not cover all possible information. If you have questions about this medicine, talk to your doctor, pharmacist, or health care provider. Copyright 2015 Gold Standard              Benzonatate capsules  What is this medicine?  BENZONATATE (ben ZOE na tate) is used to treat cough.  How should I use this medicine?  Take this medicine by mouth with a glass of water. Follow the directions on the prescription label. Avoid breaking, chewing, or sucking the capsule, as this can cause serious side effects. Take your medicine at regular intervals. Do not take your medicine more often than directed.  Talk to your pediatrician regarding the use of this medicine in children. While this drug may be prescribed for children as young as 10 years old for selected conditions, precautions do apply.  What side effects may I notice from receiving this medicine?  Side effects that you should report to your doctor or health care professional as soon as possible:   allergic reactions like skin rash, itching or hives, swelling of the face, lips, or tongue   breathing problems   chest pain   confusion or hallucinations   irregular heartbeat   numbness of mouth or throat   seizures  Side effects that usually do not require medical attention (report to your doctor or health care professional if they continue or are bothersome):   burning feeling in the eyes   constipation   headache   nasal congestion   stomach upset  What may interact with this medicine?  Do not take this medicine with any of the following medications:   MAOIs like Carbex, Eldepryl, Marplan, Nardil, and Parnate  What if I miss a dose?  If you miss a dose, take it as soon as you can. If it is almost time for your next dose, take only that dose. Do not take double or extra  doses.  Where should I keep my medicine?  Keep out of the reach of children.  Store at room temperature between 15 and 30 degrees C (59 and 86 degrees F). Keep tightly closed. Protect from light and moisture. Throw away any unused medicine after the expiration date.  What should I tell my health care provider before I take this medicine?  They need to know if you have any of these conditions:   kidney or liver disease   an unusual or allergic reaction to benzonatate, anesthetics, other medicines, foods, dyes, or preservatives   pregnant or trying to get pregnant   breast-feeding  What should I watch for while using this medicine?  Tell your doctor   if your symptoms do not improve or if they get worse. If you have a high fever, skin rash, or headache, see your health care professional.  You may get drowsy or dizzy. Do not drive, use machinery, or do anything that needs mental alertness until you know how this medicine affects you. Do not sit or stand up quickly, especially if you are an older patient. This reduces the risk of dizzy or fainting spells.  Date Last Reviewed:   NOTE:This sheet is a summary. It may not cover all possible information. If you have questions about this medicine, talk to your doctor, pharmacist, or health care provider. Copyright 2016 Gold Standard

## 2015-07-03 NOTE — Progress Notes (Signed)
Subjective:      Patient ID: [name] is a 60 y.o. female   Seen in clinic on Jul 03, 2015.  Chief Complaint   Patient presents with   . Cough     past 5 days   . Sore Throat     past 5 days        HPI   Patient is a 61 year old Hispanic female with history of obesity, diabetes mellitus 2, hypertension, dyslipidemia, among others here today for evaluation of the above     Reports 5 days of sore throat, cough and hoarseness.  Denies any fevers, chills, night sweats, your pain, shortness of breath, wheezing, nausea, vomiting, diarrhea or rashes.   Has been taking Tylenol without much improvement of her symptoms.   Admits some postnasal drip.    Has appointment with endocrinologist on June 13 for management of diabetes.      The following portions of the patient's history were reviewed and updated as appropriate: allergies, current medications, past medical history, past social history and problem list.     Past Medical History   Diagnosis Date   . Glaucoma 06/01/2012   . Diabetes mellitus type 2 in obese    . Hypertension    . Hyperlipidemia    . Obesity    . Diabetic retinopathy 06/28/2013     Of the right eye, per patient   . Venous insufficiency 09/17/2014     Followed at [facility].  Wears compression stockings.        Social History     Social History   . Marital Status: Single     Spouse Name: N/A   . Number of Children: 3   . Years of Education: N/A     Occupational History   . Maintenance      Social History Main Topics   . Smoking status: Never Smoker    . Smokeless tobacco: Never Used   . Alcohol Use: No   . Drug Use: No   . Sexual Activity: Not Currently     Other Topics Concern   . Not on file     Social History Narrative    Originally from [place].  Lives with her daughter.    Exercise: walks frequently       Allergies   Allergen Reactions   . Unknown [Other]      Unsure of what caused reaction today- ate shrimp at 6pm       Current Outpatient Prescriptions   Medication Sig  Dispense Refill   . aspirin 81 MG tablet Take 81 mg by mouth daily.       Marland Kitchen atenolol (TENORMIN) 100 MG tablet Take 1 tablet (100 mg total) by mouth daily. 90 tablet 1   . atorvastatin (LIPITOR) 20 MG tablet Take 1 tablet (20 mg total) by mouth daily. 90 tablet 1   . B-D INS SYR ULTRAFINE 1CC/31G 31G X 5/16" 1 ML Misc      . Calcium Carbonate-Vitamin D (CALCIUM-D PO) Take 1 tablet by mouth daily.     . dilTIAZem (CARDIZEM CD) 360 MG 24 hr capsule Take 1 capsule (360 mg total) by mouth daily. 90 capsule 1   . glucose blood (ONE TOUCH ULTRA TEST) test strip Measure blood glucose 2 times a day 100 each 1   . hydroCHLOROthiazide (MICROZIDE) 12.5 MG capsule Take 1 capsule (12.5 mg total) by mouth every morning. 90 capsule 0   . insulin NPH-regular 70-30  MIXTURE (NOVOLIN 70/30) (70-30) 100 UNIT/ML injection TAKE 72 UNITS SUBCUTANEOUSLY IN THE MORNING AND 34 UNITS IN THE EVENING 30 mL 0   . Insulin Syringe-Needle U-100 (INSULIN SYRINGE 1CC/30GX5/16") 30G X 5/16" 1 ML Misc Use syringe for insulin injection 2 times a day 100 each 1   . latanoprost (XALATAN) 0.005 % ophthalmic solution Place 1 drop into both eyes nightly.        Marland Kitchen lisinopril (PRINIVIL,ZESTRIL) 40 MG tablet Take 1 tablet (40 mg total) by mouth daily. 90 tablet 1   . metFORMIN (GLUCOPHAGE) 1000 MG tablet Take 1 tablet (1,000 mg total) by mouth 2 (two) times daily with meals. 180 tablet 1   . ONETOUCH DELICA LANCETS 33G Misc Check glucose 1-2 times a day 100 each 1     No current facility-administered medications for this visit.       Review of Systems   Constitutional: Negative for fever, chills and fatigue.   HENT: Positive for sore throat. Negative for congestion and ear pain.    Respiratory: Positive for cough. Negative for chest tightness, shortness of breath and wheezing.    Cardiovascular: Negative for chest pain and palpitations.   Gastrointestinal: Negative for nausea, vomiting, abdominal pain and diarrhea.   Neurological: Negative for dizziness and  light-headedness.       Objective:     Physical Exam   Constitutional: She is oriented to person, place, and time. She appears well-developed.   oobese   HENT:   Right Ear: No tenderness. Tympanic membrane is not erythematous and not bulging.   Left Ear: No tenderness. Tympanic membrane is not erythematous and not bulging.   Nose: Mucosal edema present. Right sinus exhibits no maxillary sinus tenderness and no frontal sinus tenderness. Left sinus exhibits no maxillary sinus tenderness and no frontal sinus tenderness.   Mouth/Throat: No oropharyngeal exudate.   Neck: Neck supple.   Cardiovascular: Normal rate and regular rhythm.  Exam reveals no gallop and no friction rub.    No murmur heard.  Pulmonary/Chest: Effort normal. No respiratory distress. She has no wheezes. She has no rales.   Lymphadenopathy:     She has no cervical adenopathy.   Neurological: She is alert and oriented to person, place, and time.   Skin: She is not diaphoretic.   Vitals reviewed.       BP 118/63 mmHg  Pulse 60  Temp(Src) 98 F (36.7 C) (Oral)  Resp 18  Ht 1.626 m (5\' 4" )  Wt 88.542 kg (195 lb 3.2 oz)  BMI 33.49 kg/m2    Assessment:     1. Upper respiratory tract infection, unspecified type  - benzonatate (TESSALON PERLES) 100 MG capsule; Take 1 capsule (100 mg total) by mouth 3 (three) times daily as needed.  Dispense: 30 capsule; Refill: 0  - azithromycin (ZITHROMAX Z-PAK) 250 MG tablet; Take 2 tablets (500 mg) on  Day 1,  followed by 1 tablet (250 mg) once daily on Days 2 through 5.  Dispense: 6 tablet; Refill: 0       Plan:     Counseled patient on supportive measure for URI:  ibuprofen or acetaminophen as needed for fevers, OTC cough suppressant/decongestant as needed.     Discussed with patient that URI symptoms are most likely viral in origin, however she does not notice improvement of her symptoms over the next  3-5 days, she will start azithromycin, 500 mg a day #1 and 250 mg for 4 days thereafter.   She may also  use  Tessalon Perles,  One capsule every 8 hours as needed for cough suppression.    aalso advised her to take Zyrtec 10 mg daily.     Patient to return to clinic if no improvement in URI symptoms over the next week, sooner prn    Risk & Benefits of the new medication(s) were explained to the patient (and family) who verbalized understanding & agreed to the treatment plan. Patient (family) encouraged to contact me/clinical staff with any questions/concerns        Standley Dakins, MD

## 2015-07-03 NOTE — Progress Notes (Signed)
Have you seen any new specialists/physicians since you were last here?  no        Limb alert protocol reviewed?  Yes, no limitations

## 2015-07-20 ENCOUNTER — Encounter (INDEPENDENT_AMBULATORY_CARE_PROVIDER_SITE_OTHER): Payer: Self-pay | Admitting: Specialist

## 2015-07-20 ENCOUNTER — Ambulatory Visit (INDEPENDENT_AMBULATORY_CARE_PROVIDER_SITE_OTHER): Payer: HMO | Admitting: Specialist

## 2015-07-20 VITALS — BP 156/74 | HR 67 | Ht 66.0 in | Wt 190.8 lb

## 2015-07-20 DIAGNOSIS — Z794 Long term (current) use of insulin: Secondary | ICD-10-CM

## 2015-07-20 DIAGNOSIS — E1165 Type 2 diabetes mellitus with hyperglycemia: Secondary | ICD-10-CM

## 2015-07-20 DIAGNOSIS — IMO0002 Reserved for concepts with insufficient information to code with codable children: Secondary | ICD-10-CM

## 2015-07-20 NOTE — Patient Instructions (Signed)
You have type 2 diabetes. Your HbA1c in clinic today is 7.8% and has improved from 9.9% before.      Goal of HbA1c is < 7% to prevent complications to eye, kidney, nerve . Also reduces the risk for cardiovascular disease.     Please continue metformin as  1000 mg twice daily    Continue novolog mix 70/30 insulin as before      Due to lack of glucose data, I am not making any changes today    Please check glucose before breakfast and taking insulin and 2 hours after breakfast    Please check before dinner and taking insulin and then 2 hours after dinner      Please bring your glucose meter and follow up in clinic in a month      ]Please walk daily as tolerated upto 30 minutes daily.    Pease limit your carbohydrate in your meals.    Please attend diabetes education/diet classes at your convenience.    Please visit American Diabetes Association website for more information.    Also please wear a medical alert.    You should have eye exam once a year and foot exam once a year for diabetes related care.

## 2015-07-23 ENCOUNTER — Encounter (INDEPENDENT_AMBULATORY_CARE_PROVIDER_SITE_OTHER): Payer: Self-pay | Admitting: Specialist

## 2015-07-24 NOTE — Progress Notes (Signed)
HISTORY OF PRESENT ILLNESS:  [name] is a pleasant 60 year old who presents in our clinic for  initial visit.  She informs me she was diagnosed with type 2 diabetes in  1993.  She currently manages her type 2 diabetes by taking Novolin 70/30  mix insulin 72 units prior to breakfast and 32 units prior to dinner.  In  addition, she is on metformin 1000 mg daily.  In terms of glycemic control,  her most recent hemoglobin A1c from May of 2017, has improved to 7.8%  compared to a prior A1c of 9.9%.     In terms of glucose values, she did not bring her glucose log or meter to  the clinic today.     In terms of contributing factors to type 2 diabetes, her family history of  type 2 diabetes, sedentary lifestyle, progression of diabetes,  indiscretions to diet.     In terms of associated microvascular complications from type 2 diabetes,  her last urine microalbumin level checked in November 2016, showed a mildly  elevated microalbumin to creatinine ratio.  In terms of peripheral  neuropathy, she does complain of some paresthesias, numbness in her lower  extremities.  No complications of open ulcers, lesions, or amputations.     In terms of retinopathy, the patient tells me she has mild retinopathy.  I  do not have a recent retinopathy surveillance report.  She denies any eye  symptoms today.     In terms of associated conditions with her type 2 diabetes, the patient has  hypertension, hyperlipidemia, and follows with her primary care physician.   She has no known coronary artery disease.  She denies chest pain, shortness  of breath with exertion.     In terms of associated symptoms of hyperglycemia, she denies polyuria,  polyphagia, or polydipsia.     REVIEW OF SYSTEMS:  As mentioned above.  In addition, denies nausea, vomiting, abdominal pain,  hematuria, dysuria, chest pain, shortness of breath with exertion.  Denies  lower extremity pain or swelling.  Denies polyuria, polyphagia, or  polydipsia.  Denies  paresthesias, numbness in her lower extremities.   Denies hair or skin changes, vision changes, difficulty with ambulation or  joint pain.  Denies chest pain, shortness of breath with exertion.  Denies  any change in her bowel habits.  Denies any mood changes.  Denies any taste  changes.  Denies any weakness, difficulty with ambulation.  All other  systems reviewed and were negative.     PAST MEDICAL HISTORY:  Significant for history of type 2 diabetes, hypertension, hyperlipidemia,  obesity, diabetic retinopathy, venous insufficiency, glaucoma, breast cyst  excision, tubal ligation.     FAMILY HISTORY:  Mother with history of type 2 diabetes.  Sister and Father with a history  of type 2 diabetes and hypertension.     PERSONAL AND SOCIAL HISTORY:  Denies tobacco, alcohol, or illicit drug use.     ALLERGIES:   unknown reaction.     CURRENT MEDICATIONS:  Include aspirin 81 mg daily, atenolol 100 mg daily, Lipitor 20 mg daily,  calcium plus vitamin D, Diltiazem 360 mg daily, hydrochlorothiazide 12.5 mg  daily, Novolin 70/30 mix insulin 72 units in the morning and 34 units in  the evening, lisinopril 40 mg daily, metformin 1000 mg twice daily.     PHYSICAL EXAMINATION:  VITAL SIGNS:  Blood pressure 156/74, pulse 67 per minute regular, weight  190 pounds, height 5 feet, BMI 30.8.  GENERAL:  Pleasant,  conversant, in no acute distress.  NECK:  Supple, no lymphadenopathy.  Thyroid palpable, not enlarged.  LUNGS:  Clear to auscultation bilaterally.  No wheeze or rales.  CARDIOVASCULAR:  Regular rate and rhythm, no murmurs or gallops.  ABDOMEN:  Soft, nontender, bowel sounds were heard.  EXTREMITIES:  No edema seen.  No tremors seen.  Deep tendon reflexes 1+  at the ankle.  SKIN:  Warm and dry.  No rash seen.  PSYCHIATRIC:  Mood and affect normal.  MUSCULOSKELETAL:  Normal range of motion bilaterally.  No gait abnormality  noted.  No joint tenderness elicited.     LABORATORY DATA:       ASSESSMENT AND PLAN:  Ms. Staples is  a pleasant 60 year old who presents in our clinic for  initial visit.     1.  Type 2 diabetes.  Hemoglobin A1c is not at goal, but has improved  compared to before.     I reviewed with her goal of A1c is less than 7% to prevent microvascular  complications of neuropathy, nephropathy and retinopathy and to reduce the  risk of cardiovascular disease.     Due to lack of glucose data, I did not make any changes today; however, I  did recommend her to check her blood glucose values before and 2 hours  after each meal and bring her glucose log and follow up in a month and to  continue her current Novolin 70/30 mix insulin as before.  I think the  patient may benefit from changing her current insulin regimen to a more  flexible basal bolus insulin regimen.  We will determine this after  reviewing her glucose values in a month.     In addition, I reviewed dietary recommendations, low carbohydrate meals,  regular exercise as tolerated to improve glycemic control.     For hypoglycemia, I recommended to carry glucose tablets, wear a medical  alert, to check glucose prior to taking insulin, driving and prolonged  physical activity.       Diabetes related complications:  A. Retinopathy.  Per the patient, she has right-sided retinopathy, follows  with a retina specialist tight glycemic control was emphasized.  B. Neuropathy.  She has some symptoms of peripheral neuropathy.  Discussed  diabetic foot hygiene.  C. Nephropathy.  She is currently on ACE inhibitor therapy.  Tighter  glycemic control was emphasized.     2.  Hypertension.  Blood pressure in our clinic today is noted to be  slightly above goal.  She is on lisinopril and beta blocker.  We will  continue to follow goal of blood pressures less than 140/80.     3.  Hyperlipidemia.  Goal of LDL is less 100.  She is currently on statin  therapy.  No known coronary disease.     It was a pleasure taking care of her in our clinic today.  We will follow  up in a month as discussed  above.     Thank you very much for the consultation.

## 2015-08-05 ENCOUNTER — Encounter (INDEPENDENT_AMBULATORY_CARE_PROVIDER_SITE_OTHER): Payer: Self-pay | Admitting: Internal Medicine

## 2015-08-19 ENCOUNTER — Other Ambulatory Visit (INDEPENDENT_AMBULATORY_CARE_PROVIDER_SITE_OTHER): Payer: Self-pay | Admitting: Internal Medicine

## 2015-09-01 ENCOUNTER — Ambulatory Visit (INDEPENDENT_AMBULATORY_CARE_PROVIDER_SITE_OTHER): Payer: HMO | Admitting: Specialist

## 2015-09-02 ENCOUNTER — Inpatient Hospital Stay: Payer: HMO

## 2015-09-02 ENCOUNTER — Inpatient Hospital Stay
Admission: EM | Admit: 2015-09-02 | Discharge: 2015-09-04 | DRG: 641 | Disposition: A | Discharge status: Home / Self Care | Payer: HMO | Attending: Internal Medicine | Admitting: Internal Medicine

## 2015-09-02 ENCOUNTER — Inpatient Hospital Stay: Payer: HMO | Admitting: Internal Medicine

## 2015-09-02 ENCOUNTER — Emergency Department: Payer: HMO

## 2015-09-02 DIAGNOSIS — E785 Hyperlipidemia, unspecified: Secondary | ICD-10-CM | POA: Diagnosis present

## 2015-09-02 DIAGNOSIS — E669 Obesity, unspecified: Secondary | ICD-10-CM | POA: Diagnosis present

## 2015-09-02 DIAGNOSIS — I959 Hypotension, unspecified: Secondary | ICD-10-CM | POA: Diagnosis present

## 2015-09-02 DIAGNOSIS — E11649 Type 2 diabetes mellitus with hypoglycemia without coma: Secondary | ICD-10-CM | POA: Diagnosis not present

## 2015-09-02 DIAGNOSIS — E871 Hypo-osmolality and hyponatremia: Secondary | ICD-10-CM | POA: Diagnosis present

## 2015-09-02 DIAGNOSIS — I2510 Atherosclerotic heart disease of native coronary artery without angina pectoris: Secondary | ICD-10-CM | POA: Diagnosis present

## 2015-09-02 DIAGNOSIS — R739 Hyperglycemia, unspecified: Secondary | ICD-10-CM

## 2015-09-02 DIAGNOSIS — H409 Unspecified glaucoma: Secondary | ICD-10-CM | POA: Diagnosis present

## 2015-09-02 DIAGNOSIS — I10 Essential (primary) hypertension: Secondary | ICD-10-CM | POA: Diagnosis present

## 2015-09-02 DIAGNOSIS — I340 Nonrheumatic mitral (valve) insufficiency: Secondary | ICD-10-CM | POA: Diagnosis present

## 2015-09-02 DIAGNOSIS — Z7982 Long term (current) use of aspirin: Secondary | ICD-10-CM

## 2015-09-02 DIAGNOSIS — E11319 Type 2 diabetes mellitus with unspecified diabetic retinopathy without macular edema: Secondary | ICD-10-CM | POA: Diagnosis present

## 2015-09-02 DIAGNOSIS — R809 Proteinuria, unspecified: Secondary | ICD-10-CM | POA: Diagnosis present

## 2015-09-02 DIAGNOSIS — N179 Acute kidney failure, unspecified: Secondary | ICD-10-CM | POA: Diagnosis present

## 2015-09-02 DIAGNOSIS — Z7984 Long term (current) use of oral hypoglycemic drugs: Secondary | ICD-10-CM

## 2015-09-02 DIAGNOSIS — E1165 Type 2 diabetes mellitus with hyperglycemia: Secondary | ICD-10-CM | POA: Diagnosis present

## 2015-09-02 DIAGNOSIS — E860 Dehydration: Secondary | ICD-10-CM | POA: Diagnosis present

## 2015-09-02 DIAGNOSIS — Z6831 Body mass index (BMI) 31.0-31.9, adult: Secondary | ICD-10-CM

## 2015-09-02 DIAGNOSIS — E875 Hyperkalemia: Secondary | ICD-10-CM | POA: Diagnosis present

## 2015-09-02 DIAGNOSIS — R001 Bradycardia, unspecified: Secondary | ICD-10-CM | POA: Diagnosis present

## 2015-09-02 LAB — CHLORIDE, URINE, RANDOM: Chloride, UR: 105 mEq/L

## 2015-09-02 LAB — HEMOLYSIS INDEX
Hemolysis Index: 7 (ref 0–18)
Hemolysis Index: 11 (ref 0–18)
Hemolysis Index: 13 (ref 0–18)

## 2015-09-02 LAB — URINALYSIS, REFLEX TO MICROSCOPIC EXAM IF INDICATED
Bilirubin, UA: NEGATIVE
Blood, UA: NEGATIVE
Glucose, UA: >=1000 — AB
Ketones UA: NEGATIVE
Leukocyte Esterase, UA: NEGATIVE
Nitrite, UA: NEGATIVE
Protein, UR: NEGATIVE
Specific Gravity UA: <1.005 (ref 1.001–1.035)
Urine pH: 6 (ref 5.0–8.0)
Urobilinogen, UA: 0.2 mg/dL (ref 0.2–2.0)

## 2015-09-02 LAB — COMPREHENSIVE METABOLIC PANEL
ALT: 64 U/L — ABNORMAL HIGH (ref 0–55)
AST (SGOT): 98 U/L — ABNORMAL HIGH (ref 5–34)
Albumin/Globulin Ratio: 1 (ref 0.9–2.2)
Albumin: 3.2 g/dL — ABNORMAL LOW (ref 3.5–5.0)
Alkaline Phosphatase: 116 U/L — ABNORMAL HIGH (ref 37–106)
Anion Gap: 6 (ref 5.0–15.0)
BUN: 23 mg/dL — ABNORMAL HIGH (ref 7.0–19.0)
Bilirubin, Total: 0.5 mg/dL (ref 0.2–1.2)
CO2: 25 mEq/L (ref 22–29)
Calcium: 8.5 mg/dL (ref 8.5–10.5)
Chloride: 102 mEq/L (ref 100–111)
Creatinine: 1.6 mg/dL — ABNORMAL HIGH (ref 0.6–1.0)
Globulin: 3.3 g/dL (ref 2.0–3.6)
Glucose: 405 mg/dL — ABNORMAL HIGH (ref 70–100)
Potassium: 6.5 mEq/L (ref 3.5–5.1)
Protein, Total: 6.5 g/dL (ref 6.0–8.3)
Sodium: 133 mEq/L — ABNORMAL LOW (ref 136–145)

## 2015-09-02 LAB — BASIC METABOLIC PANEL
Anion Gap: 12 (ref 5.0–15.0)
BUN: 16 mg/dL (ref 7.0–19.0)
CO2: 22 mEq/L (ref 22–29)
Calcium: 9.2 mg/dL (ref 8.5–10.5)
Chloride: 106 mEq/L (ref 100–111)
Creatinine: 1 mg/dL (ref 0.6–1.0)
Glucose: 150 mg/dL — ABNORMAL HIGH (ref 70–100)
Potassium: 4.5 mEq/L (ref 3.5–5.1)
Sodium: 140 mEq/L (ref 136–145)

## 2015-09-02 LAB — CBC AND DIFFERENTIAL
Absolute NRBC: 0 10*3/uL
Basophils Absolute Automated: 0.03 10*3/uL (ref 0.00–0.20)
Basophils Automated: 0.3 %
Eosinophils Absolute Automated: 0.17 10*3/uL (ref 0.00–0.70)
Eosinophils Automated: 1.5 %
Hematocrit: 36.8 % — ABNORMAL LOW (ref 37.0–47.0)
Hgb: 12.3 g/dL (ref 12.0–16.0)
Immature Granulocytes Absolute: 0.06 10*3/uL — ABNORMAL HIGH
Immature Granulocytes: 0.5 %
Lymphocytes Absolute Automated: 2.05 10*3/uL (ref 0.50–4.40)
Lymphocytes Automated: 17.6 %
MCH: 33 pg — ABNORMAL HIGH (ref 28.0–32.0)
MCHC: 33.4 g/dL (ref 32.0–36.0)
MCV: 98.7 fL (ref 80.0–100.0)
MPV: 11.7 fL (ref 9.4–12.3)
Monocytes Absolute Automated: 0.93 10*3/uL (ref 0.00–1.20)
Monocytes: 8 %
Neutrophils Absolute: 8.41 10*3/uL — ABNORMAL HIGH (ref 1.80–8.10)
Neutrophils: 72.1 %
Nucleated RBC: 0 /100 WBC (ref 0.0–1.0)
Platelets: 212 10*3/uL (ref 140–400)
RBC: 3.73 10*6/uL — ABNORMAL LOW (ref 4.20–5.40)
RDW: 13 % (ref 12–15)
WBC: 11.65 10*3/uL — ABNORMAL HIGH (ref 3.50–10.80)

## 2015-09-02 LAB — MICROALBUMIN, RANDOM URINE
Urine Creatinine, Random: 28.4 mg/dL
Urine Microalbumin, Random: 18 (ref 0.0–30.0)
Urine Microalbumin/Creatinine Ratio: 63 ug/mg — ABNORMAL HIGH (ref 0–30)

## 2015-09-02 LAB — BLOOD GAS, ARTERIAL
Arterial Total CO2: 48.9 mEq/L — ABNORMAL HIGH (ref 24.0–30.0)
Base Excess, Arterial: -1.5 mEq/L (ref <=2.0)
HCO3, Arterial: 23.6 mEq/L (ref 23.0–29.0)
O2 Sat, Arterial: 96.9 % (ref 95.0–100.0)
Temperature: 37
pCO2, Arterial: 40.5 mmhg (ref 35.0–45.0)
pH, Arterial: 7.374 (ref 7.350–7.450)
pO2, Arterial: 83.4 mmhg (ref 80.0–90.0)

## 2015-09-02 LAB — POTASSIUM, URINE, RANDOM: Urine Potassium Random: 37.1 mEq/L

## 2015-09-02 LAB — ELECTROLYTES WHOLE BLOOD
Chloride, WB: 104 mEq/L (ref 98–106)
Whole Blood Glucose: 374 mg/dL — ABNORMAL HIGH (ref 70–100)
Whole Blood Potassium: 5.6 mEq/L — ABNORMAL HIGH (ref 3.5–5.3)
Whole Blood Sodium: 134 mEq/L — ABNORMAL LOW (ref 136–146)

## 2015-09-02 LAB — HEPATITIS B CORE ANTIBODY, IGM: Hepatitis B Core IgM: NONREACTIVE

## 2015-09-02 LAB — HEPATITIS B SURFACE ANTIBODY: HEPATITIS B SURFACE ANTIBODY: <8

## 2015-09-02 LAB — TROPONIN I
Troponin I: 0.03 ng/mL (ref 0.00–0.09)
Troponin I: 0.03 ng/mL (ref 0.00–0.09)

## 2015-09-02 LAB — COOXIMETRY PROFILE
Carboxyhemoglobin: 1.4 % (ref 0.0–3.0)
Hematocrit Total Calculated: 35.4 % — ABNORMAL LOW (ref 37.0–47.0)
Hemoglobin Total: 11.5 g/dL — ABNORMAL LOW (ref 12.0–16.0)
Methemoglobin: 0.4 % (ref 0.0–3.0)
O2 Content: 15.5
Oxygenated Hemoglobin: 95.2 % (ref 85.0–98.0)

## 2015-09-02 LAB — GFR
EGFR: 32.9
EGFR: 56.6

## 2015-09-02 LAB — CALCIUM, IONIZED: Calcium, Ionized: 2.15 mEq/L — ABNORMAL LOW (ref 2.30–2.58)

## 2015-09-02 LAB — GLUCOSE WHOLE BLOOD - POCT
Whole Blood Glucose POCT: 107 mg/dL — ABNORMAL HIGH (ref 70–100)
Whole Blood Glucose POCT: 207 mg/dL — ABNORMAL HIGH (ref 70–100)
Whole Blood Glucose POCT: 269 mg/dL — ABNORMAL HIGH (ref 70–100)
Whole Blood Glucose POCT: 409 mg/dL — ABNORMAL HIGH (ref 70–100)
Whole Blood Glucose POCT: 63 mg/dL — ABNORMAL LOW (ref 70–100)
Whole Blood Glucose POCT: 93 mg/dL (ref 70–100)

## 2015-09-02 LAB — LACTIC ACID: Lactic Acid: 2 mmol/L (ref 0.2–2.0)

## 2015-09-02 LAB — SODIUM, URINE, RANDOM: Urine Sodium Random: 87 mEq/L

## 2015-09-02 LAB — HEPATITIS A ANTIBODY, IGM: Hep A IgM: NONREACTIVE

## 2015-09-02 LAB — HEPATITIS C ANTIBODY: Hepatitis C, AB: NONREACTIVE

## 2015-09-02 LAB — HEPATITIS B SURFACE ANTIGEN W/ REFLEX TO CONFIRMATION: Hepatitis B Surface Antigen: NONREACTIVE

## 2015-09-02 MED ORDER — SODIUM CHLORIDE 0.9 % IV BOLUS
1000.0000 mL | Freq: Once | INTRAVENOUS | Status: AC
Start: 2015-09-02 — End: 2015-09-02
  Administered 2015-09-02: 1000 mL via INTRAVENOUS

## 2015-09-02 MED ORDER — ATORVASTATIN CALCIUM 20 MG PO TABS
20.0000 mg | ORAL_TABLET | Freq: Every day | ORAL | Status: DC
Start: 2015-09-02 — End: 2015-09-04
  Administered 2015-09-03 – 2015-09-04 (×2): 20 mg via ORAL
  Filled 2015-09-02 (×2): qty 1

## 2015-09-02 MED ORDER — BENZONATATE 100 MG PO CAPS
100.0000 mg | ORAL_CAPSULE | Freq: Three times a day (TID) | ORAL | Status: DC | PRN
Start: 2015-09-02 — End: 2015-09-04

## 2015-09-02 MED ORDER — SODIUM CHLORIDE 0.9 % IV SOLN
1.0000 g | Freq: Once | INTRAVENOUS | Status: DC
Start: 2015-09-02 — End: 2015-09-02
  Filled 2015-09-02: qty 10

## 2015-09-02 MED ORDER — BISACODYL 10 MG RE SUPP
10.0000 mg | Freq: Every day | RECTAL | Status: DC | PRN
Start: 2015-09-02 — End: 2015-09-04

## 2015-09-02 MED ORDER — ONDANSETRON HCL 4 MG/2ML IJ SOLN
4.0000 mg | Freq: Four times a day (QID) | INTRAMUSCULAR | Status: DC | PRN
Start: 2015-09-02 — End: 2015-09-04

## 2015-09-02 MED ORDER — HEPARIN SODIUM (PORCINE) 5000 UNIT/ML IJ SOLN
5000.0000 [IU] | Freq: Two times a day (BID) | INTRAMUSCULAR | Status: DC
Start: 2015-09-02 — End: 2015-09-04
  Administered 2015-09-02 – 2015-09-04 (×4): 5000 [IU] via SUBCUTANEOUS
  Filled 2015-09-02 (×5): qty 1

## 2015-09-02 MED ORDER — LATANOPROST 0.005 % OP SOLN
1.0000 [drp] | Freq: Every evening | OPHTHALMIC | Status: DC
Start: 2015-09-02 — End: 2015-09-04
  Administered 2015-09-02 – 2015-09-03 (×2): 1 [drp] via OPHTHALMIC
  Filled 2015-09-02: qty 1

## 2015-09-02 MED ORDER — CALCIUM GLUCONATE 10 % IV SOLN
1.0000 g | Freq: Once | INTRAVENOUS | Status: DC
Start: 2015-09-02 — End: 2015-09-02

## 2015-09-02 MED ORDER — SODIUM CHLORIDE 0.9 % IV SOLN
100.0000 mL/h | INTRAVENOUS | Status: DC
Start: 2015-09-02 — End: 2015-09-02
  Administered 2015-09-02: 100 mL/h via INTRAVENOUS

## 2015-09-02 MED ORDER — SODIUM CHLORIDE 0.9 % IV SOLN
1.0000 g | Freq: Once | INTRAVENOUS | Status: AC
Start: 2015-09-02 — End: 2015-09-02
  Administered 2015-09-02: 1 g via INTRAVENOUS
  Filled 2015-09-02: qty 10

## 2015-09-02 MED ORDER — NITROGLYCERIN 2 % TD OINT
2.0000 [in_us] | TOPICAL_OINTMENT | Freq: Four times a day (QID) | TRANSDERMAL | Status: DC | PRN
Start: 2015-09-02 — End: 2015-09-04
  Administered 2015-09-03: 2 [in_us] via TOPICAL
  Filled 2015-09-02: qty 1

## 2015-09-02 MED ORDER — INSULIN GLARGINE 100 UNIT/ML SC SOLN
40.0000 [IU] | Freq: Every evening | SUBCUTANEOUS | Status: DC
Start: 2015-09-02 — End: 2015-09-02

## 2015-09-02 MED ORDER — ATROPINE SULFATE 0.1 MG/ML IJ SOLN (WRAP)
0.5000 mg | Freq: Once | INTRAMUSCULAR | Status: AC
Start: 2015-09-02 — End: 2015-09-02
  Administered 2015-09-02: 0.5 mg via INTRAVENOUS
  Filled 2015-09-02: qty 10

## 2015-09-02 MED ORDER — ASPIRIN 81 MG PO CHEW
81.0000 mg | CHEWABLE_TABLET | Freq: Every day | ORAL | Status: DC
Start: 2015-09-02 — End: 2015-09-04
  Administered 2015-09-03 – 2015-09-04 (×2): 81 mg via ORAL
  Filled 2015-09-02 (×2): qty 1

## 2015-09-02 MED ORDER — INSULIN NPH ISOPHANE & REGULAR (70-30) 100 UNIT/ML SC SUSP
72.0000 [IU] | Freq: Every morning | SUBCUTANEOUS | Status: DC
Start: 2015-09-03 — End: 2015-09-03
  Filled 2015-09-02: qty 0.72

## 2015-09-02 MED ORDER — ONDANSETRON 4 MG PO TBDP
4.0000 mg | ORAL_TABLET | Freq: Four times a day (QID) | ORAL | Status: DC | PRN
Start: 2015-09-02 — End: 2015-09-04

## 2015-09-02 MED ORDER — GLUCAGON 1 MG IJ SOLR (WRAP)
1.0000 mg | INTRAMUSCULAR | Status: DC | PRN
Start: 2015-09-02 — End: 2015-09-04

## 2015-09-02 MED ORDER — INSULIN NPH ISOPHANE & REGULAR (70-30) 100 UNIT/ML SC SUSP
34.0000 [IU] | Freq: Every day | SUBCUTANEOUS | Status: DC
Start: 2015-09-02 — End: 2015-09-03
  Administered 2015-09-02: 34 [IU] via SUBCUTANEOUS
  Filled 2015-09-02: qty 0.34

## 2015-09-02 MED ORDER — INSULIN LISPRO 100 UNIT/ML SC SOLN
1.0000 [IU] | Freq: Every evening | SUBCUTANEOUS | Status: DC | PRN
Start: 2015-09-02 — End: 2015-09-04
  Administered 2015-09-03: 2 [IU] via SUBCUTANEOUS
  Filled 2015-09-02: qty 6

## 2015-09-02 MED ORDER — DEXTROSE 10 % IV BOLUS
125.0000 mL | INTRAVENOUS | Status: DC | PRN
Start: 2015-09-02 — End: 2015-09-04

## 2015-09-02 MED ORDER — ONDANSETRON HCL 4 MG/2ML IJ SOLN
4.0000 mg | Freq: Once | INTRAMUSCULAR | Status: AC
Start: 2015-09-02 — End: 2015-09-02
  Administered 2015-09-02: 4 mg via INTRAVENOUS
  Filled 2015-09-02: qty 2

## 2015-09-02 MED ORDER — TRAZODONE HCL 50 MG PO TABS
50.0000 mg | ORAL_TABLET | Freq: Every evening | ORAL | Status: DC | PRN
Start: 2015-09-02 — End: 2015-09-04

## 2015-09-02 MED ORDER — INSULIN REGULAR HUMAN 100 UNIT/ML IJ SOLN
6.0000 [IU] | Freq: Once | INTRAMUSCULAR | Status: AC
Start: 2015-09-02 — End: 2015-09-02
  Administered 2015-09-02: 6 [IU] via INTRAVENOUS
  Filled 2015-09-02: qty 18

## 2015-09-02 MED ORDER — ACETAMINOPHEN 325 MG PO TABS
650.0000 mg | ORAL_TABLET | ORAL | Status: DC | PRN
Start: 2015-09-02 — End: 2015-09-04
  Filled 2015-09-02: qty 2

## 2015-09-02 MED ORDER — SODIUM CHLORIDE 0.9 % IV SOLN
INTRAVENOUS | Status: DC
Start: 2015-09-02 — End: 2015-09-03
  Administered 2015-09-02: 150 mL/h via INTRAVENOUS

## 2015-09-02 MED ORDER — INSULIN LISPRO 100 UNIT/ML SC SOLN
2.0000 [IU] | Freq: Three times a day (TID) | SUBCUTANEOUS | Status: DC | PRN
Start: 2015-09-02 — End: 2015-09-04
  Administered 2015-09-03: 2 [IU] via SUBCUTANEOUS
  Filled 2015-09-02: qty 6

## 2015-09-02 MED ORDER — ACETAMINOPHEN 325 MG PO TABS
650.0000 mg | ORAL_TABLET | Freq: Four times a day (QID) | ORAL | Status: DC | PRN
Start: 2015-09-02 — End: 2015-09-04
  Administered 2015-09-04 (×2): 650 mg via ORAL
  Filled 2015-09-02: qty 2

## 2015-09-02 MED ORDER — SODIUM POLYSTYRENE SULFONATE 15 GM/60ML PO SUSP
30.0000 g | Freq: Once | ORAL | Status: AC
Start: 2015-09-02 — End: 2015-09-02
  Administered 2015-09-02: 30 g via ORAL
  Filled 2015-09-02: qty 120

## 2015-09-02 MED ORDER — NALOXONE HCL 0.4 MG/ML IJ SOLN (WRAP)
0.2000 mg | INTRAMUSCULAR | Status: DC | PRN
Start: 2015-09-02 — End: 2015-09-04

## 2015-09-02 MED ORDER — GLUCOSE 40 % PO GEL
15.0000 g | ORAL | Status: DC | PRN
Start: 2015-09-02 — End: 2015-09-04

## 2015-09-02 MED ORDER — GLUCAGON 1 MG IJ SOLR (WRAP)
2.0000 mg | Freq: Once | INTRAMUSCULAR | Status: AC
Start: 2015-09-02 — End: 2015-09-02
  Administered 2015-09-02: 2 mg via INTRAVENOUS
  Filled 2015-09-02: qty 2

## 2015-09-02 MED ORDER — MORPHINE SULFATE 2 MG/ML IJ/IV SOLN (WRAP)
2.0000 mg | Status: DC | PRN
Start: 2015-09-02 — End: 2015-09-04

## 2015-09-02 MED ORDER — ACETAMINOPHEN 650 MG RE SUPP
650.0000 mg | RECTAL | Status: DC | PRN
Start: 2015-09-02 — End: 2015-09-04

## 2015-09-02 NOTE — H&P (Signed)
Patient Type: I     ATTENDING PHYSICIAN: French Ana, MD     CHIEF COMPLAINT:  Dizziness and lightheadedness.     HISTORY OF PRESENT ILLNESS:  This is a 60 year old female with a past medical history of diabetes  mellitus type 2, diabetic retinopathy, hypertension, hyperlipidemia,  obesity, peripheral vascular disease, glaucoma, who presented to the  emergency room with a chief complaint of dizziness and lightheadedness.  It  all started since waking up this morning.  EMS was called and her EKG  revealed junctional bradycardia with heart rate in the 30s to 40s.  She got  2 mg of atropine en route.  In the emergency room, she was found to have  significant hyperkalemia of 6.5, and she received dextrose and insulin and  calcium gluconate to correct her potassium.  Her heart rate has improved up  to 50s.  She denies any chest pain or shortness of breath.  She is  currently getting admitted for further management.     PAST MEDICAL HISTORY:  Glaucoma, diabetes mellitus type 2, diabetic retinopathy, hypertension,  hyperlipidemia, obesity, peripheral vascular disease.     PAST SURGICAL HISTORY:  Tubal ligation and breast cyst removal.     ALLERGIES:  SHELLFISH.     HOME MEDICATIONS:  Aspirin 81 mg orally daily, Tenormin 100 mg orally daily, Lipitor 20 mg  orally daily, Cardizem-CD 360 mg orally daily, hydrochlorothiazide 12.5 mg  orally daily, Xalatan 0.005% eyedrops 1 drop both eyes at night, lisinopril  40 mg orally daily, metformin 1000 mg orally 2 times daily, NPH 70/30 72 in  the morning and 34 in the evening.     SOCIAL HISTORY:  The patient is not a smoker, non-alcoholic, no drug abuse.     FAMILY HISTORY:  No family history of any cancer.     REVIEW OF SYSTEMS:  Lightheadedness, dizziness, some nausea.  All other systems have been  reviewed and are negative unless otherwise specified.     PHYSICAL EXAMINATION:  VITAL SIGNS:  Temperature 97.7.  Initial pulse was 38, but currently she is  at 58 beats per minute,  respiratory rate 19 per minute, blood pressure  146/69, saturating at 98% on room air.  HEENT:  Pupils equal and round, reactive to light.  Oropharynx normal.   Tongue moist.  NECK:  Supple, no JVD, no carotid bruit.  No thyromegaly, no cervical  lymphadenopathy.  HEART:  S1, S2 heard, regular in rate and rhythm.  CHEST:  Clear to auscultation bilaterally.  ABDOMEN:  Soft, nontender, nondistended, positive bowel sounds.  EXTREMITIES:  No cyanosis, no clubbing, no edema.  NEUROLOGIC:  No focal neurological deficit.  SKIN:  No rashes.  PSYCHIATRIC:  Alert and oriented x3.     INITIAL EKG:  Junctional bradycardia at 40 beats per minute, peaked T-waves in anterior  leads.  Subsequent EKG after the calcium gluconate and dextrose and insulin  administration showed sinus rhythm at 59 beats per minute, first-degree AV  block.  T-waves are now visualized.     LABORATORY AND DIAGNOSTIC DATA:  WBC 11.6, hemoglobin 12.3, WBC 11.6, hemoglobin 12.3, hematocrit 36.8,  platelets 212.  Glucose 405, BUN 23, creatinine 1.6, sodium 133, potassium  6.5, chloride 104, bicarbonate 25.  GFR 32.9, calcium 8.5.  AST elevated at  98.  ALT elevated at 64.  Alkaline phosphatase elevated at 116.  Lactic  acid 2.0.  Troponin 0.03.  ABG unremarkable.  Chest x-ray:  No acute  cardiopulmonary disease.  ASSESSMENT AND PLAN:  1.  This 60 year old female with a past medical history of multiple medical  problems as described above, presented with hyperkalemia, causing  junctional bradycardia, acute kidney injury, and significant hyperglycemia.  2.  This patient has hyperkalemia.  She has received calcium gluconate and  dextrose, insulin in the emergency room.  She will be monitored on  telemetry.  We will give her Kayexalate.  We will involve nephrology.   Given her hyperkalemia, we will hold her lisinopril for now.  Because she  presented with acute kidney injury, we will hold her hydrochlorothiazide as  well.  We will aggressively hydrate the  patient and will monitor her BUN  and creatinine and GFR.  Because of her elevated creatinine, we will also  keep her metformin on hold.  3.  She presented with a junctional bradycardia.  Her heart rate is in the  high 50s.  She does not require a transcutaneous pacer at the bedside at  the moment as I believe that her junctional bradycardia is likely due to  hyperkalemia.  We will keep her atenolol and Cardizem on hold.  We will  involve cardiology.  We will check her cardiac enzymes and an  echocardiogram will be ordered.  4.  She has hyperglycemia.  We will hydrate her generously, ensuring that  she is not in hyperosmolar state.  She will continue her NPH 70/30 insulin  as per home regimen.  We will check her hemoglobin A1c in the morning.  She  will be on sliding scale.  She will be on diabetic and cardiac diet.  We  will continue her aspirin.  5.  For her essential hypertension, her atenolol and Cardizem along with  hydrochlorothiazide are on hold.  We will keep her on p.r.n. nitroglycerin  paste for now.    6.  For her hyperlipidemia, we will continue Lipitor.  We will check her  fasting lipids in the morning.  7.  For her glaucoma, will continue Xalatan eyedrops.     She wishes to be full code.  She will be on diabetic and cardiac diet.  She  will be on both pharmacological and mechanical VT prophylaxis.  We will  repeat her potassium levels in the next couple of hours.  Further  management of this patient would be as per nephrology and cardiology.           D:  09/02/2015 13:17 PM by Dr. [place] ([id number])  T:  09/02/2015 13:58 PM by NTS      (Conf: [id number]) (Doc ID: [id number])

## 2015-09-02 NOTE — Progress Notes (Signed)
Pt is seen and examined at bedside.  Feeling better.  Acute kidney injury with hyperkalemia and hyperglycemia.  Medical management.  Started on IV fluids.  Check urine studies.  Renal US.  Discussed with the family and the primary team  Full consult to follow.

## 2015-09-02 NOTE — Plan of Care (Signed)
Problem: Moderate/High Fall Risk Score >5  Goal: Patient will remain free of falls  Outcome: Progressing  Fall risks and interventions explained and understood    Problem: Hemodynamic Status: Cardiac  Goal: Stable vital signs and fluid balance  Outcome: Progressing    Problem: Fluid and Electrolyte Imbalance/ Endocrine  Goal: Fluid and electrolyte balance are achieved/maintained  Outcome: Progressing  Goal: Adequate hydration  Outcome: Progressing    Problem: Nutrition  Goal: Nutritional intake is adequate  Outcome: Progressing    Problem: Diabetes: Glucose Imbalance  Goal: Blood glucose stable at established goal  Outcome: Progressing

## 2015-09-02 NOTE — Consults (Signed)
CONSULTATION    Date Time: 09/02/2015 3:29 PM  Patient Name: [name]  Requesting Physician: Adonis Brook, MD      Reason for Consultation:   Acute kidney injury  Hyperkalemia,  Assessment:   Acute kidney injury, likely volume related. U/A with hyaline cast.   Hyperkalemia, likely related to above in the setting of acute kidney injury/hyperglycemia and lisinopril use.  Hyponatremia, translocational due to hyperglycemia,  Possible albuminuria per history.  Hypertension, was hypotensive on presentation,    Bradycardia with junctional rhythm, cardiology is following.  Diabetes,   Coronary artery disease,  Plan:   Medical management for hyperkalemia.  Repeat potassium later today.  Started on IV fluids.  Follow U/A and lytes.  Renal US once stable.  Agree with holding lisinopril/HCTZ, metformin.  Monitor the renal function, electrolytes and fluid status.  Meds in renal dose and avoid nephrotoxic.  Monitor urine output. Bladder scan on the floor to rule out any retention.  Discussed with the daughter at bedside in detail whos also helped in the history.  Discussed with the primary team.  History:   [name] is a 60 y.o. female who presents to the hospital on 09/02/2015 with dizziness and lightheadedness. She was found to have bradycarida with junctional rhythm. The labs revealed hyperglycemia, hyperkalemia and acute kidney injury. Pt has diabetes and hypertension. She also admits mild diabetic retinopathy but report not available. She admits having some protein in the urine but unable to specify further. She denies any other knowledge of kidney disease, history of kidney stone, use of NSAIDs. She denies any family history of kidney disease.     Past Medical History:     Past Medical History   Diagnosis Date   . Glaucoma 06/01/2012   . Diabetes mellitus type 2 in obese    . Hypertension    . Hyperlipidemia    . Obesity    . Diabetic retinopathy 06/28/2013     Of the right eye, per patient   . Venous  insufficiency 09/17/2014     Followed at [facility].  Wears compression stockings.        Past Surgical History:     Past Surgical History   Procedure Laterality Date   . Breast cyst excision  1993     bilateral   . Tubal ligation         Family History:     Family History   Problem Relation Age of Onset   . Heart disease Mother    . Diabetes Mother    . Hypertension Mother    . Hyperlipidemia Mother    . Heart disease Father    . Diabetes Father    . Hypertension Father    . Hyperlipidemia Father    . Cirrhosis Father    . Liver cancer Maternal Aunt    . Cancer Maternal Uncle      liver   . Diabetes Sister        Social History:     Social History     Social History   . Marital Status: Single     Spouse Name: N/A   . Number of Children: 3   . Years of Education: N/A     Occupational History   . Maintenance      Social History Main Topics   . Smoking status: Never Smoker    . Smokeless tobacco: Never Used   . Alcohol Use: No   . Drug  Use: No   . Sexual Activity: Not Currently     Other Topics Concern   . Not on file     Social History Narrative    Originally from [place].  Lives with her daughter.    Exercise: walks frequently       Allergies:     Allergies   Allergen Reactions   . Shellfish-Derived Products        Medications:     Current Facility-Administered Medications   Medication Dose Route Frequency   . aspirin  81 mg Oral Daily   . atorvastatin  20 mg Oral Daily   . heparin (porcine)  5,000 Units Subcutaneous Q12H [facility]   . insulin NPH-regular 70-30 MIXTURE  34 Units Subcutaneous Daily before dinner   . [START ON 09/03/2015] insulin NPH-regular 70-30 MIXTURE  72 Units Subcutaneous QAM AC   . latanoprost  1 drop Both Eyes QHS       Review of Systems:   No fever, chills, but has been feeling dizzy.  No cough, sputum  No chest pain or palpitation  No abd pain, nausea or vomiting  No urinary symptoms, good urine volume  No joint symptoms  No skin rash  No headache, visual changes,   All other  systems reviewed and negative for new problems  Physical Exam:   BP 146/69 mmHg  Pulse 57  Temp(Src) 97.9 F (36.6 C) (Oral)  Resp 18  Ht 1.676 m (5\' 6" )  Wt 90.583 kg (199 lb 11.2 oz)  BMI 32.25 kg/m2  SpO2 97%    Intake/Output Summary (Last 24 hours) at 09/02/15 1529  Last data filed at 09/02/15 1500   Gross per 24 hour   Intake 256.67 ml   Output    775 ml   Net -518.33 ml       General appearance - awake, in no distress.  Skin turgor fair  Neck - JVP not raised  Chest - B/L air entry.  Heart - S1, S2, .  Abdomen - soft, tender, distended,   Has leg edema,    Labs Reviewed:       Recent Labs      09/02/15   1129   WBC  11.65*   HGB  12.3   HEMATOCRIT  36.8*   PLATELETS  212       Recent Labs      09/02/15   1129   SODIUM  133*   POTASSIUM  6.5*   CHLORIDE  102   CO2  25   BUN  23.0*   CREATININE  1.6*   GLUCOSE  405*   CALCIUM  8.5       Recent Labs      09/02/15   1129   AST (SGOT)  98*   ALT  64*   ALKALINE PHOSPHATASE  116*   PROTEIN, TOTAL  6.5   ALBUMIN  3.2*       No results for input(s): PTT, PT, INR in the last 72 hours.    Rads:     Radiology Results (24 Hour)     Procedure Component Value Units Date/Time    Chest AP Portable [[id number]] Collected:  09/02/15 1234    Order Status:  Completed Updated:  09/02/15 1239    Narrative:      History: chest pain    Technique: Single frontal radiograph of the chest    Comparison: Radiograph 02/21/2014    Findings:  There is increased lung markings bilaterally. No  evidence of infiltrate  or effusion  There is no pneumothorax.  There is moderate cardiomegaly.     Atherosclerotic calcifications are  seen in the aorta.     Degenerative shoulder and spine      Impression:       Chronic lung changes with no acute  cardiopulmonary disease.    Gaynelle Arabian, MD   09/02/2015 12:35 PM            Mosie Lukes, MD  09/02/2015  3:29 PM  [phone]

## 2015-09-02 NOTE — ED Notes (Signed)
Bed: CRB  Expected date: 09/02/15  Expected time: 11:02 AM  Means of arrival: [employer] 301  Comments:  [facility]

## 2015-09-02 NOTE — Progress Notes (Signed)
Received patient from the ED alert, oriented, ambulatory, conversant and in no pain or distress.     2g Calcium gluconate reportedly given in ED. Patient now sinus brady in mid to high 50s.     Normal Saline adjusted to 167ml/hr.     Admission done with help of interpreter.     Accompanied to abdominal ultrasound.     Patient reports taking aspirin and atorvastatin this morning along with her other daily meds.     Encouraged patient to have a bowel movement when she has the urge, report any signs or symptoms involving pain, distress, nausea, change in mentation, palpitations, diaphoresis, or sob. Patient states understanding.

## 2015-09-02 NOTE — ED Notes (Signed)
[name] 60 yr old female with chief complaint of bradycardia, nausea, and dizziness. EMS reports patient's initial pulse was in the 30s. 2mg  of Atropin administered. Blood sugar 415. BP 80/60. Patient arrives awake and alert. Pulse 44  Temp(Src) 97.5 F (36.4 C) (Oral)  Resp 20  SpO2 98%

## 2015-09-02 NOTE — Progress Notes (Signed)
CONSULTATION    [facility]  [phone]    Date Time: 09/02/2015 1:42 PM  Patient Name: [name]  Requesting Physician: French Ana, MD      Reason for Consultation:   bradycardia    Assessment:   Symptomatic junctional bradycardia   - due to calcium channel blocker, b blocker   - resolved   - now SR with 1 AVB  Hyperkalemia ( K 6.5)  Acute renal failure ( SCr 1.6)  Coronary artery disease   - cardiac cath (2002) - LCx 30% stenosis   - stress test (01/2015) - normal; no ischemia or infarct   - mild, stable, no angina  Normal LV systolic function (01/2015)  Mild mitral regurgitation  HTN  DM    Patient Active Problem List   Diagnosis   . Glaucoma   . Diabetes mellitus type 2 in obese   . Hypertension   . Hyperlipidemia   . Obesity   . Elevated liver enzymes   . Fatty liver   . Diabetic retinopathy   . Non-rheumatic mitral regurgitation   . Venous insufficiency   . Cystocele   . Hyperkalemia       Plan:   Will hold off on all AV nodal blocking agents and observe on telemetry  Check echocardiogram  Serial cardiac enzymes  Will follow blood pressure and treat accordingly      History:   [name] is a 60 y.o. female who presents to the hospital on 09/02/2015 with history of  diabetes, hypertension, hyperlipidemia and coronary artery disease complaining of  fatigue and dizziness, lightheadedness     60 year old female with history of mild coronary artery disease, hypertension, diabetes, dyslipidemia presents with symptomatic junctional bradycardia.  Since this morning she was feeling dizziness, lightheadedness and increased lethargy.  EMS activated, it was noted her heart rate was 30.  Telemetry strips revealed junctional bradycardia with a heart rate of 30.  She was given atropine.  She was brought to the ER.  She was given calcium gluconate.  Her heart rate improved to 60s and in sinus rhythm.    On admission her potassium was 6.5.  She was also on Cardizem 360 mg and atenolol 100 mg daily.   Recent medication change was approximately 3 months ago and at that time her Cardizem dose was increased to 360 mg.    She denies chest pain, pressure, heaviness, tightness, shortness of breath, edema, or syncope.    Risk Factors for CAD:  known cardiac disease, Diabetes Mellitus, hypertension, hypercholesterolemia/hyperlipidemia       History of obtained per patient's family member who is at bedside.  Patient does not speak [place]            Past Medical History:     Past Medical History   Diagnosis Date   . Glaucoma 06/01/2012   . Diabetes mellitus type 2 in obese    . Hypertension    . Hyperlipidemia    . Obesity    . Diabetic retinopathy 06/28/2013     Of the right eye, per patient   . Venous insufficiency 09/17/2014     Followed at [facility].  Wears compression stockings.        Past Surgical History:     Past Surgical History   Procedure Laterality Date   . Breast cyst excision  1993     bilateral   . Tubal ligation         Family History:  Family History   Problem Relation Age of Onset   . Heart disease Mother    . Diabetes Mother    . Hypertension Mother    . Hyperlipidemia Mother    . Heart disease Father    . Diabetes Father    . Hypertension Father    . Hyperlipidemia Father    . Cirrhosis Father    . Liver cancer Maternal Aunt    . Cancer Maternal Uncle      liver   . Diabetes Sister        Social History:     Social History     Social History   . Marital Status: Single     Spouse Name: N/A   . Number of Children: 3   . Years of Education: N/A     Occupational History   . Maintenance      Social History Main Topics   . Smoking status: Never Smoker    . Smokeless tobacco: Never Used   . Alcohol Use: No   . Drug Use: No   . Sexual Activity: Not Currently     Other Topics Concern   . Not on file     Social History Narrative    Originally from [place].  Lives with her daughter.    Exercise: walks frequently       Allergies:     Allergies   Allergen Reactions   . Shellfish-Derived  Products        Medications:     Current Facility-Administered Medications   Medication Dose Route Frequency   . insulin NPH-regular 70-30 MIXTURE  34 Units Subcutaneous Daily before dinner   . [START ON 09/03/2015] insulin NPH-regular 70-30 MIXTURE  72 Units Subcutaneous QAM AC   . sodium polystyrene  30 g Oral Once     . sodium chloride 100 mL/hr (09/02/15 1226)       Review of Systems:     Positive for nausea.  Positive for dizziness, lightheadedness.  Negative for syncope.  Negative for fevers, chills, sweats.  Negative for edema.    Negative chest pain, dyspnea,    Other review of system as per HPI, otherwas negative       Physical Exam:     Filed Vitals:    09/02/15 1316   BP: 155/74   Pulse: 60   Temp:    Resp: 18   SpO2: 98%        Intake and Output Summary (Last 24 hours) at Date Time  No intake or output data in the 24 hours ending 09/02/15 1342    General appearance - alert, well appearing, and in no distress  Mental status - alert, oriented to person, place, and time  Chest - clear to auscultation, no wheezes, rales or rhonchi, symmetric air entry  Heart - normal rate, regular rhythm, normal S1, S2, no murmurs, rubs, clicks or gallops  Abdomen - soft, nontender, nondistended, no masses or organomegaly  Extremities - peripheral pulses normal, no pedal edema, no clubbing or cyanosis  Skin - normal coloration and turgor, no rashes, no suspicious skin lesions noted    Labs Reviewed:     Results     Procedure Component Value Units Date/Time    Glucose Whole Blood - POCT [[id number]]  (Abnormal) Collected:  09/02/15 1319     POCT - Glucose Whole blood 269 (H) mg/dL Updated:  47/82/95 6213    GFR [[id number]] Collected:  09/02/15 1129     EGFR  32.9 Updated:  09/02/15 1216    Comprehensive metabolic panel [[id number]]  (Abnormal) Collected:  09/02/15 1129    Specimen Information:  Blood Updated:  09/02/15 1215     Glucose 405 (H) mg/dL      BUN 21.3 (H) mg/dL      Creatinine 1.6 (H) mg/dL      Sodium 086 (L) mEq/L       Potassium 6.5 (HH) mEq/L      Chloride 102 mEq/L      CO2 25 mEq/L      Calcium 8.5 mg/dL      Protein, Total 6.5 g/dL      Albumin 3.2 (L) g/dL      AST (SGOT) 98 (H) U/L      ALT 64 (H) U/L      Alkaline Phosphatase 116 (H) U/L      Bilirubin, Total 0.5 mg/dL      Globulin 3.3 g/dL      Albumin/Globulin Ratio 1.0      Anion Gap 6.0     Troponin I [[id number]] Collected:  09/02/15 1129    Specimen Information:  Blood Updated:  09/02/15 1215     Troponin I 0.03 ng/mL     Hemolysis index [[id number]] Collected:  09/02/15 1129     Hemolysis Index 7 Updated:  09/02/15 1215    Arterial Blood Gas (ABG) [[id number]]  (Abnormal) Collected:  09/02/15 1129    Specimen Information:  Blood, Arterial Updated:  09/02/15 1149     pH, Arterial 7.374      pCO2, Arterial 40.5 mmhg      pO2, Arterial 83.4 mmhg      HCO3, Arterial 23.6 mEq/L      Arterial Total CO2 48.9 (H) mEq/L      Base Excess, Arterial -1.5 mEq/L      O2 Sat, Arterial 96.9 %      ABG CollectionSite Right Radl      Allen's Test Yes      Temperature 37.0      O2 Delivery Room Air     ELECTROLYTES WHOLE BLOOD [[id number]]  (Abnormal) Collected:  09/02/15 1129     Sodium Whole Blood 134 (L) mEq/L Updated:  09/02/15 1149     Potassium Whole Blood 5.6 (H) mEq/L      Chloride, WB 104 mEq/L      Glucose Whole Blood 374 (H) mg/dL     Calcium, ionized [[id number]]  (Abnormal) Collected:  09/02/15 1129     Calcium, Ionized 2.15 (L) mEq/L Updated:  09/02/15 1149    Lactic acid, plasma [[id number]] Collected:  09/02/15 1140     Lactic acid 2.0 mmol/L Updated:  09/02/15 1149    Co-Oximetry Profile [[id number]]  (Abnormal) Collected:  09/02/15 1129     Total Hemoglobin 11.5 (L) g/dL Updated:  60/10/93 2355     Carboxyhemoglobin 1.4 %      Oxygenated Hemoglobin 95.2 %      Methemoglobin 0.4 %      O2 Content 15.5      Total Hematocrit Calculated 35.4 (L) %     CBC with differential [[id number]]  (Abnormal) Collected:  09/02/15 1129    Specimen Information:  Blood from Blood Updated:   09/02/15 1144     WBC 11.65 (H) x10 3/uL      Hgb 12.3 g/dL      Hematocrit 70.6 (L) %      Platelets 212 x10 3/uL      RBC 3.73 (L)  x10 6/uL      MCV 98.7 fL      MCH 33.0 (H) pg      MCHC 33.4 g/dL      RDW 13 %      MPV 11.7 fL      Neutrophils 72.1 %      Lymphocytes Automated 17.6 %      Monocytes 8.0 %      Eosinophils Automated 1.5 %      Basophils Automated 0.3 %      Immature Granulocyte 0.5 %      Nucleated RBC 0.0 /100 WBC      Neutrophils Absolute 8.41 (H) x10 3/uL      Abs Lymph Automated 2.05 x10 3/uL      Abs Mono Automated 0.93 x10 3/uL      Abs Eos Automated 0.17 x10 3/uL      Absolute Baso Automated 0.03 x10 3/uL      Absolute Immature Granulocyte 0.06 (H) x10 3/uL      Absolute NRBC 0.00 x10 3/uL     Glucose Whole Blood - POCT [[id number]]  (Abnormal) Collected:  09/02/15 1127     POCT - Glucose Whole blood 409 (H) mg/dL Updated:  40/98/11 9147          Rads:   EKG -  Junctional bradycardia in 30s  EKG - NSR, 1 AVB  cxr - chronic lung changes     Signed by: Darnelle Maffucci

## 2015-09-02 NOTE — ED Provider Notes (Signed)
EMERGENCY DEPARTMENT HISTORY AND PHYSICAL EXAM     Physician/Midlevel provider first contact with patient: 09/02/15 1114         Date: 09/02/2015  Patient Name: [name]    History of Presenting Illness     Chief Complaint   Patient presents with   . Bradycardia   . Dizziness   . Nausea       History Provided By: EMS report, patient via interpreter    Chief Complaint: lightheadedness  Onset: 4 hours ago  Timing: constant  Location: neurological  Quality: generalized  Severity: moderate  Exacerbating factors: none  Alleviating factors: none  Associated Symptoms: neck stiffness, lower back pain, nausea  Pertinent Negatives: chest pain, abdominal pain, vomiting    Additional History: [name] is a 60 y.o. female presenting to the ED with lightheadedness for the past 4 hours since waking up this morning. Patient was on her way to work when she became weak with nausea and "could not walk". Patient was given a total of 2mg  Atropine en route. Her rate was in the 30-40's range. Patient is on multiple blood pressure medications including Lisinopril. Patient is compliant with medication regimen. EKG in the field revealed junctional rhythm per EMS. She is c/o neck tightness and mid-lower back pain over the past week.      PCP: Castillo-Catoni, Maudry Mayhew, MD  SPECIALISTS:  Cardiology: Dr. Fransisco Beau    Current Facility-Administered Medications   Medication Dose Route Frequency Provider Last Rate Last Dose   . 0.9%  NaCl infusion   Intravenous Continuous Adonis Brook, MD 150 mL/hr at 09/03/15 0400     . acetaminophen (TYLENOL) tablet 650 mg  650 mg Oral Q4H PRN Adonis Brook, MD        Or   . acetaminophen (TYLENOL) suppository 650 mg  650 mg Rectal Q4H PRN Adonis Brook, MD       . acetaminophen (TYLENOL) tablet 650 mg  650 mg Oral Q6H PRN Adonis Brook, MD       . aspirin chewable tablet 81 mg  81 mg Oral Daily Adonis Brook, MD   81 mg at 09/02/15 1546   . atorvastatin (LIPITOR) tablet 20 mg   20 mg Oral Daily Adonis Brook, MD   20 mg at 09/02/15 1546   . benzonatate (TESSALON) capsule 100 mg  100 mg Oral TID PRN Adonis Brook, MD       . bisacodyl (DULCOLAX) suppository 10 mg  10 mg Rectal QD PRN Adonis Brook, MD       . dextrose (GLUCOSE) 40 % oral gel 15 g of glucose  15 g of glucose Oral PRN Adonis Brook, MD        And   . dextrose (D10W) 10% bolus 125 mL  125 mL Intravenous PRN Suthrave, Bobbye Riggs, MD        And   . glucagon (rDNA) (GLUCAGEN) injection 1 mg  1 mg Intramuscular PRN Adonis Brook, MD       . heparin (porcine) injection 5,000 Units  5,000 Units Subcutaneous Q12H [facility] Adonis Brook, MD   5,000 Units at 09/03/15 0538   . insulin lispro (HumaLOG) injection 1-6 Units  1-6 Units Subcutaneous QHS PRN Suthrave, Aswani K, MD       . insulin lispro (HumaLOG) injection 2-10 Units  2-10 Units Subcutaneous TID AC PRN Adonis Brook, MD       . insulin  NPH-regular 70-30 MIXTURE (HumuLIN 70/30,NovoLIN 70/30) injection 34 Units  34 Units Subcutaneous Daily before dinner Adonis Brook, MD   34 Units at 09/02/15 1731   . insulin NPH-regular 70-30 MIXTURE (HumuLIN 70/30,NovoLIN 70/30) injection 72 Units  72 Units Subcutaneous QAM AC Adonis Brook, MD   72 Units at 09/03/15 1000   . latanoprost (XALATAN) 0.005 % ophthalmic solution 1 drop  1 drop Both Eyes QHS Adonis Brook, MD   1 drop at 09/02/15 2144   . morphine injection 2 mg  2 mg Intravenous Q4H PRN Adonis Brook, MD       . naloxone (NARCAN) injection 0.2 mg  0.2 mg Intravenous PRN Adonis Brook, MD       . nitroglycerin (NITRO-BID) 2 % ointment 2 inch  2 inch Topical Q6H PRN Suthrave, Aswani K, MD       . ondansetron (ZOFRAN-ODT) disintegrating tablet 4 mg  4 mg Oral Q6H PRN Adonis Brook, MD        Or   . ondansetron (ZOFRAN) injection 4 mg  4 mg Intravenous Q6H PRN Adonis Brook, MD       . traZODone (DESYREL) tablet 50 mg  50 mg Oral QHS PRN Adonis Brook, MD            Past History     Past Medical History:  Past Medical History   Diagnosis Date   . Glaucoma 06/01/2012   . Diabetes mellitus type 2 in obese    . Hypertension    . Hyperlipidemia    . Obesity    . Diabetic retinopathy 06/28/2013     Of the right eye, per patient   . Venous insufficiency 09/17/2014     Followed at [facility].  Wears compression stockings.        Past Surgical History:  Past Surgical History   Procedure Laterality Date   . Breast cyst excision  1993     bilateral   . Tubal ligation         Family History:  Family History   Problem Relation Age of Onset   . Heart disease Mother    . Diabetes Mother    . Hypertension Mother    . Hyperlipidemia Mother    . Heart disease Father    . Diabetes Father    . Hypertension Father    . Hyperlipidemia Father    . Cirrhosis Father    . Liver cancer Maternal Aunt    . Cancer Maternal Uncle      liver   . Diabetes Sister        Social History:  Social History   Substance Use Topics   . Smoking status: Never Smoker    . Smokeless tobacco: Never Used   . Alcohol Use: No       Allergies:  Allergies   Allergen Reactions   . Shellfish-Derived Products        Review of Systems     Review of Systems   Constitutional: Negative for fever and activity change.   HENT: Negative for trouble swallowing.    Eyes: Negative for discharge.   Respiratory: Negative for cough and shortness of breath.    Cardiovascular: Negative for chest pain.   Gastrointestinal: Positive for nausea. Negative for vomiting and abdominal pain.   Genitourinary: Negative for dysuria.   Musculoskeletal: Positive for back pain and neck stiffness. Negative for neck pain.   Skin: Negative  for rash.   Neurological: Positive for light-headedness. Negative for headaches.   Psychiatric/Behavioral: Negative for confusion.         Physical Exam   BP 138/61 mmHg  Pulse 67  Temp(Src) 97.5 F (36.4 C) (Oral)  Resp 18  Ht 5\' 6"  (1.676 m)  Wt 89.495 kg  BMI 31.86 kg/m2  SpO2 98%      Physical  Exam   Constitutional: She is oriented to person, place, and time. She appears distressed ( lethargic appearing).   HENT:   Head: Normocephalic and atraumatic.   Dry MM   Eyes: EOM are normal. Pupils are equal, round, and reactive to light.   Neck: Normal range of motion. Neck supple.   Cardiovascular: Regular rhythm, normal heart sounds and intact distal pulses.  Bradycardia present.    Pulmonary/Chest: Effort normal and breath sounds normal. No respiratory distress. She has no wheezes. She has no rales.   Musculoskeletal: Normal range of motion. She exhibits no edema or tenderness.   Neurological: She is oriented to person, place, and time. No cranial nerve deficit. Coordination normal. GCS score is 15.   Skin: Skin is warm and dry. She is not diaphoretic.   Psychiatric: Mood, memory and judgment normal.   Nursing note and vitals reviewed.        Diagnostic Study Results     Labs -     Results     Procedure Component Value Units Date/Time    Glucose Whole Blood - POCT [[id number]] Collected:  09/03/15 0654     POCT - Glucose Whole blood 91 mg/dL Updated:  19/14/78 2956    Hemoglobin A1C [[id number]]  (Abnormal) Collected:  09/03/15 0429    Specimen Information:  Blood Updated:  09/03/15 0727     Hemoglobin A1C 8.4 (H) %      Average Estimated Glucose 194.4 mg/dL     Lipid panel [[id number]] Collected:  09/03/15 0429    Specimen Information:  Blood Updated:  09/03/15 0720     Cholesterol 119 mg/dL      Triglycerides 413 mg/dL      HDL 43 mg/dL      LDL Calculated 48 mg/dL      VLDL Cholesterol Cal 28 mg/dL      CHOL/HDL Ratio 2.8     Hemolysis index [[id number]] Collected:  09/03/15 0429     Hemolysis Index 6 Updated:  09/03/15 0720    Troponin I [[id number]] Collected:  09/03/15 0429     Troponin I 0.03 ng/mL Updated:  09/03/15 0523    Comprehensive metabolic panel [[id number]]  (Abnormal) Collected:  09/03/15 0429    Specimen Information:  Blood Updated:  09/03/15 0516     Glucose 90 mg/dL      BUN 75.6 mg/dL       Creatinine 0.8 mg/dL      Sodium 433 mEq/L      Potassium 3.6 mEq/L      Chloride 106 mEq/L      CO2 28 mEq/L      Calcium 8.6 mg/dL      Protein, Total 6.1 g/dL      Albumin 3.0 (L) g/dL      AST (SGOT) 52 (H) U/L      ALT 68 (H) U/L      Alkaline Phosphatase 101 U/L      Bilirubin, Total 0.4 mg/dL      Globulin 3.1 g/dL      Albumin/Globulin Ratio 1.0  Anion Gap 8.0     Magnesium [[id number]] Collected:  09/03/15 0429    Specimen Information:  Blood Updated:  09/03/15 0516     Magnesium 1.8 mg/dL     Phosphorus [[id number]] Collected:  09/03/15 0429    Specimen Information:  Blood Updated:  09/03/15 0516     Phosphorus 3.8 mg/dL     Hemolysis index [[id number]] Collected:  09/03/15 0429     Hemolysis Index 4 Updated:  09/03/15 0516    GFR [[id number]] Collected:  09/03/15 0429     EGFR >60.0 Updated:  09/03/15 0516    CBC and differential [[id number]]  (Abnormal) Collected:  09/03/15 0429    Specimen Information:  Blood from Blood Updated:  09/03/15 0449     WBC 9.08 x10 3/uL      Hgb 11.4 (L) g/dL      Hematocrit 24.4 (L) %      Platelets 188 x10 3/uL      RBC 3.40 (L) x10 6/uL      MCV 97.1 fL      MCH 33.5 (H) pg      MCHC 34.5 g/dL      RDW 13 %      MPV 11.6 fL      Neutrophils 53.5 %      Lymphocytes Automated 33.6 %      Monocytes 9.4 %      Eosinophils Automated 3.0 %      Basophils Automated 0.3 %      Immature Granulocyte 0.2 %      Nucleated RBC 0.0 /100 WBC      Neutrophils Absolute 4.86 x10 3/uL      Abs Lymph Automated 3.05 x10 3/uL      Abs Mono Automated 0.85 x10 3/uL      Abs Eos Automated 0.27 x10 3/uL      Absolute Baso Automated 0.03 x10 3/uL      Absolute Immature Granulocyte 0.02 x10 3/uL      Absolute NRBC 0.00 x10 3/uL     Troponin I [[id number]] Collected:  09/03/15 0429    Specimen Information:  Blood Updated:  09/03/15 0429    Glucose Whole Blood - POCT [[id number]]  (Abnormal) Collected:  09/02/15 2348     POCT - Glucose Whole blood 107 (H) mg/dL Updated:  59/56/38 7564    Arterial Blood  Gas (ABG) [[id number]]  (Abnormal) Collected:  09/02/15 1129    Specimen Information:  Blood, Arterial Updated:  09/02/15 2220     pH, Arterial 7.374      pCO2, Arterial 40.5 mmhg      pO2, Arterial 83.4 mmhg      HCO3, Arterial 23.6 mEq/L      Arterial Total CO2 48.9 (H) mEq/L      Base Excess, Arterial -1.5 mEq/L      O2 Sat, Arterial 96.9 %      ABG CollectionSite Right Radl      Allen's Test Yes      Temperature 37.0      O2 Delivery Room Air     Hepatitis B (HBV) Core antibody, IgM [[id number]] Collected:  09/02/15 1720    Specimen Information:  Blood Updated:  09/02/15 2214     Hep B C IgM Nonreactive     Hepatitis A antibody, IgM [[id number]] Collected:  09/02/15 1720    Specimen Information:  Blood Updated:  09/02/15 2210     Hep A IgM Nonreactive     Hepatitis B (  HBV) Surface Antibody Sharene Butters [[id number]] Collected:  09/02/15 1720    Specimen Information:  Blood Updated:  09/02/15 2209     HEPATITIS B SURFACE ANTIBODY <8.00     Hepatitis C (HCV) antibody, Total [[id number]] Collected:  09/02/15 1720    Specimen Information:  Blood Updated:  09/02/15 2209     Hepatitis C, AB Non-Reactive     Hepatitis B (HBV) Surface Antigen [[id number]] Collected:  09/02/15 1720    Specimen Information:  Blood Updated:  09/02/15 2209     Hepatitis B Surface AG Non-Reactive     Hemolysis index [[id number]] Collected:  09/02/15 1720     Hemolysis Index 11 Updated:  09/02/15 2140    Glucose Whole Blood - POCT [[id number]] Collected:  09/02/15 2121     POCT - Glucose Whole blood 93 mg/dL Updated:  23/55/73 2202    Glucose Whole Blood - POCT [[id number]]  (Abnormal) Collected:  09/02/15 2034     POCT - Glucose Whole blood 63 (L) mg/dL Updated:  62/83/15 1761    Basic Metabolic Panel [[id number]]  (Abnormal) Collected:  09/02/15 1720     Glucose 150 (H) mg/dL Updated:  69/48/54 6270     BUN 16.0 mg/dL      Creatinine 1.0 mg/dL      Calcium 9.2 mg/dL      Sodium 350 mEq/L      Potassium 4.5 mEq/L      Chloride 106 mEq/L      CO2 22 mEq/L       Anion Gap 12.0     Hemolysis index [[id number]] Collected:  09/02/15 1720     Hemolysis Index 13 Updated:  09/02/15 1835    GFR [[id number]] Collected:  09/02/15 1720     EGFR 56.6 Updated:  09/02/15 1835    Microalbumin, Semiquantitative [[id number]]  (Abnormal) Collected:  09/02/15 1344     Urine Microalbumin, Random 18.0 Updated:  09/02/15 1812     Urine Creatinine, Random 28.4 mg/dL      Urine Microalbumin/Creatinine Ratio 63 (H) ug/mg     Chloride, urine, random [[id number]] Collected:  09/02/15 1344    Specimen Information:  Urine Updated:  09/02/15 1812     Chloride, UR 105 mEq/L     Potassium, urine, random [[id number]] Collected:  09/02/15 1344    Specimen Information:  Urine Updated:  09/02/15 1812     Potassium, Urine 37.1 mEq/L     Sodium, urine, random [[id number]] Collected:  09/02/15 1344    Specimen Information:  Urine Updated:  09/02/15 1812     Sodium, UR 87 mEq/L     Troponin I [[id number]] Collected:  09/02/15 1720    Specimen Information:  Blood Updated:  09/02/15 1800     Troponin I 0.03 ng/mL     Basic Metabolic Panel [[id number]] Collected:  09/02/15 1720    Specimen Information:  Blood Updated:  09/02/15 1720    Glucose Whole Blood - POCT [[id number]]  (Abnormal) Collected:  09/02/15 1525     POCT - Glucose Whole blood 207 (H) mg/dL Updated:  97/67/34 1937    UA, Reflex to Microscopic [[id number]]  (Abnormal) Collected:  09/02/15 1344    Specimen Information:  Urine Updated:  09/02/15 1411     Urine Type Clean Catch      Color, UA Yellow      Clarity, UA Clear      Specific Gravity UA <1.005      Urine pH 6.0      Leukocyte  Esterase, UA Negative      Nitrite, UA Negative      Protein, UR Negative      Glucose, UA >=1000 (A)      Ketones UA Negative      Urobilinogen, UA 0.2 mg/dL      Bilirubin, UA Negative      Blood, UA Negative      RBC, UA 0 - 2 /hpf      Squamous Epithelial Cells, Urine 0 - 5 /hpf      Hyaline Casts, UA 4 - 5 /lpf     Glucose Whole Blood - POCT [[id number]]  (Abnormal) Collected:   09/02/15 1319     POCT - Glucose Whole blood 269 (H) mg/dL Updated:  47/82/95 6213    GFR [[id number]] Collected:  09/02/15 1129     EGFR 32.9 Updated:  09/02/15 1216    Comprehensive metabolic panel [[id number]]  (Abnormal) Collected:  09/02/15 1129    Specimen Information:  Blood Updated:  09/02/15 1215     Glucose 405 (H) mg/dL      BUN 24.4 (H) mg/dL      Creatinine 1.6 (H) mg/dL      Sodium 010 (L) mEq/L      Potassium 6.5 (HH) mEq/L      Chloride 102 mEq/L      CO2 25 mEq/L      Calcium 8.5 mg/dL      Protein, Total 6.5 g/dL      Albumin 3.2 (L) g/dL      AST (SGOT) 98 (H) U/L      ALT 64 (H) U/L      Alkaline Phosphatase 116 (H) U/L      Bilirubin, Total 0.5 mg/dL      Globulin 3.3 g/dL      Albumin/Globulin Ratio 1.0      Anion Gap 6.0     Troponin I [[id number]] Collected:  09/02/15 1129    Specimen Information:  Blood Updated:  09/02/15 1215     Troponin I 0.03 ng/mL     Hemolysis index [[id number]] Collected:  09/02/15 1129     Hemolysis Index 7 Updated:  09/02/15 1215    ELECTROLYTES WHOLE BLOOD [[id number]]  (Abnormal) Collected:  09/02/15 1129     Sodium Whole Blood 134 (L) mEq/L Updated:  09/02/15 1149     Potassium Whole Blood 5.6 (H) mEq/L      Chloride, WB 104 mEq/L      Glucose Whole Blood 374 (H) mg/dL     Calcium, ionized [[id number]]  (Abnormal) Collected:  09/02/15 1129     Calcium, Ionized 2.15 (L) mEq/L Updated:  09/02/15 1149    Lactic acid, plasma [[id number]] Collected:  09/02/15 1140     Lactic acid 2.0 mmol/L Updated:  09/02/15 1149    Co-Oximetry Profile [[id number]]  (Abnormal) Collected:  09/02/15 1129     Total Hemoglobin 11.5 (L) g/dL Updated:  22/02/54 2706     Carboxyhemoglobin 1.4 %      Oxygenated Hemoglobin 95.2 %      Methemoglobin 0.4 %      O2 Content 15.5      Total Hematocrit Calculated 35.4 (L) %     CBC with differential [[id number]]  (Abnormal) Collected:  09/02/15 1129    Specimen Information:  Blood from Blood Updated:  09/02/15 1144     WBC 11.65 (H) x10 3/uL      Hgb 12.3  g/dL      Hematocrit 17.6 (L) %  Platelets 212 x10 3/uL      RBC 3.73 (L) x10 6/uL      MCV 98.7 fL      MCH 33.0 (H) pg      MCHC 33.4 g/dL      RDW 13 %      MPV 11.7 fL      Neutrophils 72.1 %      Lymphocytes Automated 17.6 %      Monocytes 8.0 %      Eosinophils Automated 1.5 %      Basophils Automated 0.3 %      Immature Granulocyte 0.5 %      Nucleated RBC 0.0 /100 WBC      Neutrophils Absolute 8.41 (H) x10 3/uL      Abs Lymph Automated 2.05 x10 3/uL      Abs Mono Automated 0.93 x10 3/uL      Abs Eos Automated 0.17 x10 3/uL      Absolute Baso Automated 0.03 x10 3/uL      Absolute Immature Granulocyte 0.06 (H) x10 3/uL      Absolute NRBC 0.00 x10 3/uL     Glucose Whole Blood - POCT [[id number]]  (Abnormal) Collected:  09/02/15 1127     POCT - Glucose Whole blood 409 (H) mg/dL Updated:  95/28/41 3244          Radiologic Studies -   Radiology Results (24 Hour)     Procedure Component Value Units Date/Time    US Abdomen Complete [[id number]] Collected:  09/02/15 1650    Order Status:  Completed Updated:  09/02/15 1700    Narrative:      ABDOMINAL ULTRASOUND    CLINICAL STATEMENT: Liver function tests    COMPARISON: No prior studies are available for comparison.    TECHNIQUE: A sonogram of the abdomen was performed assessing gray scale  appearance and color Doppler.    FINDINGS:  Study is suboptimal due to body habitus and bowel gas.    LIVER:  The liver is diffusely increased in echogenicity and the right  hepatic lobe measures 19.5 cm in longitudinal dimension. There is no  intrahepatic biliary ductal dilatation.    BILIARY SYSTEM: No sludge or stones are present within the gallbladder.  There is mild diffuse gallbladder wall thickening, likely due to  contraction. The technologist indicates that there is a negative  sonographic Murphy sign. The extrahepatic biliary duct measures 4 mm in  diameter.     PANCREAS: The pancreas is poorly visualized.      SPLEEN: The spleen is normal in echogenicity, measuring 8.7  cm in  longitudinal dimension. No focal splenic lesion is seen.    KIDNEYS: The right kidney measures 10.5 cm and the left kidney measures  11.7 cm in longitudinal dimension. There is no hydronephrosis or  nephrolithiasis and there is normal renal cortical echogenicity.     AORTA: Not well seen.    No ascites is visualized.      Impression:            Borderline hepatomegaly with  hepatic steatosis.    No gallstones. Diffusely thick-walled gallbladder, likely due to  contraction. If there is clinical concern surgical status, this can be  further evaluated with a HIDA scan.    Jonette Pesa, MD   09/02/2015 4:56 PM      Chest AP Portable [[id number]] Collected:  09/02/15 1234    Order Status:  Completed Updated:  09/02/15 1239    Narrative:  History: chest pain    Technique: Single frontal radiograph of the chest    Comparison: Radiograph 02/21/2014    Findings:  There is increased lung markings bilaterally. No evidence of infiltrate  or effusion  There is no pneumothorax.  There is moderate cardiomegaly.     Atherosclerotic calcifications are  seen in the aorta.     Degenerative shoulder and spine      Impression:       Chronic lung changes with no acute  cardiopulmonary disease.    Gaynelle Arabian, MD   09/02/2015 12:35 PM        .    Medical Decision Making   I am the first provider for this patient.    I reviewed the vital signs, available nursing notes, past medical history, past surgical history, family history and social history.    Vital Signs-Reviewed the patient's vital signs.     Patient Vitals for the past 12 hrs:   BP Temp Pulse Resp   09/03/15 0652 138/61 mmHg 97.5 F (36.4 C) - 18   09/03/15 0422 149/63 mmHg 98.1 F (36.7 C) 67 20   09/02/15 2347 142/65 mmHg 97.7 F (36.5 C) 62 18       Pulse Oximetry Analysis - Normal 98% on RA    Cardiac Monitor:  Rate: 44 bpm  Rhythm:  Sinus bradycardia    EKG:  Interpreted by the EP.   Time Interpreted: 1124   Rate: 40 bpm   Rhythm: Junctional  bradycardia   Interpretation: Peaked T waves in anterior leads. Normal axis.    Comparison: Compared to EKG from 02/2011, patient was in NSR and rate of 87 bpm at that time.     EKG:  Interpreted by the Emergency Physician.   Time Interpreted: 1211   Rate: 59 bpm    Rhythm: Sinus bradycardia   Interpretation: 1st degree AV block. Normal axis.   Comparison: Compared to prior EKG, P waves are now visualized.       Admit Decision Time:  The decision to admit this patient was made by the emergency provider at 1239 on 09/02/2015       Old Medical Records: Nursing notes.     ED Course:     1125. Contacted RT regarding ABG supergas draw.     1215. Critical lab value of Potassium 6.5 reviewed.     1239. D/w Dr. Gearldine Shown, [facility] on call, who accepts case for hospitalization into IMCU inpatient. Will repeat K and glucose levels.     1305. D/w Dr. Arlyss Queen, cardiology, who will consult on patient's case.     Provider Notes: DDx- arrhythmia, anemia, dehydration, orthostasis, metabolic derangement, BB/ CCB overdose,   Pt presents with profound bradycardia lightheadedness/ near syncope, labs demonstrate hyperkalemia secondary to ARF likely causing bradycardia in the setting of BB and CCB use. No evidence of heart block on EKG, given Ca and Glucagon for BB/CCB reversal, with improvement of symptoms, admitted for further observation and treatment.      Critical Care Time:    CRITICAL CARE: The high probability of sudden, clinically significant deterioration in the patient's condition required the highest level of my preparedness to intervene urgently.    The services I provided to this patient were to treat and/or prevent clinically significant deterioration that could result in: mortality.  Services included the following: chart data review, reviewing nursing notes and/or old charts, documentation time, consultant collaboration regarding findings and treatment options, medication orders and management,  direct patient care,  re-evaluations, vital sign assessments and ordering, interpreting and reviewing diagnostic studies/lab tests.    Aggregate critical care time was 60 minutes, which includes only time during which I was engaged in work directly related to the patient's care, as described above, whether at the bedside or elsewhere in the Emergency Department.  It did not include time spent performing other reported procedures or the services of residents, students, nurses or physician assistants.          Diagnosis     Clinical Impression:   1. Acute renal failure, unspecified acute renal failure type    2. Symptomatic bradycardia    3. Hyperkalemia    4. Dehydration    5. Hyperglycemia        Treatment Plan:   ED Disposition     Admit Admitting Physician: Adonis Brook [[id number]]  Diagnosis: Hyperkalemia [865784]  Estimated Length of Stay: > or = to 2 midnights  Tentative Discharge Plan?: Home or Self Care [1]  Patient Class: Inpatient [101]              _______________________________      Attestations: This note is prepared by Leonia Reader, acting as scribe for Dr. French Ana, MD.    Dr. French Ana, MD - The scribe's documentation has been prepared under my direction and personally reviewed by me in its entirety.  I confirm that the note above accurately reflects all work, treatment, procedures, and medical decision making performed by me.    _______________________________      French Ana, MD  09/03/15 1012

## 2015-09-03 LAB — ECG 12-LEAD
Atrial Rate: 357 {beats}/min
Atrial Rate: 59 {beats}/min
P Axis: -14 degrees
P-R Interval: 326 ms
Q-T Interval: 510 ms
Q-T Interval: 444 ms
QRS Duration: 88 ms
QRS Duration: 92 ms
QTC Calculation (Bezet): 415 ms
QTC Calculation (Bezet): 439 ms
R Axis: 60 degrees
R Axis: 68 degrees
T Axis: 46 degrees
T Axis: 48 degrees
Ventricular Rate: 40 {beats}/min
Ventricular Rate: 59 {beats}/min

## 2015-09-03 LAB — MAGNESIUM: Magnesium: 1.8 mg/dL (ref 1.6–2.6)

## 2015-09-03 LAB — CBC AND DIFFERENTIAL
Absolute NRBC: 0 10*3/uL
Basophils Absolute Automated: 0.03 10*3/uL (ref 0.00–0.20)
Basophils Automated: 0.3 %
Eosinophils Absolute Automated: 0.27 10*3/uL (ref 0.00–0.70)
Eosinophils Automated: 3 %
Hematocrit: 33 % — ABNORMAL LOW (ref 37.0–47.0)
Hgb: 11.4 g/dL — ABNORMAL LOW (ref 12.0–16.0)
Immature Granulocytes Absolute: 0.02 10*3/uL
Immature Granulocytes: 0.2 %
Lymphocytes Absolute Automated: 3.05 10*3/uL (ref 0.50–4.40)
Lymphocytes Automated: 33.6 %
MCH: 33.5 pg — ABNORMAL HIGH (ref 28.0–32.0)
MCHC: 34.5 g/dL (ref 32.0–36.0)
MCV: 97.1 fL (ref 80.0–100.0)
MPV: 11.6 fL (ref 9.4–12.3)
Monocytes Absolute Automated: 0.85 10*3/uL (ref 0.00–1.20)
Monocytes: 9.4 %
Neutrophils Absolute: 4.86 10*3/uL (ref 1.80–8.10)
Neutrophils: 53.5 %
Nucleated RBC: 0 /100 WBC (ref 0.0–1.0)
Platelets: 188 10*3/uL (ref 140–400)
RBC: 3.4 10*6/uL — ABNORMAL LOW (ref 4.20–5.40)
RDW: 13 % (ref 12–15)
WBC: 9.08 10*3/uL (ref 3.50–10.80)

## 2015-09-03 LAB — COMPREHENSIVE METABOLIC PANEL
ALT: 68 U/L — ABNORMAL HIGH (ref 0–55)
AST (SGOT): 52 U/L — ABNORMAL HIGH (ref 5–34)
Albumin/Globulin Ratio: 1 (ref 0.9–2.2)
Albumin: 3 g/dL — ABNORMAL LOW (ref 3.5–5.0)
Alkaline Phosphatase: 101 U/L (ref 37–106)
Anion Gap: 8 (ref 5.0–15.0)
BUN: 12 mg/dL (ref 7.0–19.0)
Bilirubin, Total: 0.4 mg/dL (ref 0.2–1.2)
CO2: 28 mEq/L (ref 22–29)
Calcium: 8.6 mg/dL (ref 8.5–10.5)
Chloride: 106 mEq/L (ref 100–111)
Creatinine: 0.8 mg/dL (ref 0.6–1.0)
Globulin: 3.1 g/dL (ref 2.0–3.6)
Glucose: 90 mg/dL (ref 70–100)
Potassium: 3.6 mEq/L (ref 3.5–5.1)
Protein, Total: 6.1 g/dL (ref 6.0–8.3)
Sodium: 142 mEq/L (ref 136–145)

## 2015-09-03 LAB — GLUCOSE WHOLE BLOOD - POCT
Whole Blood Glucose POCT: 91 mg/dL (ref 70–100)
Whole Blood Glucose POCT: 198 mg/dL — ABNORMAL HIGH (ref 70–100)
Whole Blood Glucose POCT: 169 mg/dL — ABNORMAL HIGH (ref 70–100)
Whole Blood Glucose POCT: 217 mg/dL — ABNORMAL HIGH (ref 70–100)

## 2015-09-03 LAB — LIPID PANEL
Cholesterol / HDL Ratio: 2.8
Cholesterol: 119 mg/dL (ref 0–199)
HDL: 43 mg/dL (ref 40–9999)
LDL Calculated: 48 mg/dL (ref 0–99)
Triglycerides: 140 mg/dL (ref 34–149)
VLDL Calculated: 28 mg/dL (ref 10–40)

## 2015-09-03 LAB — HEMOLYSIS INDEX
Hemolysis Index: 4 (ref 0–18)
Hemolysis Index: 6 (ref 0–18)

## 2015-09-03 LAB — TROPONIN I: Troponin I: 0.03 ng/mL (ref 0.00–0.09)

## 2015-09-03 LAB — PHOSPHORUS: Phosphorus: 3.8 mg/dL (ref 2.3–4.7)

## 2015-09-03 LAB — GFR: EGFR: >60

## 2015-09-03 LAB — HEMOGLOBIN A1C
Average Estimated Glucose: 194.4 mg/dL
Hemoglobin A1C: 8.4 % — ABNORMAL HIGH (ref 4.6–5.9)

## 2015-09-03 MED ORDER — LISINOPRIL 10 MG PO TABS
10.0000 mg | ORAL_TABLET | Freq: Every day | ORAL | Status: DC
Start: 2015-09-03 — End: 2015-09-03
  Administered 2015-09-03: 10 mg via ORAL
  Filled 2015-09-03: qty 1

## 2015-09-03 MED ORDER — INSULIN NPH (HUMAN) (ISOPHANE) 100 UNIT/ML SC SUSP
50.0000 [IU] | Freq: Every morning | SUBCUTANEOUS | Status: DC
Start: 2015-09-04 — End: 2015-09-04
  Administered 2015-09-04: 50 [IU] via SUBCUTANEOUS
  Filled 2015-09-03: qty 0.5

## 2015-09-03 MED ORDER — METFORMIN HCL 500 MG PO TABS
1000.0000 mg | ORAL_TABLET | Freq: Two times a day (BID) | ORAL | Status: DC
Start: 2015-09-03 — End: 2015-09-04
  Administered 2015-09-03 – 2015-09-04 (×2): 1000 mg via ORAL
  Filled 2015-09-03 (×2): qty 2

## 2015-09-03 MED ORDER — LISINOPRIL 20 MG PO TABS
20.0000 mg | ORAL_TABLET | Freq: Every day | ORAL | Status: DC
Start: 2015-09-04 — End: 2015-09-04
  Administered 2015-09-04: 20 mg via ORAL
  Filled 2015-09-03: qty 1

## 2015-09-03 MED ORDER — METOPROLOL TARTRATE 25 MG PO TABS
25.0000 mg | ORAL_TABLET | Freq: Two times a day (BID) | ORAL | Status: DC
Start: 2015-09-03 — End: 2015-09-04

## 2015-09-03 MED ORDER — INSULIN NPH (HUMAN) (ISOPHANE) 100 UNIT/ML SC SUSP
24.0000 [IU] | Freq: Every day | SUBCUTANEOUS | Status: DC
Start: 2015-09-03 — End: 2015-09-04
  Administered 2015-09-03: 24 [IU] via SUBCUTANEOUS
  Filled 2015-09-03: qty 0.24

## 2015-09-03 NOTE — Progress Notes (Signed)
RENAL PROGRESS NOTE    Date Time: 09/03/2015 4:11 PM  Patient Name: [name] I  Attending Physician: Adonis Brook, MD    Assessment:   Acute kidney injury, likely volume related. U/A with hyaline cast.   Creatinine showing improvement  Hyperkalemia, Improved.  Hyponatremia, translocational due to hyperglycemia, improved  Likely has albuminuria.  Hypertension, blood pressure trending up  Bradycardia with junctional rhythm, cardiology is following.  Diabetes,   Coronary artery disease,  Plan:   Discontinue the IV fluid.  Encourage oral hydration.  Monitor blood pressure to further adjust the medication.  Needs to monitor closely for potassium/kidney function if started on lisinopril.  Monitor the renal function, electrolytes and fluid status.  Avoid nephrotoxic medication.    Discussed with the patient, needs follow-up for albuminuria.    Subjective:   Pt is seen and examined at bedside. In no distress.  Alert and awake  Review of Systems:   Denies any complaints.  Denies any nausea, vomiting, chest pain, palpitations, headache or visual changes.    Meds:     Current Facility-Administered Medications   Medication Dose Route Frequency   . aspirin  81 mg Oral Daily   . atorvastatin  20 mg Oral Daily   . heparin (porcine)  5,000 Units Subcutaneous Q12H [facility]   . insulin NPH-regular 70-30 MIXTURE  34 Units Subcutaneous Daily before dinner   . insulin NPH-regular 70-30 MIXTURE  72 Units Subcutaneous QAM AC   . latanoprost  1 drop Both Eyes QHS   . lisinopril  10 mg Oral Daily   . metFORMIN  1,000 mg Oral BID Meals   . metoprolol  25 mg Oral Q12H [facility]       Physical Exam:   BP 186/81 mmHg  Pulse 67  Temp(Src) 97.6 F (36.4 C) (Oral)  Resp 18  Ht 1.676 m (5\' 6" )  Wt 89.495 kg (197 lb 4.8 oz)  BMI 31.86 kg/m2  SpO2 97%    Intake/Output Summary (Last 24 hours) at 09/03/15 1611  Last data filed at 09/03/15 1012   Gross per 24 hour   Intake   1080 ml   Output      0 ml   Net   1080 ml       General appearance  - awake, in no distress.  Skin turgor fair  Neck - JVP not raised  Chest - B/L air entry.  Heart - S1, S2, .  Abdomen - soft, tender, distended,   No leg edema,    Labs:     Recent Labs      09/03/15   0429  09/02/15   1129   WBC  9.08  11.65*   HGB  11.4*  12.3   HEMATOCRIT  33.0*  36.8*   PLATELETS  188  212   MCV  97.1  98.7     Recent Labs      09/03/15   0429  09/02/15   1720   SODIUM  142  140   POTASSIUM  3.6  4.5   CHLORIDE  106  106   CO2  28  22   BUN  12.0  16.0   CREATININE  0.8  1.0   GLUCOSE  90  150*   CALCIUM  8.6  9.2   MAGNESIUM  1.8   --    PHOSPHORUS  3.8   --      Recent Labs      09/03/15   0429  09/02/15   1129   AST (SGOT)  52*  98*   ALT  68*  64*   ALKALINE PHOSPHATASE  101  116*   PROTEIN, TOTAL  6.1  6.5   ALBUMIN  3.0*  3.2*     No results for input(s): PTT, PT, INR in the last 72 hours.                Recent Labs  Lab 09/03/15  0429   CHOLESTEROL 119   TRIGLYCERIDES 140   HDL 43   LDL CALCULATED 48     Radiology Results (24 Hour)     Procedure Component Value Units Date/Time    US Abdomen Complete [[id number]] Collected:  09/02/15 1650    Order Status:  Completed Updated:  09/02/15 1700    Narrative:      ABDOMINAL ULTRASOUND    CLINICAL STATEMENT: Liver function tests    COMPARISON: No prior studies are available for comparison.    TECHNIQUE: A sonogram of the abdomen was performed assessing gray scale  appearance and color Doppler.    FINDINGS:  Study is suboptimal due to body habitus and bowel gas.    LIVER:  The liver is diffusely increased in echogenicity and the right  hepatic lobe measures 19.5 cm in longitudinal dimension. There is no  intrahepatic biliary ductal dilatation.    BILIARY SYSTEM: No sludge or stones are present within the gallbladder.  There is mild diffuse gallbladder wall thickening, likely due to  contraction. The technologist indicates that there is a negative  sonographic Murphy sign. The extrahepatic biliary duct measures 4 mm in  diameter.     PANCREAS: The  pancreas is poorly visualized.      SPLEEN: The spleen is normal in echogenicity, measuring 8.7 cm in  longitudinal dimension. No focal splenic lesion is seen.    KIDNEYS: The right kidney measures 10.5 cm and the left kidney measures  11.7 cm in longitudinal dimension. There is no hydronephrosis or  nephrolithiasis and there is normal renal cortical echogenicity.     AORTA: Not well seen.    No ascites is visualized.      Impression:            Borderline hepatomegaly with  hepatic steatosis.    No gallstones. Diffusely thick-walled gallbladder, likely due to  contraction. If there is clinical concern surgical status, this can be  further evaluated with a HIDA scan.    Jonette Pesa, MD   09/02/2015 4:56 PM            Mosie Lukes, MD  09/03/2015  4:11 PM  [phone]

## 2015-09-03 NOTE — Plan of Care (Signed)
Problem: Hemodynamic Status: Cardiac  Goal: Stable vital signs and fluid balance  Outcome: Progressing    09/03/15 1522   Goal/Interventions addressed this shift   Stable vital signs and fluid balance Monitor/assess vital signs and telemetry per unit protocol;Assess signs and symptoms associated with cardiac rhythm changes;Monitor lab values;Monitor for leg swelling/edema and report to LIP if abnormal         Comments:   HR continues to be 40's (at rest) to upper 60's.  No signs or symptoms at this time.

## 2015-09-03 NOTE — UM Notes (Signed)
[facility]  09/02/15 1243 Admit to Inpatient (Order [id number])  Admission    H&P:CHIEF COMPLAINT:  Dizziness and lightheadedness.    HISTORY OF PRESENT ILLNESS:  This is a 60 year old female with a past medical history of diabetes  mellitus type 2, diabetic retinopathy, hypertension, hyperlipidemia,  obesity, peripheral vascular disease, glaucoma, who presented to the  emergency room with a chief complaint of dizziness and lightheadedness. It  all started since waking up this morning. EMS was called and her EKG  revealed junctional bradycardia with heart rate in the 30s to 40s. She got  2 mg of atropine en route. In the emergency room, she was found to have  significant hyperkalemia of 6.5, and she received dextrose and insulin and  calcium gluconate to correct her potassium. Her heart rate has improved up  to 50s. She denies any chest pain or shortness of breath. She is  currently getting admitted for further management.    PAST MEDICAL HISTORY:  Glaucoma, diabetes mellitus type 2, diabetic retinopathy, hypertension,  hyperlipidemia, obesity, peripheral vascular disease.    Temp:  [97.1 F (36.2 C)-98.1 F (36.7 C)]   Heart Rate:  [38-67]   Resp Rate:  [17-20]   BP: (117-156)/(57-74)   SpO2:  [96 %-100 %]   Height:  [167.6 cm (5\' 6" )]   Weight:  [87.091 kg (192 lb)-90.583 kg (199 lb 11.2 oz)]   BMI (calculated):  [31.1-32.3]     Last recorded pain score:  Pain Scale Used: Numeric Scale (0-10)  Pain Score: 0-No pain     LABORATORY AND DIAGNOSTIC DATA:  WBC 11.6, hemoglobin 12.3, WBC 11.6, hemoglobin 12.3, hematocrit 36.8,  platelets 212. Glucose 405, BUN 23, creatinine 1.6, sodium 133, potassium  6.5, chloride 104, bicarbonate 25. GFR 32.9, calcium 8.5. AST elevated at  98. ALT elevated at 64. Alkaline phosphatase elevated at 116. Lactic  acid 2.0. Troponin 0.03. ABG unremarkable. Chest x-ray: No acute  cardiopulmonary disease.    Scheduled Meds:  Current Facility-Administered Medications    Medication Dose Route Frequency   . aspirin  81 mg Oral Daily   . atorvastatin  20 mg Oral Daily   . heparin (porcine)  5,000 Units Subcutaneous Q12H [facility]   . insulin NPH-regular 70-30 MIXTURE  34 Units Subcutaneous Daily before dinner   . insulin NPH-regular 70-30 MIXTURE  72 Units Subcutaneous QAM AC   . latanoprost  1 drop Both Eyes QHS     Continuous Infusions:  . sodium chloride 75 mL/hr at 09/03/15 1012     PRN Meds:.acetaminophen **OR** acetaminophen, acetaminophen, benzonatate, bisacodyl, Nursing communication: Adult Hypoglycemia Treatment Algorithm **AND** dextrose **AND** dextrose **AND** glucagon (rDNA) **AND** Nursing communication: Document Significant Event Note, insulin lispro, insulin lispro, morphine, naloxone, nitroglycerin, ondansetron **OR** ondansetron, traZODone   Completed meds:Kayexalate suspension 30g po xq, NS 0.9% IV x1, Zofran 4mg  IV x1, Insulin regular 6 units IV x1, Calcium Gluconate 1g IV x1, Atropine injectin 0.5mg  IV x1.    ASSESSMENT AND PLAN:  1. This 60 year old female with a past medical history of multiple medical  problems as described above, presented with hyperkalemia, causing  junctional bradycardia, acute kidney injury, and significant hyperglycemia.  2. This patient has hyperkalemia. She has received calcium gluconate and  dextrose, insulin in the emergency room. She will be monitored on  telemetry. We will give her Kayexalate. We will involve nephrology.   Given her hyperkalemia, we will hold her lisinopril for now. Because she  presented with acute kidney injury, we will hold  her hydrochlorothiazide as  well. We will aggressively hydrate the patient and will monitor her BUN  and creatinine and GFR. Because of her elevated creatinine, we will also  keep her metformin on hold.  3. She presented with a junctional bradycardia. Her heart rate is in the  high 50s. She does not require a transcutaneous pacer at the bedside at  the moment as I believe that  her junctional bradycardia is likely due to  hyperkalemia. We will keep her atenolol and Cardizem on hold. We will  involve cardiology. We will check her cardiac enzymes and an  echocardiogram will be ordered.  4. She has hyperglycemia. We will hydrate her generously, ensuring that  she is not in hyperosmolar state. She will continue her NPH 70/30 insulin  as per home regimen. We will check her hemoglobin A1c in the morning. She  will be on sliding scale. She will be on diabetic and cardiac diet. We  will continue her aspirin.  5. For her essential hypertension, her atenolol and Cardizem along with  hydrochlorothiazide are on hold. We will keep her on p.r.n. nitroglycerin  paste for now.   6. For her hyperlipidemia, we will continue Lipitor. We will check her  fasting lipids in the morning.  7. For her glaucoma, will continue Xalatan eyedrops.    She wishes to be full code. She will be on diabetic and cardiac diet. She  will be on both pharmacological and mechanical VT prophylaxis. We will  repeat her potassium levels in the next couple of hours. Further  management of this patient would be as per nephrology and cardiology.      Cardio consult:Reason for Consultation:  bradycardia    Assessment:  Symptomatic junctional bradycardia  - due to calcium channel blocker, b blocker  - resolved  - now SR with 1 AVB  Hyperkalemia ( K 6.5)  Acute renal failure ( SCr 1.6)  Coronary artery disease  - cardiac cath (2002) - LCx 30% stenosis  - stress test (01/2015) - normal; no ischemia or infarct  - mild, stable, no angina  Normal LV systolic function (01/2015)  Mild mitral regurgitation  HTN  DM    Plan:  Will hold off on all AV nodal blocking agents and observe on telemetry  Check echocardiogram  Serial cardiac enzymes  Will follow blood pressure and treat accordingly    EKG - Junctional bradycardia in 30s  EKG - NSR, 1  AVB  cxr - chronic lung changes     Nephrology note:    Reason for Consultation:  Acute kidney injury  Hyperkalemia,    Assessment:  Acute kidney injury, likely volume related. U/A with hyaline cast.   Hyperkalemia, likely related to above in the setting of acute kidney injury/hyperglycemia and lisinopril use.  Hyponatremia, translocational due to hyperglycemia,  Possible albuminuria per history.  Hypertension, was hypotensive on presentation,   Bradycardia with junctional rhythm, cardiology is following.  Diabetes,   Coronary artery disease,    Plan:  Medical management for hyperkalemia.  Repeat potassium later today.  Started on IV fluids.  Follow U/A and lytes.  Renal US once stable.  Agree with holding lisinopril/HCTZ, metformin.  Monitor the renal function, electrolytes and fluid status.  Meds in renal dose and avoid nephrotoxic.  Monitor urine output. Bladder scan on the floor to rule out any retention.  Discussed with the daughter at bedside in detail whos also helped in the history.  Discussed with the primary team.  Redmond School, RN BSN  Utilization Review Case Manager  Case Manager Dept.   [facility]  Phone [phone]  Fax [phone]  [facility] ext [phone]

## 2015-09-03 NOTE — Plan of Care (Signed)
Problem: Hemodynamic Status: Cardiac  Goal: Stable vital signs and fluid balance  Outcome: Progressing    09/03/15 0403   Goal/Interventions addressed this shift   Stable vital signs and fluid balance Monitor/assess vital signs and telemetry per unit protocol;Weigh on admission and record weight daily;Assess signs and symptoms associated with cardiac rhythm changes;Monitor lab values       Problem: Nutrition  Goal: Nutritional intake is adequate  Outcome: Progressing    09/03/15 0403   Goal/Interventions addressed this shift   Nutritional intake is adequate Allow adequate time for meals;Include patient/patient care companion in decisions related to nutrition     Comments:   Patient remains stable overnight. No cardiac event. NS at 150 cc/hr. Denies pain.

## 2015-09-03 NOTE — Progress Notes (Signed)
[facility]  Progress Note      Date Time: 09/03/2015 11:36 AM  Patient Name: [name], [name]     Assessment:   Symptomatic bradycardia  Hyperkalemia  CAD  HTN  DM      Plan:   [name] would [place] low dose Ace , low dose Ca blockers for BP control. Can be [place];d from CV standpoint.       Signed by: Fortino Sic, MD  [facility])  [phone]  MD LINE [phone]    Patient Active Problem List   Diagnosis   . Glaucoma   . Diabetes mellitus type 2 in obese   . Hypertension   . Hyperlipidemia   . Obesity   . Elevated liver enzymes   . Fatty liver   . Diabetic retinopathy   . Non-rheumatic mitral regurgitation   . Venous insufficiency   . Cystocele   . Hyperkalemia       Subjective:   Denies chest pain, SOB or palpitations.    Medications:     Current Facility-Administered Medications   Medication Dose Route Frequency Provider Last Rate Last Dose   . 0.9%  NaCl infusion   Intravenous Continuous Adonis Brook, MD 75 mL/hr at 09/03/15 1012     . acetaminophen (TYLENOL) tablet 650 mg  650 mg Oral Q4H PRN Adonis Brook, MD        Or   . acetaminophen (TYLENOL) suppository 650 mg  650 mg Rectal Q4H PRN Adonis Brook, MD       . acetaminophen (TYLENOL) tablet 650 mg  650 mg Oral Q6H PRN Adonis Brook, MD       . aspirin chewable tablet 81 mg  81 mg Oral Daily Adonis Brook, MD   81 mg at 09/03/15 1011   . atorvastatin (LIPITOR) tablet 20 mg  20 mg Oral Daily Adonis Brook, MD   20 mg at 09/03/15 1011   . benzonatate (TESSALON) capsule 100 mg  100 mg Oral TID PRN Adonis Brook, MD       . bisacodyl (DULCOLAX) suppository 10 mg  10 mg Rectal QD PRN Adonis Brook, MD       . dextrose (GLUCOSE) 40 % oral gel 15 g of glucose  15 g of glucose Oral PRN Adonis Brook, MD        And   . dextrose (D10W) 10% bolus 125 mL  125 mL Intravenous PRN Suthrave, Bobbye Riggs, MD        And   . glucagon (rDNA) (GLUCAGEN) injection 1 mg  1 mg Intramuscular PRN Adonis Brook, MD       .  heparin (porcine) injection 5,000 Units  5,000 Units Subcutaneous Q12H [facility] Adonis Brook, MD   5,000 Units at 09/03/15 0538   . insulin lispro (HumaLOG) injection 1-6 Units  1-6 Units Subcutaneous QHS PRN Suthrave, Aswani K, MD       . insulin lispro (HumaLOG) injection 2-10 Units  2-10 Units Subcutaneous TID AC PRN Suthrave, Aswani K, MD       . insulin NPH-regular 70-30 MIXTURE (HumuLIN 70/30,NovoLIN 70/30) injection 34 Units  34 Units Subcutaneous Daily before dinner Adonis Brook, MD   34 Units at 09/02/15 1731   . insulin NPH-regular 70-30 MIXTURE (HumuLIN 70/30,NovoLIN 70/30) injection 72 Units  72 Units Subcutaneous QAM AC Adonis Brook, MD   72 Units at 09/03/15 1000   . latanoprost (XALATAN) 0.005 %  ophthalmic solution 1 drop  1 drop Both Eyes QHS Adonis Brook, MD   1 drop at 09/02/15 2144   . morphine injection 2 mg  2 mg Intravenous Q4H PRN Adonis Brook, MD       . naloxone (NARCAN) injection 0.2 mg  0.2 mg Intravenous PRN Adonis Brook, MD       . nitroglycerin (NITRO-BID) 2 % ointment 2 inch  2 inch Topical Q6H PRN Suthrave, Aswani K, MD       . ondansetron (ZOFRAN-ODT) disintegrating tablet 4 mg  4 mg Oral Q6H PRN Adonis Brook, MD        Or   . ondansetron (ZOFRAN) injection 4 mg  4 mg Intravenous Q6H PRN Suthrave, Bobbye Riggs, MD       . traZODone (DESYREL) tablet 50 mg  50 mg Oral QHS PRN Adonis Brook, MD           Physical Exam:     Filed Vitals:    09/03/15 0652   BP: 138/61   Pulse:    Temp: 97.5 F (36.4 C)   Resp: 18   SpO2: 98%     Temp (24hrs), Avg:97.7 F (36.5 C), Min:97.1 F (36.2 C), Max:98.1 F (36.7 C)      Telemetry reviewed no changes.     Intake and Output Summary (Last 24 hours) at Date Time    Intake/Output Summary (Last 24 hours) at 09/03/15 1136  Last data filed at 09/03/15 1012   Gross per 24 hour   Intake 1336.67 ml   Output    775 ml   Net 561.67 ml       General Appearance:  Breathing comfortable, no acute distress  Head:   normocephalic  Eyes:  EOM's intact  Neck:  No carotid bruit or jugular venous distension, brisk carotid upstroke  Lungs:  Clear to auscultation throughout, no wheezes, rhonchi or rales, good respiratory effort   Chest Wall:  No tenderness or deformity  Heart:  S1, S2 normal, no S3, no S4, no murmur, PMI not displaced, no rub   Abdomen:  Soft, non-tender, positive bowel sounds, no hepatojugular reflux  Extremities:  No cyanosis, clubbing or edema  Pulses:  Equal radial pulses, 4/4 symmetric  Neurologic:  Alert and oriented x3, mood and affect normal    Labs:   Recent CMP   Recent Labs      09/03/15   0429   GLUCOSE  90   BUN  12.0   CREATININE  0.8   SODIUM  142   CO2  28   CALCIUM  8.6   ALBUMIN  3.0*   AST (SGOT)  52*   ALT  68*   GLOBULIN  3.1     Recent CARDIAC ENZYMES No results for input(s): TROPONIN, ISTATTROPONI, CK in the last 24 hours.    Invalid input(s): TROPONINT, CKMB[24  Recent TSH Invalid input(s): TSH[24  Recent PT/PTT No results for input(s): INR, PTT in the last 24 hours.    Invalid input(s): PTI, COUM, COUMP, ACOAG, ACOAP[24  Recent CBC WITH DIFF Recent Labs      09/03/15   0429   RBC  3.40*   HGB  11.4*   HEMATOCRIT  33.0*   MCV  97.1   MCHC  34.5   RDW  13   MPV  11.6   PLATELETS  188     Recent LIPID PANEL Recent Labs      09/03/15  0429   CHOLESTEROL  119   TRIGLYCERIDES  140   HDL  43     Recent ABG No results for input(s): TEMP, FIO2, RATE, MODE, ETCO2, PEEP in the last 24 hours.    Invalid input(s): APH, APCO2, APO2, AHCO3, ATCO2, ABE, AOSAT, ABGS, ALLEN, STATS, O2DEL, O2FLO, PRESS, VNTMN, PRSUP, TIVOL[24      Rads:   Radiological Procedure reviewed.    ECG- sinus brady, 1st degree av block

## 2015-09-03 NOTE — Plan of Care (Signed)
Telephoned Dr. Gearldine Shown re: AM dose of 70/30 and BS=91. He said hold this mornings dose.  Cont to monitor for signs/symptoms of hyper/hypo- glycemia.

## 2015-09-03 NOTE — Progress Notes (Addendum)
[facility]  PROGRESS NOTE      Patient: [name]  Date: 09/03/2015   LOS: 1 Days  Admission Date: 09/02/2015   MRN: [id number]  Attending: Adonis Brook  Please contact me on the following Spectralink [phone]       ASSESSMENT/PLAN     [name] is a 60 y.o. female admitted with hyperkalemia, acute kidney injury   and junctional bradycardia    Interval Summary:     60 year old female with a past medical history of diabetes mellitus type 2, diabetic retinopathy, hypertension, hyperlipidemia, obesity, CAD, glaucoma presented with    Junctional bradycardia - likely due to hyperkalemia - resolved - resume low dose BB; echo pending    Hyperkalemia - etiology unclear - corrected - resume low dose ACE-I given microalbuminuria    Acute Kidney Injury - prerenal - resolved with IVF - renal sonogram ok    CAD - on ASA, statins; stable    DM II - uncontrolled - A1C 8.4 - on NPH 70/30 + SSI; on ASA, ACE-I, statins; on   diabetic + cardiac diet; resume metformin as AKI is now resolved.     essential hypertension - d/c atenolol, cardizem, HCTZ; start low dose ACE-I and lopressor    Hyperlipidemia - continue Lipitor - LDL 48    Glaucoma - continue Xalatan eyedrops.    Elevated LFTs - viral hepatitis panel negative; RUQ sonogram - hepatomegaly with hepatic steatosis - monitor LFTs.    Analgesia: prn Tylenol    Nutrition: diabetic + cardiac diet    DVT Prophylaxis: SQ heparin     Code Status: full code    DISPO: home    Family Contact: daughter     SUBJECTIVE     [name] states that she feels fine    MEDICATIONS     Current Facility-Administered Medications   Medication Dose Route Frequency   . aspirin  81 mg Oral Daily   . atorvastatin  20 mg Oral Daily   . heparin (porcine)  5,000 Units Subcutaneous Q12H [facility]   . insulin NPH-regular 70-30 MIXTURE  34 Units Subcutaneous Daily before dinner   . insulin NPH-regular 70-30 MIXTURE  72 Units Subcutaneous QAM AC   . latanoprost  1 drop Both Eyes QHS        PHYSICAL EXAM     Filed Vitals:    09/03/15 1231   BP: 172/81   Pulse:    Temp: 98.1 F (36.7 C)   Resp: 18   SpO2: 97%       Temperature: Temp  Min: 97.1 F (36.2 C)  Max: 98.1 F (36.7 C)  Pulse: Pulse  Min: 56  Max: 67  Respiratory: Resp  Min: 18  Max: 20  Non-Invasive BP: BP  Min: 138/61  Max: 172/81  Pulse Oximetry SpO2  Min: 96 %  Max: 99 %    Intake and Output Summary (Last 24 hours) at Date Time    Intake/Output Summary (Last 24 hours) at 09/03/15 1452  Last data filed at 09/03/15 1012   Gross per 24 hour   Intake 1336.67 ml   Output      0 ml   Net 1336.67 ml         GEN APPEARANCE: Normal;  A&OX3  HEENT: PERLA; EOMI; Conjunctiva Clear  NECK: Supple; No bruits  CVS: RRR, S1, S2; No M/G/R  LUNGS: CTAB; No Wheezes; No Rhonchi: No rales  ABD: Soft; No TTP; + Normoactive  BS  EXT: No edema; Pulses 2+ and intact  Skin exam:  Normal Color and Turgor  NEURO: CN 2-12 intact; No Focal neurological deficits  CAP REFILL:  Less than 2 seconds  MENTAL STATUS:  Alert + Oriented x 3    Exam done by Adonis Brook, MD on 09/03/2015 at 2:52 PM      LABS       Recent Labs  Lab 09/03/15  0429 09/02/15  1129   WBC 9.08 11.65*   RBC 3.40* 3.73*   HGB 11.4* 12.3   HEMATOCRIT 33.0* 36.8*   MCV 97.1 98.7   PLATELETS 188 212         Recent Labs  Lab 09/03/15  0429 09/02/15  1720 09/02/15  1129   SODIUM 142 140 133*   POTASSIUM 3.6 4.5 6.5*   CHLORIDE 106 106 102   CO2 28 22 25    BUN 12.0 16.0 23.0*   CREATININE 0.8 1.0 1.6*   GLUCOSE 90 150* 405*   CALCIUM 8.6 9.2 8.5   MAGNESIUM 1.8  --   --          Recent Labs  Lab 09/03/15  0429 09/02/15  1129   ALT 68* 64*   AST (SGOT) 52* 98*   BILIRUBIN, TOTAL 0.4 0.5   ALBUMIN 3.0* 3.2*   ALKALINE PHOSPHATASE 101 116*         Recent Labs  Lab 09/03/15  0429 09/02/15  1720 09/02/15  1129   TROPONIN I 0.03 0.03 0.03             Microbiology Results     None           RADIOLOGY     Upon my review:    US Abdomen Complete    09/02/2015  Borderline hepatomegaly with  hepatic steatosis.  No gallstones. Diffusely thick-walled gallbladder, likely due to contraction. If there is clinical concern surgical status, this can be further evaluated with a HIDA scan. Jonette Pesa, MD 09/02/2015 4:56 PM     Chest Ap Portable    09/02/2015   Chronic lung changes with no acute cardiopulmonary disease. Gaynelle Arabian, MD 09/02/2015 12:35 PM       Signed,  Adonis Brook  2:52 PM 09/03/2015

## 2015-09-04 LAB — CBC AND DIFFERENTIAL
Absolute NRBC: 0 10*3/uL
Basophils Absolute Automated: 0.03 10*3/uL (ref 0.00–0.20)
Basophils Automated: 0.4 %
Eosinophils Absolute Automated: 0.24 10*3/uL (ref 0.00–0.70)
Eosinophils Automated: 3 %
Hematocrit: 34.8 % — ABNORMAL LOW (ref 37.0–47.0)
Hgb: 11.8 g/dL — ABNORMAL LOW (ref 12.0–16.0)
Immature Granulocytes Absolute: 0.03 10*3/uL
Immature Granulocytes: 0.4 %
Lymphocytes Absolute Automated: 2.59 10*3/uL (ref 0.50–4.40)
Lymphocytes Automated: 32.6 %
MCH: 32.9 pg — ABNORMAL HIGH (ref 28.0–32.0)
MCHC: 33.9 g/dL (ref 32.0–36.0)
MCV: 96.9 fL (ref 80.0–100.0)
MPV: 12 fL (ref 9.4–12.3)
Monocytes Absolute Automated: 0.77 10*3/uL (ref 0.00–1.20)
Monocytes: 9.7 %
Neutrophils Absolute: 4.28 10*3/uL (ref 1.80–8.10)
Neutrophils: 53.9 %
Nucleated RBC: 0 /100 WBC (ref 0.0–1.0)
Platelets: 198 10*3/uL (ref 140–400)
RBC: 3.59 10*6/uL — ABNORMAL LOW (ref 4.20–5.40)
RDW: 12 % (ref 12–15)
WBC: 7.94 10*3/uL (ref 3.50–10.80)

## 2015-09-04 LAB — COMPREHENSIVE METABOLIC PANEL
ALT: 57 U/L — ABNORMAL HIGH (ref 0–55)
AST (SGOT): 36 U/L — ABNORMAL HIGH (ref 5–34)
Albumin/Globulin Ratio: 1 (ref 0.9–2.2)
Albumin: 3.1 g/dL — ABNORMAL LOW (ref 3.5–5.0)
Alkaline Phosphatase: 103 U/L (ref 37–106)
Anion Gap: 8 (ref 5.0–15.0)
BUN: 12 mg/dL (ref 7.0–19.0)
Bilirubin, Total: 0.4 mg/dL (ref 0.2–1.2)
CO2: 29 mEq/L (ref 22–29)
Calcium: 8.9 mg/dL (ref 8.5–10.5)
Chloride: 103 mEq/L (ref 100–111)
Creatinine: 0.8 mg/dL (ref 0.6–1.0)
Globulin: 3.2 g/dL (ref 2.0–3.6)
Glucose: 173 mg/dL — ABNORMAL HIGH (ref 70–100)
Potassium: 4 mEq/L (ref 3.5–5.1)
Protein, Total: 6.3 g/dL (ref 6.0–8.3)
Sodium: 140 mEq/L (ref 136–145)

## 2015-09-04 LAB — PHOSPHORUS: Phosphorus: 3.6 mg/dL (ref 2.3–4.7)

## 2015-09-04 LAB — GLUCOSE WHOLE BLOOD - POCT: Whole Blood Glucose POCT: 158 mg/dL — ABNORMAL HIGH (ref 70–100)

## 2015-09-04 LAB — HEMOLYSIS INDEX: Hemolysis Index: 10 (ref 0–18)

## 2015-09-04 LAB — GFR: EGFR: >60

## 2015-09-04 LAB — MAGNESIUM: Magnesium: 1.6 mg/dL (ref 1.6–2.6)

## 2015-09-04 MED ORDER — INSULIN NPH (HUMAN) (ISOPHANE) 100 UNIT/ML SC SUSP
24.0000 [IU] | Freq: Every day | SUBCUTANEOUS | Status: DC
Start: 2015-09-04 — End: 2015-10-21

## 2015-09-04 MED ORDER — INSULIN NPH (HUMAN) (ISOPHANE) 100 UNIT/ML SC SUSP
50.0000 [IU] | Freq: Every morning | SUBCUTANEOUS | Status: DC
Start: 2015-09-04 — End: 2015-10-21

## 2015-09-04 NOTE — Discharge Summary (Signed)
Discharge Date: 09/04/2015     ATTENDING PHYSICIAN:  Ricke Hey, MD     DISCHARGE DIAGNOSES:  1.  Junctional bradycardia, likely due to hyperkalemia.  2.  Hyperkalemia.  3.  Acute kidney injury.  4.  Coronary artery disease.  5.  Diabetes mellitus type 2 with a hemoglobin A1c of 8.4.  6.  Essential hypertension.  7.  Hyperlipidemia.    8.  Glaucoma.  9.  Elevated liver function tests with a right upper quadrant sonogram  showing hepatomegaly and hepatic steatosis.     A 60 year old female with a past medical history of diabetes mellitus type  2, diabetic retinopathy, essential hypertension, hyperlipidemia, obesity,  coronary artery disease, glaucoma, presented with a junctional bradycardia.   Her junctional bradycardia was thought likely due to hyperkalemia.  Her  cardiac enzymes were negative.  Upon correction of her hyperkalemia, her  heart rate remained between 50s and 60s.  We discontinued her beta blockers  and calcium channel blockers.  Her echocardiogram showed an ejection  fraction of 60% and mild concentric hypertrophy.  Again, at this point, we  are holding her beta blockers and calcium channel blockers.     She presented with hyperkalemia with a potassium of 6.5 with peaked  T-waves.  Her hyperkalemia was corrected.  Initially, her ACE inhibitor was  held, but now we resuming it given the fact that she has microalbuminuria.   We are recommending that she get a basic metabolic panel checked in 1 week  through her primary care physician.  She was also found to be in acute  kidney injury, and this was thought prerenal.  Her initial creatinine was  1.6.  With IV fluids, her creatinine normalized.  Her renal sonogram was  unremarkable.  Now that we are initiating lisinopril to control her blood  pressure and for her microalbumin we are recommending that she get a basic  metabolic panel checked in 1 week through her primary care physician.  For  her coronary artery disease, we continued her aspirin and  statins.  For her  diabetes mellitus type 2 which was uncontrolled with a hemoglobin A1c of  8.4, we had to change her NPH 70/30 insulin to NPH insulin.  Initially, her  metformin was kept on hold, but we resumed it upon resolution of her acute  kidney injury.  She is appropriately on aspirin, statins, and ACE  inhibitors.  She was placed on diabetic and cardiac diet.  The change of  NPH 70/30 insulin to NPH insulin was done because of her episodes of  hypoglycemia.     For her essential hypertension, we discontinued atenolol, Cardizem-CD, and  hydrochlorothiazide.  She will be currently on her lisinopril.  For her  hyperlipidemia, we continued Lipitor and her LDL was 48.  For her glaucoma,  we continued Xalatan eyedrops.  She was found to have mildly elevated liver  function tests, and her viral hepatitis panel was negative.  Her right  upper quadrant sonogram showed hepatomegaly with hepatic steatosis.  She  would benefit from having her LFTs monitored periodically.     PHYSICAL EXAMINATION:  GENERAL:  Today, patient denies any complaints.  VITAL SIGNS:  Temperature 97.6, pulse 57 per minute, respiratory rate 18  per minute, blood pressure 178/75, saturating at 97% on room air.  HEART:  S1, S2 heard.  CHEST:  Clear to auscultation bilaterally.  ABDOMEN:  Soft, nontender, nondistended, positive bowel sounds.  EXTREMITIES:  No cyanosis, no clubbing, no edema.  NEUROLOGIC:  She is alert and oriented x3.     At this point, this patient is medically stable for discharge to home on  September 04, 2015.  Her activity is as tolerated.  Her diet is diabetic and  cardiac diet.  She needs to follow with her primary care physician, Dr.  Byrd Hesselbach Castillo-Catoni in 1 week for her medical problems, and at that time,  she should get her basic metabolic panel checked to ensure that her  potassium and creatinine are unremarkable.     DISCHARGE MEDICATIONS:  Aspirin 81 mg orally daily, Lipitor 20 mg orally daily, Tessalon Perles 100  mg  orally by mouth 3 times daily as needed for cough, calcium carbonate  with vitamin D 1 tablet by mouth daily, NPH insulin 50 units before  breakfast and 24 units before dinner, Xalatan 0.005% one drop both eyes at  bedtime, lisinopril 40 mg orally daily, metformin 1000 mg orally twice  daily.      Her atenolol, hydrochlorothiazide, and Cardizem-CD have been discontinued.   Her NPH 70/30 has been changed to NPH insulin.  All these things have been  explained to the patient in great detail.     Time taken for the entire discharge process greater than 30 minutes.           D:  09/04/2015 08:43 AM by Dr. Bobbye Riggs. [place] Shown[place] MD ([id number])  T:  09/04/2015 11:42 AM by NTS      (Conf: [id number]) (Doc ID: [id number])

## 2015-09-04 NOTE — Plan of Care (Signed)
Problem: Diabetes: Glucose Imbalance  Goal: Blood glucose stable at established goal  Outcome: Progressing    09/04/15 0331   Goal/Interventions addressed this shift   Blood glucose stable at established goal  Monitor lab values;Assess for hypoglycemia /hyperglycemia;Monitor/assess vital signs;Coordinate medication administration with meals, as indicated     Patient remains stable and asymptomatic. Metoprolol not given HR 50's. Nitro applied for BP>160. NPH 24 units given with snack.

## 2015-09-04 NOTE — Plan of Care (Signed)
Discharge instructions given and reviewed with patient and patient's daughter.  Teach back utilized and verbalized understanding of follow up care.  Removed IV X 2 with canulas intact.  Removed tele box and returned to monitor room.  Discharged with all personal belongings.

## 2015-09-04 NOTE — Plan of Care (Signed)
Problem: Hemodynamic Status: Cardiac  Goal: Stable vital signs and fluid balance  Outcome: Completed Date Met:  09/04/15    09/04/15 0957   Goal/Interventions addressed this shift   Stable vital signs and fluid balance Monitor/assess vital signs and telemetry per unit protocol;Assess signs and symptoms associated with cardiac rhythm changes         Comments:   Goals met and ready for discharge.

## 2015-09-04 NOTE — Discharge Instructions (Signed)
Deshidratacin (Adulto)  La deshidratacin se produce cuando su cuerpo pierde demasiado lquido. Puede deberse a que ha [employer], ha tenido una diarrea prolongada, Archivist de sudor o una fiebre alta. Tambin puede suceder si usted no bebe suficientes lquidos mientras est enfermo o en un entorno caluroso. Otra causa puede ser el mal uso de pastillas contra la retencin de agua (pastillas diurticas).  Los sntomas incluyen tener sed y [employer]. Tambin es posible que se sienta mareado, dbil, fatigado o muy somnoliento. La dieta que se detalla ms abajo suele ser suficiente para tratar la deshidratacin. En algunos casos, es posible que se necesiten medicamentos.  Cuidados en su casa   Beba al menos 12 vasos de ocho onzas (250 cm3) de lquido al da para Runner, broadcasting/film/video. El lquido puede ser agua, [place] de [place] y [place], [place] de [place], [place] o [place], jugos de frutas claros, reemplazo de Customer service manager y bebidas deportivas, caf y ts sin cafena. Si le diagnosticaron una enfermedad renal, pregunte a su mdico cunto y qu tipos de lquidos debera beber para prevenir la deshidratacin. Si tiene una enfermedad renal, el lquido puede acumularse en su cuerpo. Eso puede ser peligroso para su salud.   Si tiene fiebre, [employer] o la cabeza a causa de un resfriado o la gripe, puede usar acetaminofn o ibuprofeno, a menos que le hayan recetado otro medicamento para esto.Si tiene una enfermedad crnica del hgado o de los riones, o ha tenido alguna vez una lcera de estmago o sangrado gastrointestinal, consulte con su proveedor de atencin mdica antes de tomar estos medicamentos. Las [employer] Pacific de 18 aos no deben tomar aspirina si estn enfermas con fiebre,ya que la aspirina aumenta las probabilidades de causar graves daos al hgado.  Visitas de control  Programe una visita de control con su proveedor de atencin mdica, o segn le hayan indicado.  Cundo debe buscar  atencin mdica  Llame de inmediato a su proveedor de atencin mdica si tiene cualquiera de los siguientes sntomas:   Vmito persistente   Diarrea frecuente (ms de cinco veces al Futures trader); Retail buyer (de color rojizo o negruzco) o mucosidad en la diarrea   Sangre en el vmito o la materia fecal   Hinchazn del abdomen o dolor abdominal que  en aumento   Debilidad, mareo o desmayo   Se siente somnoliento o confundido de una manera inusual   Menor cantidad de [place] o exceso de sed   Grant Ruts de 100.4 F (38 C) o ms  Date Last Reviewed: 07/07/2013   2000-2016 [employer] [address] [place], [place] [address]. Todos los derechos reservados. Esta informacin no pretende sustituir la atencin Actor. Slo su mdico puede diagnosticar y tratar un problema de salud.          Instrucciones de alta para la hiperkalemia  A usted le han diagnosticado hiperkalemia(un nivel alto de potasio en la sangre). El potasio es importante para el funcionamiento de las clulas nerviosas y musculares, incluyendo las clulas del Programmer, multimedia, pero un alto nivel de potasio en la sangre puede causar ritmos cardacos anormales e incluso ataques cardacos.  Cambios en la dieta   Reduzca su consumo de los siguientes alimentos ricos en potasio:   Bananas (evtelas completamente)   Albaricoques (chabacanos) frescos o secos   Naranjas y [place] de naranja   [place], salsa de tomate y [place] de tomate   [place]   Verduras de hojas verdes, como ensaladas verdes, acelgas y Manufacturing systems engineer  Melones (de cualquier tipo)   Chcharos (arvejas)   Frijoles   Papas   Batatas   [place] y [place] de verduras (bien sea hecho en casa o comprado) y mezclas de jugos de verduras   Jugos de frutas  Otros cuidados en la casa   Informe al [employer] todos los medicamentos con o sin receta que est tomando. Algunos medicamentos pueden aumentar los niveles de [place].   Tome los medicamentos exactamente segn las  indicaciones.   Haga que le revisen su nivel de potasio regularmente.   Asista a todas las visitas de control. El mdico deber vigilar de cerca su trastorno.   Aprenda a tomarse el pulso. Si su pulso es inferior a 60latidos por minuto o si es irregular, llame al mdico.  Visitas de control  Programe una visita de control segn le indique el personal mdico.  Cundo debe llamar al mdico  Llame al mdico inmediatamente si presenta cualquiera de los siguientes sntomas:   Journalist, newspaper (llame al 911)   Desmayos (llame al 911)   Falta de aliento o dificultad para respirar (llame al 911 si es grave)   Ritmo cardaco demasiado lento o irregular   Fatiga   Mareos   Aturdimiento   Confusin       Date Last Reviewed: 07/26/2013   2000-2016 [employer] [address] [place], [place] [address]. Todos los derechos reservados. Esta informacin no pretende sustituir la atencin Actor. Slo su mdico puede diagnosticar y tratar un problema de salud.        Discharge Instructions for Acute Kidney Injury  You have been diagnosed with acute kidney injury. This means that your kidneys are not working properly. When both kidneys are healthy, they help filter out fluid and waste from the blood and body.Acute kidney injury has many causes including urinary blockages, infection, lack of enough blood supply, and medications that can injurekidneys. In some cases, acute kidney injury is temporary, lasting several days to a few months, because the kidney has the capacity to repair itself. But, it may also result in chronic kidney disease or end stage renal failure. Here are some instructions for you to follow as you recover.  Home care   Follow any instructions for eating and drinking given to you by your doctor.   Drink less fluid, if instructed by your doctor.   Keep a record of everything you eat and drink.   Measure the amount of urine and stool you have each day.   Weigh yourself  every day, at the same time of day, and in the same kind of clothes. Keep a daily record of your daily weights.   Take your temperature every day. Keep a record of the results.   Learn to take your own blood pressure. Keep a record of your results. Ask your doctor when you should seek emergency medical attention. He or she will tell you which blood pressure reading is dangerous.   Avoid contact with people who have infections (colds, bronchitis, or skin conditions).   Practice good personalhygiene. This is especially important if you have a catheter in place when you leave the hospital. Doing so helps keep you safe from infection.   Take your medications exactly as directed.   You may require frequent blood and urine tests to monitor your kidney function.  Follow-up care  Make a follow-up appointment as directed by our staff.    When to seek medical  care  Call your doctor right away if you have any of the following:   Signs of bladder infection (urinating more often than usual; burning, pain, bleeding, or hesitancy when you urinate)   Signs of infection around your catheter (redness, swelling, warmth, or drainage)   Rapid weight loss or gain, such as 3pounds or more in 24 hours or 6 pounds or more in 7 days   Fever above 100.38F (38.0C) or chills   Muscle aches   Night sweats   Very little or no urine output   Swelling of your hands, legs, or feet   Back pain   Abdominal pain   Extreme tiredness   Date Last Reviewed: 02/12/2013   2000-2016 [employer] [name], [place], [place] [address]. All rights reserved. This information is not intended as a substitute for professional medical care. Always follow your healthcare professional's instructions.

## 2015-09-04 NOTE — Progress Notes (Signed)
Pt to discharge home with no needs and family to transport.    [name],MSW  Case Management Care Coordinator  Ext. [phone]     09/04/15 0957   Discharge Disposition   Patient preference/choice provided? Yes   Physical Discharge Disposition Home, No Needs   Mode of Transportation Car   Patient/Family/POA notified of transfer plan Yes   Patient agreeable to discharge plan/expected d/c date? Yes   Family/POA agreeable to discharge plan/expected d/c date? Yes   Bedside nurse notified of transport plan? Yes   CM Interventions   Follow up appointment scheduled? Yes   Multidisciplinary rounds/family meeting before d/c? No   Medicare Checklist   Is this a Medicare patient? No

## 2015-09-07 ENCOUNTER — Telehealth (INDEPENDENT_AMBULATORY_CARE_PROVIDER_SITE_OTHER): Payer: Self-pay

## 2015-09-07 NOTE — Telephone Encounter (Signed)
Post Acute Discharge Call    Hospital / ED Discharge Date: [facility] 09/04/15  Primary Discharge Dx: Acute renal failure  Secondary Dx:   Hospital / ED Discharge Appt with PCP: 09/21/15    LPN 2 placed call to patient to follow up on recent hospital discharge, assess current status, address questions/concerns regarding discharge instructions / medications and encourage patient to schedule Hospital Discharge follow up appt with PCP; No Answer; LVMM with contact information and request for return call. Will continue to follow.

## 2015-09-08 ENCOUNTER — Telehealth (INDEPENDENT_AMBULATORY_CARE_PROVIDER_SITE_OTHER): Payer: Self-pay

## 2015-09-08 NOTE — Telephone Encounter (Signed)
Post Acute Discharge Call    Hospital / ED Discharge Date: [facility] 09/04/15  Primary Discharge Dx: Acute renal failure  Secondary Dx:   Hospital / ED Discharge Appt with PCP: 09/22/15    LPN 2 placed 2 calls to patient to follow up on recent hospital discharge, assess current status, address questions/concerns regarding discharge instructions / medications and encourage patient to schedule Hospital Discharge follow up appt with PCP; No Answer; LVMM with contact information and request for return call.

## 2015-09-14 ENCOUNTER — Encounter (INDEPENDENT_AMBULATORY_CARE_PROVIDER_SITE_OTHER): Payer: Self-pay | Admitting: Internal Medicine

## 2015-09-18 ENCOUNTER — Ambulatory Visit (INDEPENDENT_AMBULATORY_CARE_PROVIDER_SITE_OTHER): Payer: HMO | Admitting: Internal Medicine

## 2015-09-18 ENCOUNTER — Encounter (INDEPENDENT_AMBULATORY_CARE_PROVIDER_SITE_OTHER): Payer: Self-pay | Admitting: Internal Medicine

## 2015-09-18 VITALS — BP 139/67 | HR 86 | Temp 98.0°F | Resp 18 | Wt 191.0 lb

## 2015-09-18 DIAGNOSIS — E669 Obesity, unspecified: Secondary | ICD-10-CM

## 2015-09-18 DIAGNOSIS — Z09 Encounter for follow-up examination after completed treatment for conditions other than malignant neoplasm: Secondary | ICD-10-CM

## 2015-09-18 DIAGNOSIS — E119 Type 2 diabetes mellitus without complications: Secondary | ICD-10-CM

## 2015-09-18 DIAGNOSIS — I10 Essential (primary) hypertension: Secondary | ICD-10-CM

## 2015-09-18 DIAGNOSIS — E1169 Type 2 diabetes mellitus with other specified complication: Secondary | ICD-10-CM

## 2015-09-18 NOTE — Progress Notes (Signed)
Nursing Documentation:  Limb alert status: Patient asked and denied any limb restrictions for blood pressure/blood draws.  Has the patient seen any other providers since their last visit: no  The patient is due for foot exam, Shingles and [facility]

## 2015-09-18 NOTE — Progress Notes (Signed)
Subjective:      Patient ID: [name] is a 60 y.o. female     Chief Complaint   Patient presents with   . Hospital D/C F/U        HPI   Patient is a 60 year old Hispanic female with history of obesity, diabetes mellitus 2, that has been uncontrolled, hypertension, hyperlipidemia,among others, who presents today for hospital discharge follow-up.    Patient was admitted to [facility] from July 26 until July 28 after presenting with complaints of lightheadedness.  EMS was called and she was found to be bradycardic. Upon evaluation by EMS, she was found to have junctional bradycardia  Blood work showed hyperkalemia which was thought to be the cause of the bradycardia.  He was thought to be in acute kidney injury thought secondary to dehydration.  Her hyperkalemia was corrected.  aCE inhibitor was held initially and atenolol and diltiazem were discontinued.  During her admission, the acute kidney injury resolved and she was restarted on lisinopril 40mg  daily.  She did experience some hypoglycemia during her hospital stay and, upon discharge, the dose of NPH insulin was decreased to 50 units with breakfast and 23 units daily with dinner.    Since hospital discharge, patient reports she has been feeling fairly well.  He has not had any further significant episodes of lightheadedness, but admits that she feels occasionally slightly lightheaded, which she attributes to not drinking much water and working in a warm environment.    Since discharge from the hospital, patient has been seen by her cardiologist, Dr. Fransisco Beau, and was started on amlodipine 5 mg daily.  She reports she has been checking her blood pressures at home and these have been around 130's-140's/70'smmHg.  Patient denies any frequent headaches, chest pain, palpitations, shortness of breath, orthopnea or PND.  Of note, patient reports that she had repeat labs done recently ordered by her cardiologist, with normal potassium levels, per patient.    She also reports  she has been checking her glucose at home and states that in the mornings, that has been around 110s 2 hours after lunch and dinner.  She reports that they are around 170s.    She is now back to work and has been doing well with this, per patient.    Reports she has a follow-up appointment with her cardiologist in 1 month for hypertension.    The following portions of the patient's history were reviewed and updated as appropriate: allergies, current medications, past medical history, past social history and problem list.     Past Medical History   Diagnosis Date   . Glaucoma 06/01/2012   . Diabetes mellitus type 2 in obese    . Hypertension    . Hyperlipidemia    . Obesity    . Diabetic retinopathy 06/28/2013     Of the right eye, per patient   . Venous insufficiency 09/17/2014     Followed at [facility].  Wears compression stockings.        Social History     Social History   . Marital Status: Single     Spouse Name: N/A   . Number of Children: 3   . Years of Education: N/A     Occupational History   . Maintenance      Social History Main Topics   . Smoking status: Never Smoker    . Smokeless tobacco: Never Used   . Alcohol Use: No   . Drug Use:  No   . Sexual Activity: Not Currently     Other Topics Concern   . Not on file     Social History Narrative    Originally from [place].  Lives with her daughter.    Exercise: walks frequently       Allergies   Allergen Reactions   . Shellfish-Derived Products        Current Outpatient Prescriptions   Medication Sig Dispense Refill   . amLODIPine (NORVASC) 5 MG tablet Take 5 mg by mouth daily.     Marland Kitchen aspirin 81 MG tablet Take 81 mg by mouth daily.       Marland Kitchen atorvastatin (LIPITOR) 20 MG tablet Take 1 tablet (20 mg total) by mouth daily. 90 tablet 1   . benzonatate (TESSALON PERLES) 100 MG capsule Take 1 capsule (100 mg total) by mouth 3 (three) times daily as needed. 30 capsule 0   . Calcium Carbonate-Vitamin D (CALCIUM-D PO) Take 1 tablet by mouth daily.     .  insulin NPH (HUMULIN,NOVOLIN) 100 UNIT/ML injection Inject 24 Units into the skin daily with dinner. 10 mL ML   . insulin NPH (HUMULIN,NOVOLIN) 100 UNIT/ML injection Inject 50 Units into the skin every morning before breakfast. 10 mL 0   . latanoprost (XALATAN) 0.005 % ophthalmic solution Place 1 drop into both eyes nightly.        Marland Kitchen lisinopril (PRINIVIL,ZESTRIL) 40 MG tablet Take 1 tablet (40 mg total) by mouth daily. 90 tablet 1   . metFORMIN (GLUCOPHAGE) 1000 MG tablet Take 1 tablet (1,000 mg total) by mouth 2 (two) times daily with meals. 180 tablet 1     No current facility-administered medications for this visit.       Review of Systems   Constitutional: Positive for fatigue (improving).   Respiratory: Negative for chest tightness and shortness of breath.    Cardiovascular: Negative for chest pain and palpitations.   Gastrointestinal: Negative for nausea, vomiting, abdominal pain, diarrhea and constipation.   Endocrine: Negative for polydipsia and polyuria.   Musculoskeletal: Negative for myalgias.   Neurological: Negative for dizziness, light-headedness and headaches.      Objective:     Physical Exam   Constitutional: She is oriented to person, place, and time. She appears well-developed. No distress.   obese   HENT:   Moist mucous membranes   Eyes: Conjunctivae are normal. Pupils are equal, round, and reactive to light. Right eye exhibits no discharge. Left eye exhibits no discharge. No scleral icterus.   Neck: Neck supple.   Cardiovascular: Normal rate and regular rhythm.  Exam reveals no gallop and no friction rub.    No murmur heard.  Pulmonary/Chest: Effort normal. No respiratory distress. She has no wheezes. She has no rales.   Abdominal: Soft. She exhibits no mass. There is no tenderness. There is no rebound and no guarding.   Musculoskeletal: She exhibits no edema.   Neurological: She is alert and oriented to person, place, and time.   Skin: Skin is warm and dry. She is not diaphoretic.    Psychiatric: Her behavior is normal.   Vitals reviewed.        BP 139/67 mmHg  Pulse 86  Temp(Src) 98 F (36.7 C) (Oral)  Resp 18  Wt 86.637 kg (191 lb)    Lab Results   Component Value Date    HGBA1C 8.4* 09/03/2015     Lab Results   Component Value Date    WBC 7.94 09/04/2015  HGB 11.8* 09/04/2015    HCT 34.8* 09/04/2015    MCV 96.9 09/04/2015    PLT 198 09/04/2015     '  Chemistry        Component Value Date/Time    NA 140 09/04/2015 0319    K 4.0 09/04/2015 0319    CL 103 09/04/2015 0319    CL 104 02/15/2014 0733    CO2 29 09/04/2015 0319    BUN 12.0 09/04/2015 0319    CREAT 0.8 09/04/2015 0319    CREAT 0.64 06/22/2015 0000    CREAT 0.78 02/15/2014 0733    GLU 173* 09/04/2015 0319    GLU 237* 06/22/2015 0000        Component Value Date/Time    CA 8.9 09/04/2015 0319    ALKPHOS 103 09/04/2015 0319    AST 36* 09/04/2015 0319    ALT 57* 09/04/2015 0319    BILITOTAL 0.4 09/04/2015 0319           Lab Results   Component Value Date    CHOL 119 09/03/2015    CHOL 144 12/18/2014    CHOL 119 05/16/2014     Lab Results   Component Value Date    HDL 43 09/03/2015    HDL 68 12/18/2014    HDL 66 05/16/2014     Lab Results   Component Value Date    LDL 48 09/03/2015    LDL 56 12/18/2014    LDL 33 05/16/2014     Lab Results   Component Value Date    TRIG 140 09/03/2015    TRIG 102 12/18/2014    TRIG 98 05/16/2014     Lab Results   Component Value Date    TSH 1.340 12/18/2014    TSH 1.17 06/28/2013    TSH 0.58 02/17/2011       Assessment:     1. Hospital discharge follow-up    2. Essential hypertension    3. Diabetes mellitus type 2 in obese       Plan:     Reviewed recent hospital discharge note, lab results and imaging with patient at length.    Her blood pressure appears to be borderline today, with goal of under 130/80 mmHg, however, she prefers to continue to follow-up with her cardiologist regarding this.  She will continue current lisinopril 40 mg daily and also amlodipine 5 mg daily.  Encouraged her to monitor  blood pressures at home 3-4 days a week, with goal as above.    It appears her blood glucose has also been fairly well-controlled at home.  She has an upcoming appointment with an endocrinologist.  She does not recall the name of the endocrinologist.  For now, she will continue current insulin regimen.  We have encouraged her to check her blood glucose 2-3 times a day, with goal fasting glucose of under 120.    We have encouraged her to have records from her new endocrinologist sent to our office.    She will return to in November 2016 for an annual wellness exam or sooner as needed.  Standley Dakins, MD

## 2015-09-22 ENCOUNTER — Ambulatory Visit (INDEPENDENT_AMBULATORY_CARE_PROVIDER_SITE_OTHER): Payer: HMO | Admitting: Internal Medicine

## 2015-09-28 ENCOUNTER — Encounter (INDEPENDENT_AMBULATORY_CARE_PROVIDER_SITE_OTHER): Payer: Self-pay | Admitting: Internal Medicine

## 2015-09-29 ENCOUNTER — Encounter (INDEPENDENT_AMBULATORY_CARE_PROVIDER_SITE_OTHER): Payer: Self-pay | Admitting: Internal Medicine

## 2015-10-14 ENCOUNTER — Encounter (INDEPENDENT_AMBULATORY_CARE_PROVIDER_SITE_OTHER): Payer: Self-pay | Admitting: Internal Medicine

## 2015-10-15 ENCOUNTER — Encounter (INDEPENDENT_AMBULATORY_CARE_PROVIDER_SITE_OTHER): Payer: Self-pay | Admitting: Internal Medicine

## 2015-10-21 ENCOUNTER — Other Ambulatory Visit (INDEPENDENT_AMBULATORY_CARE_PROVIDER_SITE_OTHER): Payer: Self-pay | Admitting: Internal Medicine

## 2015-10-21 ENCOUNTER — Telehealth (INDEPENDENT_AMBULATORY_CARE_PROVIDER_SITE_OTHER): Payer: Self-pay | Admitting: Internal Medicine

## 2015-10-21 MED ORDER — INSULIN NPH (HUMAN) (ISOPHANE) 100 UNIT/ML SC SUSP
SUBCUTANEOUS | 2 refills | Status: DC
Start: 2015-10-21 — End: 2015-11-24

## 2015-10-21 MED ORDER — INSULIN NPH (HUMAN) (ISOPHANE) 100 UNIT/ML SC SUSP
SUBCUTANEOUS | 2 refills | Status: DC
Start: 2015-10-21 — End: 2015-10-21

## 2015-10-21 NOTE — Telephone Encounter (Signed)
RX for NPH insulin has been sent to pharmacy on file.  Please notify patient (Spanish speaking).  Thanks.

## 2015-10-21 NOTE — Telephone Encounter (Signed)
Pt calling in regards to  Medication refill insulin NPH (HUMULIN,NOVOLIN) 100 UNIT/ML injection. Pt can be reached at [phone]    Send to :  [facility] [place], [place] - [address]  [phone] (Phone)  [phone] (Fax)    Patient's sister called to request insulin 70/30

## 2015-10-21 NOTE — Telephone Encounter (Signed)
Please see below.

## 2015-10-29 ENCOUNTER — Encounter (INDEPENDENT_AMBULATORY_CARE_PROVIDER_SITE_OTHER): Payer: Self-pay | Admitting: Internal Medicine

## 2015-11-24 ENCOUNTER — Encounter (INDEPENDENT_AMBULATORY_CARE_PROVIDER_SITE_OTHER): Payer: Self-pay | Admitting: Internal Medicine

## 2015-11-24 ENCOUNTER — Ambulatory Visit (INDEPENDENT_AMBULATORY_CARE_PROVIDER_SITE_OTHER): Payer: HMO | Admitting: Internal Medicine

## 2015-11-24 VITALS — BP 140/70 | HR 71 | Temp 98.0°F | Wt 193.8 lb

## 2015-11-24 DIAGNOSIS — I10 Essential (primary) hypertension: Secondary | ICD-10-CM

## 2015-11-24 DIAGNOSIS — E1165 Type 2 diabetes mellitus with hyperglycemia: Secondary | ICD-10-CM

## 2015-11-24 DIAGNOSIS — E785 Hyperlipidemia, unspecified: Secondary | ICD-10-CM

## 2015-11-24 DIAGNOSIS — Z23 Encounter for immunization: Secondary | ICD-10-CM

## 2015-11-24 DIAGNOSIS — Z794 Long term (current) use of insulin: Secondary | ICD-10-CM

## 2015-11-24 DIAGNOSIS — E118 Type 2 diabetes mellitus with unspecified complications: Secondary | ICD-10-CM

## 2015-11-24 DIAGNOSIS — IMO0002 Reserved for concepts with insufficient information to code with codable children: Secondary | ICD-10-CM

## 2015-11-24 MED ORDER — INSULIN NPH (HUMAN) (ISOPHANE) 100 UNIT/ML SC SUSP
SUBCUTANEOUS | 0 refills | Status: DC
Start: 2015-11-24 — End: 2016-01-27

## 2015-11-24 NOTE — Progress Notes (Signed)
Subjective:      Patient ID: ECE CUMBERLAND is a 60 y.o. female     Chief Complaint   Patient presents with   . Diabetes Mellitus   . Hypertension        HPI   Here today for f/u of the above.     Reports compliance w/ all meds.     Has not made appointment w/ endocrinologist.  Checking glucose in am, 110's-120's, pm in 170's-180's.  Does not bring in meter.  States she has been walking regularly.  Denies polydipsia, polyuria, paresthesias. Has gained 2 lbs since last visit.     Patient denies any frequent headaches, chest pain, palpitations, shortness of breath, orthopnea or PND.      The following portions of the patient's history were reviewed and updated as appropriate: allergies, current medications, past medical history, past social history and problem list.     Past Medical History:   Diagnosis Date   . Diabetes mellitus type 2 in obese    . Diabetic retinopathy 06/28/2013    Of the right eye, per patient   . Glaucoma 06/01/2012   . Hyperlipidemia    . Hypertension    . Obesity    . Venous insufficiency 09/17/2014    Followed at [facility].  Wears compression stockings.        Social History     Social History   . Marital status: Single     Spouse name: N/A   . Number of children: 3   . 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 Smoking status: Never Smoker   . Smokeless tobacco: Never Used   . Alcohol use No   . Drug use: No   . Sexual activity: Not Currently     Other Topics Concern   . Not on file     Social History Narrative    Originally from [place].  Lives with her daughter.    Exercise: walks frequently       Allergies   Allergen Reactions   . Shellfish-Derived Products        Current Outpatient Prescriptions   Medication Sig Dispense Refill   . amLODIPine (NORVASC) 5 MG tablet Take 5 mg by mouth daily.     Marland Kitchen aspirin 81 MG tablet Take 81 mg by mouth daily.       Marland Kitchen atorvastatin (LIPITOR) 20 MG tablet Take 1 tablet (20 mg  total) by mouth daily. 90 tablet 1   . Calcium Carbonate-Vitamin D (CALCIUM-D PO) Take 1 tablet by mouth daily.     . insulin NPH (HUMULIN,NOVOLIN) 100 UNIT/ML injection Administer 50 units subcutaneously with breakfast and 24 units with dinner 80 mL 0   . latanoprost (XALATAN) 0.005 % ophthalmic solution Place 1 drop into both eyes nightly.        Marland Kitchen lisinopril (PRINIVIL,ZESTRIL) 40 MG tablet Take 1 tablet (40 mg total) by mouth daily. 90 tablet 1   . metFORMIN (GLUCOPHAGE) 1000 MG tablet Take 1 tablet (1,000 mg total) by mouth 2 (two) times daily with meals. 180 tablet 1     No current facility-administered medications for this visit.        Review of Systems   Constitutional: Negative for fatigue.   Respiratory: Negative for chest tightness and shortness of breath.    Cardiovascular: Negative for chest pain and palpitations.  Gastrointestinal: Negative for abdominal pain, constipation, diarrhea, nausea and vomiting.   Endocrine: Negative for polydipsia and polyuria.   Musculoskeletal: Negative for myalgias.   Neurological: Negative for dizziness, light-headedness and headaches.            Objective:     Physical Exam   Constitutional: She is oriented to person, place, and time. She appears well-developed. No distress.   obese   HENT:   Moist mucous membranes   Eyes: Conjunctivae are normal. Pupils are equal, round, and reactive to light. Right eye exhibits no discharge. Left eye exhibits no discharge. No scleral icterus.   Neck: Neck supple.   Cardiovascular: Normal rate and regular rhythm.  Exam reveals no gallop and no friction rub.    No murmur heard.  Pulmonary/Chest: Effort normal. No respiratory distress. She has no wheezes. She has no rales.   Abdominal: Soft. She exhibits no mass. There is no tenderness. There is no rebound and no guarding.   Musculoskeletal: Edema: trace bilateral lower extremity edema.   Neurological: She is alert and oriented to person, place, and time.   Skin: Skin is warm and dry.  She is not diaphoretic.   Psychiatric: Her behavior is normal.   Vitals reviewed.     BP 140/70 (BP Site: Left arm, Patient Position: Sitting, Cuff Size: Large)   Pulse 71   Temp 98 F (36.7 C) (Oral)   Wt 87.9 kg (193 lb 12.8 oz)   BMI 31.28 kg/m     Lab Results   Component Value Date    HGBA1C 8.4 (H) 09/03/2015     Lab Results   Component Value Date    WBC 7.94 09/04/2015    HGB 11.8 (L) 09/04/2015    HCT 34.8 (L) 09/04/2015    MCV 96.9 09/04/2015    PLT 198 09/04/2015     '  Chemistry        Component Value Date/Time    NA 140 09/04/2015 0319    K 4.0 09/04/2015 0319    CL 103 09/04/2015 0319    CL 104 02/15/2014 0733    CO2 29 09/04/2015 0319    BUN 12.0 09/04/2015 0319    CREAT 0.8 09/04/2015 0319    CREAT 0.64 06/22/2015 0000    CREAT 0.78 02/15/2014 0733    GLU 173 (H) 09/04/2015 0319        Component Value Date/Time    CA 8.9 09/04/2015 0319    ALKPHOS 103 09/04/2015 0319    AST 36 (H) 09/04/2015 0319    ALT 57 (H) 09/04/2015 0319    BILITOTAL 0.4 09/04/2015 0319          Lab Results   Component Value Date    CHOL 119 09/03/2015    CHOL 144 12/18/2014    CHOL 119 05/16/2014     Lab Results   Component Value Date    HDL 43 09/03/2015    HDL 68 12/18/2014    HDL 66 05/16/2014     Lab Results   Component Value Date    LDL 48 09/03/2015    LDL 56 12/18/2014    LDL 33 05/16/2014     Lab Results   Component Value Date    TRIG 140 09/03/2015    TRIG 102 12/18/2014    TRIG 98 05/16/2014     Lab Results   Component Value Date    TSH 1.340 12/18/2014    TSH 1.17 06/28/2013  Assessment:     1. Uncontrolled type 2 diabetes mellitus with complication, with long-term current use of insulin  - Uncontrolled.   - Adjust insulin as below.   - encourage seeing endocrinologist, referral given again.   - emphasized lifestyle changes  - insulin NPH (HUMULIN,NOVOLIN) 100 UNIT/ML injection; Administer 50 units subcutaneously with breakfast and 24 units with dinner  Dispense: 80 mL; Refill: 0  - Hemoglobin A1C;  Future  - Comprehensive Metabolic Panel; Future  - Ambulatory referral to Endocrinology    2. Essential hypertension  - borderline control, w/ goal <130/84mmHg.   - encourage patient monitoring at home and bring in log to next visit  - continue current meds for now  - Comprehensive Metabolic Panel; Future    3. Dyslipidemia  - well-controlled on lipid panel done in July  - continue current Lipitor   - Comprehensive Metabolic Panel; Future    4. Need for influenza vaccination  - Flu vacc QUAD PRES FREE 3 YRS & GREATER; Future         Plan:     Plan is as above.     RTC in 3 months for f/u hypertension and hyperlipidemia, sooner prn.     Standley Dakins, MD

## 2015-11-24 NOTE — Progress Notes (Signed)
Nursing Documentation:  Limb alert status: Patient asked and denied any limb restrictions for blood pressure/blood draws.  Has the patient seen any other providers since their last visit: no  The patient is due for shingles vaccine, [facility], foot exam and influenza vacc.

## 2016-01-05 ENCOUNTER — Other Ambulatory Visit (INDEPENDENT_AMBULATORY_CARE_PROVIDER_SITE_OTHER): Payer: Self-pay | Admitting: Internal Medicine

## 2016-01-05 DIAGNOSIS — IMO0002 Reserved for concepts with insufficient information to code with codable children: Secondary | ICD-10-CM

## 2016-01-27 ENCOUNTER — Other Ambulatory Visit (INDEPENDENT_AMBULATORY_CARE_PROVIDER_SITE_OTHER): Payer: Self-pay

## 2016-01-27 DIAGNOSIS — IMO0002 Reserved for concepts with insufficient information to code with codable children: Secondary | ICD-10-CM

## 2016-01-28 MED ORDER — INSULIN NPH (HUMAN) (ISOPHANE) 100 UNIT/ML SC SUSP
SUBCUTANEOUS | 0 refills | Status: DC
Start: 2016-01-28 — End: 2016-02-22

## 2016-01-29 ENCOUNTER — Other Ambulatory Visit (INDEPENDENT_AMBULATORY_CARE_PROVIDER_SITE_OTHER): Payer: Self-pay | Admitting: Internal Medicine

## 2016-02-22 ENCOUNTER — Other Ambulatory Visit: Payer: HMO

## 2016-02-22 ENCOUNTER — Encounter (INDEPENDENT_AMBULATORY_CARE_PROVIDER_SITE_OTHER): Payer: Self-pay | Admitting: Internal Medicine

## 2016-02-22 ENCOUNTER — Ambulatory Visit (INDEPENDENT_AMBULATORY_CARE_PROVIDER_SITE_OTHER): Payer: HMO | Admitting: Internal Medicine

## 2016-02-22 VITALS — BP 134/82 | HR 99 | Temp 97.3°F | Wt 184.0 lb

## 2016-02-22 DIAGNOSIS — E1165 Type 2 diabetes mellitus with hyperglycemia: Secondary | ICD-10-CM

## 2016-02-22 DIAGNOSIS — I10 Essential (primary) hypertension: Secondary | ICD-10-CM

## 2016-02-22 DIAGNOSIS — IMO0002 Reserved for concepts with insufficient information to code with codable children: Secondary | ICD-10-CM

## 2016-02-22 DIAGNOSIS — R05 Cough: Secondary | ICD-10-CM

## 2016-02-22 DIAGNOSIS — Z794 Long term (current) use of insulin: Secondary | ICD-10-CM

## 2016-02-22 DIAGNOSIS — E118 Type 2 diabetes mellitus with unspecified complications: Secondary | ICD-10-CM

## 2016-02-22 DIAGNOSIS — J069 Acute upper respiratory infection, unspecified: Secondary | ICD-10-CM

## 2016-02-22 DIAGNOSIS — E782 Mixed hyperlipidemia: Secondary | ICD-10-CM

## 2016-02-22 DIAGNOSIS — R058 Other specified cough: Secondary | ICD-10-CM

## 2016-02-22 MED ORDER — INSULIN NPH (HUMAN) (ISOPHANE) 100 UNIT/ML SC SUSP
SUBCUTANEOUS | 0 refills | Status: DC
Start: 2016-02-22 — End: 2016-05-23

## 2016-02-22 MED ORDER — AZITHROMYCIN 250 MG PO TABS
ORAL_TABLET | ORAL | 0 refills | Status: AC
Start: 2016-02-22 — End: 2016-02-27

## 2016-02-22 MED ORDER — ATORVASTATIN CALCIUM 20 MG PO TABS
20.0000 mg | ORAL_TABLET | Freq: Every day | ORAL | 1 refills | Status: DC
Start: 2016-02-22 — End: 2016-05-23

## 2016-02-22 MED ORDER — GUAIFENESIN-CODEINE 100-10 MG/5ML PO SYRP
5.0000 mL | ORAL_SOLUTION | Freq: Three times a day (TID) | ORAL | 0 refills | Status: DC | PRN
Start: 2016-02-22 — End: 2016-05-23

## 2016-02-22 MED ORDER — LISINOPRIL 40 MG PO TABS
40.0000 mg | ORAL_TABLET | Freq: Every day | ORAL | 1 refills | Status: DC
Start: 2016-02-22 — End: 2016-08-23

## 2016-02-22 MED ORDER — METFORMIN HCL 1000 MG PO TABS
1000.0000 mg | ORAL_TABLET | Freq: Two times a day (BID) | ORAL | 0 refills | Status: DC
Start: 2016-02-22 — End: 2016-05-23

## 2016-02-22 NOTE — Patient Instructions (Signed)
Azithromycin tablets  Brand Names: Zithromax, Zithromax Tri-Pak, Zithromax Z-Pak  Qu es este medicamento?  La AZITROMICINA es un antibitico macrlido. Se utiliza para tratar o prevenir ciertos tipos de infecciones bacterianas. No es efectivo para resfros, gripe u otras infecciones de origen viral.  Cmo debo utilizar este medicamento?  Tome este medicamento por [place] oral con un vaso lleno de agua. Siga las instrucciones de la etiqueta del [place]. Las tabletas se pueden tomar con alimentos o con el estmago vaco. Si el medicamento le produce Programme researcher, broadcasting/film/video, tmelo con alimentos. Tome sus dosis a intervalos regulares. No tome su medicamento con una frecuencia mayor que la indicada. Tome todas las dosis de su medicamento como se le haya indicado aun si se siente mejor. No omita ninguna dosis o suspenda el uso de su medicamento antes de lo indicado.  Hable con su pediatra para informarse acerca del uso de este medicamento en nios. Puede requerir atencin especial.  Qu efectos secundarios puedo tener al [employer] este medicamento?  Efectos secundarios que debe informar a su mdico o a Producer, television/film/video de la salud tan pronto como sea posible: Therapist, art, como erupcin cutnea, picazn o urticarias, e hinchazn de la cara, los labios o la lengua confusin, pesadillas o alucinaciones orina oscura dificultad al respirar prdida de la audicin ritmo cardiaco irregular o dolor en el pecho dolor o dificultad para Recruitment consultant, formacin de ampollas, descamacin o distensin de la piel, incluso dentro de la boca manchas blancas o llagas en la boca color amarillento de los ojos o la pielEfectos secundarios que generalmente no requieren atencin mdica (infrmelos a su mdico o a Producer, television/film/video de la salud si persisten o si son molestos): diarrea mareos, somnolencia dolor de Secretary/administrator o vmitos decoloracin de los dientes irritacin vaginal  Qu puede interactuar con este  medicamento?  No tome esta medicina con ninguno de los siguientes medicamentos:   lincomicina  Esta medicina tambin puede interactuar con los siguientes medicamentos:   amiodarona   anticidos   pldoras anticonceptivas   ciclosporina   digoxina   magnesio   nelfinavir   fenitona   warfarina  Qu sucede si me olvido de una dosis?  Si olvida una dosis, tmela lo antes posible. Si es casi la hora de la prxima dosis, tome slo esa dosis. No tome dosis adicionales o dobles.  Dnde debo guardar mi medicina?  Mantngala fuera del alcance de los nios.  Gurdela a [employer], entre 15 y 30 grados C (14 y 84 grados F). Deseche todo el medicamento que no haya utilizado, despus de la fecha de vencimiento.  Qu le debo informar a mi profesional de la salud antes de tomar este medicamento?  Necesita saber si usted presenta alguno de los [employer] o situaciones:   enfermedad renal   enfermedad heptica   enfermedad cardaca o latido cardaco irregular   una reaccin alrgica o inusual a la azitromicina, a la eritromicina, otros antibiticos macrlidos, alimentos, colorantes o conservadores   si est embarazada o buscando quedar embarazada   si est amamantando a un beb  A qu debo estar atento al usar [employer]?  Si los sntomas no mejoran, consulte con su mdico o con su profesional de [employer].  No trate la diarrea con productos de [employer]. Comunquese con su mdico si tiene diarrea que dura ms de 2 das o si es severa y [place].  Este medicamento puede aumentar la sensibilidad al sol. Mantngase fuera de la luz  solar. Si no lo puede evitar, utilice ropa protectora y crema de Orthoptist. No utilice lmparas solares, camas solares ni cabinas solares.   2000-2017 [employer], [place]. [address] [place], [place] [address]. Todos los derechos reservados. Esta informacin no pretende sustituir la atencin Actor. Slo su mdico puede diagnosticar  y tratar un problema de salud.

## 2016-02-22 NOTE — Progress Notes (Signed)
Nursing Documentation:  Limb alert status: Patient asked and denied any limb restrictions for blood pressure/blood draws.  Has the patient seen any other providers since their last visit: no  The patient is due for foot exam, shingles vaccine and [facility].

## 2016-02-22 NOTE — Progress Notes (Signed)
Subjective:      Patient ID: [name] is a 61 y.o. female     Chief Complaint   Patient presents with   . Cough     onset 3 weeks, dry        HPI   Patient is a 61 year old Hispanic female with history of obesity, hypertension, hyperlipidemia, uncontrolled diabetes mellitus 2, among others, who presents today for evaluation of a cough.  She also reports that she wants to have labs to follow-up on diabetes and hyperlipidemia.    Patient reports that for the last 3 weeks or so she has been having a cough that is mostly dry.  She does also admit some slight nasal congestion, rhinorrhea and also bilateral ear pressure.  She previously had a sore throat, but reports this has resolved.  She denies any fevers, chills, night sweats, bodyaches, shortness of breath, wheezing, nausea, vomiting, diarrhea or rashes.  Denies any sick contacts outside of her sister who was recently diagnosed with influenza.  The patient herself has not had any flulike symptoms.  She had an old prescription for [employer] which she has tried without much improvement.    Regarding diabetes, her last him a globe an A1c was done in July and was 8.4 percent.  Patient was advised to see an endocrinologist for management of diabetes but has not yet done so.  She claims she has an appointment with an endocrinologist through the [facility] system but I do not see a scheduled appointment.  She reports compliance with metformin 1000 g 2 times a day and also NPH insulin 50 units with breakfast and 24 units with dinner.  She claims she has been checking her glucose, but does not know the readings and also does not bring in her machine.  Denies any polydipsia, polyuria, or paresthesias.    Regarding hypertension, she reports she checks her blood pressures at home and states these are usually in the 120s-130s systolic.  She reports compliance with amlodipine 5 mg daily and lisinopril 40 mg daily.Patient denies any frequent headaches, chest pain,  palpitations, shortness of breath, orthopnea or PND.      She does have a history of hyperlipidemia and reports compliance with Lipitor 40 mg daily.  Last lipid panel was done in July and showed acceptable control.  Denies any frequent myalgias or abdominal pain.    The following sections were reviewed this encounter by the provider: Meds          Past Medical History:   Diagnosis Date   . Diabetes mellitus type 2 in obese    . Diabetic retinopathy 06/28/2013    Of the right eye, per patient   . Glaucoma 06/01/2012   . Hyperlipidemia    . Hypertension    . Obesity    . Venous insufficiency 09/17/2014    Followed at [facility].  Wears compression stockings.        Social History     Social History   . Marital status: Single     Spouse name: N/A   . Number of children: 3   . 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 Smoking status: Never Smoker   . Smokeless tobacco: Never Used   . Alcohol use No   . Drug use: No   . Sexual activity: Not Currently     Other Topics  Concern   . Not on file     Social History Narrative    Originally from [place].  Lives with her daughter.    Exercise: walks frequently       Allergies   Allergen Reactions   . Shellfish-Derived Products        Current Outpatient Prescriptions   Medication Sig Dispense Refill   . amLODIPine (NORVASC) 5 MG tablet Take 5 mg by mouth daily.     Marland Kitchen aspirin 81 MG tablet Take 81 mg by mouth daily.       Marland Kitchen atorvastatin (LIPITOR) 20 MG tablet Take 1 tablet (20 mg total) by mouth daily. 90 tablet 1   . B-D INS SYR ULTRAFINE 1CC/31G 31G X 5/16" 1 ML Misc USE SYRINGE FOR INSULIN INJECTION 2 TIMES A DAY 100 each 1   . Calcium Carbonate-Vitamin D (CALCIUM-D PO) Take 1 tablet by mouth daily.     . insulin NPH (HUMULIN,NOVOLIN) 100 UNIT/ML injection Administer 50 units subcutaneously with breakfast and 24 units with dinner 80 mL 0   . latanoprost (XALATAN) 0.005 % ophthalmic solution Place 1  drop into both eyes nightly.        Marland Kitchen lisinopril (PRINIVIL,ZESTRIL) 40 MG tablet Take 1 tablet (40 mg total) by mouth daily. 90 tablet 1   . metFORMIN (GLUCOPHAGE) 1000 MG tablet TAKE 1 TABLET (1,000 MG TOTAL) BY MOUTH 2 (TWO) TIMES DAILY WITH MEALS. 180 tablet 0     No current facility-administered medications for this visit.        Review of Systems   Constitutional: Positive for fatigue. Negative for chills and fever.   HENT: Positive for congestion (slight) and rhinorrhea. Negative for sinus pain, sinus pressure and sore throat.    Respiratory: Positive for cough. Negative for chest tightness, shortness of breath and wheezing.    Cardiovascular: Negative for chest pain and palpitations.   Gastrointestinal: Negative for diarrhea, nausea and vomiting.   Neurological: Positive for headaches (slight). Negative for dizziness and light-headedness.       Objective:     Physical Exam   Constitutional: She is oriented to person, place, and time. She appears well-developed.   Overweight   HENT:   Right Ear: No tenderness. Tympanic membrane is not erythematous and not bulging.   Left Ear: No tenderness. Tympanic membrane is not erythematous and not bulging.   Nose: Right sinus exhibits no maxillary sinus tenderness and no frontal sinus tenderness. Left sinus exhibits no maxillary sinus tenderness and no frontal sinus tenderness.   Mouth/Throat: No oropharyngeal exudate.   Neck: Neck supple.   Cardiovascular: Normal rate and regular rhythm.    No murmur heard.  Pulmonary/Chest: Effort normal. No respiratory distress. She has no wheezes. She has no rales.   Abdominal: Soft. There is no tenderness.   Musculoskeletal: She exhibits no edema.   Lymphadenopathy:     She has no cervical adenopathy.   Neurological: She is alert and oriented to person, place, and time.   Skin: She is not diaphoretic.   Vitals reviewed.     BP 134/82 Comment: right arm manual read  Pulse 99   Temp 97.3 F (36.3 C) (Oral)   Wt 83.5 kg (184 lb)    BMI 29.70 kg/m      Lab Results   Component Value Date    HGBA1C 8.4 (H) 09/03/2015     Lab Results   Component Value Date    WBC 7.94 09/04/2015    HGB 11.8 (  L) 09/04/2015    HCT 34.8 (L) 09/04/2015    MCV 96.9 09/04/2015    PLT 198 09/04/2015     '  Chemistry        Component Value Date/Time    NA 140 09/04/2015 0319    K 4.0 09/04/2015 0319    CL 103 09/04/2015 0319    CL 104 02/15/2014 0733    CO2 29 09/04/2015 0319    BUN 12.0 09/04/2015 0319    CREAT 0.8 09/04/2015 0319    CREAT 0.64 06/22/2015 0000    CREAT 0.78 02/15/2014 0733    GLU 173 (H) 09/04/2015 0319        Component Value Date/Time    CA 8.9 09/04/2015 0319    ALKPHOS 103 09/04/2015 0319    AST 36 (H) 09/04/2015 0319    ALT 57 (H) 09/04/2015 0319    BILITOTAL 0.4 09/04/2015 0319        Lab Results   Component Value Date    CHOL 119 09/03/2015    CHOL 144 12/18/2014    CHOL 119 05/16/2014     Lab Results   Component Value Date    HDL 43 09/03/2015    HDL 68 12/18/2014    HDL 66 05/16/2014     Lab Results   Component Value Date    LDL 48 09/03/2015    LDL 56 12/18/2014    LDL 33 05/16/2014     Lab Results   Component Value Date    TRIG 140 09/03/2015    TRIG 102 12/18/2014    TRIG 98 05/16/2014       Lab Results   Component Value Date    TSH 1.340 12/18/2014    TSH 1.17 06/28/2013       Assessment:     1. Upper respiratory tract infection, unspecified type  - Given patient has underlying diabetes and her symptoms have not been improving over the last 3 weeks, we will begin a course of Z-Pak.  - Current supportive care measures such as increased hydration.  - He may use Robitussin-AC, 5 mL every 8 hours as needed for cough suppression and is aware that this may cause drowsiness and she should not drive or operate heavy machinery while taking the medication.  - azithromycin (ZITHROMAX Z-PAK) 250 MG tablet; Take 2 tablets (500 mg) on  Day 1,  followed by 1 tablet (250 mg) once daily on Days 2 through 5.  Dispense: 6 tablet; Refill: 0    2. Dry cough  -  guaiFENesin-codeine (ROBITUSSIN AC) 100-10 MG/5ML syrup; Take 5 mLs by mouth 3 (three) times daily as needed for Cough.  Dispense: 118 mL; Refill: 0    3. Uncontrolled type 2 diabetes mellitus with complication, with long-term current use of insulin  - Diabetes has been uncontrolled and it is unclear as to whether she is compliant with insulin.  - We have strongly encouraged her to schedule the appointment with endocrinologist for which she has been given a referral in the past.  - We will repeat a hemoglobin A1c and chemistries to reevaluate.  - Encourage efforts to lose weight, monitor carbohydrate intake and also exercise regularly as tolerated.  - For now, she will continue current metformin and also current NPH insulin 50 units with breakfast and 25 units with dinner.  - Comprehensive metabolic panel  - Hemoglobin A1C  - metFORMIN (GLUCOPHAGE) 1000 MG tablet; Take 1 tablet (1,000 mg total) by mouth 2 (two) times daily  with meals.  Dispense: 180 tablet; Refill: 0  - insulin NPH (HUMULIN,NOVOLIN) 100 UNIT/ML injection; Administer 50 units subcutaneously with breakfast and 24 units with dinner  Dispense: 80 mL; Refill: 0    4. Essential hypertension  - Hypertension is fairly well-controlled blood pressure close to goal of under 130/80 mmHg   - Patient will continue current lisinopril 40 mg daily.  - Comprehensive metabolic panel  - lisinopril (PRINIVIL,ZESTRIL) 40 MG tablet; Take 1 tablet (40 mg total) by mouth daily.  Dispense: 90 tablet; Refill: 1    5. Mixed hyperlipidemia  - Well-controlled on last lipid panel done in July.  - We will repeat a fasting lipid panel to reevaluate.  - Encourage heart healthy diet such as a Mediterranean diet together with aerobic exercise for 30-45 minutes 4-5 days a week as tolerated.  - We will continue current Lipitor 20 mg daily and adjust as needed.  - Comprehensive metabolic panel  - Lipid panel  - atorvastatin (LIPITOR) 20 MG tablet; Take 1 tablet (20 mg total) by mouth  daily.  Dispense: 90 tablet; Refill: 1        Plan:     Plan is as above.     Risk & Benefits of the new medication(s) were explained to the patient (and family) who verbalized understanding & agreed to the treatment plan. Patient (family) encouraged to contact me/clinical staff with any questions/concerns    RTC in 3 months for f/u hypertension, sooner as needed    Standley Dakins, MD

## 2016-02-23 LAB — COMPREHENSIVE METABOLIC PANEL
ALT: 29 IU/L (ref 0–32)
AST (SGOT): 27 IU/L (ref 0–40)
Albumin/Globulin Ratio: 1.2 (ref 1.2–2.2)
Albumin: 4.2 g/dL (ref 3.6–4.8)
Alkaline Phosphatase: 94 IU/L (ref 39–117)
BUN / Creatinine Ratio: 9 — ABNORMAL LOW (ref 12–28)
BUN: 6 mg/dL — ABNORMAL LOW (ref 8–27)
Bilirubin, Total: 0.6 mg/dL (ref 0.0–1.2)
CO2: 26 mmol/L (ref 18–29)
Calcium: 8.9 mg/dL (ref 8.7–10.3)
Chloride: 102 mmol/L (ref 96–106)
Creatinine: 0.7 mg/dL (ref 0.57–1.00)
EGFR: 109 mL/min/{1.73_m2} (ref >59)
EGFR: 94 mL/min/{1.73_m2} (ref >59)
Globulin, Total: 3.4 g/dL (ref 1.5–4.5)
Glucose: 106 mg/dL — ABNORMAL HIGH (ref 65–99)
Potassium: 4.7 mmol/L (ref 3.5–5.2)
Protein, Total: 7.6 g/dL (ref 6.0–8.5)
Sodium: 145 mmol/L — ABNORMAL HIGH (ref 134–144)

## 2016-02-23 LAB — LIPID PANEL
Cholesterol / HDL Ratio: 2.6 ratio units (ref 0.0–4.4)
Cholesterol: 136 mg/dL (ref 100–199)
HDL: 52 mg/dL (ref >39)
LDL Calculated: 65 mg/dL (ref 0–99)
Triglycerides: 95 mg/dL (ref 0–149)
VLDL Calculated: 19 mg/dL (ref 5–40)

## 2016-02-23 LAB — HEMOGLOBIN A1C: Hemoglobin A1C: 9.4 % — ABNORMAL HIGH (ref 4.8–5.6)

## 2016-04-01 ENCOUNTER — Other Ambulatory Visit (INDEPENDENT_AMBULATORY_CARE_PROVIDER_SITE_OTHER): Payer: Self-pay | Admitting: Internal Medicine

## 2016-04-01 DIAGNOSIS — IMO0002 Reserved for concepts with insufficient information to code with codable children: Secondary | ICD-10-CM

## 2016-04-12 ENCOUNTER — Encounter (INDEPENDENT_AMBULATORY_CARE_PROVIDER_SITE_OTHER): Payer: Self-pay | Admitting: Internal Medicine

## 2016-05-11 ENCOUNTER — Telehealth (INDEPENDENT_AMBULATORY_CARE_PROVIDER_SITE_OTHER): Payer: Self-pay

## 2016-05-11 NOTE — Telephone Encounter (Signed)
Call to patient to schedule f/u appointment, contact information left with daughter.

## 2016-05-18 NOTE — Telephone Encounter (Signed)
Pt came in person and was scheduled for 05/23/2016.  Thank you

## 2016-05-23 ENCOUNTER — Encounter (INDEPENDENT_AMBULATORY_CARE_PROVIDER_SITE_OTHER): Payer: Self-pay | Admitting: Internal Medicine

## 2016-05-23 ENCOUNTER — Ambulatory Visit (INDEPENDENT_AMBULATORY_CARE_PROVIDER_SITE_OTHER): Payer: HMO | Admitting: Internal Medicine

## 2016-05-23 VITALS — BP 135/77 | HR 84 | Temp 97.9°F | Wt 191.0 lb

## 2016-05-23 DIAGNOSIS — E1165 Type 2 diabetes mellitus with hyperglycemia: Secondary | ICD-10-CM

## 2016-05-23 DIAGNOSIS — IMO0002 Reserved for concepts with insufficient information to code with codable children: Secondary | ICD-10-CM

## 2016-05-23 DIAGNOSIS — M79672 Pain in left foot: Secondary | ICD-10-CM

## 2016-05-23 DIAGNOSIS — E118 Type 2 diabetes mellitus with unspecified complications: Secondary | ICD-10-CM

## 2016-05-23 DIAGNOSIS — M67449 Ganglion, unspecified hand: Secondary | ICD-10-CM

## 2016-05-23 DIAGNOSIS — Z794 Long term (current) use of insulin: Secondary | ICD-10-CM

## 2016-05-23 DIAGNOSIS — M79671 Pain in right foot: Secondary | ICD-10-CM

## 2016-05-23 DIAGNOSIS — E782 Mixed hyperlipidemia: Secondary | ICD-10-CM

## 2016-05-23 MED ORDER — METFORMIN HCL 1000 MG PO TABS
1000.0000 mg | ORAL_TABLET | Freq: Two times a day (BID) | ORAL | 0 refills | Status: DC
Start: 2016-05-23 — End: 2016-08-19

## 2016-05-23 MED ORDER — ATORVASTATIN CALCIUM 20 MG PO TABS
20.0000 mg | ORAL_TABLET | Freq: Every day | ORAL | 1 refills | Status: DC
Start: 2016-05-23 — End: 2016-08-23

## 2016-05-23 MED ORDER — INSULIN NPH (HUMAN) (ISOPHANE) 100 UNIT/ML SC SUSP
SUBCUTANEOUS | 0 refills | Status: DC
Start: 2016-05-23 — End: 2016-08-23

## 2016-05-23 MED ORDER — INSULIN SYRINGE-NEEDLE U-100 31G X 5/16" 1 ML MISC
0 refills | Status: DC
Start: 2016-05-23 — End: 2016-08-23

## 2016-05-23 NOTE — Progress Notes (Signed)
Nursing Documentation:  Limb alert status: Patient asked and denied any limb restrictions for blood pressure/blood draws.  Has the patient seen any other providers since their last visit: no  The patient is due for shingles vaccine, DM Opth, foot exam and [facility]

## 2016-05-23 NOTE — Progress Notes (Signed)
Subjective:      Patient ID: [name] is a 61 y.o. female     Chief Complaint   Patient presents with   . Hypertension   . Diabetes   . Hyperlipidemia        HPI   61 year old Hispanic female with history of uncontrolled diabetes mellitus 2, hypertension, hyperlipidemia who is also followed by cardiologist Dr. Tylene Fantasia due to a history of mitral regurgitation who presents today for follow-up of the above.    She has gained 7 pounds since her last visit in January.  She reports walking regularly.    She has not made the appointment with an endocrinologist that she was advised during her last visit.  She reports compliance with all medications for diabetes.  She does not bring in her glucometer but reports that her morning glucose readings have been in the 90s and states evening glucose readings have been around the 190s.  Denies any polydipsia, polyuria, or paresthesias.    He ports compliance with Lipitor.  No myalgias or abdominal pain.  Last lipid panel done in January showed good control.    Reports she has noticed a cyst on her right 3rd finger which has been removed by dermatologist in the past.  She reports she was advised to return if the area came back.  He would like a referral to dermatology.    Also reports that she has been having bilateral pain in the feet for months which has been worsening.  She describes the pain is achy in nature.  Denies any burning or tingling sensation.  Denies any falls or injuries.    The following sections were reviewed this encounter by the provider: Meds    Problems        Past Medical History:   Diagnosis Date   . Diabetes mellitus type 2 in obese    . Diabetic retinopathy 06/28/2013    Of the right eye, per patient   . Glaucoma 06/01/2012   . Hyperlipidemia    . Hypertension    . Obesity    . Venous insufficiency 09/17/2014    Followed at [facility].  Wears compression stockings.        Social History     Social History   . Marital status: Single      Spouse name: N/A   . Number of children: 3   . 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 Smoking status: Never Smoker   . Smokeless tobacco: Never Used   . Alcohol use No   . Drug use: No   . Sexual activity: Not Currently     Other Topics Concern   . Not on file     Social History Narrative    Originally from [place].  Lives with her daughter.    Exercise: walks frequently       Allergies   Allergen Reactions   . Shellfish-Derived Products        Current Outpatient Prescriptions   Medication Sig Dispense Refill   . amLODIPine (NORVASC) 5 MG tablet Take 5 mg by mouth daily.     Marland Kitchen aspirin 81 MG tablet Take 81 mg by mouth daily.       Marland Kitchen atorvastatin (LIPITOR) 20 MG tablet Take 1 tablet (20 mg total) by mouth daily. 90 tablet 1   . B-D INS SYR ULTRAFINE  1CC/31G 31G X 5/16" 1 ML Misc USE SYRINGE FOR INSULIN INJECTION 2 TIMES A DAY 100 each 1   . Calcium Carbonate-Vitamin D (CALCIUM-D PO) Take 1 tablet by mouth daily.     . insulin NPH (HUMULIN,NOVOLIN) 100 UNIT/ML injection Administer 50 units subcutaneously with breakfast and 24 units with dinner 80 mL 0   . latanoprost (XALATAN) 0.005 % ophthalmic solution Place 1 drop into both eyes nightly.        Marland Kitchen lisinopril (PRINIVIL,ZESTRIL) 40 MG tablet Take 1 tablet (40 mg total) by mouth daily. 90 tablet 1   . metFORMIN (GLUCOPHAGE) 1000 MG tablet Take 1 tablet (1,000 mg total) by mouth 2 (two) times daily with meals. 180 tablet 0     No current facility-administered medications for this visit.        Review of Systems   Constitutional: Negative for fatigue.   Respiratory: Negative for chest tightness and shortness of breath.    Cardiovascular: Negative for chest pain and palpitations.   Gastrointestinal: Negative for abdominal pain, constipation, diarrhea, nausea and vomiting.   Endocrine: Negative for polydipsia and polyuria.   Musculoskeletal: Negative for myalgias.   Neurological: Negative for dizziness,  light-headedness and headaches.        Objective:     Physical Exam   Constitutional: She is oriented to person, place, and time. She appears well-developed. No distress.   Obese by BMI criteria   HENT:   Moist mucous membranes   Eyes: Conjunctivae are normal. No scleral icterus.   Cardiovascular: Normal rate and regular rhythm.  Exam reveals no gallop and no friction rub.    No murmur heard.  Pulmonary/Chest: Effort normal. No respiratory distress. She has no wheezes. She has no rales.   Abdominal: Soft. She exhibits no mass. There is no tenderness.   Musculoskeletal: She exhibits no edema.   No reproducible tenderness on palpation of bilateral feet throughout   Neurological: She is alert and oriented to person, place, and time.   Skin: Skin is warm and dry. She is not diaphoretic.   There is some dryness and scaling of bilateral soles of feet that any other skin breakdown.   Psychiatric: Her behavior is normal.   Vitals reviewed.    BP 135/77 (BP Site: Right arm, Patient Position: Sitting, Cuff Size: Medium)   Pulse 84   Temp 97.9 F (36.6 C) (Oral)   Wt 86.6 kg (191 lb)   BMI 30.83 kg/m     Lab Results   Component Value Date    HGBA1C 9.4 (H) 02/22/2016     Lab Results   Component Value Date    WBC 7.94 09/04/2015    HGB 11.8 (L) 09/04/2015    HCT 34.8 (L) 09/04/2015    MCV 96.9 09/04/2015    PLT 198 09/04/2015     '  Chemistry        Component Value Date/Time    NA 145 (H) 02/22/2016 0000    K 4.7 02/22/2016 0000    CL 102 02/22/2016 0000    CL 104 02/15/2014 0733    CO2 26 02/22/2016 0000    BUN 6 (L) 02/22/2016 0000    CREAT 0.70 02/22/2016 0000    CREAT 0.78 02/15/2014 0733    GLU 106 (H) 02/22/2016 0000    GLU 173 (H) 09/04/2015 0319        Component Value Date/Time    CA 8.9 02/22/2016 0000    ALKPHOS 94 02/22/2016 0000  AST 27 02/22/2016 0000    ALT 29 02/22/2016 0000    BILITOTAL 0.6 02/22/2016 0000        Lab Results   Component Value Date    CHOL 136 02/22/2016    CHOL 119 09/03/2015    CHOL 144  12/18/2014     Lab Results   Component Value Date    HDL 52 02/22/2016    HDL 43 09/03/2015    HDL 68 12/18/2014     Lab Results   Component Value Date    LDL 65 02/22/2016    LDL 48 09/03/2015    LDL 56 12/18/2014     Lab Results   Component Value Date    TRIG 95 02/22/2016    TRIG 140 09/03/2015    TRIG 102 12/18/2014     Lab Results   Component Value Date    TSH 1.340 12/18/2014    TSH 1.17 06/28/2013          Assessment:     1. Uncontrolled type 2 diabetes mellitus with complication, with long-term current use of insulin  - Uncontrolled on last A1c.  - Repeat A1c to reevaluate.  - Strongly encourage patient to be seen by endocrinology and she reports she will make an appointment with Dr. Marcellina Millin who she has seen previously.  - Encourage monitoring blood sugars regularly and taking her glucometer to her next appointment with the endocrinologist.  - Now, she will continue her current medications for diabetes.  - Comprehensive metabolic panel  - Hemoglobin A1C  - CBC and differential  - metFORMIN (GLUCOPHAGE) 1000 MG tablet; Take 1 tablet (1,000 mg total) by mouth 2 (two) times daily with meals.  Dispense: 180 tablet; Refill: 0  - insulin NPH (HUMULIN,NOVOLIN) 100 UNIT/ML injection; Administer 50 units subcutaneously with breakfast and 24 units with dinner  Dispense: 80 mL; Refill: 0  - Endocrinology Referral: Marcellina Millin, MD Mackie Pai)    2. Mixed hyperlipidemia  - Controlled on last lipid panel done in January.  Continue current Lipitor.  - Comprehensive metabolic panel  - CBC and differential  - atorvastatin (LIPITOR) 20 MG tablet; Take 1 tablet (20 mg total) by mouth daily.  Dispense: 90 tablet; Refill: 1    3. Mucous cyst of finger  - Referral to be seen by orthopedist was given.  - Ambulatory referral to Orthopedic Surgery    4. Bilateral foot pain  - unclear of cause, referral to be seen by podiatry was given.  - Ambulatory referral to Podiatry        Plan:   Plan is as above.     Hypertension managed by  cardiologist Dr. Fransisco Beau.     RTC in 3 months sooner prn.     Standley Dakins, MD

## 2016-05-23 NOTE — Patient Instructions (Signed)
Pregunte en su farmacia si le sale mas barato usar una de las siguientes insulinas:    Hospital doctor    Otra NPH

## 2016-05-24 ENCOUNTER — Other Ambulatory Visit (INDEPENDENT_AMBULATORY_CARE_PROVIDER_SITE_OTHER): Payer: Self-pay | Admitting: Internal Medicine

## 2016-05-24 ENCOUNTER — Encounter (INDEPENDENT_AMBULATORY_CARE_PROVIDER_SITE_OTHER): Payer: Self-pay | Admitting: Internal Medicine

## 2016-05-24 DIAGNOSIS — R748 Abnormal levels of other serum enzymes: Secondary | ICD-10-CM

## 2016-05-24 DIAGNOSIS — E669 Obesity, unspecified: Secondary | ICD-10-CM

## 2016-05-24 DIAGNOSIS — K760 Fatty (change of) liver, not elsewhere classified: Secondary | ICD-10-CM

## 2016-05-24 LAB — CBC AND DIFFERENTIAL
Baso(Absolute): 0 10*3/uL (ref 0.0–0.2)
Basos: 0 %
Eos: 2 %
Eosinophils Absolute: 0.2 10*3/uL (ref 0.0–0.4)
Hematocrit: 43.2 % (ref 34.0–46.6)
Hemoglobin: 14.5 g/dL (ref 11.1–15.9)
Immature Granulocytes Absolute: 0 10*3/uL (ref 0.0–0.1)
Immature Granulocytes: 0 %
Lymphocytes Absolute: 2.6 10*3/uL (ref 0.7–3.1)
Lymphocytes: 31 %
MCH: 29.7 pg (ref 26.6–33.0)
MCHC: 33.6 g/dL (ref 31.5–35.7)
MCV: 89 fL (ref 79–97)
Monocytes Absolute: 0.7 10*3/uL (ref 0.1–0.9)
Monocytes: 8 %
Neutrophils Absolute: 4.8 10*3/uL (ref 1.4–7.0)
Neutrophils: 59 %
Platelets: 246 10*3/uL (ref 150–379)
RBC: 4.88 x10E6/uL (ref 3.77–5.28)
RDW: 14.3 % (ref 12.3–15.4)
WBC: 8.3 10*3/uL (ref 3.4–10.8)

## 2016-05-24 LAB — COMPREHENSIVE METABOLIC PANEL
ALT: 34 IU/L — ABNORMAL HIGH (ref 0–32)
AST (SGOT): 30 IU/L (ref 0–40)
Albumin/Globulin Ratio: 1.3 (ref 1.2–2.2)
Albumin: 4.3 g/dL (ref 3.6–4.8)
Alkaline Phosphatase: 129 IU/L — ABNORMAL HIGH (ref 39–117)
BUN / Creatinine Ratio: 20 (ref 12–28)
BUN: 15 mg/dL (ref 8–27)
Bilirubin, Total: 0.5 mg/dL (ref 0.0–1.2)
CO2: 24 mmol/L (ref 18–29)
Calcium: 9.7 mg/dL (ref 8.7–10.3)
Chloride: 101 mmol/L (ref 96–106)
Creatinine: 0.76 mg/dL (ref 0.57–1.00)
EGFR: 86 mL/min/{1.73_m2} (ref >59)
EGFR: 99 mL/min/{1.73_m2} (ref >59)
Globulin, Total: 3.3 g/dL (ref 1.5–4.5)
Glucose: 102 mg/dL — ABNORMAL HIGH (ref 65–99)
Potassium: 4.8 mmol/L (ref 3.5–5.2)
Protein, Total: 7.6 g/dL (ref 6.0–8.5)
Sodium: 144 mmol/L (ref 134–144)

## 2016-05-24 LAB — PLEASE NOTE

## 2016-05-24 LAB — HEMOGLOBIN A1C: Hemoglobin A1C: 9.5 % — ABNORMAL HIGH (ref 4.8–5.6)

## 2016-05-24 MED ORDER — ONETOUCH ULTRA BLUE VI STRP
ORAL_STRIP | 0 refills | Status: DC
Start: 2016-05-24 — End: 2016-08-23

## 2016-06-06 ENCOUNTER — Other Ambulatory Visit (INDEPENDENT_AMBULATORY_CARE_PROVIDER_SITE_OTHER): Payer: Self-pay | Admitting: Internal Medicine

## 2016-06-06 DIAGNOSIS — IMO0002 Reserved for concepts with insufficient information to code with codable children: Secondary | ICD-10-CM

## 2016-07-18 ENCOUNTER — Encounter (INDEPENDENT_AMBULATORY_CARE_PROVIDER_SITE_OTHER): Payer: Self-pay

## 2016-07-18 NOTE — Progress Notes (Signed)
A1C Report.    Last A1C was on 05/23/2016. Follow up appointment for Diabetes scheduled for 08/22/2016.

## 2016-08-19 ENCOUNTER — Other Ambulatory Visit (INDEPENDENT_AMBULATORY_CARE_PROVIDER_SITE_OTHER): Payer: Self-pay | Admitting: Internal Medicine

## 2016-08-19 DIAGNOSIS — IMO0002 Reserved for concepts with insufficient information to code with codable children: Secondary | ICD-10-CM

## 2016-08-22 ENCOUNTER — Ambulatory Visit (INDEPENDENT_AMBULATORY_CARE_PROVIDER_SITE_OTHER): Payer: HMO | Admitting: Internal Medicine

## 2016-08-23 ENCOUNTER — Encounter (INDEPENDENT_AMBULATORY_CARE_PROVIDER_SITE_OTHER): Payer: Self-pay | Admitting: Internal Medicine

## 2016-08-23 ENCOUNTER — Other Ambulatory Visit: Payer: HMO

## 2016-08-23 ENCOUNTER — Ambulatory Visit (INDEPENDENT_AMBULATORY_CARE_PROVIDER_SITE_OTHER): Payer: HMO | Admitting: Internal Medicine

## 2016-08-23 VITALS — BP 127/74 | HR 79 | Temp 98.0°F | Wt 190.0 lb

## 2016-08-23 DIAGNOSIS — E782 Mixed hyperlipidemia: Secondary | ICD-10-CM

## 2016-08-23 DIAGNOSIS — IMO0002 Reserved for concepts with insufficient information to code with codable children: Secondary | ICD-10-CM

## 2016-08-23 DIAGNOSIS — Z794 Long term (current) use of insulin: Secondary | ICD-10-CM

## 2016-08-23 DIAGNOSIS — E118 Type 2 diabetes mellitus with unspecified complications: Secondary | ICD-10-CM

## 2016-08-23 DIAGNOSIS — K760 Fatty (change of) liver, not elsewhere classified: Secondary | ICD-10-CM

## 2016-08-23 DIAGNOSIS — E1165 Type 2 diabetes mellitus with hyperglycemia: Secondary | ICD-10-CM

## 2016-08-23 DIAGNOSIS — I10 Essential (primary) hypertension: Secondary | ICD-10-CM

## 2016-08-23 DIAGNOSIS — R748 Abnormal levels of other serum enzymes: Secondary | ICD-10-CM

## 2016-08-23 MED ORDER — ATORVASTATIN CALCIUM 20 MG PO TABS
20.0000 mg | ORAL_TABLET | Freq: Every day | ORAL | 1 refills | Status: DC
Start: 2016-08-23 — End: 2017-01-20

## 2016-08-23 MED ORDER — LISINOPRIL 40 MG PO TABS
40.0000 mg | ORAL_TABLET | Freq: Every day | ORAL | 1 refills | Status: DC
Start: 2016-08-23 — End: 2017-01-20

## 2016-08-23 MED ORDER — INSULIN NPH (HUMAN) (ISOPHANE) 100 UNIT/ML SC SUSP
SUBCUTANEOUS | 1 refills | Status: DC
Start: 2016-08-23 — End: 2017-01-20

## 2016-08-23 MED ORDER — METFORMIN HCL 1000 MG PO TABS
1000.0000 mg | ORAL_TABLET | Freq: Two times a day (BID) | ORAL | 0 refills | Status: DC
Start: 2016-08-23 — End: 2017-01-20

## 2016-08-23 MED ORDER — ONETOUCH ULTRA BLUE VI STRP
ORAL_STRIP | 1 refills | Status: DC
Start: 2016-08-23 — End: 2017-01-20

## 2016-08-23 MED ORDER — INSULIN SYRINGE-NEEDLE U-100 31G X 5/16" 1 ML MISC
1 refills | Status: DC
Start: 2016-08-23 — End: 2017-01-20

## 2016-08-23 NOTE — Progress Notes (Signed)
Have you seen any specialists/other providers since your last visit with us?      No      Arm preference verified?     Yes    The patient is due for shingles vaccine and [facility]

## 2016-08-23 NOTE — Progress Notes (Signed)
Subjective:      Patient ID: [name] is a 61 y.o. female     Chief Complaint   Patient presents with   . Diabetes   . Hypertension   . Hyperlipidemia        HPI   61 year old Hispanic female with history of uncontrolled diabetes mellitus 2, hypertension, hyperlipidemia who is also followed by cardiologist Dr. Fransisco Beau due to a history of mitral regurgitation who presents today for follow-up of the above.    Since her last visit in April her weight has remained fairly stable.  She reports she continues to walk regularly and states she has been monitoring her diet.    She has again not been seen by her endocrinologist, which she was advised to do once again during her last visit in April.  , he reports checking her blood glucose 2 times a day, but again does not bring in her glucometer.  She states her morning fasting glucose readings are around 100s and the evening glucose readings are in the 190s.  She reports compliance with metformin 1000 mg 2 times a day and also NPH insulin 50 units with breakfast and 24 units with dinner. She denies any polydipsia, polyuria, or paresthesias.    Regarding hypertension, her blood pressure is well-controlled today.  She is on therapy with amlodipine 5 mg daily (prescribed by her cardiologist) and also lisinopril 40 mg daily.Patient denies any frequent headaches, chest pain, palpitations, shortness of breath, orthopnea or PND.      She reports compliance with Lipitor.  No myalgias or abdominal pain.  Last lipid panel done in January showed good control.    The following sections were reviewed this encounter by the provider: Meds    Problems        Past Medical History:   Diagnosis Date   . Diabetes mellitus type 2 in obese    . Diabetic retinopathy 06/28/2013    Of the right eye, per patient   . Glaucoma 06/01/2012   . Hyperlipidemia    . Hypertension    . Obesity    . Venous insufficiency 09/17/2014    Followed at [facility].  Wears compression stockings.         Social History     Social History   . Marital status: Single     Spouse name: N/A   . Number of children: 3   . 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 Smoking status: Never Smoker   . Smokeless tobacco: Never Used   . Alcohol use No   . Drug use: No   . Sexual activity: Not Currently     Other Topics Concern   . Not on file     Social History Narrative    Originally from [place].  Lives with her daughter.    Exercise: walks frequently       Allergies   Allergen Reactions   . Shellfish-Derived Products        Current Outpatient Prescriptions   Medication Sig Dispense Refill   . amLODIPine (NORVASC) 5 MG tablet Take 5 mg by mouth daily.     Marland Kitchen aspirin 81 MG tablet Take 81 mg by mouth daily.       Marland Kitchen atorvastatin (LIPITOR) 20 MG tablet Take 1 tablet (20 mg total) by mouth daily. 90 tablet 1   . Calcium  Carbonate-Vitamin D (CALCIUM-D PO) Take 1 tablet by mouth daily.     . insulin NPH (HUMULIN,NOVOLIN) 100 UNIT/ML injection Administer 50 units subcutaneously with breakfast and 24 units with dinner 80 mL 1   . Insulin Syringe-Needle U-100 (B-D INS SYR ULTRAFINE 1CC/31G) 31G X 5/16" 1 ML Misc Administer insulin subcutaneously 2 times a day 100 each 1   . latanoprost (XALATAN) 0.005 % ophthalmic solution Place 1 drop into both eyes nightly.        Marland Kitchen lisinopril (PRINIVIL,ZESTRIL) 40 MG tablet Take 1 tablet (40 mg total) by mouth daily. 90 tablet 1   . metFORMIN (GLUCOPHAGE) 1000 MG tablet Take 1 tablet (1,000 mg total) by mouth 2 (two) times daily with meals. 180 tablet 0   . ONE TOUCH ULTRA TEST test strip Check glucose once a day.  ICD-10: E11.9 200 each 1     No current facility-administered medications for this visit.        Review of Systems   Constitutional: Negative for fatigue.   Respiratory: Negative for chest tightness and shortness of breath.    Cardiovascular: Negative for chest pain and palpitations.   Gastrointestinal: Negative for  abdominal pain, constipation, diarrhea, nausea and vomiting.   Endocrine: Negative for polydipsia and polyuria.   Musculoskeletal: Negative for myalgias.   Neurological: Positive for headaches (occasional). Negative for dizziness and light-headedness.        Objective:     Physical Exam   Constitutional: She is oriented to person, place, and time. She appears well-developed. No distress.   Obese by BMI criteria   HENT:   Moist mucous membranes   Eyes: Conjunctivae are normal. No scleral icterus.   Cardiovascular: Normal rate and regular rhythm.  Exam reveals no gallop and no friction rub.    No murmur heard.  Pulmonary/Chest: Effort normal. No respiratory distress. She has no wheezes. She has no rales.   Abdominal: Soft. She exhibits no mass. There is no tenderness.   Musculoskeletal: She exhibits no edema.   Neurological: She is alert and oriented to person, place, and time.   Skin: Skin is warm and dry. She is not diaphoretic.   Psychiatric: Her behavior is normal.   Vitals reviewed.     BP 127/74 (BP Site: Right arm, Patient Position: Sitting, Cuff Size: Medium)   Pulse 79   Temp 98 F (36.7 C) (Oral)   Wt 86.2 kg (190 lb)   BMI 30.67 kg/m     Lab Results   Component Value Date    HGBA1C 9.5 (H) 05/23/2016     Lab Results   Component Value Date    WBC 8.3 05/23/2016    HGB 14.5 05/23/2016    HCT 43.2 05/23/2016    MCV 89 05/23/2016    PLT 246 05/23/2016       '  Chemistry        Component Value Date/Time    NA 144 05/23/2016 0000    K 4.8 05/23/2016 0000    CL 101 05/23/2016 0000    CL 104 02/15/2014 0733    CO2 24 05/23/2016 0000    BUN 15 05/23/2016 0000    CREAT 0.76 05/23/2016 0000    CREAT 0.78 02/15/2014 0733    GLU 102 (H) 05/23/2016 0000    GLU 173 (H) 09/04/2015 0319        Component Value Date/Time    CA 9.7 05/23/2016 0000    ALKPHOS 129 (H) 05/23/2016 0000    AST  30 05/23/2016 0000    ALT 34 (H) 05/23/2016 0000    BILITOTAL 0.5 05/23/2016 0000          Lab Results   Component Value Date    CHOL  136 02/22/2016    CHOL 119 09/03/2015    CHOL 144 12/18/2014     Lab Results   Component Value Date    HDL 52 02/22/2016    HDL 43 09/03/2015    HDL 68 12/18/2014     Lab Results   Component Value Date    LDL 65 02/22/2016    LDL 48 09/03/2015    LDL 56 12/18/2014     Lab Results   Component Value Date    TRIG 95 02/22/2016    TRIG 140 09/03/2015    TRIG 102 12/18/2014     Lab Results   Component Value Date    TSH 1.340 12/18/2014    TSH 1.17 06/28/2013          Assessment:     1. Uncontrolled type 2 diabetes mellitus with complication, with long-term current use of insulin  - Uncontrolled on last hemoglobin A1c, which was significantly elevated above the goal of under 7 percent.  - Strongly encourage patient to make appointment with endocrinology who she has seen in the past and patient agrees to make appointment over the next few weeks.  - We will recheck a hemoglobin A1c to reevaluate.  - Encourage efforts to lose weight, monitor carbohydrate intake and exercise regularly as tolerated.  - Now continue current metformin 1000 mg 2 times a day and current NPH insulin.  I have advised patient to consider trial of an alternate insulin such as Basaglar but patient declines due to concerns about cost.  - Comprehensive metabolic panel  - Hemoglobin A1C  - metFORMIN (GLUCOPHAGE) 1000 MG tablet; Take 1 tablet (1,000 mg total) by mouth 2 (two) times daily with meals.  Dispense: 180 tablet; Refill: 0  - insulin NPH (HUMULIN,NOVOLIN) 100 UNIT/ML injection; Administer 50 units subcutaneously with breakfast and 24 units with dinner  Dispense: 80 mL; Refill: 1  - ONE TOUCH ULTRA TEST test strip; Check glucose once a day.  ICD-10: E11.9  Dispense: 200 each; Refill: 1    2. Essential hypertension  - Well-controlled with blood pressure at goal of under 130/77mmHg  - Continue current medications  - Encourage low sodium diet and routine exercise as tolerated  - Comprehensive metabolic panel  - lisinopril (PRINIVIL,ZESTRIL) 40 MG  tablet; Take 1 tablet (40 mg total) by mouth daily.  Dispense: 90 tablet; Refill: 1    3. Mixed hyperlipidemia  - Well-controlled on last lipid panel that was last done  - Continue current statin medication  - Encourage heart healthy diet such as the mediterranean diet together with aerobic exercise for 30-45 minutes 4-5 days a week as tolerated  - Advised patient to fast for her last appointment so that we can recheck a lipid panel  - Comprehensive metabolic panel  - atorvastatin (LIPITOR) 20 MG tablet; Take 1 tablet (20 mg total) by mouth daily.  Dispense: 90 tablet; Refill: 1    4. Elevated liver enzymes  - Recheck liver enzymes to reevaluate.  - Suspect this is secondary to fatty liver, which was seen on abdominal ultrasound done in July 2017.  He was given an order for a follow-up ultrasound during her last visit in April but has not had done.  Encourage patient to have study done as the  last one showed borderline hepatomegaly.  - Comprehensive metabolic panel        Plan:     Plan is as above.    Patient will return to clinic in 3 months for follow-up of diabetes, hypertension, hyperlipidemia or sooner as needed.    Standley Dakins, MD

## 2016-08-24 LAB — COMPREHENSIVE METABOLIC PANEL
ALT: 21 IU/L (ref 0–32)
AST (SGOT): 20 IU/L (ref 0–40)
Albumin/Globulin Ratio: 1.4 (ref 1.2–2.2)
Albumin: 4.3 g/dL (ref 3.6–4.8)
Alkaline Phosphatase: 114 IU/L (ref 39–117)
BUN / Creatinine Ratio: 27 (ref 12–28)
BUN: 21 mg/dL (ref 8–27)
Bilirubin, Total: 0.4 mg/dL (ref 0.0–1.2)
CO2: 24 mmol/L (ref 20–29)
Calcium: 9.7 mg/dL (ref 8.7–10.3)
Chloride: 100 mmol/L (ref 96–106)
Creatinine: 0.78 mg/dL (ref 0.57–1.00)
EGFR: 83 mL/min/{1.73_m2} (ref >59)
EGFR: 96 mL/min/{1.73_m2} (ref >59)
Globulin, Total: 3.1 g/dL (ref 1.5–4.5)
Glucose: 175 mg/dL — ABNORMAL HIGH (ref 65–99)
Potassium: 5 mmol/L (ref 3.5–5.2)
Protein, Total: 7.4 g/dL (ref 6.0–8.5)
Sodium: 140 mmol/L (ref 134–144)

## 2016-08-24 LAB — HEMOGLOBIN A1C: Hemoglobin A1C: 8.2 % — ABNORMAL HIGH (ref 4.8–5.6)

## 2016-10-17 ENCOUNTER — Encounter (INDEPENDENT_AMBULATORY_CARE_PROVIDER_SITE_OTHER): Payer: Self-pay | Admitting: Internal Medicine

## 2016-11-03 ENCOUNTER — Ambulatory Visit (INDEPENDENT_AMBULATORY_CARE_PROVIDER_SITE_OTHER): Payer: HMO | Admitting: Internal Medicine

## 2016-11-04 ENCOUNTER — Encounter (INDEPENDENT_AMBULATORY_CARE_PROVIDER_SITE_OTHER): Payer: Self-pay | Admitting: Internal Medicine

## 2016-11-23 ENCOUNTER — Encounter (INDEPENDENT_AMBULATORY_CARE_PROVIDER_SITE_OTHER): Payer: Self-pay | Admitting: Internal Medicine

## 2016-11-23 ENCOUNTER — Ambulatory Visit (INDEPENDENT_AMBULATORY_CARE_PROVIDER_SITE_OTHER): Payer: HMO | Admitting: Internal Medicine

## 2016-11-23 VITALS — BP 153/74 | HR 85 | Temp 98.6°F | Wt 192.0 lb

## 2016-11-23 DIAGNOSIS — E118 Type 2 diabetes mellitus with unspecified complications: Secondary | ICD-10-CM

## 2016-11-23 DIAGNOSIS — E1165 Type 2 diabetes mellitus with hyperglycemia: Secondary | ICD-10-CM

## 2016-11-23 DIAGNOSIS — K760 Fatty (change of) liver, not elsewhere classified: Secondary | ICD-10-CM

## 2016-11-23 DIAGNOSIS — Z1211 Encounter for screening for malignant neoplasm of colon: Secondary | ICD-10-CM

## 2016-11-23 DIAGNOSIS — M25552 Pain in left hip: Secondary | ICD-10-CM

## 2016-11-23 DIAGNOSIS — E782 Mixed hyperlipidemia: Secondary | ICD-10-CM

## 2016-11-23 DIAGNOSIS — IMO0002 Reserved for concepts with insufficient information to code with codable children: Secondary | ICD-10-CM

## 2016-11-23 DIAGNOSIS — Z1239 Encounter for other screening for malignant neoplasm of breast: Secondary | ICD-10-CM

## 2016-11-23 DIAGNOSIS — I10 Essential (primary) hypertension: Secondary | ICD-10-CM

## 2016-11-23 DIAGNOSIS — Z794 Long term (current) use of insulin: Secondary | ICD-10-CM

## 2016-11-23 DIAGNOSIS — Z1231 Encounter for screening mammogram for malignant neoplasm of breast: Secondary | ICD-10-CM

## 2016-11-23 DIAGNOSIS — M25551 Pain in right hip: Secondary | ICD-10-CM

## 2016-11-23 DIAGNOSIS — Z124 Encounter for screening for malignant neoplasm of cervix: Secondary | ICD-10-CM

## 2016-11-23 NOTE — Patient Instructions (Signed)
  Qu es la tendinitis del manguito de los rotadores?    Los tendones son tejidos resistentes que conectan los msculos con los huesos. Hay un grupo de cuatro msculos y sus correspondientes tendones que forman un "manguito". Este manguito rodea la cabeza del hueso de la parte superior del brazo. Ese es el manguito de los rotadores. Conecta la parte superior del brazo con el omplato. Le da estabilidad y fuerza a la articulacin del hombro.  Si los tendones se lesionan o se distienden, pueden irritarse e inflamarse (hincharse). Eso se llama tendinitis. La tendinitis del manguito de los rotadores puede causar dolor en el hombro y prdida de funcionamiento.  Cules son las causas de la tendinitis del manguito de los rotadores?  La tendinitis se produce por lesin o exceso de uso de los tendones del manguito de los rotadores. La causa ms comn de lesin es una actividad en la que el brazo se mueve repetidamente por encima de la cabeza. Pueden ser actividades relacionadas con su trabajo, como alcanzar, empujar o levantar cosas. O pueden ser actividades deportivas, como lanzar algo, nadar o levantar pesas.  Sntomas de la tendinitis del manguito de los rotadores  El sntoma ms comn es dolor en el costado de la parte superior del brazo. El dolor puede empeorar con los movimientos por encima de la cabeza o cuando levanta el brazo por arriba del nivel del hombro. Tambin puede resultarle doloroso dormir apoyado sobre ese hombro.  Tratamiento de la tendinitis del manguito de los rotadores  El tratamiento puede incluir lo siguiente:   Reposo activo. Esto permite que el manguito de los rotadores se recupere. El reposo activo significa usar su brazo y su hombro, pero evitar actividades que causan dolor, como alzar el brazo por encima de la cabeza, o dormir sobre el hombro afectado.   Compresas fras. Colocar una compresa de hielo sobre el hombro puede ayudar a reducir la inflamacin y el dolor.   Medicamentos  analgsicos (calmantes). Los medicamentos analgsicos de venta con o sin receta pueden ayudar a aliviarle el dolor y la inflamacin.   Ejercicios para el brazo y el hombro. Ayudan a mantener la movilidad de la articulacin del hombro mientras se recupera. Tambin ayudan a mejorar la fuerza de los msculos alrededor de la articulacin.  Posibles complicaciones  Puede resultarle tentador dejar de usar el hombro por completo para evitar el dolor. Pero si hace eso, puede desarrollar una afeccin llamada "hombro congelado". Para ayudar a prevenir esto, siga las instrucciones que le den para hacer reposo activo y para hacer ejercicios que ayuden a su hombro a recuperarse.  Cundo llamar a su proveedor de atencin mdica  Llame a su proveedor de atencin mdica de inmediato si nota alguno de los siguientes sntomas:   Fiebre de 100.4F (38C) o superior, o segn le indiquen   Los sntomas no mejoran, o empeoran   Sntomas nuevos   Date Last Reviewed: 04/17/2014   2000-2018 [employer] [address], [place] [address]. Todos los derechos reservados. Esta informacin no pretende sustituir la atencin mdica profesional. Slo su mdico puede diagnosticar y tratar un problema de salud.

## 2016-11-23 NOTE — Progress Notes (Signed)
Have you seen any specialists/other providers since your last visit with [place]?    No    Arm preference verified?   Yes    The patient is due for shingles vaccine/urine micro

## 2016-11-23 NOTE — Progress Notes (Signed)
Subjective:      Patient ID: [name] is a 61 y.o. female     Chief Complaint   Patient presents with   . Hypertension   . Hyperlipidemia        HPI   Patient is a 61 year old Hispanic female with history of uncontrolled diabetes mellitus 2, hypertension, hyperlipidemia who is also followed by cardiologist Dr. Fransisco Beau due to a history of mitral regurgitation who presents today for follow-up of the above.    Since her last visit in July she was diagnosed w/ afib during recent visit with cardiologist Dr. Fransisco Beau on 9/28.  She was started on Eliquis and taken off ASA.      Was seen by podiatrist recently for left foot pain and was given injections, per patient.      DM2 - has not yet scheduled appointment w/ endocrinologist as advised.  Has not been checking glucose.  States her machine broke down and wants new machine.  Denies any polydipsia, polyuria, or paresthesias.  She reports compliance with metformin 1000 mg 2 times a day and also NPH insulin 50 units with breakfast and 24 units with dinner. She denies any polydipsia, polyuria, or paresthesias.    Since her last visit in July her weight has remained fairly stable.  She reports she continues to walk regularly.      BP elevated today.  Not checking BP at home.  She is on therapy with amlodipine 5 mg daily (prescribed by her cardiologist) and also lisinopril 40 mg daily.Patient denies any frequent headaches, chest pain, palpitations, shortness of breath, orthopnea or PND.      She reports compliance with Lipitor. No myalgias or abdominal pain. Last lipid panel done in January showed good control.    Wants order for mammogram as well as referral for colonoscopy and gynecology for pap smear.     The following sections were reviewed this encounter by the provider: Meds    Problems        Past Medical History:   Diagnosis Date   . Diabetes mellitus type 2 in obese    . Diabetic retinopathy 06/28/2013    Of the right eye, per patient   . Glaucoma 06/01/2012   .  Hyperlipidemia    . Hypertension    . Obesity    . Venous insufficiency 09/17/2014    Followed at [facility].  Wears compression stockings.        Social History     Social History   . Marital status: Single     Spouse name: N/A   . Number of children: 3   . 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 Smoking status: Never Smoker   . Smokeless tobacco: Never Used   . Alcohol use No   . Drug use: No   . Sexual activity: Not Currently     Other Topics Concern   . Not on file     Social History Narrative    Originally from [place].  Lives with her daughter.    Exercise: walks frequently       Allergies   Allergen Reactions   . Shellfish-Derived Products        Current Outpatient Prescriptions   Medication Sig Dispense Refill   . amLODIPine (NORVASC) 5 MG tablet Take 5 mg by mouth daily.     Marland Kitchen apixaban (  ELIQUIS) 5 MG Take 5 mg by mouth every 12 (twelve) hours.     Marland Kitchen atorvastatin (LIPITOR) 20 MG tablet Take 1 tablet (20 mg total) by mouth daily. 90 tablet 1   . Calcium Carbonate-Vitamin D (CALCIUM-D PO) Take 1 tablet by mouth daily.     . insulin NPH (HUMULIN,NOVOLIN) 100 UNIT/ML injection Administer 50 units subcutaneously with breakfast and 24 units with dinner 80 mL 1   . Insulin Syringe-Needle U-100 (B-D INS SYR ULTRAFINE 1CC/31G) 31G X 5/16" 1 ML Misc Administer insulin subcutaneously 2 times a day 100 each 1   . latanoprost (XALATAN) 0.005 % ophthalmic solution Place 1 drop into both eyes nightly.        Marland Kitchen lisinopril (PRINIVIL,ZESTRIL) 40 MG tablet Take 1 tablet (40 mg total) by mouth daily. 90 tablet 1   . metFORMIN (GLUCOPHAGE) 1000 MG tablet Take 1 tablet (1,000 mg total) by mouth 2 (two) times daily with meals. 180 tablet 0   . ONE TOUCH ULTRA TEST test strip Check glucose once a day.  ICD-10: E11.9 200 each 1   . Blood Glucose Monitoring Suppl (ONETOUCH ULTRA 2) w/Device Kit Check glucose daily.  ICD-10: E11.9 1 each 0     No  current facility-administered medications for this visit.      Review of Systems   Constitutional: Positive for fatigue.   Respiratory: Negative for chest tightness and shortness of breath.    Cardiovascular: Negative for chest pain and palpitations.   Gastrointestinal: Negative for abdominal pain, constipation, diarrhea, nausea and vomiting.   Endocrine: Negative for polydipsia and polyuria.   Musculoskeletal: Negative for myalgias.   Neurological: Positive for headaches (occasional). Negative for dizziness and light-headedness.         Objective:     Physical Exam   Constitutional: She is oriented to person, place, and time. She appears well-developed. No distress.   Obese by BMI criteria   HENT:   Moist mucous membranes   Eyes: Conjunctivae are normal. No scleral icterus.   Cardiovascular: Normal rate.  Exam reveals no gallop and no friction rub.    No murmur heard.  Irregularly irregular   Pulmonary/Chest: Effort normal. No respiratory distress. She has no wheezes. She has no rales.   Abdominal: Soft. She exhibits no mass. There is no tenderness.   Musculoskeletal: She exhibits no edema.   Neurological: She is alert and oriented to person, place, and time.   Skin: Skin is warm and dry. She is not diaphoretic.   Psychiatric: Her behavior is normal.   Vitals reviewed.     BP 153/74 (BP Site: Right arm, Patient Position: Sitting, Cuff Size: Medium)   Pulse 85   Temp 98.6 F (37 C) (Oral)   Wt 87.1 kg (192 lb)   BMI 30.99 kg/m     Lab Results   Component Value Date    HGBA1C 8.2 (H) 08/23/2016     Lab Results   Component Value Date    WBC 8.3 05/23/2016    HGB 14.5 05/23/2016    HCT 43.2 05/23/2016    MCV 89 05/23/2016    PLT 246 05/23/2016     '  Chemistry        Component Value Date/Time    NA 140 08/23/2016 0000    K 5.0 08/23/2016 0000    CL 100 08/23/2016 0000    CL 104 02/15/2014 0733    CO2 24 08/23/2016 0000    BUN 21 08/23/2016 0000  CREAT 0.78 08/23/2016 0000    CREAT 0.78 02/15/2014 0733    GLU  175 (H) 08/23/2016 0000    GLU 173 (H) 09/04/2015 0319        Component Value Date/Time    CA 9.7 08/23/2016 0000    ALKPHOS 114 08/23/2016 0000    AST 20 08/23/2016 0000    ALT 21 08/23/2016 0000    BILITOTAL 0.4 08/23/2016 0000        Lab Results   Component Value Date    CHOL 136 02/22/2016    CHOL 119 09/03/2015    CHOL 144 12/18/2014     Lab Results   Component Value Date    HDL 52 02/22/2016    HDL 43 09/03/2015    HDL 68 12/18/2014     Lab Results   Component Value Date    LDL 65 02/22/2016    LDL 48 09/03/2015    LDL 56 12/18/2014     Lab Results   Component Value Date    TRIG 95 02/22/2016    TRIG 140 09/03/2015    TRIG 102 12/18/2014     Lab Results   Component Value Date    TSH 1.340 12/18/2014    TSH 1.17 06/28/2013     No results found for: VITD, [id number]       Assessment:     1. Uncontrolled type 2 diabetes mellitus with complication, with long-term current use of insulin  - Uncontrolled on last A1C  - Strongly encourage patient to see endocrinologist and new referral given.   - Recheck A1 to reevaluate  - for now continue current doses of metformin and insulin  - encourage weight loss, ,monitoring carb intake, exercise  - Comprehensive metabolic panel; Future  - Hemoglobin A1C; Future  - Microalbumin, Random Urine; Future  - Ambulatory referral to Endocrinology    2. Essential hypertension  - BP elevated today  - patient to check bp at home and, if frequently above 130/32mmHg, would increase Norvasc  - Comprehensive metabolic panel; Future    3. Mixed hyperlipidemia  - Well-controlled on last lipids done 02/2016  - order for fasting lipid panel given to recheck  - continue current statin for now  - Comprehensive metabolic panel; Future  - Lipid panel; Future    4. Fatty liver    5. Cervical cancer screening  - Ambulatory referral to Gynecology    6. Breast cancer screening  - Mammo Digital Screening Bilateral W Cad    7. Colon cancer screening  - Ambulatory referral to Gastroenterology        Plan:      Plan is as above.      RTC in 3 months for f/u DM2, sooner prn  Standley Dakins, MD

## 2016-11-24 ENCOUNTER — Encounter (INDEPENDENT_AMBULATORY_CARE_PROVIDER_SITE_OTHER): Payer: Self-pay | Admitting: Internal Medicine

## 2016-11-29 ENCOUNTER — Encounter (INDEPENDENT_AMBULATORY_CARE_PROVIDER_SITE_OTHER): Payer: Self-pay | Admitting: Internal Medicine

## 2016-12-01 ENCOUNTER — Telehealth (INDEPENDENT_AMBULATORY_CARE_PROVIDER_SITE_OTHER): Payer: Self-pay | Admitting: Internal Medicine

## 2016-12-01 ENCOUNTER — Other Ambulatory Visit (INDEPENDENT_AMBULATORY_CARE_PROVIDER_SITE_OTHER): Payer: Self-pay | Admitting: Internal Medicine

## 2016-12-01 DIAGNOSIS — E1169 Type 2 diabetes mellitus with other specified complication: Secondary | ICD-10-CM

## 2016-12-01 DIAGNOSIS — E669 Obesity, unspecified: Secondary | ICD-10-CM

## 2016-12-01 MED ORDER — ONETOUCH ULTRA 2 W/DEVICE KIT
PACK | 0 refills | Status: AC
Start: 2016-12-01 — End: 2017-12-01

## 2016-12-01 NOTE — Telephone Encounter (Signed)
I have sent a prescription for the One Touch Ultra glucometer to the pharmacy.  Nursing, can you check with the patient to see which other medication she needs prescribed?  Thanks!

## 2016-12-01 NOTE — Telephone Encounter (Signed)
Please see below.

## 2016-12-01 NOTE — Telephone Encounter (Signed)
*  Pt's daughter Pieter Partridge is calling on pt's behalf to follow up on the One Touch diabetic machine & one other medication that needed to be called into the [facility]  pharmacy for the pt. Requested a call back from Dr. Elaina Pattee nurse if possible.*    Name, strength, directions of requested refill(s):  -One Touch Diabetic Machine    Pharmacy to send refill to or patient to pick up rx from office (mark requested pharmacy in [place]):      [facility] [address] [place] - [address]  [address]  [place] [address]  Phone: [phone] Fax: [phone]    [facility] - [place], [place] - [address] AT [facility]  [address] [place] [address]  Phone: [phone] Fax: [phone]        Please mark "X" next to the preferred call back number:    [phone] until 4pm.      Next visit: 12/14/2016       Thank you!

## 2016-12-02 NOTE — Telephone Encounter (Signed)
Message noted.     Thanks

## 2016-12-02 NOTE — Telephone Encounter (Signed)
Please see below.

## 2016-12-02 NOTE — Telephone Encounter (Signed)
Nurse called [name]. Per [name], her Mother does not any medication. She only needs a new one touch glucometer. [name] was advised that the RX for the glucometer has already been sent to [facility].

## 2016-12-05 ENCOUNTER — Encounter (INDEPENDENT_AMBULATORY_CARE_PROVIDER_SITE_OTHER): Payer: Self-pay | Admitting: Internal Medicine

## 2016-12-06 ENCOUNTER — Encounter (INDEPENDENT_AMBULATORY_CARE_PROVIDER_SITE_OTHER): Payer: Self-pay | Admitting: Internal Medicine

## 2016-12-08 ENCOUNTER — Encounter (INDEPENDENT_AMBULATORY_CARE_PROVIDER_SITE_OTHER): Payer: Self-pay | Admitting: Internal Medicine

## 2016-12-08 DIAGNOSIS — M25551 Pain in right hip: Secondary | ICD-10-CM

## 2016-12-08 DIAGNOSIS — I10 Essential (primary) hypertension: Secondary | ICD-10-CM

## 2016-12-08 DIAGNOSIS — IMO0002 Reserved for concepts with insufficient information to code with codable children: Secondary | ICD-10-CM

## 2016-12-08 DIAGNOSIS — E782 Mixed hyperlipidemia: Secondary | ICD-10-CM

## 2016-12-08 DIAGNOSIS — M25552 Pain in left hip: Secondary | ICD-10-CM

## 2016-12-14 ENCOUNTER — Ambulatory Visit (INDEPENDENT_AMBULATORY_CARE_PROVIDER_SITE_OTHER): Payer: HMO | Admitting: Internal Medicine

## 2016-12-14 ENCOUNTER — Other Ambulatory Visit (INDEPENDENT_AMBULATORY_CARE_PROVIDER_SITE_OTHER): Payer: Self-pay | Admitting: Internal Medicine

## 2016-12-22 ENCOUNTER — Ambulatory Visit: Payer: HMO | Attending: Internal Medicine

## 2016-12-22 DIAGNOSIS — Z1231 Encounter for screening mammogram for malignant neoplasm of breast: Secondary | ICD-10-CM | POA: Insufficient documentation

## 2017-01-06 ENCOUNTER — Other Ambulatory Visit (INDEPENDENT_AMBULATORY_CARE_PROVIDER_SITE_OTHER): Payer: Self-pay | Admitting: Internal Medicine

## 2017-01-06 DIAGNOSIS — R928 Other abnormal and inconclusive findings on diagnostic imaging of breast: Secondary | ICD-10-CM

## 2017-01-13 ENCOUNTER — Other Ambulatory Visit (INDEPENDENT_AMBULATORY_CARE_PROVIDER_SITE_OTHER): Payer: Self-pay | Admitting: Internal Medicine

## 2017-01-13 ENCOUNTER — Ambulatory Visit: Payer: HMO | Attending: Internal Medicine

## 2017-01-13 ENCOUNTER — Ambulatory Visit: Payer: HMO

## 2017-01-13 DIAGNOSIS — R928 Other abnormal and inconclusive findings on diagnostic imaging of breast: Secondary | ICD-10-CM

## 2017-01-18 ENCOUNTER — Other Ambulatory Visit: Payer: HMO

## 2017-01-18 ENCOUNTER — Other Ambulatory Visit (INDEPENDENT_AMBULATORY_CARE_PROVIDER_SITE_OTHER): Payer: Self-pay | Admitting: Internal Medicine

## 2017-01-18 DIAGNOSIS — R928 Other abnormal and inconclusive findings on diagnostic imaging of breast: Secondary | ICD-10-CM

## 2017-01-19 ENCOUNTER — Other Ambulatory Visit (INDEPENDENT_AMBULATORY_CARE_PROVIDER_SITE_OTHER): Payer: Self-pay | Admitting: Internal Medicine

## 2017-01-19 DIAGNOSIS — R928 Other abnormal and inconclusive findings on diagnostic imaging of breast: Secondary | ICD-10-CM

## 2017-01-19 LAB — COMPREHENSIVE METABOLIC PANEL
ALT: 18 IU/L (ref 0–32)
AST (SGOT): 19 IU/L (ref 0–40)
Albumin/Globulin Ratio: 1.4 (ref 1.2–2.2)
Albumin: 4 g/dL (ref 3.6–4.8)
Alkaline Phosphatase: 105 IU/L (ref 39–117)
BUN / Creatinine Ratio: 18 (ref 12–28)
BUN: 12 mg/dL (ref 8–27)
Bilirubin, Total: 0.3 mg/dL (ref 0.0–1.2)
CO2: 25 mmol/L (ref 20–29)
Calcium: 9 mg/dL (ref 8.7–10.3)
Chloride: 101 mmol/L (ref 96–106)
Creatinine: 0.65 mg/dL (ref 0.57–1.00)
EGFR: 111 mL/min/{1.73_m2} (ref >59)
EGFR: 96 mL/min/{1.73_m2} (ref >59)
Globulin, Total: 2.9 g/dL (ref 1.5–4.5)
Glucose: 47 mg/dL — ABNORMAL LOW (ref 65–99)
Potassium: 4.6 mmol/L (ref 3.5–5.2)
Protein, Total: 6.9 g/dL (ref 6.0–8.5)
Sodium: 142 mmol/L (ref 134–144)

## 2017-01-19 LAB — HEMOGLOBIN A1C: Hemoglobin A1C: 7 % — ABNORMAL HIGH (ref 4.8–5.6)

## 2017-01-19 LAB — LIPID PANEL
Cholesterol / HDL Ratio: 2 ratio (ref 0.0–4.4)
Cholesterol: 131 mg/dL (ref 100–199)
HDL: 65 mg/dL (ref >39)
LDL Calculated: 54 mg/dL (ref 0–99)
Triglycerides: 61 mg/dL (ref 0–149)
VLDL Calculated: 12 mg/dL (ref 5–40)

## 2017-01-19 LAB — MICROALBUMIN, RANDOM URINE
Creatinine, UR: 89.2 mg/dL
Microalb/Crt. Ratio: 14.3 mg/g creat (ref 0.0–30.0)
Microalbumin, UR: 12.8 ug/mL

## 2017-01-19 LAB — SEDIMENTATION RATE: Sed Rate: 10 mm/hr (ref 0–40)

## 2017-01-20 ENCOUNTER — Encounter (INDEPENDENT_AMBULATORY_CARE_PROVIDER_SITE_OTHER): Payer: Self-pay | Admitting: Internal Medicine

## 2017-01-20 ENCOUNTER — Ambulatory Visit (INDEPENDENT_AMBULATORY_CARE_PROVIDER_SITE_OTHER): Payer: HMO | Admitting: Internal Medicine

## 2017-01-20 VITALS — BP 134/78 | HR 83 | Temp 97.9°F | Wt 196.4 lb

## 2017-01-20 DIAGNOSIS — E782 Mixed hyperlipidemia: Secondary | ICD-10-CM

## 2017-01-20 DIAGNOSIS — K760 Fatty (change of) liver, not elsewhere classified: Secondary | ICD-10-CM

## 2017-01-20 DIAGNOSIS — R058 Other specified cough: Secondary | ICD-10-CM

## 2017-01-20 DIAGNOSIS — I10 Essential (primary) hypertension: Secondary | ICD-10-CM

## 2017-01-20 DIAGNOSIS — R05 Cough: Secondary | ICD-10-CM

## 2017-01-20 DIAGNOSIS — E669 Obesity, unspecified: Secondary | ICD-10-CM

## 2017-01-20 DIAGNOSIS — R748 Abnormal levels of other serum enzymes: Secondary | ICD-10-CM

## 2017-01-20 DIAGNOSIS — E1169 Type 2 diabetes mellitus with other specified complication: Secondary | ICD-10-CM

## 2017-01-20 MED ORDER — LISINOPRIL 40 MG PO TABS
40.0000 mg | ORAL_TABLET | Freq: Every day | ORAL | 1 refills | Status: DC
Start: 2017-01-20 — End: 2017-05-15

## 2017-01-20 MED ORDER — INSULIN NPH (HUMAN) (ISOPHANE) 100 UNIT/ML SC SUSP
SUBCUTANEOUS | 1 refills | Status: DC
Start: 2017-01-20 — End: 2017-05-15

## 2017-01-20 MED ORDER — METFORMIN HCL 1000 MG PO TABS
1000.0000 mg | ORAL_TABLET | Freq: Two times a day (BID) | ORAL | 0 refills | Status: DC
Start: 2017-01-20 — End: 2017-05-15

## 2017-01-20 MED ORDER — FLUTICASONE PROPIONATE 50 MCG/ACT NA SUSP
1.00 | Freq: Every day | NASAL | 1 refills | Status: DC
Start: 2017-01-20 — End: 2017-09-13

## 2017-01-20 MED ORDER — ONETOUCH ULTRA BLUE VI STRP
ORAL_STRIP | 1 refills | Status: AC
Start: 2017-01-20 — End: 2018-01-20

## 2017-01-20 MED ORDER — ATORVASTATIN CALCIUM 20 MG PO TABS
20.0000 mg | ORAL_TABLET | Freq: Every day | ORAL | 1 refills | Status: DC
Start: 2017-01-20 — End: 2017-05-15

## 2017-01-20 MED ORDER — BENZONATATE 100 MG PO CAPS
100.00 mg | ORAL_CAPSULE | Freq: Three times a day (TID) | ORAL | 0 refills | Status: DC | PRN
Start: 2017-01-20 — End: 2017-03-15

## 2017-01-20 MED ORDER — INSULIN SYRINGE-NEEDLE U-100 31G X 5/16" 1 ML MISC
1 refills | Status: DC
Start: 2017-01-20 — End: 2017-05-15

## 2017-01-20 NOTE — Progress Notes (Signed)
Have you seen any specialists/other providers since your last visit with us?    No    Arm preference verified?   Yes    The patient is due for shingles vaccine

## 2017-01-20 NOTE — Patient Instructions (Signed)
Para la tos, pensamos que puede ser por [employer], multimedia.     Tome Claritin o Zyrtec 10mg  diarios.      Use Flonase una vez al dia.     Puede utilizar tessalon perles cada 8 horas.         Benzonatate capsules  Brand Names: Tessalon Perles, Zonatuss  What is this medicine?  BENZONATATE (ben ZOE na tate) is used to treat cough.  How should I use this medicine?  Take this medicine by mouth with a glass of water. Follow the directions on the prescription label. Avoid breaking, chewing, or sucking the capsule, as this can cause serious side effects. Take your medicine at regular intervals. Do not take your medicine more often than directed.  Talk to your pediatrician regarding the use of this medicine in children. While this drug may be prescribed for children as young as 18 years old for selected conditions, precautions do apply.  What side effects may I notice from receiving this medicine?  Side effects that you should report to your doctor or health care professional as soon as possible:   allergic reactions like skin rash, itching or hives, swelling of the face, lips, or tongue   breathing problems   chest pain   confusion or hallucinations   irregular heartbeat   numbness of mouth or throat   seizures  Side effects that usually do not require medical attention (report to your doctor or health care professional if they continue or are bothersome):   burning feeling in the eyes   constipation   headache   nasal congestion   stomach upset  What may interact with this medicine?  Do not take this medicine with any of the following medications:   MAOIs like Carbex, Eldepryl, Marplan, Nardil, and Parnate  What if I miss a dose?  If you miss a dose, take it as soon as you can. If it is almost time for your next dose, take only that dose. Do not take double or extra doses.  Where should I keep my medicine?  Keep out of the reach of children.  Store at room temperature between 15 and 30 degrees C (59 and 86 degrees F).  Keep tightly closed. Protect from light and moisture. Throw away any unused medicine after the expiration date.  What should I tell my health care provider before I take this medicine?  They need to know if you have any of these conditions:   kidney or liver disease   an unusual or allergic reaction to benzonatate, anesthetics, other medicines, foods, dyes, or preservatives   pregnant or trying to get pregnant   breast-feeding  What should I watch for while using this medicine?  Tell your doctor if your symptoms do not improve or if they get worse. If you have a high fever, skin rash, or headache, see your health care professional.  You may get drowsy or dizzy. Do not drive, use machinery, or do anything that needs mental alertness until you know how this medicine affects you. Do not sit or stand up quickly, especially if you are an older patient. This reduces the risk of dizzy or fainting spells.  NOTE:This sheet is a summary. It may not cover all possible information. If you have questions about this medicine, talk to your doctor, pharmacist, or health care provider. Copyright 2018 [employer]

## 2017-01-20 NOTE — Progress Notes (Signed)
Subjective:      Patient ID: [name] is a 61 y.o. female     Chief Complaint   Patient presents with   . Diabetes     f/u   . Hypertension     f/u   . Cough        HPI   Patient is a 61 year old Hispanic female with history of uncontrolled diabetes mellitus 2, hypertension, hyperlipidemia, afib followed by cardiologist Dr. Fransisco Beau, among others, who is here today for the above.     Patient reports that for the last 2 weeks she has had a dry cough. Also reports having postnasal drip.  Denies any fevers, chills, sweats, nasal/sinus congestion, ear pain.  Reports itchy eyes.  Has tried OTC cough syrup w/o improvement. Feels cough is worsening.      DM2 - Recent A1C much improved at 7%. Reports compliance w/ insulin and metformin. Brings in glucometer. Has some episodes of hypoglycemia but readings usually around 150's-200's. Reports watching carbohydrate intake.  Denies polydipsia, polyuria or paresthesias. She has an appointment with endocrinologist Dr. Benjiman Core on 03/17/16.     Hypertension - Reports compliance w/ Norvasc 10mg  daily and lisinopril 40mg  daily.  Reports checking BP at home, readings in 120's/70'smmHg, per patient.  Patient denies any frequent headaches, chest pain, palpitations, shortness of breath, orthopnea or PND.      Hyperlipidemia - Reports compliance w/ Lipitor 20mg  daily. Denies any myalgias or abdominal pain.     The following sections were reviewed this encounter by the provider:   Allergies  Meds  Problems       Past Medical History:   Diagnosis Date   . Diabetes mellitus type 2 in obese    . Diabetic retinopathy 06/28/2013    Of the right eye, per patient   . Glaucoma 06/01/2012   . Hyperlipidemia    . Hypertension    . Obesity    . Venous insufficiency 09/17/2014    Followed at [facility].  Wears compression stockings.        Social History     Social History   . Marital status: Single     Spouse name: N/A   . Number of children: 3   . Years of education: N/A      Occupational History   . 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 Smoking status: Never Smoker   . Smokeless tobacco: Never Used   . Alcohol use No   . Drug use: No   . Sexual activity: Not Currently     Other Topics Concern   . Not on file     Social History Narrative    Originally from [place].  Lives with her daughter.    Exercise: walks frequently       Allergies   Allergen Reactions   . Shellfish-Derived Products        Current Outpatient Prescriptions   Medication Sig Dispense Refill   . apixaban (ELIQUIS) 5 MG Take 5 mg by mouth every 12 (twelve) hours.     Marland Kitchen atorvastatin (LIPITOR) 20 MG tablet Take 1 tablet (20 mg total) by mouth daily. 90 tablet 1   . Blood Glucose Monitoring Suppl (ONETOUCH ULTRA 2) w/Device Kit Check glucose daily.  ICD-10: E11.9 1 each 0   . Calcium Carbonate-Vitamin D (CALCIUM-D PO) Take 1 tablet by mouth daily.     . insulin NPH (HUMULIN,NOVOLIN) 100 UNIT/ML injection Administer 50 units  subcutaneously with breakfast and 24 units with dinner 80 mL 1   . Insulin Syringe-Needle U-100 (B-D INS SYR ULTRAFINE 1CC/31G) 31G X 5/16" 1 ML Misc Administer insulin subcutaneously 2 times a day 100 each 1   . latanoprost (XALATAN) 0.005 % ophthalmic solution Place 1 drop into both eyes nightly.        Marland Kitchen lisinopril (PRINIVIL,ZESTRIL) 40 MG tablet Take 1 tablet (40 mg total) by mouth daily. 90 tablet 1   . metFORMIN (GLUCOPHAGE) 1000 MG tablet Take 1 tablet (1,000 mg total) by mouth 2 (two) times daily with meals. 180 tablet 0   . ONE TOUCH ULTRA TEST test strip Check glucose once a day.  ICD-10: E11.9 200 each 1   . amLODIPine (NORVASC) 10 MG tablet Take 1 tablet by mouth daily.       No current facility-administered medications for this visit.        Review of Systems   Constitutional: Negative for fatigue.   Respiratory: Positive for cough (dry). Negative for chest tightness and shortness of breath.    Cardiovascular: Negative for chest pain and palpitations.   Gastrointestinal:  Negative for abdominal pain, constipation, diarrhea, nausea and vomiting.   Endocrine: Negative for polydipsia and polyuria.   Musculoskeletal: Negative for myalgias.   Neurological: Negative for dizziness, light-headedness and headaches.         Objective:   BP 134/78 (BP Site: Right arm, Patient Position: Sitting, Cuff Size: Medium)   Pulse 83   Temp 97.9 F (36.6 C) (Oral)   Wt 89.1 kg (196 lb 6.4 oz)   BMI 31.70 kg/m     Physical Exam   Constitutional: She is oriented to person, place, and time. She appears well-developed. No distress.   HENT:   Right Ear: No tenderness. Tympanic membrane is not erythematous and not bulging.   Left Ear: No tenderness. Tympanic membrane is not erythematous and not bulging.   Nose: Right sinus exhibits no maxillary sinus tenderness and no frontal sinus tenderness. Left sinus exhibits no maxillary sinus tenderness and no frontal sinus tenderness.   Mouth/Throat: No oropharyngeal exudate.   Moist mucous membranes   Eyes: Conjunctivae are normal. No scleral icterus.   Neck: Neck supple.   Cardiovascular: Normal rate and regular rhythm.  Exam reveals no gallop and no friction rub.    No murmur heard.  Pulmonary/Chest: Effort normal. No respiratory distress. She has no wheezes. She has no rales.   Abdominal: Soft. She exhibits no mass. There is no tenderness.   Musculoskeletal: She exhibits no edema.   Lymphadenopathy:     She has no cervical adenopathy.   Neurological: She is alert and oriented to person, place, and time.   Skin: Skin is warm and dry. She is not diaphoretic.   Psychiatric: Her behavior is normal.   Vitals reviewed.      Lab Results   Component Value Date    HGBA1C 7.0 (H) 01/18/2017     Lab Results   Component Value Date    WBC 8.3 05/23/2016    HGB 14.5 05/23/2016    HCT 43.2 05/23/2016    MCV 89 05/23/2016    PLT 246 05/23/2016     '  Chemistry        Component Value Date/Time    NA 142 01/18/2017 0000    K 4.6 01/18/2017 0000    CL 101 01/18/2017 0000    CL  104 02/15/2014 0733    CO2 25 01/18/2017 0000  BUN 12 01/18/2017 0000    CREAT 0.65 01/18/2017 0000    CREAT 0.78 02/15/2014 0733    GLU 47 (L) 01/18/2017 0000    GLU 173 (H) 09/04/2015 0319        Component Value Date/Time    CA 9.0 01/18/2017 0000    ALKPHOS 105 01/18/2017 0000    AST 19 01/18/2017 0000    ALT 18 01/18/2017 0000    BILITOTAL 0.3 01/18/2017 0000          Lab Results   Component Value Date    CHOL 131 01/18/2017    CHOL 136 02/22/2016    CHOL 119 09/03/2015     Lab Results   Component Value Date    HDL 65 01/18/2017    HDL 52 02/22/2016    HDL 43 09/03/2015     Lab Results   Component Value Date    LDL 54 01/18/2017    LDL 65 02/22/2016    LDL 48 09/03/2015     Lab Results   Component Value Date    TRIG 61 01/18/2017    TRIG 95 02/22/2016    TRIG 140 09/03/2015     Lab Results   Component Value Date    TSH 1.340 12/18/2014    TSH 1.17 06/28/2013     No results found for: VITD, 25HYDROXYDTO     Assessment:      1. Diabetes mellitus type 2 in obese  - Now under much improved control.   - She will continue current metformin and insulin while she is seen by endocrinologist in February  - Encouraged continuing to monitor carbohydrate intake and exercise regularly  - Encouraged continuing to monitor glucose at home  - metFORMIN (GLUCOPHAGE) 1000 MG tablet; Take 1 tablet (1,000 mg total) by mouth 2 (two) times daily with meals.  Dispense: 180 tablet; Refill: 0  - insulin NPH (HUMULIN,NOVOLIN) 100 UNIT/ML injection; Administer 50 units subcutaneously with breakfast and 24 units with dinner  Dispense: 80 mL; Refill: 1  - Insulin Syringe-Needle U-100 (B-D INS SYR ULTRAFINE 1CC/31G) 31G X 5/16" 1 ML Misc; Administer insulin subcutaneously 2 times a day  Dispense: 100 each; Refill: 1  - ONE TOUCH ULTRA TEST test strip; Check glucose once a day.  ICD-10: E11.9  Dispense: 200 each; Refill: 1    2. Essential hypertension  - Well-controlled with blood pressure at goal of under 130/32mmHg  - Continue current  medications  - Encourage low sodium diet and routine exercise as tolerated  - lisinopril (PRINIVIL,ZESTRIL) 40 MG tablet; Take 1 tablet (40 mg total) by mouth daily.  Dispense: 90 tablet; Refill: 1    3. Mixed hyperlipidemia  - Well-controlled on last lipid panel that was last done earlier this month  - Continue current statin medication  - Encourage heart healthy diet such as the mediterranean diet together with aerobic exercise for 30-45 minutes 4-5 days a week as tolerated  - atorvastatin (LIPITOR) 20 MG tablet; Take 1 tablet (20 mg total) by mouth daily.  Dispense: 90 tablet; Refill: 1    4. Dry cough  - Suspect allergic in nature as there are no symptoms of infection  - Advised to start Zyrtec or Claritin 10mg  daily  - Will also start Flonase daily  - May use Tessalon perles every 8 hours as needed  - Advised patient to call if no improvement in symptoms over the next 5-7 days  - benzonatate (TESSALON PERLES) 100 MG capsule; Take 1 capsule (  100 mg total) by mouth 3 (three) times daily as needed for Cough.  Dispense: 30 capsule; Refill: 0  - fluticasone (FLONASE) 50 MCG/ACT nasal spray; 1 spray by Nasal route daily.2 sprays each nostril daily  Dispense: 1 Bottle; Refill: 1        Plan:     Plan is as above.     Risk & Benefits of the new medication(s) were explained to the patient (and family) who verbalized understanding & agreed to the treatment plan. Patient (family) encouraged to contact me/clinical staff with any questions/concerns    Encouraged patient to follow DASH diet  Encouraged aerobic exercise for 30-45 minutes 4-5 days a week as tolerated  Encouraged monitoring carbohydrate intake  Discussed goal blood pressure with patient  Discussed most recent lab results with patient.     Return in about 3 months (around 04/20/2017) for f/u hypertension.    Standley Dakins, MD

## 2017-01-24 ENCOUNTER — Encounter (INDEPENDENT_AMBULATORY_CARE_PROVIDER_SITE_OTHER): Payer: Self-pay | Admitting: Internal Medicine

## 2017-01-25 ENCOUNTER — Other Ambulatory Visit (INDEPENDENT_AMBULATORY_CARE_PROVIDER_SITE_OTHER): Payer: Self-pay | Admitting: Internal Medicine

## 2017-01-25 ENCOUNTER — Ambulatory Visit: Payer: HMO

## 2017-01-25 ENCOUNTER — Ambulatory Visit: Payer: HMO | Attending: Internal Medicine

## 2017-01-25 ENCOUNTER — Ambulatory Visit: Payer: Self-pay

## 2017-01-25 DIAGNOSIS — R928 Other abnormal and inconclusive findings on diagnostic imaging of breast: Secondary | ICD-10-CM | POA: Insufficient documentation

## 2017-01-26 LAB — LAB USE ONLY - HISTORICAL SURGICAL PATHOLOGY

## 2017-03-10 ENCOUNTER — Encounter (INDEPENDENT_AMBULATORY_CARE_PROVIDER_SITE_OTHER): Payer: Self-pay | Admitting: Internal Medicine

## 2017-03-15 ENCOUNTER — Encounter (INDEPENDENT_AMBULATORY_CARE_PROVIDER_SITE_OTHER): Payer: Self-pay | Admitting: Specialist

## 2017-03-15 ENCOUNTER — Ambulatory Visit (INDEPENDENT_AMBULATORY_CARE_PROVIDER_SITE_OTHER): Payer: HMO | Admitting: Specialist

## 2017-03-15 VITALS — BP 133/70 | HR 97 | Wt 193.0 lb

## 2017-03-15 DIAGNOSIS — Z794 Long term (current) use of insulin: Secondary | ICD-10-CM

## 2017-03-15 DIAGNOSIS — E119 Type 2 diabetes mellitus without complications: Secondary | ICD-10-CM

## 2017-03-15 NOTE — Patient Instructions (Signed)
You have type 2 diabetes. Your HbA1c in clinic today is 7.0%         Goal of HbA1c is < 7% to prevent complications to eye, kidney, nerve . Also reduces the risk for cardiovascular disease.      Please check glucose before taking Insulin    Continue novolog mix 70/30 insulin 52 units before breakfast and 32 units before dinner      If glucose is < 110- take 1/2 dose of insulin    If glucose is < 80- dont take insulin, please take 4 glucose tablets or drink 8 oz juice and recheck in 20 minutes     Please continue metformin as  1000 mg twice daily        Due to lack of glucose data, I am not making any changes today    Please check glucose before breakfast and before dinner ( ie. before taking insulin)    Please bring your glucose meter and follow up in clinic in  2-3 months after labs are done     Please walk daily as tolerated upto 30 minutes daily.    Pease limit your carbohydrate in your meals.    Please attend diabetes education/diet classes at your convenience.    Please visit American Diabetes Association website for more information.    Also please wear a medical alert.    You should have eye exam once a year and foot exam once a year for diabetes related care.

## 2017-03-15 NOTE — Progress Notes (Signed)
Chief complaint   Type 2 diabetes - uncontrolled   Hypertension    HISTORY OF PRESENT ILLNESS:  [name] is a pleasant 62 year old who presents in our clinic for  Follow up  visit.  She was last evaluated in June 2017      She informs me she was diagnosed with type 2 diabetes in  1993.  She currently manages her type 2 diabetes by taking Humulin N   mix insulin 52 units prior to breakfast and 32 units prior to dinner.  In  addition, she is on metformin 1000 mg twice daily.      In terms of glycemic control,  her most recent hemoglobin A1c has improved to 7% compare to 8.2%before     In terms of glucose values, she checks infrequently and informs me that she has widely fluctuating glucose values    In terms of contributing factors  to type 2 diabetes, her family history of  type 2 diabetes, sedentary lifestyle, progression of diabetes,  indiscretions to diet.     In terms of associated microvascular complications from type 2 diabetes,  her last urine microalbumin level was normal .In terms of peripheral  neuropathy, she does complain of some paresthesias, numbness in her lower  extremities.  No complications of open ulcers, lesions, or amputations.     In terms of retinopathy, the patient tells me she has mild retinopathy.  I  do not have a recent retinopathy surveillance report.  She denies any eye  symptoms today.     In terms of associated conditions with her type 2 diabetes, the patient has  hypertension, hyperlipidemia, and follows with her primary care physician.   She has no known coronary artery disease.  She denies chest pain, shortness  of breath with exertion.     In terms of associated symptoms of hyperglycemia, she denies polyuria,  polyphagia, or polydipsia.     REVIEW OF SYSTEMS:  As mentioned above.  In addition, denies nausea, vomiting, abdominal pain,  hematuria, dysuria, chest pain, shortness of breath with exertion.  Denies  lower extremity pain or swelling.  Denies polyuria, polyphagia,  or  polydipsia.  Denies paresthesias, numbness in her lower extremities.   Denies hair or skin changes, vision changes, difficulty with ambulation or  joint pain.  Denies chest pain, shortness of breath with exertion.    Denies any mood changes.  Denies any taste  changes. All other  systems reviewed and were negative.     PAST MEDICAL HISTORY,FAMILY HISTORY,PERSONAL AND SOCIAL HISTORY were reviewed and updated in epic care     PHYSICAL EXAMINATION:  VITAL SIGNS: reviewed and updated in epic care  GENERAL:  Pleasant, conversant, in no acute distress.  NECK:  Supple, no lymphadenopathy.  Thyroid palpable, not enlarged.  EXTREMITIES:  No edema seen.  No tremors seen.   SKIN:  Warm and dry.  No rash seen.  PSYCHIATRIC:  Mood and affect normal.     LABORATORY DATA:    Component      Latest Ref Rng & Units 01/18/2017 01/18/2017          12:00 AM 12:00 AM   Glucose      65 - 99 mg/dL  47 (L)   BUN      8 - 27 mg/dL  12   Creatinine      4.78 - 1.00 mg/dL  2.95   EGFR      >62 ZH/YQM/5.78  96   BUN/Creatinine Ratio  12 - 28  18   Sodium      134 - 144 mmol/L  142   Potassium      3.5 - 5.2 mmol/L  4.6   Chloride      96 - 106 mmol/L  101   Carbon Dioxide, Whole Blood      20 - 29 mmol/L  25   Calcium      8.7 - 10.3 mg/dL  9.0   Protein, Total      6.0 - 8.5 g/dL  6.9   Albumin      3.6 - 4.8 g/dL  4.0   Globulin, Total      1.5 - 4.5 g/dL  2.9   Albumin/Globulin Ratio      1.2 - 2.2  1.4   Bilirubin, Total      0.0 - 1.2 mg/dL  0.3   Alkaline Phosphatase      39 - 117 IU/L  105   AST Whole Blood      0 - 40 IU/L  19   ALT, Whole Blood      0 - 32 IU/L  18   Cholesterol      100 - 199 mg/dL 161    Triglycerides      0 - 149 mg/dL 61    HDL      >09 mg/dL 65    VLDL Cholesterol Cal      5 - 40 mg/dL 12    Calculated LDL      0 - 99 mg/dL 54    CHOL/HDL Ratio      0.0 - 4.4 ratio 2.0    Creatinine, UR      Not Estab. mg/dL 60.4    Microalbumin, UR      Not Estab. ug/mL 12.8    Microalb/Crt. Ratio      0.0 - 30.0 mg/g  creat 14.3    Hemoglobin A1C      4.8 - 5.6 % 7.0 (H)         ASSESSMENT AND PLAN:  Ms. Sosinski is a pleasant 62 year old who presents in our clinic for  Follow up  visit.     1.  Type 2 diabetes.  Hemoglobin A1c is at goal, and  has improved  compared to before.     I reviewed with her goal of A1c is less than 7% to prevent microvascular  complications of neuropathy, nephropathy and retinopathy and to reduce the  risk of cardiovascular disease.     I did not recommend any further changes to insulin today but emphasized the importance of checking glucose values      I  did recommend her to check her blood glucose values before taking insulin in the morning and then again in the evening    Continue Humulin N  insulin 52 units before breakfast and 32 units before dinner      If glucose is < 110- take 1/2 dose of insulin    If glucose is < 80- dont take insulin, please take 4 glucose tablets or drink 8 oz juice and recheck in 20 minutes     Please continue metformin as  1000 mg twice daily     In addition, I reviewed dietary recommendations, low carbohydrate meals,  regular exercise as tolerated to improve glycemic control.     For hypoglycemia, I recommended to carry glucose tablets, wear a medical  alert, to check glucose prior to taking insulin, driving and prolonged  physical activity.       Diabetes related complications:  A. Retinopathy.  Per the patient, she has right-sided retinopathy, follows  with a retina specialist tight glycemic control was emphasized.  B. Neuropathy.  She has some symptoms of peripheral neuropathy.  Discussed  diabetic foot hygiene.  C. Nephropathy.  She is currently on ACE inhibitor therapy.  Tighter  glycemic control was emphasized.     2.  Hypertension.   She is on lisinopril and beta blocker.       3.  Hyperlipidemia.  Goal of LDL is less 100.  She is currently on statin  therapy.  No known coronary disease.     It was a pleasure taking care of her in our clinic today.  We will  follow  up in 2-3 months after labs are done

## 2017-03-17 ENCOUNTER — Ambulatory Visit (INDEPENDENT_AMBULATORY_CARE_PROVIDER_SITE_OTHER): Payer: HMO | Admitting: Specialist

## 2017-03-24 ENCOUNTER — Encounter (INDEPENDENT_AMBULATORY_CARE_PROVIDER_SITE_OTHER): Payer: Self-pay | Admitting: Internal Medicine

## 2017-05-15 ENCOUNTER — Encounter (INDEPENDENT_AMBULATORY_CARE_PROVIDER_SITE_OTHER): Payer: Self-pay | Admitting: Internal Medicine

## 2017-05-15 ENCOUNTER — Ambulatory Visit (INDEPENDENT_AMBULATORY_CARE_PROVIDER_SITE_OTHER): Payer: HMO | Admitting: Internal Medicine

## 2017-05-15 VITALS — BP 146/81 | HR 88 | Temp 98.2°F | Resp 18 | Ht 64.0 in | Wt 192.8 lb

## 2017-05-15 DIAGNOSIS — E1169 Type 2 diabetes mellitus with other specified complication: Secondary | ICD-10-CM

## 2017-05-15 DIAGNOSIS — I10 Essential (primary) hypertension: Secondary | ICD-10-CM

## 2017-05-15 DIAGNOSIS — E782 Mixed hyperlipidemia: Secondary | ICD-10-CM

## 2017-05-15 DIAGNOSIS — E669 Obesity, unspecified: Secondary | ICD-10-CM

## 2017-05-15 MED ORDER — INSULIN SYRINGE-NEEDLE U-100 31G X 5/16" 1 ML MISC
1 refills | Status: DC
Start: 2017-05-15 — End: 2018-01-08

## 2017-05-15 MED ORDER — HYDROCHLOROTHIAZIDE 12.5 MG PO TABS
12.5000 mg | ORAL_TABLET | Freq: Every day | ORAL | 3 refills | Status: DC
Start: 2017-05-15 — End: 2017-06-13

## 2017-05-15 MED ORDER — INSULIN NPH (HUMAN) (ISOPHANE) 100 UNIT/ML SC SUSP
SUBCUTANEOUS | 0 refills | Status: DC
Start: 2017-05-15 — End: 2017-09-07

## 2017-05-15 MED ORDER — METFORMIN HCL 1000 MG PO TABS
1000.0000 mg | ORAL_TABLET | Freq: Two times a day (BID) | ORAL | 0 refills | Status: DC
Start: 2017-05-15 — End: 2017-08-17

## 2017-05-15 MED ORDER — ATORVASTATIN CALCIUM 20 MG PO TABS
20.0000 mg | ORAL_TABLET | Freq: Every day | ORAL | 1 refills | Status: DC
Start: 2017-05-15 — End: 2017-09-07

## 2017-05-15 MED ORDER — LISINOPRIL 40 MG PO TABS
40.0000 mg | ORAL_TABLET | Freq: Every day | ORAL | 1 refills | Status: DC
Start: 2017-05-15 — End: 2017-09-07

## 2017-05-15 NOTE — Progress Notes (Signed)
Have you seen any specialists/other providers since your last visit with [place]?    Yes, Dr. Benjiman Core    Arm preference verified?   Yes    The patient is due for eye exam, pap smear and shingles vaccine

## 2017-05-15 NOTE — Progress Notes (Signed)
Subjective:      Patient ID: [name] is a 62 y.o. female     Chief Complaint   Patient presents with   . Hypertension     f/u   . Hyperlipidemia     f/u        HPI   Patient is a 62 year old Hispanic female with history of uncontrolled diabetes mellitus 2, hypertension, hyperlipidemia, afib followed by cardiologist Dr. Fransisco Beau, among others, who is here today for the above.     Since her last visit with me in December 2018.  She has lost about 4 pounds.  In trying to monitor her carbohydrate intake and states that she has been walking regularly.    DM2 - Recent A1C much improved at 7% .  Last check in December 2018. Now followed by endocrinologist Dr. Benjiman Core.  He reports compliance with insulin and metformin as below.  She reports she has been checking her glucose at home but does not bring in the readings.  She was seen by her retina specialist over the last few months due to history of retinopathy.  She is requesting prescriptions for insulin and metformin while she is seen by her endocrinologist.    Hypertension - Reports compliance w/ Norvasc 10mg  daily and lisinopril 40mg  daily.  Reports checking BP at home, readings in 140s systolic and 60s to 70s diastolic.  She reports she has been having increased lower extremity edema over the last few months and has also been having some increased shortness of breath on exertion.  Patient denies any frequent headaches, chest pain, palpitations, orthopnea or PND.      Hyperlipidemia - Reports compliance w/ Lipitor 20mg  daily.  Last lipid panel done in December 2018 showed good control.  Denies any myalgias or abdominal pain.     Patient has lab orders for a urine microalbumin, complete metabolic panel and also A1c to be done in May for her endocrinologist.    The following sections were reviewed this encounter by the provider:   Allergies  Meds  Problems         Past Medical History:   Diagnosis Date   . Diabetes mellitus type 2 in obese    . Diabetic retinopathy  06/28/2013    Of the right eye, per patient   . Glaucoma 06/01/2012   . Hyperlipidemia    . Hypertension    . Obesity    . Venous insufficiency 09/17/2014    Followed at [facility].  Wears compression stockings.        Social History     Social History   . Marital status: Single     Spouse name: N/A   . Number of children: 3   . 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 Smoking status: Never Smoker   . Smokeless tobacco: Never Used   . Alcohol use No   . Drug use: No   . Sexual activity: Not Currently     Other Topics Concern   . Not on file     Social History Narrative    Originally from [place].  Lives with her daughter.    Exercise: walks frequently       Allergies   Allergen Reactions   . Shellfish-Derived Products        Current Outpatient Prescriptions   Medication Sig Dispense Refill   .  amLODIPine (NORVASC) 10 MG tablet Take 1 tablet by mouth daily.     Marland Kitchen apixaban (ELIQUIS) 5 MG Take 5 mg by mouth every 12 (twelve) hours.     . Blood Glucose Monitoring Suppl (ONETOUCH ULTRA 2) w/Device Kit Check glucose daily.  ICD-10: E11.9 1 each 0   . Calcium Carbonate-Vitamin D (CALCIUM-D PO) Take 1 tablet by mouth daily.     . fluticasone (FLONASE) 50 MCG/ACT nasal spray 1 spray by Nasal route daily.2 sprays each nostril daily 1 Bottle 1   . insulin NPH (HUMULIN,NOVOLIN) 100 UNIT/ML injection Administer 50 units subcutaneously with breakfast and 24 units with dinner 80 mL 1   . Insulin Syringe-Needle U-100 (B-D INS SYR ULTRAFINE 1CC/31G) 31G X 5/16" 1 ML Misc Administer insulin subcutaneously 2 times a day 100 each 1   . latanoprost (XALATAN) 0.005 % ophthalmic solution Place 1 drop into both eyes nightly.        . metFORMIN (GLUCOPHAGE) 1000 MG tablet Take 1 tablet (1,000 mg total) by mouth 2 (two) times daily with meals 180 tablet 0   . ONE TOUCH ULTRA TEST test strip Check glucose once a day.  ICD-10: E11.9 200 each 1   . TRAVATAN Z 0.004  % Solution ophthalmic solution      . atorvastatin (LIPITOR) 20 MG tablet Take 1 tablet (20 mg total) by mouth daily 90 tablet 1   . lisinopril (PRINIVIL,ZESTRIL) 40 MG tablet Take 1 tablet (40 mg total) by mouth daily 90 tablet 1     No current facility-administered medications for this visit.        Review of Systems   Constitutional: Positive for fatigue.   Respiratory: Negative for chest tightness and shortness of breath.    Cardiovascular: Positive for chest pain (occasional twinging sensations). Negative for palpitations.   Gastrointestinal: Negative for abdominal pain, constipation, diarrhea, nausea and vomiting.   Endocrine: Negative for polydipsia and polyuria.   Musculoskeletal: Negative for myalgias.   Neurological: Negative for dizziness, light-headedness and headaches.         Objective:   BP 146/81 (BP Site: Right arm, Patient Position: Sitting, Cuff Size: Medium)   Pulse 88   Temp 98.2 F (36.8 C) (Oral)   Resp 18   Ht 1.626 m (5\' 4" )   Wt 87.5 kg (192 lb 12.8 oz)   BMI 33.09 kg/m     Physical Exam   Constitutional: She is oriented to person, place, and time. She appears well-developed. No distress.   HENT:   Moist mucous membranes   Eyes: Conjunctivae are normal. No scleral icterus.   Cardiovascular: Normal rate and regular rhythm.  Exam reveals no gallop and no friction rub.    No murmur heard.  Pulmonary/Chest: Effort normal. No respiratory distress. She has no wheezes. She has no rales.   Abdominal: Soft. She exhibits no mass. There is no tenderness.   Musculoskeletal: She exhibits edema (+1 bilateral lower extremity edema to mid-tibia).   Neurological: She is alert and oriented to person, place, and time.   Skin: Skin is warm and dry. She is not diaphoretic.   Psychiatric: Her behavior is normal.   Vitals reviewed.      Lab Results   Component Value Date    HGBA1C 7.0 (H) 01/18/2017     Lab Results   Component Value Date    WBC 8.3 05/23/2016    HGB 14.5 05/23/2016    HCT 43.2 05/23/2016     MCV 89 05/23/2016  PLT 246 05/23/2016     '  Chemistry        Component Value Date/Time    NA 142 01/18/2017 0000    K 4.6 01/18/2017 0000    CL 101 01/18/2017 0000    CL 104 02/15/2014 0733    CO2 25 01/18/2017 0000    BUN 12 01/18/2017 0000    CREAT 0.65 01/18/2017 0000    CREAT 0.78 02/15/2014 0733    GLU 47 (L) 01/18/2017 0000    GLU 173 (H) 09/04/2015 0319        Component Value Date/Time    CA 9.0 01/18/2017 0000    ALKPHOS 105 01/18/2017 0000    AST 19 01/18/2017 0000    ALT 18 01/18/2017 0000    BILITOTAL 0.3 01/18/2017 0000          Lab Results   Component Value Date    CHOL 131 01/18/2017    CHOL 136 02/22/2016    CHOL 119 09/03/2015     Lab Results   Component Value Date    HDL 65 01/18/2017    HDL 52 02/22/2016    HDL 43 09/03/2015     Lab Results   Component Value Date    LDL 54 01/18/2017    LDL 65 02/22/2016    LDL 48 09/03/2015     Lab Results   Component Value Date    TRIG 61 01/18/2017    TRIG 95 02/22/2016    TRIG 140 09/03/2015     Lab Results   Component Value Date    TSH 1.340 12/18/2014    TSH 1.17 06/28/2013     No results found for: VITD, 25HYDROXYDTO     Assessment:     1. Essential hypertension  - Suboptimally controlled, with blood pressure goal of under 130/80 mmHg given comorbidities.  - Given lower extremity edema, we will add therapy with hydrochlorothiazide 12.5 mg daily and continue current amlodipine and lisinopril.  lisinopril (PRINIVIL,ZESTRIL) 40 MG tablet; Take 1 tablet (40 mg total) by mouth daily  Dispense: 90 tablet; Refill: 1  - hydroCHLOROthiazide (HYDRODIURIL) 12.5 MG tablet; Take 1 tablet (12.5 mg total) by mouth daily  Dispense: 30 tablet; Refill: 3    2. Mixed hyperlipidemia  - Well-controlled on last lipid panel that was last done  - Continue current statin medication  - Encourage heart healthy diet such as the mediterranean diet together with aerobic exercise for 30-45 minutes 4-5 days a week as tolerated  - atorvastatin (LIPITOR) 20 MG tablet; Take 1 tablet (20  mg total) by mouth daily  Dispense: 90 tablet; Refill: 1    3. Diabetes mellitus type 2 in obese  - Fairly well-controlled on last A1c.  - Shin to continue to follow-up with her endocrinologist.  - We will continue current metformin and insulin for now.  - metFORMIN (GLUCOPHAGE) 1000 MG tablet; Take 1 tablet (1,000 mg total) by mouth 2 (two) times daily with meals  Dispense: 180 tablet; Refill: 0  - Insulin Syringe-Needle U-100 (B-D INS SYR ULTRAFINE 1CC/31G) 31G X 5/16" 1 ML Misc; Administer insulin subcutaneously 2 times a day  Dispense: 100 each; Refill: 1  - insulin NPH (HUMULIN,NOVOLIN) 100 UNIT/ML injection; Administer 50 units subcutaneously with breakfast and 24 units with dinner  Dispense: 80 mL; Refill: 0        Plan:     Plan is as above.     Advised patient to discuss symptoms of lower extremity edema and  shortness of breath during her upcoming appointment with her cardiologist.    Encouraged her to monitor her blood pressures at home and take the blood pressure log and machine to her appointment with her cardiologist.    Return in about 3 months (around 08/14/2017) for annual exam.    Standley Dakins, MD

## 2017-05-20 ENCOUNTER — Other Ambulatory Visit (INDEPENDENT_AMBULATORY_CARE_PROVIDER_SITE_OTHER): Payer: Self-pay | Admitting: Internal Medicine

## 2017-05-20 DIAGNOSIS — E669 Obesity, unspecified: Secondary | ICD-10-CM

## 2017-05-20 DIAGNOSIS — E1169 Type 2 diabetes mellitus with other specified complication: Secondary | ICD-10-CM

## 2017-06-03 LAB — COMPREHENSIVE METABOLIC PANEL
ALT: 18 IU/L (ref 0–32)
AST (SGOT): 15 IU/L (ref 0–40)
Albumin/Globulin Ratio: 1.3 (ref 1.2–2.2)
Albumin: 4.2 g/dL (ref 3.6–4.8)
Alkaline Phosphatase: 122 IU/L — ABNORMAL HIGH (ref 39–117)
BUN / Creatinine Ratio: 20 (ref 12–28)
BUN: 16 mg/dL (ref 8–27)
Bilirubin, Total: 0.4 mg/dL (ref 0.0–1.2)
CO2: 26 mmol/L (ref 20–29)
Calcium: 9.6 mg/dL (ref 8.7–10.3)
Chloride: 100 mmol/L (ref 96–106)
Creatinine: 0.8 mg/dL (ref 0.57–1.00)
EGFR: 80 mL/min/{1.73_m2} (ref >59)
EGFR: 92 mL/min/{1.73_m2} (ref >59)
Globulin, Total: 3.3 g/dL (ref 1.5–4.5)
Glucose: 141 mg/dL — ABNORMAL HIGH (ref 65–99)
Potassium: 4.1 mmol/L (ref 3.5–5.2)
Protein, Total: 7.5 g/dL (ref 6.0–8.5)
Sodium: 141 mmol/L (ref 134–144)

## 2017-06-03 LAB — MICROALBUMIN, RANDOM URINE
Creatinine, UR: 85.4 mg/dL
Microalb/Crt. Ratio: 37.8 mg/g creat — ABNORMAL HIGH (ref 0.0–30.0)
Microalbumin, UR: 32.3 ug/mL

## 2017-06-03 LAB — HEMOGLOBIN A1C: Hemoglobin A1C: 7.1 % — ABNORMAL HIGH (ref 4.8–5.6)

## 2017-06-08 ENCOUNTER — Encounter (INDEPENDENT_AMBULATORY_CARE_PROVIDER_SITE_OTHER): Payer: Self-pay | Admitting: Internal Medicine

## 2017-06-13 ENCOUNTER — Ambulatory Visit (INDEPENDENT_AMBULATORY_CARE_PROVIDER_SITE_OTHER): Payer: HMO | Admitting: Specialist

## 2017-06-13 ENCOUNTER — Encounter (INDEPENDENT_AMBULATORY_CARE_PROVIDER_SITE_OTHER): Payer: Self-pay | Admitting: Specialist

## 2017-06-13 VITALS — BP 131/69 | HR 82 | Ht 64.5 in | Wt 190.8 lb

## 2017-06-13 DIAGNOSIS — E119 Type 2 diabetes mellitus without complications: Secondary | ICD-10-CM

## 2017-06-13 DIAGNOSIS — Z794 Long term (current) use of insulin: Secondary | ICD-10-CM

## 2017-06-13 DIAGNOSIS — E782 Mixed hyperlipidemia: Secondary | ICD-10-CM

## 2017-06-13 DIAGNOSIS — I10 Essential (primary) hypertension: Secondary | ICD-10-CM

## 2017-06-13 NOTE — Progress Notes (Signed)
Chief complaint   Type 2 diabetes - uncontrolled   Hypertension    HISTORY OF PRESENT ILLNESS:  [name] is a pleasant 62 year old who presents in our clinic for  Follow up  Visit.       She informs me she was diagnosed with type 2 diabetes in  1993.  She currently manages her type 2 diabetes by taking Humulin N   mix insulin 52 units prior to breakfast and 32 units prior to dinner.  In  addition, she is on metformin 1000 mg twice daily.     She was on 70/30 mix insulin was switched several months ago- unclear reason     In terms of glycemic control,  her most recent hemoglobin A1c is stable at 7.1% and was 7% before     In terms of glucose values, she checks infrequently and informs me that she has widely fluctuating glucose values    In terms of contributing factors  to type 2 diabetes, her family history of  type 2 diabetes, sedentary lifestyle, progression of diabetes,  indiscretions to diet.     In terms of associated microvascular complications from type 2 diabetes,  She has mild microalbuminuria .In terms of peripheral  neuropathy, she does complain of some paresthesias, numbness in her lower  extremities.  No complications of open ulcers, lesions, or amputations.     In terms of retinopathy, the patient tells me she has mild retinopathy.  I  do not have a recent retinopathy surveillance report.  She denies any eye  symptoms today.     In terms of associated conditions with her type 2 diabetes, the patient has  hypertension, hyperlipidemia, and follows with her primary care physician.   She has no known coronary artery disease.  She denies chest pain, shortness  of breath with exertion.     In terms of associated symptoms of hyperglycemia, she denies polyuria,  polyphagia, or polydipsia.     REVIEW OF SYSTEMS:  As mentioned above.  In addition, denies nausea, vomiting, abdominal pain,  hematuria, dysuria, chest pain, shortness of breath with exertion.  Denies  lower extremity pain or swelling.  Denies  polyuria, polyphagia, or  polydipsia.  Denies paresthesias, numbness in her lower extremities.   Denies hair or skin changes, vision changes,  Denies chest pain, shortness of breath with exertion.    Denies any mood changes.  Denies any taste  changes. All other  systems reviewed and were negative.     PAST MEDICAL HISTORY,FAMILY HISTORY,PERSONAL AND SOCIAL HISTORY were reviewed and updated in epic care     PHYSICAL EXAMINATION:  VITAL SIGNS: reviewed and updated in epic care  GENERAL:  Pleasant, conversant, in no acute distress.  NECK:  Supple, no lymphadenopathy.  Thyroid palpable, not enlarged.  EXTREMITIES:  No edema seen.  No tremors seen.   SKIN:  Warm and dry.  No rash seen.  PSYCHIATRIC:  Mood and affect normal.     LABORATORY DATA:    Component      Latest Ref Rng & Units 06/02/2017 06/02/2017           7:23 AM  7:23 AM   Glucose      65 - 99 mg/dL  540 (H)   BUN      8 - 27 mg/dL  16   Creatinine      9.81 - 1.00 mg/dL  1.91   EGFR      >47 WG/NFA/2.13  80  BUN/Creatinine Ratio      12 - 28  20   Sodium      134 - 144 mmol/L  141   Potassium      3.5 - 5.2 mmol/L  4.1   Chloride      96 - 106 mmol/L  100   Carbon Dioxide, Whole Blood      20 - 29 mmol/L  26   Calcium      8.7 - 10.3 mg/dL  9.6   Protein, Total      6.0 - 8.5 g/dL  7.5   Albumin      3.6 - 4.8 g/dL  4.2   Globulin, Total      1.5 - 4.5 g/dL  3.3   Albumin/Globulin Ratio      1.2 - 2.2  1.3   Bilirubin, Total      0.0 - 1.2 mg/dL  0.4   Alkaline Phosphatase      39 - 117 IU/L  122 (H)   AST Whole Blood      0 - 40 IU/L  15   ALT, Whole Blood      0 - 32 IU/L  18   Creatinine, UR      Not Estab. mg/dL 16.1    Microalbumin, UR      Not Estab. ug/mL 32.3    Microalb/Crt. Ratio      0.0 - 30.0 mg/g creat 37.8 (H)    Hemoglobin A1C      4.8 - 5.6 % 7.1 (H)      ASSESSMENT AND PLAN:  Ms. Corcoran is a pleasant 62 year old who presents in our clinic for  Follow up  visit.     1.  Type 2 diabetes.  Hemoglobin A1c is at goal     I reviewed with her  goal of A1c is less than 7% to prevent microvascular  complications of neuropathy, nephropathy and retinopathy and to reduce the  risk of cardiovascular disease.     I did not recommend any further changes to insulin today but emphasized the importance of checking glucose values      I  did recommend her to check her blood glucose values before taking insulin in the morning and then again in the evening    Continue Novolog mix insulin 52 units before breakfast and 32 units before dinner      If glucose is < 110- take 1/2 dose of insulin    If glucose is < 80- dont take insulin, please take 4 glucose tablets or drink 8 oz juice and recheck in 20 minutes     Please continue metformin as  1000 mg twice daily     In addition, I reviewed dietary recommendations, low carbohydrate meals,  regular exercise as tolerated to improve glycemic control.     For hypoglycemia, I recommended to carry glucose tablets, wear a medical  alert, to check glucose prior to taking insulin, driving and prolonged  physical activity.       Diabetes related complications:  A. Retinopathy.  Per the patient, she has right-sided retinopathy, follows  with a retina specialist tight glycemic control was emphasized.  B. Neuropathy.  She has some symptoms of peripheral neuropathy.  Discussed  diabetic foot hygiene.  C. Nephropathy.  She is currently on ACE inhibitor therapy.  Tighter  glycemic control was emphasized to improve microalbuminuria .     2.  Hypertension.   She is on lisinopril and beta blocker.  3.  Hyperlipidemia.  Goal of LDL is less 100.  She is currently on statin  therapy.  No known coronary disease.     It was a pleasure taking care of her in our clinic today.  We will follow  up in 3-4  months after labs are done

## 2017-06-13 NOTE — Patient Instructions (Addendum)
You have type 2 diabetes. Your HbA1c in clinic today is 7.1%         Goal of HbA1c is < 7% to prevent complications to eye, kidney, nerve . Also reduces the risk for cardiovascular disease.      Please check glucose before taking Insulin twice daily    Continue Humulin N  insulin 52 units before breakfast and 32 units before dinner      If glucose is < 110- take 1/2 dose of insulin    If glucose is < 80- dont take insulin, please take 4 glucose tablets or drink 8 oz juice and recheck in 20 minutes     Please continue metformin as  1000 mg twice daily        Due to lack of glucose data, I am not making any changes today    Please check glucose before breakfast and before dinner ( ie. before taking insulin)    Please bring your glucose meter and follow up in clinic in  2-3 months after labs are done     Please walk daily as tolerated upto 30 minutes daily.    Pease limit your carbohydrate in your meals.    Please attend diabetes education/diet classes at your convenience.    Please visit American Diabetes Association website for more information.    Also please wear a medical alert.    You should have eye exam once a year and foot exam once a year for diabetes related care.

## 2017-08-09 ENCOUNTER — Other Ambulatory Visit (INDEPENDENT_AMBULATORY_CARE_PROVIDER_SITE_OTHER): Payer: Self-pay | Admitting: Internal Medicine

## 2017-08-09 DIAGNOSIS — E669 Obesity, unspecified: Secondary | ICD-10-CM

## 2017-08-09 DIAGNOSIS — E1169 Type 2 diabetes mellitus with other specified complication: Secondary | ICD-10-CM

## 2017-08-14 ENCOUNTER — Encounter (INDEPENDENT_AMBULATORY_CARE_PROVIDER_SITE_OTHER): Payer: HMO | Admitting: Internal Medicine

## 2017-08-14 NOTE — Progress Notes (Deleted)
Subjective:      Patient ID: [name] is a 62 y.o. female.    Chief Complaint:  No chief complaint on file.      HPI  Patient is a 63 year old Hispanic female with history of uncontrolled diabetes mellitus 2, hypertension, hyperlipidemia, afib followed by cardiologist Dr. Fransisco Beau, among others, who is here today for annual exam.     Has not had any ED visits or hospital admissions over the last year.    Diet: {mccdiet:43370}    Last eye exam: {month:41641}{year 1610:96045}    Has dental exam every 6 months? {YES/NO:21004}      Wt Readings from Last 3 Encounters:   06/13/17 86.5 kg (190 lb 12.8 oz)   05/15/17 87.5 kg (192 lb 12.8 oz)   03/15/17 87.5 kg (193 lb)     DM2 - Recent A1C much improved at 7% .  Last check in December 2018. Now followed by endocrinologist Dr. Benjiman Core.  He reports compliance with insulin and metformin as below.  She reports she has been checking her glucose at home but does not bring in the readings.  She was seen by her retina specialist over the last few months due to history of retinopathy.  She is requesting prescriptions for insulin and metformin while she is seen by her endocrinologist.    Hypertension - Reports compliance w/ Norvasc 10mg  daily and lisinopril 40mg  daily. Reports checking BP at home, readings in 140s systolic and 60s to 70s diastolic.  She reports she has been having increased lower extremity edema over the last few months and has also been having some increased shortness of breath on exertion.  Patient denies any frequent headaches, chest pain, palpitations, orthopnea or PND.     Hyperlipidemia - Reports compliance w/ Lipitor 20mg  daily.  Last lipid panel done in December 2018 showed good control.  Denies any myalgias or abdominal pain.     Health maintenance:  Breast cancer screening - last mammogram was done 07/22/2013  Cervical cancer screening - last pap smear was done 2013, normal per patient  Colon cancer screening - declines colonoscopy  Pneumovax - thinks  she had 3 years ago  Tdap - Tdap 07/29/2013  Influenza vaccine - wants today      Immunization History   Administered Date(s) Administered   . Influenza (Im) Preserved TRIVALENT VACCINE 01/23/2012   . Influenza quadrivalent (IM) 3 Yrs & greater 12/28/2012, 12/18/2014   . Influenza quadrivalent (IM) PF 3 Yrs & greater 10/31/2013   . Tdap 07/29/2013       Problem List:  Patient Active Problem List   Diagnosis   . Glaucoma   . Diabetes mellitus type 2 in obese   . Hypertension   . Hyperlipidemia   . Obesity   . Elevated liver enzymes   . Fatty liver   . Diabetic retinopathy   . Non-rheumatic mitral regurgitation   . Venous insufficiency   . Cystocele   . Hyperkalemia       Current Medications:  Current Outpatient Prescriptions   Medication Sig Dispense Refill   . amLODIPine (NORVASC) 10 MG tablet Take 5 mg by mouth daily         . apixaban (ELIQUIS) 5 MG Take 5 mg by mouth every 12 (twelve) hours.     Marland Kitchen atorvastatin (LIPITOR) 20 MG tablet Take 1 tablet (20 mg total) by mouth daily 90 tablet 1   . Blood Glucose Monitoring Suppl (ONETOUCH ULTRA 2) w/Device Kit Check glucose  daily.  ICD-10: E11.9 1 each 0   . Calcium Carbonate-Vitamin D (CALCIUM-D PO) Take 1 tablet by mouth daily.     . fluticasone (FLONASE) 50 MCG/ACT nasal spray 1 spray by Nasal route daily.2 sprays each nostril daily 1 Bottle 1   . insulin NPH (HUMULIN,NOVOLIN) 100 UNIT/ML injection Administer 50 units subcutaneously with breakfast and 24 units with dinner 80 mL 0   . Insulin Syringe-Needle U-100 (B-D INS SYR ULTRAFINE 1CC/31G) 31G X 5/16" 1 ML Misc Administer insulin subcutaneously 2 times a day 100 each 1   . lisinopril (PRINIVIL,ZESTRIL) 40 MG tablet Take 1 tablet (40 mg total) by mouth daily 90 tablet 1   . metFORMIN (GLUCOPHAGE) 1000 MG tablet Take 1 tablet (1,000 mg total) by mouth 2 (two) times daily with meals 180 tablet 0   . ONE TOUCH ULTRA TEST test strip Check glucose once a day.  ICD-10: E11.9 200 each 1   . TRAVATAN Z 0.004 % Solution  ophthalmic solution        No current facility-administered medications for this visit.        Allergies:  Allergies   Allergen Reactions   . Shellfish-Derived Products        Past Medical History:  Past Medical History:   Diagnosis Date   . Diabetes mellitus type 2 in obese    . Diabetic retinopathy 06/28/2013    Of the right eye, per patient   . Glaucoma 06/01/2012   . Hyperlipidemia    . Hypertension    . Obesity    . Venous insufficiency 09/17/2014    Followed at [facility].  Wears compression stockings.        Past Surgical History:  Past Surgical History:   Procedure Laterality Date   . BREAST CYST EXCISION  1993    bilateral   . TUBAL LIGATION         Family History:  Family History   Problem Relation Age of Onset   . Heart disease Mother    . Diabetes Mother    . Hypertension Mother    . Hyperlipidemia Mother    . Heart disease Father    . Diabetes Father    . Hypertension Father    . Hyperlipidemia Father    . Cirrhosis Father    . Liver cancer Maternal Aunt    . Cancer Maternal Uncle         liver   . Diabetes Sister        Social History:  Social History     Social History   . Marital status: Single     Spouse name: N/A   . Number of children: 3   . 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 Smoking status: Never Smoker   . Smokeless tobacco: Never Used   . Alcohol use No   . Drug use: No   . Sexual activity: Not Currently     Other Topics Concern   . Not on file     Social History Narrative    Originally from [place].  Lives with her daughter.    Exercise: walks frequently       The following sections were reviewed this encounter by the provider:        ROS:  Review of Systems   Constitutional: Negative for chills, fatigue, fever and unexpected weight change.  HENT: Negative for hearing loss.    Eyes: Negative for visual disturbance.   Respiratory: Negative for cough, chest tightness and shortness of breath.     Cardiovascular: Negative for chest pain, palpitations and leg swelling.   Gastrointestinal: Negative for abdominal pain, blood in stool, constipation, diarrhea, nausea and vomiting.   Genitourinary: Negative for difficulty urinating, dysuria, frequency, hematuria, pelvic pain and abnormal vaginal discharge.   Musculoskeletal: Negative for arthralgias and myalgias.   Skin: Negative for rash.   Neurological: Negative for dizziness, weakness, light-headedness and headaches.   Hematological: Does not bruise/bleed easily.   Psychiatric/Behavioral: Negative for dysphoric mood. The patient is not nervous/anxious.           Objective:   Vitals:  There were no vitals taken for this visit.    Physical Exam:  Physical Exam   Constitutional: She is oriented to person, place, and time. She appears well-developed. No distress.   HENT:   Head: Normocephalic.   Right Ear: No tenderness. Tympanic membrane is not erythematous and not bulging.   Left Ear: No tenderness. Tympanic membrane is not erythematous and not bulging.   Mouth/Throat: Oropharynx is clear and moist. No oropharyngeal exudate.   Eyes: Pupils are equal, round, and reactive to light. Conjunctivae are normal. Right eye exhibits no discharge. Left eye exhibits no discharge. No scleral icterus.   Neck: Neck supple. No thyromegaly present.   Cardiovascular: Normal rate, regular rhythm, normal heart sounds and intact distal pulses.  Exam reveals no gallop and no friction rub.    No murmur heard.  Pulmonary/Chest: Effort normal and breath sounds normal. No respiratory distress. She has no wheezes. She has no rales.   Abdominal: Soft. Bowel sounds are normal. She exhibits no mass. There is no tenderness. There is no rebound and no guarding.   Genitourinary:   Genitourinary Comments: GU exam deferred to patient's gynecologist  Musculoskeletal: Normal range of motion. She exhibits no edema.   Lymphadenopathy:     He has no cervical adenopathy.   Neurological: She is alert  and oriented to person, place, and time. She has normal strength. No cranial nerve deficit. Gait normal.   Skin: Skin is warm and dry. No rash noted. She is not diaphoretic.   Psychiatric: She has a normal mood and affect. Her behavior is normal.     Lab Results   Component Value Date    HGBA1C 7.1 (H) 06/02/2017     Lab Results   Component Value Date    WBC 8.3 05/23/2016    HGB 14.5 05/23/2016    HCT 43.2 05/23/2016    MCV 89 05/23/2016    PLT 246 05/23/2016     '  Chemistry        Component Value Date/Time    NA 141 06/02/2017 0723    K 4.1 06/02/2017 0723    CL 100 06/02/2017 0723    CL 104 02/15/2014 0733    CO2 26 06/02/2017 0723    BUN 16 06/02/2017 0723    CREAT 0.80 06/02/2017 0723    CREAT 0.78 02/15/2014 0733    GLU 141 (H) 06/02/2017 0723    GLU 173 (H) 09/04/2015 0319        Component Value Date/Time    CA 9.6 06/02/2017 0723    ALKPHOS 122 (H) 06/02/2017 0723    AST 15 06/02/2017 0723    ALT 18 06/02/2017 0723    BILITOTAL 0.4 06/02/2017 1610  Lab Results   Component Value Date    CHOL 131 01/18/2017    CHOL 136 02/22/2016    CHOL 119 09/03/2015     Lab Results   Component Value Date    HDL 65 01/18/2017    HDL 52 02/22/2016    HDL 43 09/03/2015     Lab Results   Component Value Date    LDL 54 01/18/2017    LDL 65 02/22/2016    LDL 48 09/03/2015     Lab Results   Component Value Date    TRIG 61 01/18/2017    TRIG 95 02/22/2016    TRIG 140 09/03/2015     Lab Results   Component Value Date    TSH 1.340 12/18/2014    TSH 1.17 06/28/2013     No results found for: VITD, 25HYDROXYDTO  No results found for: B12       Assessment:     1. Annual physical exam      Plan:     I provided following Counseling/Anticipatory Guidance:    Importance of proper nutrition and maintaining healthy weight.    Increase vegetables, fruits, lentils, beans and fiber in your diet.    BMI goal; <30. Blood Pressure goal: <130/89mmHg     Importance of aerobic/strengthening Exercises and prevention of injuries.     Exercise at least 30 minutes 5 days per week.   Always wear your seatbelt in the car.   Wear sunscreen that is broad-spectrum (UVA/UVB) and at least SPF 30.    Importance of avoiding misuse of Tobacco, Alcohol, and mood altering drugs.    Avoid drinking reduced to less than 2-3 per week    Importance of proper dental and mental health.   Schedule dental exam and cleaning every 6 months   Have your vision checked every 1-2 years.    Importance of proper Immunization as recommended by ACIP.     Get a flu vaccine yearly in the fall.   Td or Tdap vaccine every 10 years.      If age> 65   Consider Prevnar, Pneumovaxand Zostavax     Importance of proper Screening tests as recommended by USPSTF.   Colonoscopy test: at least every 10 years starting at age 81, sooner for certain at-risk groups.     Patient will continue follow-up with gynecology for routine breast and pelvic exams together with pertinent screenings.      HIgh BMI Follow Up   {MUPLANHIGHBMIFU:21026391:p}  Suggest : Annual preventative visit. This is different and separate from any problem-related visit.       ***    No Follow-up on file.    Standley Dakins, MD

## 2017-08-16 ENCOUNTER — Other Ambulatory Visit (INDEPENDENT_AMBULATORY_CARE_PROVIDER_SITE_OTHER): Payer: Self-pay | Admitting: Internal Medicine

## 2017-08-16 DIAGNOSIS — E669 Obesity, unspecified: Secondary | ICD-10-CM

## 2017-08-16 NOTE — Telephone Encounter (Signed)
Patient now followed by Dr. Benjiman Core for management of diabetes. Refill request for metformin is being forwarded to her.

## 2017-08-17 ENCOUNTER — Other Ambulatory Visit (INDEPENDENT_AMBULATORY_CARE_PROVIDER_SITE_OTHER): Payer: Self-pay | Admitting: Specialist

## 2017-08-17 DIAGNOSIS — E669 Obesity, unspecified: Secondary | ICD-10-CM

## 2017-08-17 DIAGNOSIS — E1169 Type 2 diabetes mellitus with other specified complication: Secondary | ICD-10-CM

## 2017-08-17 MED ORDER — METFORMIN HCL 1000 MG PO TABS
1000.0000 mg | ORAL_TABLET | Freq: Two times a day (BID) | ORAL | 1 refills | Status: DC
Start: 2017-08-17 — End: 2017-09-07

## 2017-09-07 ENCOUNTER — Encounter (INDEPENDENT_AMBULATORY_CARE_PROVIDER_SITE_OTHER): Payer: Self-pay | Admitting: Internal Medicine

## 2017-09-07 ENCOUNTER — Ambulatory Visit (INDEPENDENT_AMBULATORY_CARE_PROVIDER_SITE_OTHER): Payer: HMO | Admitting: Internal Medicine

## 2017-09-07 VITALS — BP 129/76 | HR 72 | Temp 98.5°F | Ht 65.75 in | Wt 191.4 lb

## 2017-09-07 DIAGNOSIS — E782 Mixed hyperlipidemia: Secondary | ICD-10-CM

## 2017-09-07 DIAGNOSIS — I10 Essential (primary) hypertension: Secondary | ICD-10-CM

## 2017-09-07 DIAGNOSIS — R5383 Other fatigue: Secondary | ICD-10-CM

## 2017-09-07 DIAGNOSIS — E1169 Type 2 diabetes mellitus with other specified complication: Secondary | ICD-10-CM

## 2017-09-07 DIAGNOSIS — E669 Obesity, unspecified: Secondary | ICD-10-CM

## 2017-09-07 MED ORDER — METFORMIN HCL 1000 MG PO TABS
1000.0000 mg | ORAL_TABLET | Freq: Two times a day (BID) | ORAL | 1 refills | Status: DC
Start: 2017-09-07 — End: 2017-11-22

## 2017-09-07 MED ORDER — INSULIN NPH (HUMAN) (ISOPHANE) 100 UNIT/ML SC SUSP
SUBCUTANEOUS | 0 refills | Status: DC
Start: 2017-09-07 — End: 2018-02-03

## 2017-09-07 MED ORDER — ATORVASTATIN CALCIUM 20 MG PO TABS
20.0000 mg | ORAL_TABLET | Freq: Every day | ORAL | 1 refills | Status: DC
Start: 2017-09-07 — End: 2017-12-25

## 2017-09-07 MED ORDER — LISINOPRIL 40 MG PO TABS
40.0000 mg | ORAL_TABLET | Freq: Every day | ORAL | 1 refills | Status: DC
Start: 2017-09-07 — End: 2017-12-25

## 2017-09-07 NOTE — Progress Notes (Signed)
Subjective:      Patient ID: [name] is a 62 y.o. female     Chief Complaint   Patient presents with   . Diabetes     f/u   . Hypertension     f/u   . Hyperlipidemia     f/u        HPI   Patient is a 62 year old Hispanic female with history of diabetes mellitus 2 now followed by endocrinologist Dr. Benjiman Core, hypertension, hyperlipidemia, afib followed by cardiologist Dr. Fransisco Beau, among others, who is here today for the above.     Since her last visit with me in 05/2017 weight has remained stable.  Continues to walk regularly.     DM2 - Last A1C 05/2017 was 7.1%.  Last check in December 2018. Now followed by endocrinologist Dr. Benjiman Core.  He reports compliance with insulin and metformin as below.  She reports she has been checking her glucose at home but does not bring in the readings, states fasting readings in 80's but postprandial readings can be in the 200s.  She was given Humulin N instead of Novolin N (which she was previously using) and feels her glucose has been higher since then.  Denies any polydipsia or polyuria.    Hypertension - Reports compliance w/ Norvasc 10mg  daily and lisinopril 40mg  daily. has not been checking BP at home regularly.  Patient denies any frequent headaches, chest pain, shortness of breath, orthopnea or PND.  Admits occasional palpitations.     Hyperlipidemia - Reports compliance w/ Lipitor 20mg  daily.  Last lipid panel done in 01/2017 showed good control.  Denies any myalgias or abdominal pain.     Has been under stress as she has been caring for her 86 year old mother. Has been feeling fatigued.     The following sections were reviewed this encounter by the provider:   Allergies  Meds  Problems         Past Medical History:   Diagnosis Date   . Diabetes mellitus type 2 in obese    . Diabetic retinopathy 06/28/2013    Of the right eye, per patient   . Glaucoma 06/01/2012   . Hyperlipidemia    . Hypertension    . Obesity    . Venous insufficiency 09/17/2014    Followed at [facility].  Wears compression stockings.        Social History     Social History   . Marital status: Single     Spouse name: N/A   . Number of children: 3   . 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 Smoking status: Never Smoker   . Smokeless tobacco: Never Used   . Alcohol use No   . Drug use: No   . Sexual activity: Not Currently     Other Topics Concern   . Not on file     Social History Narrative    Originally from [place].  Lives with her daughter.    Exercise: walks frequently       Allergies   Allergen Reactions   . Shellfish-Derived Products        Current Outpatient Prescriptions   Medication Sig Dispense Refill   . amLODIPine (NORVASC) 10 MG tablet Take 5 mg by mouth daily         . apixaban (ELIQUIS) 5 MG Take 5 mg by  mouth every 12 (twelve) hours.     Marland Kitchen atorvastatin (LIPITOR) 20 MG tablet Take 1 tablet (20 mg total) by mouth daily 90 tablet 1   . Blood Glucose Monitoring Suppl (ONETOUCH ULTRA 2) w/Device Kit Check glucose daily.  ICD-10: E11.9 1 each 0   . Calcium Carbonate-Vitamin D (CALCIUM-D PO) Take 1 tablet by mouth daily.     . insulin NPH (HUMULIN,NOVOLIN) 100 UNIT/ML injection Administer 50 units subcutaneously with breakfast and 24 units with dinner 80 mL 0   . Insulin Syringe-Needle U-100 (B-D INS SYR ULTRAFINE 1CC/31G) 31G X 5/16" 1 ML Misc Administer insulin subcutaneously 2 times a day 100 each 1   . lisinopril (PRINIVIL,ZESTRIL) 40 MG tablet Take 1 tablet (40 mg total) by mouth daily 90 tablet 1   . metFORMIN (GLUCOPHAGE) 1000 MG tablet Take 1 tablet (1,000 mg total) by mouth 2 (two) times daily with meals 180 tablet 1   . ONE TOUCH ULTRA TEST test strip Check glucose once a day.  ICD-10: E11.9 200 each 1   . TRAVATAN Z 0.004 % Solution ophthalmic solution      . fluticasone (FLONASE) 50 MCG/ACT nasal spray 1 spray by Nasal route daily.2 sprays each nostril daily 1 Bottle 1     No current facility-administered  medications for this visit.        Review of Systems   Constitutional: Positive for fatigue.   Respiratory: Negative for chest tightness and shortness of breath.    Cardiovascular: Positive for palpitations (occasionally). Negative for chest pain.   Gastrointestinal: Negative for abdominal pain, constipation, diarrhea, nausea and vomiting.   Endocrine: Negative for polydipsia and polyuria.   Musculoskeletal: Negative for myalgias.   Neurological: Positive for headaches (occasionally). Negative for dizziness and light-headedness.         Objective:   BP 129/76 (BP Site: Right arm, Patient Position: Sitting, Cuff Size: Large)   Pulse 72   Temp 98.5 F (36.9 C) (Oral)   Ht 1.67 m (5' 5.75")   Wt 86.8 kg (191 lb 6.4 oz)   BMI 31.13 kg/m     Physical Exam   Constitutional: She is oriented to person, place, and time. She appears well-developed. No distress.   HENT:   Moist mucous membranes   Eyes: Conjunctivae are normal. No scleral icterus.   Cardiovascular: Normal rate.  Exam reveals no gallop and no friction rub.    No murmur heard.  Irregularly irregular   Pulmonary/Chest: Effort normal. No respiratory distress. She has no wheezes. She has no rales.   Abdominal: Soft. She exhibits no mass. There is no tenderness.   Musculoskeletal: She exhibits no edema.   Neurological: She is alert and oriented to person, place, and time.   Skin: Skin is warm and dry. She is not diaphoretic.   Psychiatric: Her behavior is normal.   Vitals reviewed.      Lab Results   Component Value Date    HGBA1C 7.1 (H) 06/02/2017     Lab Results   Component Value Date    WBC 8.3 05/23/2016    HGB 14.5 05/23/2016    HCT 43.2 05/23/2016    MCV 89 05/23/2016    PLT 246 05/23/2016     '  Chemistry        Component Value Date/Time    NA 141 06/02/2017 0723    K 4.1 06/02/2017 0723    CL 100 06/02/2017 0723    CL 104 02/15/2014 1610  CO2 26 06/02/2017 0723    BUN 16 06/02/2017 0723    CREAT 0.80 06/02/2017 0723    CREAT 0.78 02/15/2014 0733     GLU 141 (H) 06/02/2017 0723    GLU 173 (H) 09/04/2015 0319        Component Value Date/Time    CA 9.6 06/02/2017 0723    ALKPHOS 122 (H) 06/02/2017 0723    AST 15 06/02/2017 0723    ALT 18 06/02/2017 0723    BILITOTAL 0.4 06/02/2017 0723          Lab Results   Component Value Date    CHOL 131 01/18/2017    CHOL 136 02/22/2016    CHOL 119 09/03/2015     Lab Results   Component Value Date    HDL 65 01/18/2017    HDL 52 02/22/2016    HDL 43 09/03/2015     Lab Results   Component Value Date    LDL 54 01/18/2017    LDL 65 02/22/2016    LDL 48 09/03/2015     Lab Results   Component Value Date    TRIG 61 01/18/2017    TRIG 95 02/22/2016    TRIG 140 09/03/2015     Lab Results   Component Value Date    TSH 1.340 12/18/2014    TSH 1.17 06/28/2013     No results found for: VITD, 25HYDROXYDTO     Assessment:     1. Diabetes mellitus type 2 in obese  - Fairly well-controlled on last A1C which was at goal of under 7%  - Recheck A1C to reevaluate  - Encourage efforts to monitor carbohydrate intake and exercise regularly as tolerated  - For now continue current medications and adjust as needed; Rx for NPH insulin written requesting Novolin N  - Patient has f/u appointment with endocrinologist Dr. Benjiman Core next week  - insulin NPH (HUMULIN,NOVOLIN) 100 UNIT/ML injection; Administer 50 units subcutaneously with breakfast and 24 units with dinner  Dispense: 80 mL; Refill: 0  - metFORMIN (GLUCOPHAGE) 1000 MG tablet; Take 1 tablet (1,000 mg total) by mouth 2 (two) times daily with meals  Dispense: 180 tablet; Refill: 1  - CBC and differential; Future  - Hemoglobin A1C; Future    2. Essential hypertension  - Well-controlled with blood pressure at goal of under 130/88mmHg  - Continue current medications  - Encourage low sodium diet and routine exercise as tolerated  - lisinopril (PRINIVIL,ZESTRIL) 40 MG tablet; Take 1 tablet (40 mg total) by mouth daily  Dispense: 90 tablet; Refill: 1  - CBC and differential; Future    3. Mixed  hyperlipidemia  - Well-controlled on last lipid panel that was last done  - Continue current statin medication  - Encourage heart healthy diet such as the mediterranean diet together with aerobic exercise for 30-45 minutes 4-5 days a week as tolerated  - atorvastatin (LIPITOR) 20 MG tablet; Take 1 tablet (20 mg total) by mouth daily  Dispense: 90 tablet; Refill: 1  - CBC and differential; Future  - Comprehensive metabolic panel; Future  - Lipid panel; Future    4. Other fatigue  - Likely secondary to stress, however will check labs below to further evaluate.   - CBC and differential; Future  - Comprehensive metabolic panel; Future  - TSH; Future        Plan:     Plan is as above.     Return in about 3 months (around 12/08/2017) for annual exam.  Ian Malkin, MD

## 2017-09-07 NOTE — Progress Notes (Signed)
Have you seen any specialists/other providers since your last visit with us?    No    Arm preference verified?   Yes    The patient is due for foot exam, pap smear, shingles vaccine and [facility] care plan letter

## 2017-09-11 ENCOUNTER — Other Ambulatory Visit: Payer: HMO

## 2017-09-12 LAB — CBC AND DIFFERENTIAL
Baso(Absolute): 0 10*3/uL (ref 0.0–0.2)
Basos: 0 %
Eos: 3 %
Eosinophils Absolute: 0.2 10*3/uL (ref 0.0–0.4)
Hematocrit: 38.9 % (ref 34.0–46.6)
Hemoglobin: 12.9 g/dL (ref 11.1–15.9)
Immature Granulocytes Absolute: 0 10*3/uL (ref 0.0–0.1)
Immature Granulocytes: 0 %
Lymphocytes Absolute: 2.2 10*3/uL (ref 0.7–3.1)
Lymphocytes: 25 %
MCH: 29.9 pg (ref 26.6–33.0)
MCHC: 33.2 g/dL (ref 31.5–35.7)
MCV: 90 fL (ref 79–97)
Monocytes Absolute: 0.8 10*3/uL (ref 0.1–0.9)
Monocytes: 9 %
Neutrophils Absolute: 5.5 10*3/uL (ref 1.4–7.0)
Neutrophils: 63 %
Platelets: 231 10*3/uL (ref 150–450)
RBC: 4.31 x10E6/uL (ref 3.77–5.28)
RDW: 15.1 % (ref 12.3–15.4)
WBC: 8.8 10*3/uL (ref 3.4–10.8)

## 2017-09-12 LAB — COMPREHENSIVE METABOLIC PANEL
ALT: 18 IU/L (ref 0–32)
AST (SGOT): 18 IU/L (ref 0–40)
Albumin/Globulin Ratio: 1.3 (ref 1.2–2.2)
Albumin: 4 g/dL (ref 3.6–4.8)
Alkaline Phosphatase: 102 IU/L (ref 39–117)
BUN / Creatinine Ratio: 24 (ref 12–28)
BUN: 16 mg/dL (ref 8–27)
Bilirubin, Total: 0.3 mg/dL (ref 0.0–1.2)
CO2: 25 mmol/L (ref 20–29)
Calcium: 9.2 mg/dL (ref 8.7–10.3)
Chloride: 107 mmol/L — ABNORMAL HIGH (ref 96–106)
Creatinine: 0.66 mg/dL (ref 0.57–1.00)
EGFR: 109 mL/min/{1.73_m2} (ref >59)
EGFR: 95 mL/min/{1.73_m2} (ref >59)
Globulin, Total: 3.2 g/dL (ref 1.5–4.5)
Glucose: 65 mg/dL (ref 65–99)
Potassium: 4.1 mmol/L (ref 3.5–5.2)
Protein, Total: 7.2 g/dL (ref 6.0–8.5)
Sodium: 148 mmol/L — ABNORMAL HIGH (ref 134–144)

## 2017-09-12 LAB — LIPID PANEL
Cholesterol / HDL Ratio: 2.4 ratio (ref 0.0–4.4)
Cholesterol: 131 mg/dL (ref 100–199)
HDL: 55 mg/dL (ref >39)
LDL Calculated: 57 mg/dL (ref 0–99)
Triglycerides: 95 mg/dL (ref 0–149)
VLDL Calculated: 19 mg/dL (ref 5–40)

## 2017-09-12 LAB — TSH: TSH: 1.4 u[IU]/mL (ref 0.450–4.500)

## 2017-09-12 LAB — HEMOGLOBIN A1C: Hemoglobin A1C: 8.2 % — ABNORMAL HIGH (ref 4.8–5.6)

## 2017-09-13 ENCOUNTER — Encounter (INDEPENDENT_AMBULATORY_CARE_PROVIDER_SITE_OTHER): Payer: Self-pay | Admitting: Specialist

## 2017-09-13 ENCOUNTER — Ambulatory Visit (INDEPENDENT_AMBULATORY_CARE_PROVIDER_SITE_OTHER): Payer: HMO | Admitting: Specialist

## 2017-09-13 ENCOUNTER — Other Ambulatory Visit (INDEPENDENT_AMBULATORY_CARE_PROVIDER_SITE_OTHER): Payer: Self-pay | Admitting: Internal Medicine

## 2017-09-13 VITALS — BP 145/67 | HR 94 | Ht 63.5 in | Wt 189.8 lb

## 2017-09-13 DIAGNOSIS — Z794 Long term (current) use of insulin: Secondary | ICD-10-CM

## 2017-09-13 DIAGNOSIS — I10 Essential (primary) hypertension: Secondary | ICD-10-CM

## 2017-09-13 DIAGNOSIS — E119 Type 2 diabetes mellitus without complications: Secondary | ICD-10-CM

## 2017-09-13 NOTE — Patient Instructions (Addendum)
You have type 2 diabetes. Your HbA1c in clinic today is 7.1%         Goal of HbA1c is < 7% to prevent complications to eye, kidney, nerve . Also reduces the risk for cardiovascular disease.      Please check glucose before taking Insulin twice daily    Continue Humulin N  insulin 52 units before breakfast and increase to 26  units before dinner      If glucose is < 110- take 1/2 dose of insulin    If glucose is < 80- dont take insulin, please take 4 glucose tablets or drink 8 oz juice and recheck in 20 minutes     Please continue metformin as  1000 mg twice daily    Please check glucose before breakfast and before dinner ( ie. before taking insulin)    Please bring your glucose meter and follow up in clinic in  3 months after labs are done     Please walk daily as tolerated upto 30 minutes daily.    Pease limit your carbohydrate in your meals.    Please attend diabetes education/diet classes at your convenience.    Please visit American Diabetes Association website for more information.    Also please wear a medical alert.    You should have eye exam once a year and foot exam once a year for diabetes related care.

## 2017-09-13 NOTE — Progress Notes (Signed)
Chief complaint   Type 2 diabetes - uncontrolled   Hypertension    HISTORY OF PRESENT ILLNESS:  [name] is a pleasant 62 year old who presents in our clinic for  Follow up  Visit.       She informs me she was diagnosed with type 2 diabetes in  1993.  She currently manages her type 2 diabetes by taking Humulin N   mix insulin 52 units prior to breakfast and 22 units prior to dinner.  In  addition, she is on metformin 1000 mg twice daily.       In terms of glycemic control, HbA1c is at 8.1% compared to 7.1% before     In terms of glucose values, she checks typically in the morning and not regularly in the evening    Glucose values have been 180-230 mg/dl.    Due to recent life stressors and lack of sleep( she has been caring for her sick mother) , glucose values have been elevated      In terms of contributing factors  to type 2 diabetes, her family history of  type 2 diabetes, sedentary lifestyle, progression of diabetes,  indiscretions to diet.     In terms of associated microvascular complications from type 2 diabetes,  She has mild microalbuminuria .In terms of peripheral  neuropathy, she does complain of some paresthesias, numbness in her lower  extremities.  No complications of open ulcers, lesions, or amputations.     In terms of retinopathy, the patient tells me she has mild retinopathy.  I  do not have a recent retinopathy surveillance report.  She denies any eye  symptoms today. Her next eye exam is due later this month     In terms of associated conditions with her type 2 diabetes, the patient has  hypertension, hyperlipidemia, and follows with her primary care physician.   She has no known coronary artery disease.  She denies chest pain, shortness  of breath with exertion.     In terms of associated symptoms of hyperglycemia, she denies polyuria,  polyphagia, or polydipsia.     REVIEW OF SYSTEMS:  As mentioned above.  In addition, denies nausea, vomiting, abdominal pain,  hematuria, dysuria, chest  pain, shortness of breath with exertion.  Denies  lower extremity pain or swelling.  Denies polyuria, polyphagia, or  polydipsia.  Denies paresthesias, numbness in her lower extremities.   Denies hair or skin changes, vision changes,  Denies chest pain, shortness of breath with exertion.    Denies any mood changes.  Denies any taste  changes. All other  systems reviewed and were negative.     PAST MEDICAL HISTORY,FAMILY HISTORY,PERSONAL AND SOCIAL HISTORY were reviewed and updated in epic care     PHYSICAL EXAMINATION:  VITAL SIGNS: reviewed and updated in epic care  GENERAL:  Pleasant, conversant, in no acute distress.  NECK:  Supple, no lymphadenopathy.  Thyroid palpable, not enlarged.  EXTREMITIES:  No edema seen.  No tremors seen.   SKIN:  Warm and dry.  No rash seen.  PSYCHIATRIC:  Mood and affect normal.     LABORATORY DATA:    Component      Latest Ref Rng & Units 09/11/2017 09/11/2017          12:00 AM 12:00 AM   Glucose      65 - 99 mg/dL  65   BUN      8 - 27 mg/dL  16   Creatinine  0.57 - 1.00 mg/dL  1.61   EGFR      >09 UE/AVW/0.98  95   BUN/Creatinine Ratio      12 - 28  24   Sodium      134 - 144 mmol/L  148 (H)   Potassium      3.5 - 5.2 mmol/L  4.1   Chloride      96 - 106 mmol/L  107 (H)   Carbon Dioxide, Whole Blood      20 - 29 mmol/L  25   Calcium      8.7 - 10.3 mg/dL  9.2   Protein, Total      6.0 - 8.5 g/dL  7.2   Albumin      3.6 - 4.8 g/dL  4.0   Globulin, Total      1.5 - 4.5 g/dL  3.2   Albumin/Globulin Ratio      1.2 - 2.2  1.3   Bilirubin, Total      0.0 - 1.2 mg/dL  0.3   Alkaline Phosphatase      39 - 117 IU/L  102   AST Whole Blood      0 - 40 IU/L  18   ALT, Whole Blood      0 - 32 IU/L  18   Cholesterol      100 - 199 mg/dL 119    Triglycerides      0 - 149 mg/dL 95    HDL      >14 mg/dL 55    VLDL Cholesterol Cal      5 - 40 mg/dL 19    Calculated LDL      0 - 99 mg/dL 57    CHOL/HDL Ratio      0.0 - 4.4 ratio 2.4    TSH      0.450 - 4.500 uIU/mL 1.400    Hemoglobin A1C      4.8 -  5.6 % 8.2 (H)            ASSESSMENT AND PLAN:  [name] is a pleasant 62 year old who presents in our clinic for  Follow up  visit.     1.  Type 2 diabetes.  Hemoglobin A1c is at goal     I reviewed with her goal of A1c is less than 7% to prevent microvascular  complications of neuropathy, nephropathy and retinopathy and to reduce the  risk of cardiovascular disease.     I did not recommend any further changes to insulin today but emphasized the importance of checking glucose values      I  did recommend her to check her blood glucose values before taking insulin in the morning and then again in the evening    Continue Novolog mix insulin 52 units before breakfast and increase to 26 units before dinner      If glucose is < 110- take 1/2 dose of insulin    If glucose is < 80- dont take insulin, please take 4 glucose tablets or drink 8 oz juice and recheck in 20 minutes     Please continue metformin as  1000 mg twice daily     In addition, I reviewed dietary recommendations, low carbohydrate meals,  regular exercise as tolerated to improve glycemic control.     For hypoglycemia, I recommended to carry glucose tablets, wear a medical  alert, to check glucose prior to taking insulin, driving and prolonged  physical activity.  Diabetes related complications:  A. Retinopathy.  Per the patient, she has right-sided retinopathy, follows  with a retina specialist tight glycemic control was emphasized.  B. Neuropathy.  She has some symptoms of peripheral neuropathy.  Discussed  diabetic foot hygiene.  C. Nephropathy.  She is currently on ACE inhibitor therapy.  Tighter  glycemic control was emphasized to improve microalbuminuria .     2.  Hypertension.   She is on lisinopril and beta blocker.       3.  Hyperlipidemia.  Goal of LDL is less 100.  She is currently on statin  therapy.  No known coronary disease.     It was a pleasure taking care of her in our clinic today.  We will follow  up in 3-4  months after labs  are done

## 2017-09-28 ENCOUNTER — Encounter (INDEPENDENT_AMBULATORY_CARE_PROVIDER_SITE_OTHER): Payer: Self-pay | Admitting: Internal Medicine

## 2017-10-26 ENCOUNTER — Emergency Department
Admission: EM | Admit: 2017-10-26 | Discharge: 2017-10-27 | Disposition: A | Discharge status: Home / Self Care | Payer: HMO | Attending: Emergency Medicine | Admitting: Emergency Medicine

## 2017-10-26 DIAGNOSIS — I482 Chronic atrial fibrillation, unspecified: Secondary | ICD-10-CM

## 2017-10-26 DIAGNOSIS — I10 Essential (primary) hypertension: Secondary | ICD-10-CM | POA: Insufficient documentation

## 2017-10-26 DIAGNOSIS — Z7901 Long term (current) use of anticoagulants: Secondary | ICD-10-CM | POA: Insufficient documentation

## 2017-10-26 DIAGNOSIS — E11319 Type 2 diabetes mellitus with unspecified diabetic retinopathy without macular edema: Secondary | ICD-10-CM | POA: Insufficient documentation

## 2017-10-26 DIAGNOSIS — R002 Palpitations: Secondary | ICD-10-CM | POA: Insufficient documentation

## 2017-10-26 DIAGNOSIS — H409 Unspecified glaucoma: Secondary | ICD-10-CM | POA: Insufficient documentation

## 2017-10-26 DIAGNOSIS — E785 Hyperlipidemia, unspecified: Secondary | ICD-10-CM | POA: Insufficient documentation

## 2017-10-26 DIAGNOSIS — Z79899 Other long term (current) drug therapy: Secondary | ICD-10-CM | POA: Insufficient documentation

## 2017-10-26 DIAGNOSIS — Z794 Long term (current) use of insulin: Secondary | ICD-10-CM | POA: Insufficient documentation

## 2017-10-26 NOTE — ED Provider Notes (Signed)
EMERGENCY DEPARTMENT HISTORY AND PHYSICAL EXAM     Physician/Midlevel provider first contact with patient: 10/26/17 2329         Date: 10/26/2017  Patient Name: [name]    History of Presenting Illness     Chief Complaint   Patient presents with   . Palpitations       History Provided By: Patient and Patient's son    Chief Complaint: palpitations   Duration: 1 hour PTA  Timing: acute, improving   Location: cardio  Quality: racing heart  Severity: moderate  Modifying Factors: none  Associated Symptoms: HA, chills  Pertinent Negatives: fever, SOB, CP, n/v/d, abdominal pain, syncope, dizziness    Additional History: [name] is a 62 y.o. female with a h/o HLD, HTN, DM, and a-fib (on Eliquis) presenting to the ED c/o acute onset of improving heart palpitations which began 1 hour PTA while laying down. She notes accompanying HA and chills. She denies fever, SOB, CP, n/v/d, abdominal pain, syncope, or dizziness. She is followed by Dr. Fransisco [facility] cardiology).       PCP: Castillo-Catoni, Maudry Mayhew, MD  SPECIALISTS:    Current Facility-Administered Medications   Medication Dose Route Frequency Provider Last Rate Last Dose   . sodium chloride 0.9 % bolus 1,000 mL  1,000 mL Intravenous Once Elliot Cousin, MD 1,000 mL/hr at 10/27/17 0042 1,000 mL at 10/27/17 0042     Current Outpatient Prescriptions   Medication Sig Dispense Refill   . amLODIPine (NORVASC) 5 MG tablet Take 5 mg by mouth daily         . apixaban (ELIQUIS) 5 MG Take 5 mg by mouth every 12 (twelve) hours.     Marland Kitchen lisinopril (PRINIVIL,ZESTRIL) 40 MG tablet Take 1 tablet (40 mg total) by mouth daily 90 tablet 1   . metFORMIN (GLUCOPHAGE) 1000 MG tablet Take 1 tablet (1,000 mg total) by mouth 2 (two) times daily with meals 180 tablet 1   . atorvastatin (LIPITOR) 20 MG tablet Take 1 tablet (20 mg total) by mouth daily 90 tablet 1   . Blood Glucose Monitoring Suppl (ONETOUCH ULTRA 2) w/Device Kit Check glucose daily.  ICD-10: E11.9 1 each 0   . Blood  Glucose Monitoring Suppl (ONETOUCH ULTRA 2) w/Device Kit [employer]     . Calcium Carbonate-Vitamin D (CALCIUM-D PO) Take 1 tablet by mouth daily.     . insulin NPH (HUMULIN,NOVOLIN) 100 UNIT/ML injection Administer 50 units subcutaneously with breakfast and 24 units with dinner 80 mL 0   . Insulin Syringe-Needle U-100 (B-D INS SYR ULTRAFINE 1CC/31G) 31G X 5/16" 1 ML Misc Administer insulin subcutaneously 2 times a day 100 each 1   . ONE TOUCH ULTRA TEST test strip Check glucose once a day.  ICD-10: E11.9 200 each 1   . TRAVATAN Z 0.004 % Solution ophthalmic solution          Past History     Past Medical History:  Past Medical History:   Diagnosis Date   . Diabetes mellitus type 2 in obese    . Diabetic retinopathy 06/28/2013    Of the right eye, per patient   . Glaucoma 06/01/2012   . Hyperlipidemia    . Hypertension    . Obesity    . Venous insufficiency 09/17/2014    Followed at [facility].  Wears compression stockings.        Past Surgical History:  Past Surgical History:  Procedure Laterality Date   . BREAST CYST EXCISION  1993    bilateral   . TUBAL LIGATION         Family History:  Family History   Problem Relation Age of Onset   . Heart disease Mother    . Diabetes Mother    . Hypertension Mother    . Hyperlipidemia Mother    . Heart disease Father    . Diabetes Father    . Hypertension Father    . Hyperlipidemia Father    . Cirrhosis Father    . Liver cancer Maternal Aunt    . Cancer Maternal Uncle         liver   . Diabetes Sister        Social History:  Social History   Substance Use Topics   . Smoking status: Never Smoker   . Smokeless tobacco: Never Used   . Alcohol use No       Allergies:  Allergies   Allergen Reactions   . Shellfish-Derived Products        Review of Systems     Review of Systems   Constitutional: Positive for chills. Negative for fever.   Respiratory: Negative for shortness of breath.    Cardiovascular: Positive for palpitations. Negative for chest pain.    Gastrointestinal: Negative for abdominal pain, diarrhea, nausea and vomiting.   Neurological: Positive for headaches. Negative for dizziness and syncope.   All other systems reviewed and are negative.      Physical Exam   BP 149/74   Pulse 92   Temp 98.7 F (37.1 C) (Oral)   Resp 18   Ht 5\' 1"  (1.549 m)   Wt 87.1 kg   SpO2 99%   BMI 36.28 kg/m     Physical Exam   Constitutional: She is oriented to person, place, and time and well-developed, well-nourished, and in no distress. No distress.   HENT:   Head: Normocephalic and atraumatic.   Eyes: Conjunctivae are normal. Right eye exhibits no discharge. Left eye exhibits no discharge. No scleral icterus.   Cardiovascular: Normal rate, normal heart sounds and intact distal pulses.    irr   Pulmonary/Chest: Effort normal and breath sounds normal. No respiratory distress. She has no wheezes. She has no rales.   Abdominal: Soft. Bowel sounds are normal. She exhibits no distension. There is no tenderness. There is no rebound and no guarding.   Neurological: She is alert and oriented to person, place, and time. Gait normal. GCS score is 15.   Skin: Skin is warm and dry. She is not diaphoretic.   Psychiatric: Mood and affect normal.       Diagnostic Study Results     Labs -     Results     Procedure Component Value Units Date/Time    Troponin I [[id number]] Collected:  10/27/17 0013    Specimen:  Blood Updated:  10/27/17 0054     Troponin I <0.01 ng/mL     Comprehensive metabolic panel [[id number]]  (Abnormal) Collected:  10/27/17 0013    Specimen:  Blood Updated:  10/27/17 0047     Glucose 364 (H) mg/dL      BUN 78.2 mg/dL      Creatinine 1.0 mg/dL      Sodium 956 mEq/L      Potassium 4.0 mEq/L      Chloride 105 mEq/L      CO2 22 mEq/L      Calcium 8.6 mg/dL  Protein, Total 6.9 g/dL      Albumin 3.6 g/dL      AST (SGOT) 22 U/L      ALT 28 U/L      Alkaline Phosphatase 138 (H) U/L      Bilirubin, Total 0.2 mg/dL      Globulin 3.3 g/dL      Albumin/Globulin Ratio  1.1     Anion Gap 10.0    Hemolysis index [[id number]] Collected:  10/27/17 0013     Updated:  10/27/17 0047     Hemolysis Index 4    GFR [[id number]] Collected:  10/27/17 0013     Updated:  10/27/17 0047     EGFR 56.2    CBC with differential [[id number]]  (Abnormal) Collected:  10/27/17 0013    Specimen:  Blood from Blood Updated:  10/27/17 0042     WBC 7.79 x10 3/uL      Hgb 11.1 (L) g/dL      Hematocrit 96.2 (L) %      Platelets 213 x10 3/uL      RBC 3.77 (L) x10 6/uL      MCV 91.8 fL      MCH 29.4 pg      MCHC 32.1 g/dL      RDW 14 %      MPV 11.4 fL      Neutrophils 64.0 %      Lymphocytes Automated 23.4 %      Monocytes 10.1 %      Eosinophils Automated 1.7 %      Basophils Automated 0.4 %      Immature Granulocyte 0.4 %      Nucleated RBC 0.0 /100 WBC      Neutrophils Absolute 4.99 x10 3/uL      Abs Lymph Automated 1.82 x10 3/uL      Abs Mono Automated 0.79 x10 3/uL      Abs Eos Automated 0.13 x10 3/uL      Absolute Baso Automated 0.03 x10 3/uL      Absolute Immature Granulocyte 0.03 x10 3/uL      Absolute NRBC 0.00 x10 3/uL     PT/APTT [[id number]]  (Abnormal) Collected:  10/27/17 0013     Updated:  10/27/17 0036     PT 12.5 (L) sec      PT INR 0.9     PTT 26 sec           Radiologic Studies -   Radiology Results (24 Hour)     ** No results found for the last 24 hours. **      .    Medical Decision Making   I am the first provider for this patient.    I reviewed the vital signs, available nursing notes, past medical history, past surgical history, family history and social history.    Vital Signs-Reviewed the patient's vital signs.     Patient Vitals for the past 12 hrs:   BP Temp Pulse Resp   10/27/17 0042 149/74 - 92 -   10/26/17 2330 164/79 98.7 F (37.1 C) (!) 104 18       Pulse Oximetry Analysis - Normal 99% on RA    Cardiac Monitor:  Rate: 101  Rhythm:  Atrial Flutter    EKG:  Interpreted by the EP.   Time Interpreted: 23:54   Rate: 101   Rhythm: Atrial Flutter   Interpretation: No STEMI    Old Medical  Records: Nursing Notes.  ED Course:     12:09 AM - Informed pt and son of intent to obtain imaging and lab work. They are agreeable to the plan.    1:19 AM - Updated pt on all results. Her HR is stable at an improved rate of 85. She is currently asymptomatic. She states she took 26 units of Humulin- N insulin after dinner tonight at 7:30 PM.    1:25 AM - Discussed f/u with cardiology, home self care, discharge instructions, and return precautions with patient. Possibility of evolving illness reviewed. All questions solicited and addressed. Patient states understanding and amenable to discharge.     Provider Notes:       Diagnosis     Clinical Impression:   1. Palpitations    2. Chronic atrial fibrillation        Treatment Plan:   ED Disposition     ED Disposition Condition Date/Time Comment    Discharge  Fri Oct 27, 2017  1:23 AM [name] discharge to home/self care.    Condition at disposition: Stable            _______________________________      Attestations: This note is prepared by Vincent Gros, acting as scribe for Tomasa Rand, MD.    Tomasa Rand, MD - The scribe's documentation has been prepared under my direction and personally reviewed by me in its entirety.  I confirm that the note above accurately reflects all work, treatment, procedures, and medical decision making performed by me.    _______________________________     Elliot Cousin, MD  10/27/17 [phone]

## 2017-10-26 NOTE — ED Notes (Signed)
Bed: GR14  Expected date:   Expected time:   Means of arrival:   Comments:  M,206

## 2017-10-27 LAB — COMPREHENSIVE METABOLIC PANEL
ALT: 28 U/L (ref 0–55)
AST (SGOT): 22 U/L (ref 5–34)
Albumin/Globulin Ratio: 1.1 (ref 0.9–2.2)
Albumin: 3.6 g/dL (ref 3.5–5.0)
Alkaline Phosphatase: 138 U/L — ABNORMAL HIGH (ref 37–106)
Anion Gap: 10 (ref 5.0–15.0)
BUN: 12 mg/dL (ref 7.0–19.0)
Bilirubin, Total: 0.2 mg/dL (ref 0.2–1.2)
CO2: 22 mEq/L (ref 22–29)
Calcium: 8.6 mg/dL (ref 8.5–10.5)
Chloride: 105 mEq/L (ref 100–111)
Creatinine: 1 mg/dL (ref 0.6–1.0)
Globulin: 3.3 g/dL (ref 2.0–3.6)
Glucose: 364 mg/dL — ABNORMAL HIGH (ref 70–100)
Potassium: 4 mEq/L (ref 3.5–5.1)
Protein, Total: 6.9 g/dL (ref 6.0–8.3)
Sodium: 137 mEq/L (ref 136–145)

## 2017-10-27 LAB — CBC AND DIFFERENTIAL
Absolute NRBC: 0 10*3/uL (ref 0.00–0.00)
Basophils Absolute Automated: 0.03 10*3/uL (ref 0.00–0.08)
Basophils Automated: 0.4 %
Eosinophils Absolute Automated: 0.13 10*3/uL (ref 0.00–0.44)
Eosinophils Automated: 1.7 %
Hematocrit: 34.6 % — ABNORMAL LOW (ref 34.7–43.7)
Hgb: 11.1 g/dL — ABNORMAL LOW (ref 11.4–14.8)
Immature Granulocytes Absolute: 0.03 10*3/uL (ref 0.00–0.07)
Immature Granulocytes: 0.4 %
Lymphocytes Absolute Automated: 1.82 10*3/uL (ref 0.42–3.22)
Lymphocytes Automated: 23.4 %
MCH: 29.4 pg (ref 25.1–33.5)
MCHC: 32.1 g/dL (ref 31.5–35.8)
MCV: 91.8 fL (ref 78.0–96.0)
MPV: 11.4 fL (ref 8.9–12.5)
Monocytes Absolute Automated: 0.79 10*3/uL (ref 0.21–0.85)
Monocytes: 10.1 %
Neutrophils Absolute: 4.99 10*3/uL (ref 1.10–6.33)
Neutrophils: 64 %
Nucleated RBC: 0 /100 WBC (ref 0.0–0.0)
Platelets: 213 10*3/uL (ref 142–346)
RBC: 3.77 10*6/uL — ABNORMAL LOW (ref 3.90–5.10)
RDW: 14 % (ref 11–15)
WBC: 7.79 10*3/uL (ref 3.10–9.50)

## 2017-10-27 LAB — PT AND APTT
PT INR: 0.9 (ref 0.9–1.1)
PT: 12.5 s — ABNORMAL LOW (ref 12.6–15.0)
PTT: 26 s (ref 23–37)

## 2017-10-27 LAB — TROPONIN I: Troponin I: <0.01 ng/mL (ref 0.00–0.09)

## 2017-10-27 LAB — GFR: EGFR: 56.2

## 2017-10-27 LAB — HEMOLYSIS INDEX: Hemolysis Index: 4 (ref 0–18)

## 2017-10-27 MED ORDER — INSULIN REGULAR HUMAN 100 UNIT/ML IJ SOLN
6.00 [IU] | Freq: Once | INTRAMUSCULAR | Status: DC
Start: 2017-10-27 — End: 2017-10-27

## 2017-10-27 MED ORDER — METOPROLOL TARTRATE 25 MG PO TABS
50.0000 mg | ORAL_TABLET | Freq: Once | ORAL | Status: AC
Start: 2017-10-27 — End: 2017-10-27
  Administered 2017-10-27: 50 mg via ORAL
  Filled 2017-10-27: qty 2

## 2017-10-27 MED ORDER — SODIUM CHLORIDE 0.9 % IV BOLUS
1000.00 mL | Freq: Once | INTRAVENOUS | Status: AC
Start: 2017-10-27 — End: 2017-10-27
  Administered 2017-10-27: 1000 mL via INTRAVENOUS

## 2017-10-27 NOTE — Discharge Instructions (Signed)
    Palpitaciones        Palpitations      1.   Se le han diagnosticado “palpitaciones”.    1.   You have been diagnosed with "palpitations."                    2.   Las palpitaciones son latidos en el pecho que se sienten de manera extraña. A menudo son causadas por exceso de latidos que se presentan más pronto de lo normal. Son “contracciones atriales prematuras” o “contracciones ventriculares prematuras”, dependiendo del lugar del corazón donde estén ocurriendo. A menudo las palpitaciones desaparecen por sí solas y no causan problemas serios. En ocasiones están relacionadas al estrés o a la falta de sueño. También pueden estar relacionadas al consumo excesivo de cafeína. Estos síntomas pueden ser causados por muchos medicamentos de venta libre (que no requieren receta), medicamentos para la gripe, píldoras de dieta y suplementos vitamínicos “naturales” con estimulantes, a menudo la efedrina (la efedrina también es conocida por su nombre tradicional chino, Ma huang).    2.   Palpitations are beats in the chest that feel funny or strange. They are often caused by extra heartbeats that come earlier than normal. These are either "premature atrial contractions" or "premature ventricular contractions," depending on where in the heart they happen. Palpitations often go away on their own and often cause no serious problems. They are sometimes related to stress or lack of sleep. They can also be related too much caffeine. These symptoms can be caused by many over-the-counter (no prescription needed) cold medicines, diet pills and "natural" vitamin supplements with stimulants, often ephedrine (Ephedrine is also known by its traditional Chinese name, Ma huang).                    3.   Las palpitaciones se sienten de distinta manera en cada persona. Algunos pacientes describen que sienten “mariposas” en el pecho. Otros dicen que sienten como si el corazón estuviese “dando vueltas" en el pecho. Las palpitaciones pueden  presentarse a menudo, pero deberían durar solamente uno o dos segundos en cada ocasión. No deberían causar dolor de pecho, mareos, vértigos ni desmayos.    3.   Palpitations feel different for different people. Some patients describe a feeling of "butterflies" in the chest. Others say it feels like the heart is "flipping over" in the chest. Palpitations may happen often but should last only a second or two each time. They should not cause any chest pain, lightheadedness, dizziness or fainting.                    4.   No existe un tratamiento específico para las palpitaciones. Sin embargo, debería evitar la cafeína y los medicamentos para la gripe. También evite todos los estimulantes naturales y el chocolate.    4.   There is no specific treatment for palpitations. However, you should avoid all caffeine and cold medications. Also avoid all natural stimulants and chocolate.                    5.   Dé seguimiento con su médico primario durante la siguiente semana para asegurarse de que los síntomas estén desapareciendo. En algunos casos, se puede solicitar un monitor Holter. Un monitor Holter es un monitor cardíaco portátil. Registra el ritmo eléctrico de su corazón. Necesitará ver a su médico regular para obtener los resultados de la prueba.    5.   Follow up with your primary doctor in the next week to make sure your symptoms are getting better. In some cases, a Holter monitor may be ordered. A Holter monitor is a portable heart monitor. It records your heart's electrical rhythm. You will need to see your regular   get the results from the test.             6.  DEBE BUSCAR ATENCIN MDICA INMEDIATAMENTE, AQU O EN LA SALA DE EMERGENCIAS MS CERCANA, SI SE PRESENTA CUALQUIERA DE LAS SIGUIENTES SITUACIONES:   6.  YOU SHOULD SEEK MEDICAL ATTENTION IMMEDIATELY, EITHER HERE OR AT THE NEAREST EMERGENCY DEPARTMENT, IF ANY OF THE FOLLOWING OCCURS:      * Si tiene mareos o la sensacin de que podra  [place].    * Lightheadedness or the feeling you might faint.      * Si presenta un ritmo cardaco inusualmente rpido o lento.    * An unusually fast or slow heart rate.      * Si tiene dolor en el pecho o falta de [place].    * Chest pain or shortness of breath.      * Si las palpitaciones se incrementan al hacer ejercicio.    * Palpitations increase when you exercise.      * Si hay otro sntoma que empeore o si tiene alguna inquietud.    * Any other worsening symptoms or concerns.                          Fibrilacin auricular, crnica     Atrial Fib, Chronic     1.  Usted ha sido atendido porque sus problemas crnicos con la fibrilacin auricular aparecieron de nuevo o empeoraron.   1.  You have been seen because your long-term problems with atrial fibrillation have happened again or gotten worse.             2.  La fibrilacin auricular es cuando su corazn no late normalmente. La parte superior del corazn tiene sacudidas (tiembla) en vez de bombear al resto del corazn de [place] regular. Cuando usted tiene Hydrologist, sus latidos pueden ser notorios (palpitaciones). Tambin puede sentirse cansado (fatiga) o con falta de aire. Puede parecer como si sus latidos no tienen un ritmo normal. Puede parecer que sus latidos no son constantes y no siguen su patrn habitual. Este patrn es llamado "irregularmente irregular". Si no se encuentra actualmente bajo tratamiento para controlar su ritmo cardiaco, su corazn podra estar latiendo muy rpido (ms de 120 veces por minuto).   2.  Atrial fibrillation is when your heart does not beat normally. The top of the heart shakes (quivers) instead of squeezing blood to the rest of the heart in a regular way. When you have atrial fibrillation, your heartbeats may be noticeable (palpitations). You may also feel tired (fatigue) or be short of breath. It may seem like your heartbeats do not have a normal rhythm. It may seem like  your heartbeats are random and are not following their regular pattern. This pattern is called "irregularly irregular." If you are not already on medicine to control your heart rate, your heart may be beating very fast (faster than 120 times a minute).             3.  Si ha tenido sntomas por ms de 2-3 das y no ha Veterinary surgeon, tendr que tomar uno con frecuencia durante unas cuantas semanas. Entonces, un cardilogo (especialista del corazn) tratar de regresar su ritmo a la normalidad. Esto es porque despus de 2-3 das de sufrir fibrilacin auricular, se pueden formar cogulos en el corazn. Si los cogulos se desprenden, pueden causar un derrame cerebral.   3.  If you have had  symptoms for more than 2-3 days and do not already take a blood thinner, you will often take a blood thinner for a few weeks. A cardiologist (heart doctor) will then try to get your rhythm back to normal. This is because after 2-3 days of atrial fibrillation, blood clots can form in the heart. If the blood clots break loose, they can cause a stroke.             4.  A menudo, la fibrilacin auricular es tratada con medicamento para aminorar el ritmo cardiaco y para que la sangre se vuelva ms lquida. Usted tiene un bajo riesgo de complicaciones con la cardioversin si ha tenido sntomas por menos de 48 horas. Su riesgo tambin puede ser bajo si su sangre es lo suficientemente lquida (el ndice Internacional Normalizado correcto). Esto significa que la cardioversin sera muy segura para usted.   4.  Often, atrial fibrillation is treated with medicine to slow your heart rate and thin your blood. You have a low risk of cardioversion complications if you have had symptoms for less than 48 hours. Your risk may also be small if your blood is thin enough (the right International Normalized Ratio, or INR). This means that cardioversion would be quite safe for you.             5.  SIN CARDIOVERSIN:    5.  NO CARDIOVERSION:      * Su mdico no cambi su ritmo cardiaco a la normalidad hoy. Un mdico podra no querer regresar su ritmo cardiaco a la normalidad por algunas razones. La razn ms comn es si una persona tiene fibrilacin auricular que inici hace ms de 48 horas. Esto es por el riesgo de que haya un cogulo en el corazn. Este riesgo es ms alto para las personas que tienen fibrilacin auricular por ms de 48 horas. El cogulo puede pasar del corazn al cerebro y causar un derrame cerebral. Esto podra ser ocasionado si el ritmo cardiaco es cambiado de las sacudidas (temblores) a un bombeo normal. Si ha tenido sntomas por ms de 2-3 das, tendr que tomar un anticoagulante con frecuencia durante unas cuantas semanas. Entonces, un cardilogo (especialista del corazn) tratar de regresar su ritmo a la normalidad. Esto es porque despus de 2-3 das de sufrir fibrilacin auricular, el riesgo de un derrame cerebral es ms alto.    * Your doctor did not change your heart rhythm back to a normal rhythm today. A doctor may not want to change a rhythm back to a normal rhythm from atrial fibrillation for a few reasons. The most common reason is if a person has atrial fibrillation that started more than 48 hours ago. This is because of a risk that there is a clot in the heart. This risk is higher for people who have atrial fibrillation for more than 48 hours. The clot could move out of the heart to the brain and cause a stroke. Changing the heart rhythm from the shaking (quivering) to normal pumping could make this happen. If you have had symptoms for more than 2-3 days, you will often have to take a blood thinner for a few weeks. A cardiologist (heart doctor) will then try to change your rhythm to a more regular one. This is because after 2-3 days of atrial fibrillation, the risk of stroke is higher.             6.  Es importante que consulte a su mdico de atencin primaria o  a su cardilogo  nuevamente. Vuelva a su mdico en una semana.   6.  It is important that you see your primary care doctor or cardiologist again. Get checked again within one week.             7.  Muchos pacientes pueden tener fibrilacin auricular sin ningn problema. Debera volver para ser revisado en caso de tener ms problemas.   7.  Many patients can have atrial fibrillation without any problems. You should come back again to be checked if you have more problems.             8.  DEBE BUSCAR ATENCIN MDICA INMEDIATAMENTE, AQU O EN LA SALA DE EMERGENCIAS MS CERCANA, SI SE PRESENTA CUALQUIERA DE LAS SIGUIENTES SITUACIONES:   8.  YOU SHOULD SEEK MEDICAL ATTENTION IMMEDIATELY, EITHER HERE OR AT THE NEAREST EMERGENCY DEPARTMENT, IF ANY OF THE FOLLOWING OCCURS:      * Tiene dolor de pecho, opresin, presin o indigestin.    * You have chest pain, tightness, pressure or indigestion.      * Tiene falta de aire o problemas para respirar.    * You have shortness of breath or any problems breathing.      * Tiene problemas para respirar mientras est [employer] o le falta el aire al despertar.    * You have problems breathing when lying down or if you are short of breath when you wake up.      * Sus piernas estn hinchadas.    * There is swelling in your legs.      * Se siente cansado (fatiga), pierde peso o tiene temblores o confusin.    * You feel tired (fatigue), lose weight or have problems with shaking (tremors) or confusion.      * No puede hablar claramente (habla de manera confusa), un lado de su cara se descuelga o siente debilidad en sus brazos o piernas (especialmente en un solo lado).    * You cannot speak clearly (slurring), one side of your face droops or you feel weak in the arms or legs (especially on one side).      * Se desmaya o est a punto de desmayarse.    * If you faint or almost faint.      * Se siente mareado, con vrtigo o confuso.    * You feel lightheaded,  dizzy or confused.

## 2017-10-27 NOTE — ED Triage Notes (Signed)
[facility] EM[facility]GENC[facility] DEPARTMENT TRIAGE NOTE     [name] is a 62 y.o. female who presents to the ED with a chief complaint of: Palpitations     Onset: 1 hr PTA     BIBA fr home for palpitations that woke pt up fr sleep. Denies SOB or CP. SBP 200, HR 100-130, and Dexi 375 en route.     LOC: A&Ox4    Vitals: Blood pressure 149/74, pulse 92, temperature 98.7 F (37.1 C), temperature source Oral, resp. rate 18, height 5\' 1"  (1.549 m), weight 87.1 kg, SpO2 99 %.    Meds taken prior to ED arrival: None

## 2017-10-29 LAB — ECG 12-LEAD
Atrial Rate: 105 {beats}/min
Q-T Interval: 352 ms
QRS Duration: 82 ms
QTC Calculation (Bezet): 456 ms
R Axis: 61 degrees
T Axis: 28 degrees
Ventricular Rate: 101 {beats}/min

## 2017-10-30 ENCOUNTER — Telehealth (INDEPENDENT_AMBULATORY_CARE_PROVIDER_SITE_OTHER): Payer: Self-pay | Admitting: Internal Medicine

## 2017-10-30 NOTE — Telephone Encounter (Signed)
Attempted to call patient for ED follow up visit. LMTCB.

## 2017-11-02 ENCOUNTER — Telehealth (INDEPENDENT_AMBULATORY_CARE_PROVIDER_SITE_OTHER): Payer: Self-pay | Admitting: Internal Medicine

## 2017-11-02 NOTE — Telephone Encounter (Signed)
Patient's daughter called on behalf of patient. C/O face numbness x 1 d; believes r/t tooth due to tooth pain. Accompanied by palpitations, though notes Hx of chronic afib. Denies fever, but does have flushing in the face. Denies slurring, droopiness, chest pain, SoB.    Per Dr. Yancey Flemings, advised to go to an urgent care. Patient's daughter verbalized understanding.

## 2017-11-09 ENCOUNTER — Telehealth (INDEPENDENT_AMBULATORY_CARE_PROVIDER_SITE_OTHER): Payer: Self-pay | Admitting: Internal Medicine

## 2017-11-09 NOTE — Telephone Encounter (Signed)
Spoke with pt. Dtr. And she stated her mother still has a cough. Has been seen in ER previously. Agreed to appt on 11/22/2017 at 2pm with Dr. Charlesetta Ivory.

## 2017-11-09 NOTE — Telephone Encounter (Signed)
Patient is requesting to speak with nurse regarding ED visit. Patient would like to schedule f/u appointment with pcp and declined to wait until November to see provider. Patient continues to have a cough and would medication to be prescribed. Please advise.    Patient can be reached at [phone].    Thank you

## 2017-11-13 ENCOUNTER — Encounter (INDEPENDENT_AMBULATORY_CARE_PROVIDER_SITE_OTHER): Payer: Self-pay | Admitting: Specialist

## 2017-11-13 DIAGNOSIS — Z794 Long term (current) use of insulin: Secondary | ICD-10-CM

## 2017-11-13 DIAGNOSIS — E119 Type 2 diabetes mellitus without complications: Secondary | ICD-10-CM

## 2017-11-22 ENCOUNTER — Other Ambulatory Visit (INDEPENDENT_AMBULATORY_CARE_PROVIDER_SITE_OTHER): Payer: Self-pay | Admitting: Internal Medicine

## 2017-11-22 ENCOUNTER — Ambulatory Visit (INDEPENDENT_AMBULATORY_CARE_PROVIDER_SITE_OTHER): Payer: HMO | Admitting: Internal Medicine

## 2017-11-22 ENCOUNTER — Telehealth (INDEPENDENT_AMBULATORY_CARE_PROVIDER_SITE_OTHER): Payer: Self-pay | Admitting: Internal Medicine

## 2017-11-22 DIAGNOSIS — E669 Obesity, unspecified: Secondary | ICD-10-CM

## 2017-11-22 MED ORDER — METFORMIN HCL 1000 MG PO TABS
1000.0000 mg | ORAL_TABLET | Freq: Two times a day (BID) | ORAL | 1 refills | Status: DC
Start: 2017-11-22 — End: 2018-03-29

## 2017-11-22 NOTE — Telephone Encounter (Signed)
Refill for metformin was sent to preferred pharmacy

## 2017-11-22 NOTE — Telephone Encounter (Signed)
Patient is scheduled for November but needs a refill on the below medication.    metFORMIN (GLUCOPHAGE) 1000 MG tablet (Order [id number])   Medication   Date: 09/07/2017 Department: [facility] Ordering/Authorizing: Standley Dakins, MD   Outpatient Medication Detail      Disp Refills Start End    metFORMIN (GLUCOPHAGE) 1000 MG tablet 180 tablet 1 09/07/2017 09/07/2018    Sig - Route: Take 1 tablet (1,000 mg total) by mouth 2 (two) times daily with meals - Oral    Sent to pharmacy as: metFORMIN (GLUCOPHAGE) 1000 MG tablet    E-Prescribing Status: Receipt confirmed by pharmacy (09/07/2017 12:02 PM EDT)    Associated Diagnoses     Diabetes mellitus type 2 in obese - Primary       Pharmacy     [address] [place], [place] - [phone] [facility]

## 2017-12-14 ENCOUNTER — Encounter (INDEPENDENT_AMBULATORY_CARE_PROVIDER_SITE_OTHER): Payer: Self-pay | Admitting: Internal Medicine

## 2017-12-25 ENCOUNTER — Encounter (INDEPENDENT_AMBULATORY_CARE_PROVIDER_SITE_OTHER): Payer: Self-pay | Admitting: Internal Medicine

## 2017-12-25 ENCOUNTER — Ambulatory Visit (INDEPENDENT_AMBULATORY_CARE_PROVIDER_SITE_OTHER): Payer: HMO | Admitting: Internal Medicine

## 2017-12-25 VITALS — BP 146/79 | HR 75 | Temp 98.1°F | Wt 191.0 lb

## 2017-12-25 DIAGNOSIS — Z7984 Long term (current) use of oral hypoglycemic drugs: Secondary | ICD-10-CM

## 2017-12-25 DIAGNOSIS — I10 Essential (primary) hypertension: Secondary | ICD-10-CM

## 2017-12-25 DIAGNOSIS — E669 Obesity, unspecified: Secondary | ICD-10-CM

## 2017-12-25 DIAGNOSIS — Z23 Encounter for immunization: Secondary | ICD-10-CM

## 2017-12-25 DIAGNOSIS — Z794 Long term (current) use of insulin: Secondary | ICD-10-CM

## 2017-12-25 DIAGNOSIS — E782 Mixed hyperlipidemia: Secondary | ICD-10-CM

## 2017-12-25 DIAGNOSIS — E1169 Type 2 diabetes mellitus with other specified complication: Secondary | ICD-10-CM

## 2017-12-25 MED ORDER — LISINOPRIL 40 MG PO TABS
40.0000 mg | ORAL_TABLET | Freq: Every day | ORAL | 1 refills | Status: DC
Start: 2017-12-25 — End: 2018-03-29

## 2017-12-25 MED ORDER — ATORVASTATIN CALCIUM 20 MG PO TABS
20.0000 mg | ORAL_TABLET | Freq: Every day | ORAL | 1 refills | Status: DC
Start: 2017-12-25 — End: 2018-03-29

## 2017-12-25 NOTE — Progress Notes (Signed)
Have you seen any specialists/other providers since your last visit with [place]?    No    Arm preference verified?   Yes    The patient is due for foot exam, pap smear, depression screening, influenza vaccine, shingles vaccine and [facility] Care Plan

## 2017-12-25 NOTE — Progress Notes (Signed)
Subjective:      Patient ID: [name] is a 62 y.o. female     Chief Complaint   Patient presents with   . Diabetes     f/u   . Hypertension     f/u   . Hyperlipidemia     f/u        HPI   Patient is a 62 year old Hispanic female with history of diabetes mellitus 2 now followed by endocrinologist Dr. Benjiman Core, hypertension, hyperlipidemia, afib followed by cardiologist Dr. Fransisco Beau, among others, who is here today for the above.     Since her last visit w/ me in 09/2017, she has gained 2 lbs.      She was seen in the ED w/ palpitations on 10/26/2017.  She has an appointment with her cardiologist Dr. Fransisco Beau in 2 weeks or so, per patient.     DM2 - Last A1C 09/2017 was 8.2%. patient has a f/u appointment scheduled w/ endocrinologist Dr. Benjiman Core in December.  She reports compliance with insulin and metformin as below. She reports she has been checking her glucose at home but does not bring in the readings (reports they are being forwarded to her endocrinologist). Reports fasting glucose readings have been up to the 140s.  Denies any polydipsia or polyuria.    Hypertension - Reports compliance w/ Norvasc 5mg  daily and lisinopril 40mg  daily. Patient denies any frequent headaches, chest pain, shortness of breath, orthopnea or PND.  Admits occasional palpitations. Reports home BPs have been in the 140s systolic.      Hyperlipidemia - Reports compliance w/ Lipitor 20mg  daily. Last lipid panel done in 09/2017 showed good control. Denies any myalgias or abdominal pain.     The following sections were reviewed this encounter by the provider:   Tobacco  Allergies  Meds  Problems  Med Hx  Surg Hx  Fam Hx         Past Medical History:   Diagnosis Date   . Diabetes mellitus type 2 in obese    . Diabetic retinopathy 06/28/2013    Of the right eye, per patient   . Glaucoma 06/01/2012   . Hyperlipidemia    . Hypertension    . Obesity    . Venous insufficiency 09/17/2014    Followed at [facility].  Wears compression  stockings.        Social History     Socioeconomic History   . Marital status: Single     Spouse name: Not on file   . Number of children: 3   . Years of education: Not on file   . Highest education level: Not on file   Occupational History   . Occupation: Maintenance     Employer: Civil engineer, contracting   Social Needs   . Financial resource strain: Not on file   . Food insecurity:     Worry: Not on file     Inability: Not on file   . Transportation needs:     Medical: Not on file     Non-medical: Not on file   Tobacco Use   . Smoking status: Never Smoker   . Smokeless tobacco: Never Used   Substance and Sexual Activity   . Alcohol use: No   . Drug use: No   . Sexual activity: Not Currently   Lifestyle   . Physical activity:     Days per week: Not on file     Minutes per session: Not on file   .  Stress: Not on file   Relationships   . Social connections:     Talks on phone: Not on file     Gets together: Not on file     Attends religious service: Not on file     Active member of club or organization: Not on file     Attends meetings of clubs or organizations: Not on file     Relationship status: Not on file   . Intimate partner violence:     Fear of current or ex partner: Not on file     Emotionally abused: Not on file     Physically abused: Not on file     Forced sexual activity: Not on file   Other Topics Concern   . Not on file   Social History Narrative    Originally from [place].  Lives with her daughter.    Exercise: walks frequently       Allergies   Allergen Reactions   . Shellfish-Derived Products        Current Outpatient Medications   Medication Sig Dispense Refill   . amLODIPine (NORVASC) 5 MG tablet Take 5 mg by mouth daily         . apixaban (ELIQUIS) 5 MG Take 5 mg by mouth every 12 (twelve) hours.     Marland Kitchen atorvastatin (LIPITOR) 20 MG tablet Take 1 tablet (20 mg total) by mouth daily 90 tablet 1   . Blood Glucose Monitoring Suppl (ONETOUCH ULTRA 2) w/Device Kit [employer]     . Calcium Carbonate-Vitamin  D (CALCIUM-D PO) Take 1 tablet by mouth daily.     . insulin NPH (HUMULIN,NOVOLIN) 100 UNIT/ML injection Administer 50 units subcutaneously with breakfast and 24 units with dinner 80 mL 0   . Insulin Syringe-Needle U-100 (B-D INS SYR ULTRAFINE 1CC/31G) 31G X 5/16" 1 ML Misc Administer insulin subcutaneously 2 times a day 100 each 1   . lisinopril (PRINIVIL,ZESTRIL) 40 MG tablet Take 1 tablet (40 mg total) by mouth daily 90 tablet 1   . metFORMIN (GLUCOPHAGE) 1000 MG tablet Take 1 tablet (1,000 mg total) by mouth 2 (two) times daily with meals 180 tablet 1   . ONE TOUCH ULTRA TEST test strip Check glucose once a day.  ICD-10: E11.9 200 each 1   . TRAVATAN Z 0.004 % Solution ophthalmic solution        No current facility-administered medications for this visit.        Review of Systems   Constitutional: Positive for fatigue.   Respiratory: Negative for chest tightness and shortness of breath.    Cardiovascular: Positive for palpitations (occasionally). Negative for chest pain.   Gastrointestinal: Negative for abdominal pain, constipation, diarrhea, nausea and vomiting.   Endocrine: Negative for polydipsia and polyuria.   Musculoskeletal: Negative for myalgias.   Neurological: Positive for headaches. Negative for dizziness and light-headedness. Facial asymmetry: occasional.         Objective:   BP 146/79 (BP Site: Right arm, Patient Position: Sitting, Cuff Size: Medium)   Pulse 75   Temp 98.1 F (36.7 C) (Oral)   Wt 86.6 kg (191 lb)   BMI 36.09 kg/m     Physical Exam  Vitals signs reviewed.   Constitutional:       General: She is not in acute distress.     Appearance: She is well-developed. She is not diaphoretic.   Eyes:      General: No scleral icterus.  Conjunctiva/sclera: Conjunctivae normal.   Cardiovascular:      Rate and Rhythm: Normal rate and regular rhythm.      Heart sounds: No murmur. No friction rub. No gallop.    Pulmonary:      Effort: Pulmonary effort is normal. No respiratory distress.       Breath sounds: No wheezing or rales.   Abdominal:      Palpations: Abdomen is soft. There is no mass.      Tenderness: There is no tenderness.   Skin:     General: Skin is warm and dry.   Neurological:      Mental Status: She is alert and oriented to person, place, and time.   Psychiatric:         Behavior: Behavior normal.         Lab Results   Component Value Date    HGBA1C 8.2 (H) 09/11/2017     Lab Results   Component Value Date    WBC 7.79 10/27/2017    HGB 11.1 (L) 10/27/2017    HCT 34.6 (L) 10/27/2017    MCV 91.8 10/27/2017    PLT 213 10/27/2017     '  Chemistry        Component Value Date/Time    NA 137 10/27/2017 0013    K 4.0 10/27/2017 0013    CL 105 10/27/2017 0013    CL 104 02/15/2014 0733    CO2 22 10/27/2017 0013    BUN 12.0 10/27/2017 0013    CREAT 1.0 10/27/2017 0013    CREAT 0.66 09/11/2017 0000    CREAT 0.78 02/15/2014 0733    GLU 364 (H) 10/27/2017 0013        Component Value Date/Time    CA 8.6 10/27/2017 0013    ALKPHOS 138 (H) 10/27/2017 0013    AST 22 10/27/2017 0013    ALT 28 10/27/2017 0013    BILITOTAL 0.2 10/27/2017 0013          Lab Results   Component Value Date    CHOL 131 09/11/2017    CHOL 131 01/18/2017    CHOL 136 02/22/2016     Lab Results   Component Value Date    HDL 55 09/11/2017    HDL 65 01/18/2017    HDL 52 02/22/2016     Lab Results   Component Value Date    LDL 57 09/11/2017    LDL 54 01/18/2017    LDL 65 02/22/2016     Lab Results   Component Value Date    TRIG 95 09/11/2017    TRIG 61 01/18/2017    TRIG 95 02/22/2016     Lab Results   Component Value Date    TSH 1.400 09/11/2017    TSH 1.17 06/28/2013     No results found for: VITD, 25HYDROXYDTO     Assessment:     1. Diabetes mellitus type 2 in obese  - Suboptimally controlled w/ goal A1C of under 7%  - Recheck A1C to reevaluate  - Encourage patient to keep appointment with endocrinologist Dr. Benjiman Core for continued management  - Encourage weight loss and monitoring carbohydrate intake  - For now continue current insulin and  metformin  - Comprehensive metabolic panel  - Hemoglobin A1C    2. Essential hypertension  - Suboptimally controlled w/ BP above goal of at least under 140/8mmHg  - She prefers not to make adjustments to her medications for blood pressure and would like to discuss  w/ her cardiologist, Dr. Fransisco Beau  - For now continue current antihypertensive medications.   - lisinopril (PRINIVIL,ZESTRIL) 40 MG tablet; Take 1 tablet (40 mg total) by mouth daily  Dispense: 90 tablet; Refill: 1    3. Mixed hyperlipidemia  - Well-controlled on last lipid panel that was last done  - Continue current statin medication  - Encourage heart healthy diet such as the mediterranean diet together with aerobic exercise for 30-45 minutes 4-5 days a week as tolerated  - Comprehensive metabolic panel  - Lipid panel  - atorvastatin (LIPITOR) 20 MG tablet; Take 1 tablet (20 mg total) by mouth daily  Dispense: 90 tablet; Refill: 1    4. Need for influenza vaccination  - Flu vacc quad recombinant pres free 18 yrs& up        Plan:     Plan is as above.     Return in about 3 months (around 03/27/2018) for f/u hypertension.    Standley Dakins, MD

## 2017-12-26 LAB — COMPREHENSIVE METABOLIC PANEL
ALT: 20 IU/L (ref 0–32)
AST (SGOT): 22 IU/L (ref 0–40)
Albumin/Globulin Ratio: 1.4 (ref 1.2–2.2)
Albumin: 4.2 g/dL (ref 3.6–4.8)
Alkaline Phosphatase: 106 IU/L (ref 39–117)
BUN / Creatinine Ratio: 23 (ref 12–28)
BUN: 16 mg/dL (ref 8–27)
Bilirubin, Total: 0.3 mg/dL (ref 0.0–1.2)
CO2: 23 mmol/L (ref 20–29)
Calcium: 9.8 mg/dL (ref 8.7–10.3)
Chloride: 100 mmol/L (ref 96–106)
Creatinine: 0.69 mg/dL (ref 0.57–1.00)
EGFR: 108 mL/min/{1.73_m2} (ref >59)
EGFR: 94 mL/min/{1.73_m2} (ref >59)
Globulin, Total: 3 g/dL (ref 1.5–4.5)
Glucose: 104 mg/dL — ABNORMAL HIGH (ref 65–99)
Potassium: 4.2 mmol/L (ref 3.5–5.2)
Protein, Total: 7.2 g/dL (ref 6.0–8.5)
Sodium: 140 mmol/L (ref 134–144)

## 2017-12-26 LAB — HEMOGLOBIN A1C: Hemoglobin A1C: 7.6 % — ABNORMAL HIGH (ref 4.8–5.6)

## 2017-12-26 LAB — LIPID PANEL
Cholesterol / HDL Ratio: 2.9 ratio (ref 0.0–4.4)
Cholesterol: 168 mg/dL (ref 100–199)
HDL: 57 mg/dL (ref >39)
LDL Calculated: 90 mg/dL (ref 0–99)
Triglycerides: 107 mg/dL (ref 0–149)
VLDL Calculated: 21 mg/dL (ref 5–40)

## 2017-12-27 ENCOUNTER — Encounter (INDEPENDENT_AMBULATORY_CARE_PROVIDER_SITE_OTHER): Payer: Self-pay | Admitting: Internal Medicine

## 2018-01-08 ENCOUNTER — Encounter (INDEPENDENT_AMBULATORY_CARE_PROVIDER_SITE_OTHER): Payer: Self-pay | Admitting: Specialist

## 2018-01-08 ENCOUNTER — Ambulatory Visit (INDEPENDENT_AMBULATORY_CARE_PROVIDER_SITE_OTHER): Payer: HMO | Admitting: Specialist

## 2018-01-08 VITALS — BP 147/73 | HR 73 | Wt 188.0 lb

## 2018-01-08 DIAGNOSIS — Z794 Long term (current) use of insulin: Secondary | ICD-10-CM

## 2018-01-08 DIAGNOSIS — E782 Mixed hyperlipidemia: Secondary | ICD-10-CM

## 2018-01-08 DIAGNOSIS — E119 Type 2 diabetes mellitus without complications: Secondary | ICD-10-CM

## 2018-01-08 DIAGNOSIS — I10 Essential (primary) hypertension: Secondary | ICD-10-CM

## 2018-01-08 NOTE — Patient Instructions (Signed)
You have type 2 diabetes. Your HbA1c in clinic today is 7.6%         Goal of HbA1c is < 7% to prevent complications to eye, kidney, nerve . Also reduces the risk for cardiovascular disease.      Please check glucose before taking Insulin twice daily    Continue Humulin N  insulin 52 units before breakfast and  Emphasize  to inject 26  units before dinner      If glucose is < 110- take 1/2 dose of insulin    If glucose is < 80- dont take insulin, please take 4 glucose tablets or drink 8 oz juice and recheck in 20 minutes     Please continue metformin as  1000 mg twice daily    Please check glucose before breakfast and before dinner ( ie. before taking insulin)    Please bring your glucose meter and follow up in clinic in  3 months after labs are done     Please walk daily as tolerated upto 30 minutes daily.    Pease limit your carbohydrate in your meals.    Please attend diabetes education/diet classes at your convenience.    Please visit American Diabetes Association website for more information.    Also please wear a medical alert.    You should have eye exam once a year and foot exam once a year for diabetes related care.

## 2018-01-08 NOTE — Progress Notes (Signed)
Chief complaint   Type 2 diabetes - uncontrolled   Hypertension    HISTORY OF PRESENT ILLNESS:  [name] is a pleasant 62 year old who presents in our clinic for  Follow up  Visit.       She  was diagnosed with type 2 diabetes in  1993.  She currently manages her type 2 diabetes by taking Humulin N   mix insulin 52 units prior to breakfast and 22 units prior to dinner.  In  addition, she is on metformin 1000 mg twice daily with meals      She informs me that she has been taking 22 units before dinner and sometimes 26 units before dinner but not consistently .       In terms of glycemic control, HbA1c has improved to 7.6% compared to 8.1% before     In terms of glucose values, she checks  Twice daily and the are 80-200 mg/dl over the past 2 weeks      I terms of symptoms of hypoglycemia , she can sense the low glucose values in the 70's accompanied by shaking and diaphoresis and usually treats with candy or juice. She does not wear a medical alert and does not carry glucose tablets       In terms of contributing factors  to type 2 diabetes, her family history of  type 2 diabetes, sedentary lifestyle, progression of diabetes,  indiscretion to diet.     In terms of associated microvascular complications from type 2 diabetes,  She has mild microalbuminuria .In terms of peripheral  neuropathy, she does complain of some paresthesias, numbness in her lower  extremities.  No complications of open ulcers, lesions, or amputations.   In terms of retinopathy, the patient  has moderate non prliferative retinopathy.  I  do not have a recent retinopathy surveillance report.  She denies any eye  symptoms today.  She also has been recommended cataract surgery      In terms of associated conditions with her type 2 diabetes, the patient has  hypertension, hyperlipidemia, and follows with her primary care physician.   She has no known coronary artery disease.  She denies chest pain, shortness  of breath with exertion.  Per review  of note from cardiology office, she had a nuclear stress test earlier this year in April 2019 and it did not report ischemia       In terms of associated symptoms of hyperglycemia, she denies polyuria,  polyphagia, or polydipsia.     REVIEW OF SYSTEMS:  As mentioned above.  In addition, denies nausea, vomiting, abdominal pain,  hematuria, dysuria, chest pain, shortness of breath with exertion.  Denies  lower extremity pain or swelling.  Denies polyuria, polyphagia, or  polydipsia.  Denies paresthesias, numbness in her lower extremities.   Denies hair or skin changes, vision changes,  Denies chest pain, shortness of breath with exertion.    Denies any mood changes.  Denies any taste  changes. All other  systems reviewed and were negative.     PAST MEDICAL HISTORY,FAMILY HISTORY,PERSONAL AND SOCIAL HISTORY were reviewed and updated in epic care     PHYSICAL EXAMINATION:  VITAL SIGNS: reviewed and updated in epic care  GENERAL:  Pleasant, conversant, in no acute distress.  NECK:  Supple, no lymphadenopathy.  Thyroid palpable, not enlarged.  EXTREMITIES:  No edema seen.  No tremors seen.   SKIN:  Warm and dry.  No rash seen.  PSYCHIATRIC:  Mood and affect normal.  LABORATORY DATA:    Component      Latest Ref Rng & Units 12/25/2017 12/25/2017          12:00 AM 12:00 AM   Glucose      65 - 99 mg/dL  161 (H)   BUN      8 - 27 mg/dL  16   Creatinine      0.96 - 1.00 mg/dL  0.45   EGFR      >40 JW/JXB/1.47  94   BUN/Creatinine Ratio      12 - 28  23   Sodium      134 - 144 mmol/L  140   Potassium      3.5 - 5.2 mmol/L  4.2   Chloride      96 - 106 mmol/L  100   Carbon Dioxide, Whole Blood      20 - 29 mmol/L  23   Calcium      8.7 - 10.3 mg/dL  9.8   Protein, Total      6.0 - 8.5 g/dL  7.2   Albumin      3.6 - 4.8 g/dL  4.2   Globulin, Total      1.5 - 4.5 g/dL  3.0   Albumin/Globulin Ratio      1.2 - 2.2  1.4   Bilirubin, Total      0.0 - 1.2 mg/dL  0.3   Alkaline Phosphatase      39 - 117 IU/L  106   AST Whole Blood       0 - 40 IU/L  22   ALT, Whole Blood      0 - 32 IU/L  20   Cholesterol      100 - 199 mg/dL 829    Triglycerides      0 - 149 mg/dL 562    HDL      >13 mg/dL 57    VLDL Cholesterol Cal      5 - 40 mg/dL 21    Calculated LDL      0 - 99 mg/dL 90    CHOL/HDL Ratio      0.0 - 4.4 ratio 2.9    Hemoglobin A1C      4.8 - 5.6 % 7.6 (H)        ASSESSMENT AND PLAN:  [name] is a pleasant 62 year old who presents in our clinic for  Follow up  visit.     1.  Type 2 diabetes.  Hemoglobin A1c is not at goal but has improved compared to before     I reviewed with her goal of A1c is less than 7% to prevent microvascular  complications of neuropathy, nephropathy and retinopathy and to reduce the  risk of cardiovascular disease.     I did not recommend any further changes to insulin today but emphasized the importance of checking glucose values      I  did recommend her to check her blood glucose values before taking insulin in the morning and then again in the evening    Continue Novolog mix insulin 52 units before breakfast and emphasized to inject 26 units before dinner      If glucose is < 110- take 1/2 dose of insulin    If glucose is < 80- dont take insulin, please take 4 glucose tablets or drink 8 oz juice and recheck in 20 minutes     Please continue metformin as  1000 mg  twice daily with meals     In addition, I reviewed dietary recommendations, low carbohydrate meals,  regular exercise as tolerated to improve glycemic control.     For hypoglycemia, I recommended to carry glucose tablets, wear a medical  alert, to check glucose prior to taking insulin, driving and prolonged  physical activity.       Diabetes related complications:  A. Retinopathy.  Per the patient, she has moderate non proliferative retinopathy, last eye exam was   Requested to send records. Was recommended to consider cataract surgery  B. Neuropathy.  She has some symptoms of peripheral neuropathy.  Discussed  diabetic foot hygiene. 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 Nephropathy.  She is currently on ACE inhibitor therapy.  Tighter  glycemic control was emphasized to improve microalbuminuria .     2.  Hypertension.  She is on lisinopril and beta blocker.       3.  Hyperlipidemia.  Goal of LDL is less 100.  She is currently on statin  therapy.  No known coronary disease. Follows with cardiology     It was a pleasure taking care of her in our clinic today.  We will follow  up in 3-4  months after labs are done

## 2018-01-16 ENCOUNTER — Encounter (INDEPENDENT_AMBULATORY_CARE_PROVIDER_SITE_OTHER): Payer: Self-pay | Admitting: Internal Medicine

## 2018-02-03 ENCOUNTER — Other Ambulatory Visit (INDEPENDENT_AMBULATORY_CARE_PROVIDER_SITE_OTHER): Payer: Self-pay | Admitting: Internal Medicine

## 2018-02-03 DIAGNOSIS — E669 Obesity, unspecified: Secondary | ICD-10-CM

## 2018-02-17 ENCOUNTER — Other Ambulatory Visit (INDEPENDENT_AMBULATORY_CARE_PROVIDER_SITE_OTHER): Payer: Self-pay | Admitting: Internal Medicine

## 2018-02-17 DIAGNOSIS — E669 Obesity, unspecified: Secondary | ICD-10-CM

## 2018-03-13 ENCOUNTER — Encounter (INDEPENDENT_AMBULATORY_CARE_PROVIDER_SITE_OTHER): Payer: Self-pay | Admitting: Internal Medicine

## 2018-03-13 ENCOUNTER — Other Ambulatory Visit: Payer: Self-pay | Admitting: Cardiovascular Disease

## 2018-03-13 DIAGNOSIS — R911 Solitary pulmonary nodule: Secondary | ICD-10-CM

## 2018-03-29 ENCOUNTER — Encounter (INDEPENDENT_AMBULATORY_CARE_PROVIDER_SITE_OTHER): Payer: Self-pay | Admitting: Internal Medicine

## 2018-03-29 ENCOUNTER — Ambulatory Visit (INDEPENDENT_AMBULATORY_CARE_PROVIDER_SITE_OTHER): Payer: HMO | Admitting: Internal Medicine

## 2018-03-29 VITALS — BP 121/64 | HR 71 | Temp 97.7°F | Wt 194.0 lb

## 2018-03-29 DIAGNOSIS — R0600 Dyspnea, unspecified: Secondary | ICD-10-CM

## 2018-03-29 DIAGNOSIS — R0609 Other forms of dyspnea: Secondary | ICD-10-CM

## 2018-03-29 DIAGNOSIS — E669 Obesity, unspecified: Secondary | ICD-10-CM

## 2018-03-29 DIAGNOSIS — D649 Anemia, unspecified: Secondary | ICD-10-CM

## 2018-03-29 DIAGNOSIS — E1169 Type 2 diabetes mellitus with other specified complication: Secondary | ICD-10-CM

## 2018-03-29 DIAGNOSIS — I10 Essential (primary) hypertension: Secondary | ICD-10-CM

## 2018-03-29 DIAGNOSIS — E782 Mixed hyperlipidemia: Secondary | ICD-10-CM

## 2018-03-29 DIAGNOSIS — R5383 Other fatigue: Secondary | ICD-10-CM

## 2018-03-29 MED ORDER — LISINOPRIL 40 MG PO TABS
40.0000 mg | ORAL_TABLET | Freq: Every day | ORAL | 1 refills | Status: DC
Start: 2018-03-29 — End: 2018-07-19

## 2018-03-29 MED ORDER — METFORMIN HCL 1000 MG PO TABS
1000.0000 mg | ORAL_TABLET | Freq: Two times a day (BID) | ORAL | 1 refills | Status: DC
Start: 2018-03-29 — End: 2018-07-19

## 2018-03-29 MED ORDER — ATORVASTATIN CALCIUM 20 MG PO TABS
20.0000 mg | ORAL_TABLET | Freq: Every day | ORAL | 1 refills | Status: DC
Start: 2018-03-29 — End: 2018-07-19

## 2018-03-29 NOTE — Progress Notes (Signed)
Have you seen any specialists/other providers since your last visit with [place]?    Yes, Ophthalmologist, Cardiology      Arm preference verified?   Yes    The patient is due for foot exam, pap smear, shingles vaccine and [facility] Care Plan

## 2018-03-29 NOTE — Progress Notes (Signed)
Subjective:      Patient ID: [name] is a 63 y.o. female     Chief Complaint   Patient presents with   . Hypertension     f/u   . Hyperlipidemia     f/u   . Shortness of Breath        HPI   Patient is a89 year old Hispanic female with history of diabetes mellitus 2now followed by endocrinologist Dr. Benjiman Core, hypertension, hyperlipidemia, afib followed by cardiologist Dr. Fransisco Beau, among others, who is here today for the above.     Patient has gained 3 lbs since her last visit w/ me.      She has been seeing her cardiologist Dr. Fransisco Beau for evaluation of shortness of breath on exertion.  She reports she had a CXR done that showed a possible RUQ nodule for which she is scheduled to have a chest CT done.      She also reports increased fatigue and was given orders for labs which she would like to have done today.     She is scheduled to see her endocrinologist Dr. Benjiman Core in March for f/u of diabetes.  She reports she has been under a lot of stress as her mother passed away recently. She reports compliance w/ all medications for diabetes as below and is requesting prescriptions as she does not have enough to last until her appointment with Dr. Benjiman Core.  Does not bring her glucometer today but reports readings have been up to 350s fasting.     HTN - Reports compliance w/ Norvasc 5mg  daily and lisinopril 40mg  daily. Patient denies any frequent headaches, chest pain, shortness of breath, orthopnea or PND.Admits occasional palpitations.Admits she has not been checking her BP regularly.     Hyperlipidemia - Reports compliance w/ Lipitor 20mg  daily. Last lipid panel done 12/2017 showed good control. Denies any myalgias or abdominal pain.     The following sections were reviewed this encounter by the provider:   Tobacco         Past Medical History:   Diagnosis Date   . Diabetes mellitus type 2 in obese    . Diabetic retinopathy 06/28/2013    Of the right eye, per patient   . Glaucoma 06/01/2012   . Hyperlipidemia    .  Hypertension    . Obesity    . Venous insufficiency 09/17/2014    Followed at [facility].  Wears compression stockings.        Social History     Socioeconomic History   . Marital status: Single     Spouse name: Not on file   . Number of children: 3   . Years of education: Not on file   . Highest education level: Not on file   Occupational History   . Occupation: Maintenance     Employer: Civil engineer, contracting   Social Needs   . Financial resource strain: Not on file   . Food insecurity:     Worry: Not on file     Inability: Not on file   . Transportation needs:     Medical: Not on file     Non-medical: Not on file   Tobacco Use   . Smoking status: Never Smoker   . Smokeless tobacco: Never Used   Substance and Sexual Activity   . Alcohol use: No   . Drug use: No   . Sexual activity: Not Currently   Lifestyle   . Physical activity:  Days per week: Not on file     Minutes per session: Not on file   . Stress: Not on file   Relationships   . Social connections:     Talks on phone: Not on file     Gets together: Not on file     Attends religious service: Not on file     Active member of club or organization: Not on file     Attends meetings of clubs or organizations: Not on file     Relationship status: Not on file   . Intimate partner violence:     Fear of current or ex partner: Not on file     Emotionally abused: Not on file     Physically abused: Not on file     Forced sexual activity: Not on file   Other Topics Concern   . Not on file   Social History Narrative    Originally from [place].  Lives with her daughter.    Exercise: walks frequently       Allergies   Allergen Reactions   . Shellfish-Derived Products        Current Outpatient Medications   Medication Sig Dispense Refill   . amLODIPine (NORVASC) 5 MG tablet Take 5 mg by mouth daily         . apixaban (ELIQUIS) 5 MG Take 5 mg by mouth every 12 (twelve) hours.     Marland Kitchen atorvastatin (LIPITOR) 20 MG tablet Take 1 tablet (20 mg total) by mouth daily 90 tablet 1    . BD INSULIN SYRINGE ULTRAFINE 31G X 5/16" 1 ML Misc ADMINISTER INSULIN SUBCUATANEOUSLY TWICE A DAY 100 each 0   . Blood Glucose Monitoring Suppl (ONETOUCH ULTRA 2) w/Device Kit [employer]     . Calcium Carbonate-Vitamin D (CALCIUM-D PO) Take 1 tablet by mouth daily.     . insulin NPH (NOVOLIN N) 100 UNIT/ML injection INJECT 50 UNITS SUBCUTANEOUSLY WITH BREAKFST AND 24 UNITS WITH DINNER 9 mL 0   . Insulin Syringe-Needle U-100 (BD INSULIN SYRINGE ULTRAFINE) 31G X 5/16" 1 ML Misc BD Insulin Syringe Ultra-Fine 1 mL 31 gauge x 5/16"     . lisinopril (PRINIVIL,ZESTRIL) 40 MG tablet Take 1 tablet (40 mg total) by mouth daily 90 tablet 1   . metFORMIN (GLUCOPHAGE) 1000 MG tablet Take 1 tablet (1,000 mg total) by mouth 2 (two) times daily with meals 180 tablet 1   . metoprolol succinate XL (TOPROL-XL) 50 MG 24 hr tablet Take 1 tablet by mouth every evening     . TRAVATAN Z 0.004 % Solution ophthalmic solution        No current facility-administered medications for this visit.        Review of Systems   Constitutional: Negative for fatigue.   Respiratory: Positive for shortness of breath (on exertion, per patient) and wheezing. Negative for chest tightness.    Cardiovascular: Positive for palpitations (occasionally, on exertion). Negative for chest pain.   Gastrointestinal: Negative for abdominal pain, constipation, diarrhea, nausea and vomiting.   Endocrine: Negative for polydipsia and polyuria.   Musculoskeletal: Negative for myalgias.   Neurological: Positive for headaches (occasionally). Negative for dizziness and light-headedness.         Objective:   BP 121/64 (BP Site: Left arm, Patient Position: Sitting, Cuff Size: Medium)   Pulse 71   Temp 97.7 F (36.5 C) (Oral)   Wt 88 kg (194 lb)   BMI 36.66 kg/m  Physical Exam  Vitals signs reviewed.   Constitutional:       General: She is not in acute distress.     Appearance: She is well-developed. She is not diaphoretic.   Eyes:      General: No  scleral icterus.     Conjunctiva/sclera: Conjunctivae normal.   Cardiovascular:      Rate and Rhythm: Normal rate and regular rhythm.      Heart sounds: No murmur. No friction rub. No gallop.    Pulmonary:      Effort: Pulmonary effort is normal. No respiratory distress.      Breath sounds: No wheezing or rales.   Abdominal:      Palpations: Abdomen is soft. There is no mass.      Tenderness: There is no abdominal tenderness.   Skin:     General: Skin is warm and dry.   Neurological:      Mental Status: She is alert and oriented to person, place, and time.   Psychiatric:         Behavior: Behavior normal.         Lab Results   Component Value Date    HGBA1C 7.6 (H) 12/25/2017     Lab Results   Component Value Date    WBC 7.79 10/27/2017    HGB 11.1 (L) 10/27/2017    HCT 34.6 (L) 10/27/2017    MCV 91.8 10/27/2017    PLT 213 10/27/2017     '  Chemistry        Component Value Date/Time    NA 140 12/25/2017 0000    K 4.2 12/25/2017 0000    CL 100 12/25/2017 0000    CL 104 02/15/2014 0733    CO2 23 12/25/2017 0000    BUN 16 12/25/2017 0000    CREAT 0.69 12/25/2017 0000    CREAT 0.78 02/15/2014 0733    GLU 104 (H) 12/25/2017 0000    GLU 364 (H) 10/27/2017 0013        Component Value Date/Time    CA 9.8 12/25/2017 0000    ALKPHOS 106 12/25/2017 0000    AST 22 12/25/2017 0000    ALT 20 12/25/2017 0000    BILITOTAL 0.3 12/25/2017 0000          Lab Results   Component Value Date    CHOL 168 12/25/2017    CHOL 131 09/11/2017    CHOL 131 01/18/2017     Lab Results   Component Value Date    HDL 57 12/25/2017    HDL 55 09/11/2017    HDL 65 01/18/2017     Lab Results   Component Value Date    LDL 90 12/25/2017    LDL 57 09/11/2017    LDL 54 01/18/2017     Lab Results   Component Value Date    TRIG 107 12/25/2017    TRIG 95 09/11/2017    TRIG 61 01/18/2017     Lab Results   Component Value Date    TSH 1.400 09/11/2017    TSH 1.17 06/28/2013     No results found for: VITD, 25HYDROXYDTO     Assessment:     1. Diabetes mellitus type 2  in obese  - Suboptimally controlled on last A1C w/ goal of under 7%  - Encouraged her to keep her appointment w/ Dr. Benjiman Core  - Will repeat A1C to reevaluate  - For now, continue current medications  - Encourage her monitor carbohydrate intake  and exercise regularly  -  metFORMIN (GLUCOPHAGE) 1000 MG tablet; Take 1 tablet (1,000 mg total) by mouth 2 (two) times daily with meals  Dispense: 180 tablet; Refill: 1    2. Essential hypertension  - Well-controlled with blood pressure at goal of under 130/32mmHg  - Continue current medications  - Encourage low sodium diet and routine exercise as tolerated  - lisinopril (PRINIVIL,ZESTRIL) 40 MG tablet; Take 1 tablet (40 mg total) by mouth daily  Dispense: 90 tablet; Refill: 1    3. Mixed hyperlipidemia  - Well-controlled on last lipid panel that was last done  - Continue current statin medication  - Encourage heart healthy diet such as the mediterranean diet together with aerobic exercise for 30-45 minutes 4-5 days a week as tolerated  - atorvastatin (LIPITOR) 20 MG tablet; Take 1 tablet (20 mg total) by mouth daily  Dispense: 90 tablet; Refill: 1  - Comprehensive metabolic panel  - Lipid panel    4. Dyspnea on exertion  - Followed by cardiologist Dr. Fransisco Beau and undergoing workup  - Will check CBC to reevaluate known anemia  - CBC and differential  - Comprehensive metabolic panel    5. Normocytic anemia  - Will check labs below to further evaluate normocytic anemia  - Ferritin  - IRON PROFILE    6. Other fatigue  - Likely multifactorial w/ components of stress, diabetes, etc  - will check labs above as well as TSH to further evaluate  - TSH        Plan:     Plan is as above.     Return in about 3 months (around 06/27/2018) for annual exam.    Standley Dakins, MD

## 2018-03-30 LAB — CBC AND DIFFERENTIAL
Baso(Absolute): 0 10*3/uL (ref 0.0–0.2)
Basos: 0 %
Eos: 3 %
Eosinophils Absolute: 0.2 10*3/uL (ref 0.0–0.4)
Hematocrit: 35.7 % (ref 34.0–46.6)
Hemoglobin: 12.3 g/dL (ref 11.1–15.9)
Immature Granulocytes Absolute: 0 10*3/uL (ref 0.0–0.1)
Immature Granulocytes: 1 %
Lymphocytes Absolute: 1.8 10*3/uL (ref 0.7–3.1)
Lymphocytes: 23 %
MCH: 30.1 pg (ref 26.6–33.0)
MCHC: 34.5 g/dL (ref 31.5–35.7)
MCV: 88 fL (ref 79–97)
Monocytes Absolute: 0.8 10*3/uL (ref 0.1–0.9)
Monocytes: 11 %
Neutrophils Absolute: 4.8 10*3/uL (ref 1.4–7.0)
Neutrophils: 62 %
Platelets: 236 10*3/uL (ref 150–450)
RBC: 4.08 x10E6/uL (ref 3.77–5.28)
RDW: 13.3 % (ref 11.7–15.4)
WBC: 7.7 10*3/uL (ref 3.4–10.8)

## 2018-03-30 LAB — IRON PROFILE
Iron Saturation: 16 % (ref 15–55)
Iron: 49 ug/dL (ref 27–139)
TIBC: 307 ug/dL (ref 250–450)
UIBC: 258 ug/dL (ref 118–369)

## 2018-03-30 LAB — LIPID PANEL
Cholesterol / HDL Ratio: 1.8 ratio (ref 0.0–4.4)
Cholesterol: 123 mg/dL (ref 100–199)
HDL: 67 mg/dL (ref >39)
LDL Calculated: 42 mg/dL (ref 0–99)
Triglycerides: 70 mg/dL (ref 0–149)
VLDL Calculated: 14 mg/dL (ref 5–40)

## 2018-03-30 LAB — COMPREHENSIVE METABOLIC PANEL
ALT: 18 IU/L (ref 0–32)
AST (SGOT): 32 IU/L (ref 0–40)
Albumin/Globulin Ratio: 1.6 (ref 1.2–2.2)
Albumin: 4.2 g/dL (ref 3.8–4.8)
Alkaline Phosphatase: 89 IU/L (ref 39–117)
BUN / Creatinine Ratio: 14 (ref 12–28)
BUN: 10 mg/dL (ref 8–27)
Bilirubin, Total: 0.3 mg/dL (ref 0.0–1.2)
CO2: 23 mmol/L (ref 20–29)
Calcium: 8.9 mg/dL (ref 8.7–10.3)
Chloride: 101 mmol/L (ref 96–106)
Creatinine: 0.69 mg/dL (ref 0.57–1.00)
EGFR: 108 mL/min/{1.73_m2} (ref >59)
EGFR: 94 mL/min/{1.73_m2} (ref >59)
Globulin, Total: 2.7 g/dL (ref 1.5–4.5)
Glucose: 72 mg/dL (ref 65–99)
Potassium: 4.6 mmol/L (ref 3.5–5.2)
Protein, Total: 6.9 g/dL (ref 6.0–8.5)
Sodium: 140 mmol/L (ref 134–144)

## 2018-03-30 LAB — HEMOGLOBIN A1C: Hemoglobin A1C: 7.3 % — ABNORMAL HIGH (ref 4.8–5.6)

## 2018-03-30 LAB — TSH: TSH: 1.29 u[IU]/mL (ref 0.450–4.500)

## 2018-03-30 LAB — FERRITIN: Ferritin: 58 ng/mL (ref 15–150)

## 2018-04-03 ENCOUNTER — Ambulatory Visit: Payer: Self-pay

## 2018-04-16 ENCOUNTER — Encounter (INDEPENDENT_AMBULATORY_CARE_PROVIDER_SITE_OTHER): Payer: Self-pay | Admitting: Internal Medicine

## 2018-04-23 ENCOUNTER — Telehealth (INDEPENDENT_AMBULATORY_CARE_PROVIDER_SITE_OTHER): Payer: Self-pay | Admitting: Specialist

## 2018-04-23 NOTE — Telephone Encounter (Signed)
Please inform patient that  She can reschedule visit to next 3-4 weeks    use mychart for refill requests    Would encourage him to use mychart for questions .

## 2018-04-24 ENCOUNTER — Other Ambulatory Visit (INDEPENDENT_AMBULATORY_CARE_PROVIDER_SITE_OTHER): Payer: Self-pay | Admitting: Specialist

## 2018-04-24 ENCOUNTER — Other Ambulatory Visit (INDEPENDENT_AMBULATORY_CARE_PROVIDER_SITE_OTHER): Payer: Self-pay

## 2018-04-24 DIAGNOSIS — E669 Obesity, unspecified: Secondary | ICD-10-CM

## 2018-04-24 NOTE — Telephone Encounter (Signed)
I left a message for the patient with [employer] [phone].  I encouraged her to contact Dr. Rosie Fate regarding the need to reschedule tomorrow's appointment to next 3-4 weeks.  I encouraged her to use MyChart for refill requests and any questions.    Pending call back.      Lenda Kelp, RN Nurse Navigator [phone]

## 2018-04-24 NOTE — Progress Notes (Signed)
The patient has an appointment with Dr. Benjiman Core tomorrow, 04/25/18, at 1520.     Dr. Benjiman Core recommends rescheduling the appointment to next 3-4 weeks and to use MyChart for refill requests or questions.      Please refer Telephone documentation for further details.     Lenda Kelp, RN Nurse Navigator [phone]

## 2018-04-24 NOTE — Telephone Encounter (Signed)
Last visit 12/02, last labs 02/20, Follow Up 04/29.  Patient refill request for Novolin N and new meter for insurance to cover Accu-chek device and supplies to be sent to [facility].

## 2018-04-25 ENCOUNTER — Ambulatory Visit (INDEPENDENT_AMBULATORY_CARE_PROVIDER_SITE_OTHER): Payer: HMO | Admitting: Specialist

## 2018-04-25 MED ORDER — ACCU-CHEK SOFT TOUCH LANCETS MISC
2 refills | Status: DC
Start: 2018-04-25 — End: 2018-06-07

## 2018-04-25 MED ORDER — ACCU-CHEK AVIVA PLUS W/DEVICE KIT
PACK | 1 refills | Status: DC
Start: 2018-04-25 — End: 2018-06-07

## 2018-04-25 MED ORDER — INSULIN NPH (HUMAN) (ISOPHANE) 100 UNIT/ML SC SUSP
SUBCUTANEOUS | 0 refills | Status: DC
Start: 2018-04-25 — End: 2018-07-19

## 2018-04-25 MED ORDER — ACCU-CHEK AVIVA VI STRP
ORAL_STRIP | 2 refills | Status: DC
Start: 2018-04-25 — End: 2018-06-07

## 2018-06-06 ENCOUNTER — Ambulatory Visit (INDEPENDENT_AMBULATORY_CARE_PROVIDER_SITE_OTHER): Payer: HMO | Admitting: Specialist

## 2018-06-07 ENCOUNTER — Other Ambulatory Visit (INDEPENDENT_AMBULATORY_CARE_PROVIDER_SITE_OTHER): Payer: Self-pay

## 2018-06-07 NOTE — Telephone Encounter (Signed)
Patient needs refills for the pending orders.  Patient has scheduled video appointment on June 4th, 2020.

## 2018-06-10 ENCOUNTER — Other Ambulatory Visit (INDEPENDENT_AMBULATORY_CARE_PROVIDER_SITE_OTHER): Payer: Self-pay | Admitting: Internal Medicine

## 2018-06-10 DIAGNOSIS — E669 Obesity, unspecified: Secondary | ICD-10-CM

## 2018-06-11 ENCOUNTER — Telehealth (INDEPENDENT_AMBULATORY_CARE_PROVIDER_SITE_OTHER): Payer: Self-pay | Admitting: Internal Medicine

## 2018-06-11 ENCOUNTER — Other Ambulatory Visit (INDEPENDENT_AMBULATORY_CARE_PROVIDER_SITE_OTHER): Payer: Self-pay | Admitting: Internal Medicine

## 2018-06-11 DIAGNOSIS — E669 Obesity, unspecified: Secondary | ICD-10-CM

## 2018-06-11 MED ORDER — ACCU-CHEK SOFT TOUCH LANCETS MISC
2 refills | Status: AC
Start: 2018-06-11 — End: 2019-06-06

## 2018-06-11 MED ORDER — INSULIN SYRINGE-NEEDLE U-100 31G X 5/16" 1 ML MISC
0 refills | Status: DC
Start: 2018-06-11 — End: 2018-07-19

## 2018-06-11 MED ORDER — ACCU-CHEK AVIVA PLUS W/DEVICE KIT: PACK | 1 refills | Status: DC

## 2018-06-11 MED ORDER — ACCU-CHEK AVIVA VI STRP
ORAL_STRIP | 2 refills | Status: DC
Start: 2018-06-11 — End: 2018-07-19

## 2018-06-11 NOTE — Telephone Encounter (Signed)
Name, strength, directions of requested refill(s):  BD INSULIN SYRINGE ULTRAFINE 31G X 5/16" 1 ML Misc (Order [id number])         Pharmacy to send refill to or patient to pick up rx from office (mark requested pharmacy in [place]):      [facility] [address] [place] - [address]  [address]  [place] [address]  Phone: [phone] Fax: [phone]    [facility] - [place], [place] - [address] AT Portal to [facility]  [address] [place] [address]  Phone: [phone] Fax: [phone]        Please mark "X" next to the preferred call back number:    Mobile:   Telephone Information:   Mobile [phone]       Home: @HOMEPHONE @    Work: @WORKPHONE @          Next visit: 07/12/2018

## 2018-06-11 NOTE — Telephone Encounter (Signed)
Rx sent to pharmacy   

## 2018-07-12 ENCOUNTER — Telehealth (INDEPENDENT_AMBULATORY_CARE_PROVIDER_SITE_OTHER): Payer: HMO | Admitting: Internal Medicine

## 2018-07-19 ENCOUNTER — Ambulatory Visit (INDEPENDENT_AMBULATORY_CARE_PROVIDER_SITE_OTHER): Payer: HMO | Admitting: Internal Medicine

## 2018-07-19 ENCOUNTER — Encounter (INDEPENDENT_AMBULATORY_CARE_PROVIDER_SITE_OTHER): Payer: Self-pay | Admitting: Internal Medicine

## 2018-07-19 VITALS — BP 132/83 | HR 71 | Temp 97.4°F | Ht 65.35 in | Wt 189.0 lb

## 2018-07-19 DIAGNOSIS — E1169 Type 2 diabetes mellitus with other specified complication: Secondary | ICD-10-CM

## 2018-07-19 DIAGNOSIS — E669 Obesity, unspecified: Secondary | ICD-10-CM

## 2018-07-19 DIAGNOSIS — E782 Mixed hyperlipidemia: Secondary | ICD-10-CM

## 2018-07-19 DIAGNOSIS — I10 Essential (primary) hypertension: Secondary | ICD-10-CM

## 2018-07-19 DIAGNOSIS — Z1211 Encounter for screening for malignant neoplasm of colon: Secondary | ICD-10-CM

## 2018-07-19 DIAGNOSIS — Z1239 Encounter for other screening for malignant neoplasm of breast: Secondary | ICD-10-CM

## 2018-07-19 DIAGNOSIS — Z0000 Encounter for general adult medical examination without abnormal findings: Secondary | ICD-10-CM

## 2018-07-19 MED ORDER — ACCU-CHEK AVIVA VI STRP
ORAL_STRIP | 2 refills | Status: DC
Start: 2018-07-19 — End: 2018-10-22

## 2018-07-19 MED ORDER — INSULIN SYRINGE-NEEDLE U-100 31G X 5/16" 1 ML MISC
0 refills | Status: DC
Start: 2018-07-19 — End: 2018-12-28

## 2018-07-19 MED ORDER — ATORVASTATIN CALCIUM 20 MG PO TABS
20.0000 mg | ORAL_TABLET | Freq: Every day | ORAL | 1 refills | Status: DC
Start: 2018-07-19 — End: 2018-10-22

## 2018-07-19 MED ORDER — NOVOLIN N 100 UNIT/ML SC SUSP
SUBCUTANEOUS | 2 refills | Status: DC
Start: 2018-07-19 — End: 2018-10-22

## 2018-07-19 MED ORDER — METFORMIN HCL 1000 MG PO TABS
1000.0000 mg | ORAL_TABLET | Freq: Two times a day (BID) | ORAL | 1 refills | Status: DC
Start: 2018-07-19 — End: 2018-10-22

## 2018-07-19 MED ORDER — LISINOPRIL 40 MG PO TABS
40.0000 mg | ORAL_TABLET | Freq: Every day | ORAL | 1 refills | Status: DC
Start: 2018-07-19 — End: 2018-10-22

## 2018-07-19 NOTE — Progress Notes (Signed)
Subjective:      Patient ID: [name] is a 63 y.o. female.    Chief Complaint:  Chief Complaint   Patient presents with   . Annual Exam     Fasting       HPI  Patient is a49 year old Hispanic female with history of diabetes mellitus 2now followed by endocrinologist Dr. Benjiman Core, hypertension, hyperlipidemia, afib followed by cardiologist Dr. Fransisco Beau, among others, who is here today for annual wellness exam.     Has not had any ED visits or hospital admissions over the last year.    Last eye exam: 04/2017; she is aware she is due    Since her last visit w/ me in 03/2018, she has lost 5 lbs.     DM2 - Has been followed by endocrinologist Dr. Benjiman Core, however she reports she may no longer be able to see her due to cost of the visits as the patient just started working.  Reports compliance w/ insulin as below. States she has been checking glucose but does not bring in glucometer.     HTN - Reports compliance w/ Norvasc5mg  daily and lisinopril 40mg  daily. Patient denies any frequent headaches, chest pain, shortness of breath, orthopnea or PND.Admits occasional palpitations.Admits she has not been checking her BP regularly.     Hyperlipidemia - Reports compliance w/ Lipitor 20mg  daily. Last lipid panel done 12/2017 showed good control. Denies any myalgias or abdominal pain.     Health maintenance:  Breast cancer screening - last mammogram was done 2018; requesting order for new mammogram  Cervical cancer screening - last pap smear was done 12/2014; patient aware she is due for an appointment with gynecology  Colon cancer screening - has never had colonoscopy; would like referral  Shingles vaccine - Will consider  Pneumococcal vaccine - has not had; will consider  Tetanus vaccine - Tdap 07/29/2013  Flu vaccine - 12/2017      Immunization History   Administered Date(s) Administered   . Influenza (Im) Preserved TRIVALENT VACCINE 01/23/2012   . Influenza Vacc QUAD Recombinant PF 35yrs & up 12/25/2017   . Influenza  quadrivalent (IM) 3 Yrs & greater 12/28/2012, 12/18/2014   . Influenza quadrivalent (IM) PF 3 Yrs & greater 10/31/2013   . Tdap 07/29/2013       Problem List:  Patient Active Problem List   Diagnosis   . Glaucoma   . Diabetes mellitus type 2 in obese   . Hypertension   . Dyslipidemia   . Obesity   . Elevated liver enzymes   . Fatty liver   . Diabetic retinopathy   . Non-rheumatic mitral regurgitation   . Venous insufficiency   . Cystocele   . Hyperkalemia       Current Medications:  Current Outpatient Medications   Medication Sig Dispense Refill   . ACCU-CHEK AVIVA test strip Check blood glucose level twice daily. ICD 10 Code E11.9, Z79.4 200 each 2   . amLODIPine (NORVASC) 5 MG tablet Take 5 mg by mouth daily         . apixaban (ELIQUIS) 5 MG Take 5 mg by mouth every 12 (twelve) hours.     Marland Kitchen atorvastatin (LIPITOR) 20 MG tablet Take 1 tablet (20 mg total) by mouth daily 90 tablet 1   . Blood Glucose Monitoring Suppl (ACCU-CHEK AVIVA PLUS) w/Device Kit Check blood glucose level twice daily. ICD 10 Code E11.9, Z79.4 1 kit 1   . Calcium Carbonate-Vitamin D (CALCIUM-D PO) Take 1 tablet  by mouth daily.     . insulin NPH (NOVOLIN N) 100 UNIT/ML injection INJECT 50 UNITS SUBCUTANEOUSLY WITH BREAKFST AND 24 UNITS WITH DINNER 9 mL 0   . Insulin Syringe-Needle U-100 (BD INSULIN SYRINGE ULTRAFINE) 31G X 5/16" 1 ML Misc BD Insulin Syringe Ultra-Fine 1 mL 31 gauge x 5/16"     . Insulin Syringe-Needle U-100 (BD INSULIN SYRINGE ULTRAFINE) 31G X 5/16" 1 ML Misc ADMINISTER INSULIN SUBCUATANEOUSLY TWICE A DAY 100 each 0   . Lancets (ACCU-CHEK SOFT TOUCH) lancets Check blood glucose level twice daily. ICD 10 Code E11.9, Z79.4 200 each 2   . lisinopril (PRINIVIL,ZESTRIL) 40 MG tablet Take 1 tablet (40 mg total) by mouth daily 90 tablet 1   . metFORMIN (GLUCOPHAGE) 1000 MG tablet Take 1 tablet (1,000 mg total) by mouth 2 (two) times daily with meals 180 tablet 1   . metoprolol succinate XL (TOPROL-XL) 50 MG 24 hr tablet Take 1 tablet by  mouth every evening     . TRAVATAN Z 0.004 % Solution ophthalmic solution        No current facility-administered medications for this visit.        Allergies:  Allergies   Allergen Reactions   . Shellfish-Derived Products        Past Medical History:  Past Medical History:   Diagnosis Date   . Diabetes mellitus type 2 in obese    . Diabetic retinopathy 06/28/2013    Of the right eye, per patient   . Glaucoma 06/01/2012   . Hyperlipidemia    . Hypertension    . Obesity    . Venous insufficiency 09/17/2014    Followed at [facility].  Wears compression stockings.        Past Surgical History:  Past Surgical History:   Procedure Laterality Date   . BREAST CYST EXCISION  1993    bilateral   . TUBAL LIGATION         Family History:  Family History   Problem Relation Age of Onset   . Heart disease Mother    . Diabetes Mother    . Hypertension Mother    . Hyperlipidemia Mother    . Heart disease Father    . Diabetes Father    . Hypertension Father    . Hyperlipidemia Father    . Cirrhosis Father    . Liver cancer Maternal Aunt    . Cancer Maternal Uncle         liver   . Diabetes Sister        Social History:  Social History     Socioeconomic History   . Marital status: Single     Spouse name: Not on file   . Number of children: 3   . Years of education: Not on file   . Highest education level: Not on file   Occupational History   . Occupation: Maintenance     Employer: Civil engineer, contracting   Social Needs   . Financial resource strain: Not on file   . Food insecurity     Worry: Not on file     Inability: Not on file   . Transportation needs     Medical: Not on file     Non-medical: Not on file   Tobacco Use   . Smoking status: Never Smoker   . Smokeless tobacco: Never Used   Substance and Sexual Activity   . Alcohol use: No   . Drug use: No   .  Sexual activity: Not Currently   Lifestyle   . Physical activity     Days per week: Not on file     Minutes per session: Not on file   . Stress: Not on file   Relationships   .  Social Wellsite geologist on phone: Not on file     Gets together: Not on file     Attends religious service: Not on file     Active member of club or organization: Not on file     Attends meetings of clubs or organizations: Not on file     Relationship status: Not on file   . Intimate partner violence     Fear of current or ex partner: Not on file     Emotionally abused: Not on file     Physically abused: Not on file     Forced sexual activity: Not on file   Other Topics Concern   . Not on file   Social History Narrative    Originally from [place].  Lives with her daughter.    Exercise: walks frequently       The following sections were reviewed this encounter by the provider:   Tobacco  Allergies  Meds  Problems  Med Hx  Surg Hx  Fam Hx         ROS:  Review of Systems   Constitutional: Negative for chills, fatigue, fever and unexpected weight change.   HENT: Negative for hearing loss.    Eyes: Negative for visual disturbance.   Respiratory: Negative for cough, chest tightness and shortness of breath.    Cardiovascular: Negative for chest pain, palpitations and leg swelling.   Gastrointestinal: Negative for abdominal pain, blood in stool, constipation, diarrhea, nausea and vomiting.   Genitourinary: Negative for difficulty urinating, dysuria, frequency, hematuria, pelvic pain and abnormal vaginal discharge.   Musculoskeletal: Negative for arthralgias and myalgias.   Skin: Negative for rash.   Neurological: Negative for dizziness, weakness, light-headedness and headaches.   Hematological: Does not bruise/bleed easily.   Psychiatric/Behavioral: Negative for dysphoric mood. The patient is not nervous/anxious.         Objective:   Vitals:  BP 132/83 (BP Site: Left arm, Patient Position: Sitting, Cuff Size: Medium)   Pulse 71   Temp 97.4 F (36.3 C) (Oral)   Ht 1.66 m (5' 5.35")   Wt 85.7 kg (189 lb)   BMI 31.11 kg/m     Physical Exam:  Physical Exam   Constitutional: She is oriented to person,  place, and time. She appears well-developed. No distress.   HENT:   Head: Normocephalic.   Right Ear: No tenderness. Tympanic membrane is not erythematous and not bulging.   Left Ear: No tenderness. Tympanic membrane is not erythematous and not bulging.   Mouth/Throat: Oropharynx is clear and moist. No oropharyngeal exudate.   Eyes: Pupils are equal, round, and reactive to light. Conjunctivae are normal. Right eye exhibits no discharge. Left eye exhibits no discharge. No scleral icterus.   Neck: Neck supple. No thyromegaly present.   Cardiovascular: Normal rate, regular rhythm, normal heart sounds and intact distal pulses.  Exam reveals no gallop and no friction rub.    No murmur heard.  Pulmonary/Chest: Effort normal and breath sounds normal. No respiratory distress. She has no wheezes. She has no rales.   Abdominal: Soft. Bowel sounds are normal. She exhibits no mass. There is no tenderness. There is no rebound and no guarding.  Genitourinary:   Genitourinary Comments: GU exam deferred to patient's gynecologist  Musculoskeletal: Normal range of motion. She exhibits no edema.   Lymphadenopathy:     He has no cervical adenopathy.   Neurological: She is alert and oriented to person, place, and time. She has normal strength. No cranial nerve deficit. Gait normal.   Skin: Skin is warm and dry. No rash noted. She is not diaphoretic.   Psychiatric: She has a normal mood and affect. Her behavior is normal.     Lab Results   Component Value Date    HGBA1C 7.3 (H) 03/29/2018     Lab Results   Component Value Date    WBC 7.7 03/29/2018    HGB 12.3 03/29/2018    HCT 35.7 03/29/2018    MCV 88 03/29/2018    PLT 236 03/29/2018     '  Chemistry        Component Value Date/Time    NA 140 03/29/2018 0000    K 4.6 03/29/2018 0000    CL 101 03/29/2018 0000    CL 104 02/15/2014 0733    CO2 23 03/29/2018 0000    BUN 10 03/29/2018 0000    CREAT 0.69 03/29/2018 0000    CREAT 0.78 02/15/2014 0733    GLU 72 03/29/2018 0000    GLU 364 (H)  10/27/2017 0013        Component Value Date/Time    CA 8.9 03/29/2018 0000    ALKPHOS 89 03/29/2018 0000    AST 32 03/29/2018 0000    ALT 18 03/29/2018 0000    BILITOTAL 0.3 03/29/2018 0000          Lab Results   Component Value Date    CHOL 123 03/29/2018    CHOL 168 12/25/2017    CHOL 131 09/11/2017     Lab Results   Component Value Date    HDL 67 03/29/2018    HDL 57 12/25/2017    HDL 55 09/11/2017     Lab Results   Component Value Date    LDL 42 03/29/2018    LDL 90 12/25/2017    LDL 57 09/11/2017     Lab Results   Component Value Date    TRIG 70 03/29/2018    TRIG 107 12/25/2017    TRIG 95 09/11/2017     Lab Results   Component Value Date    TSH 1.290 03/29/2018    TSH 1.17 06/28/2013     No results found for: VITD, 25HYDROXYDTO  No results found for: B12       Assessment:     1. Annual physical exam  - CBC and differential  - Comprehensive metabolic panel  - TSH, Abn Reflex to Free T4, Serum  - Lipid panel    2. Breast cancer screening  - Mammo Digital Screening Bilateral W Cad; Future    3. Colon cancer screening  - Ambulatory referral to Gastroenterology    4. Diabetes mellitus type 2 in obese  - metFORMIN (GLUCOPHAGE) 1000 MG tablet; Take 1 tablet (1,000 mg total) by mouth 2 (two) times daily with meals  Dispense: 180 tablet; Refill: 1  - insulin NPH (NovoLIN N) 100 UNIT/ML injection; INJECT 50 UNITS SUBCUTANEOUSLY WITH BREAKFST AND 24 UNITS WITH DINNER  Dispense: 30 mL; Refill: 2  - Accu-Chek Aviva test strip; Check blood glucose level twice daily. ICD 10 Code E11.9, Z79.4  Dispense: 200 each; Refill: 2  - Hemoglobin A1C  - Urine Microalbumin Random  -  Insulin Syringe-Needle U-100 (BD INSULIN SYRINGE ULTRAFINE) 31G X 5/16" 1 ML Misc; ADMINISTER INSULIN SUBCUATANEOUSLY TWICE A DAY  Dispense: 100 each; Refill: 0    5. Essential hypertension  - lisinopril (ZESTRIL) 40 MG tablet; Take 1 tablet (40 mg total) by mouth daily  Dispense: 90 tablet; Refill: 1    6. Mixed hyperlipidemia  - atorvastatin (LIPITOR) 20  MG tablet; Take 1 tablet (20 mg total) by mouth daily  Dispense: 90 tablet; Refill: 1      Plan:     I provided following Counseling/Anticipatory Guidance:    Importance of proper nutrition and maintaining healthy weight.    Increase vegetables, fruits, lentils, beans and fiber in your diet.    BMI goal; <30. Blood Pressure goal: <130/62mmHg     Importance of aerobic/strengthening Exercises and prevention of injuries.    Exercise at least 30 minutes 5 days per week.   Always wear your seatbelt in the car.   Wear sunscreen that is broad-spectrum (UVA/UVB) and at least SPF 30.    Importance of avoiding misuse of Tobacco, Alcohol, and mood altering drugs.    Avoid drinking reduced to less than 2-3 per week    Importance of proper dental and mental health.   Schedule dental exam and cleaning every 6 months   Have your vision checked every 1-2 years.    Importance of proper Immunization as recommended by ACIP.     Get a flu vaccine yearly in the fall.   Td or Tdap vaccine every 10 years.      If age> 65   Consider Prevnar, Pneumovaxand Zostavax     Importance of proper Screening tests as recommended by USPSTF.   Colonoscopy test: at least every 10 years starting at age 78, sooner for certain at-risk groups.     Patient will continue follow-up with gynecology for routine breast and pelvic exams together with pertinent screenings.      Suggest : Annual preventative visit. This is different and separate from any problem-related visit.     Order for screening mammogram given.     Referral to GI for screening colonoscopy given.     DM2:  - suboptimally controlled on last A1C done 03/2018  - repeat A1C  - encouraged lifestyle including dietary changes  - For now, continue current medications    HTN:  - Fairly well-controlled with blood pressure close to goal of under 130/58mmHg  - Continue current medications  - Encourage low sodium diet and routine exercise as  tolerated    Hyperlipidemia:  - Well-controlled on last lipid panel that was last done  - Continue current statin medication  - Encourage heart healthy diet such as the mediterranean diet together with aerobic exercise for 30-45 minutes 4-5 days a week as tolerated    Patient to continue f/u with cardiologist Dr. Fransisco Beau for management of afib     Return in about 3 months (around 10/19/2018) for f/u diabetes, f/u hypertension, f/u hyperlipidemia.    Standley Dakins, MD

## 2018-07-19 NOTE — Progress Notes (Signed)
Have you seen any specialists/other providers since your last visit with [place]?      Yes Cardiologist    Arm preference verified?   Yes    The patient is due for eye exam, foot exam, pap smear, shingles vaccine and Advanced Directive on File, [facility] care plan letter, Urine Microalbumin

## 2018-07-20 LAB — COMPREHENSIVE METABOLIC PANEL
ALT: 25 IU/L (ref 0–32)
AST (SGOT): 27 IU/L (ref 0–40)
Albumin/Globulin Ratio: 1.6 (ref 1.2–2.2)
Albumin: 4.5 g/dL (ref 3.8–4.8)
Alkaline Phosphatase: 94 IU/L (ref 39–117)
BUN / Creatinine Ratio: 30 — ABNORMAL HIGH (ref 12–28)
BUN: 25 mg/dL (ref 8–27)
Bilirubin, Total: 0.6 mg/dL (ref 0.0–1.2)
CO2: 24 mmol/L (ref 20–29)
Calcium: 9.7 mg/dL (ref 8.7–10.3)
Chloride: 104 mmol/L (ref 96–106)
Creatinine: 0.84 mg/dL (ref 0.57–1.00)
EGFR: 75 mL/min/{1.73_m2} (ref >59)
EGFR: 86 mL/min/{1.73_m2} (ref >59)
Globulin, Total: 2.9 g/dL (ref 1.5–4.5)
Glucose: 105 mg/dL — ABNORMAL HIGH (ref 65–99)
Potassium: 4.8 mmol/L (ref 3.5–5.2)
Protein, Total: 7.4 g/dL (ref 6.0–8.5)
Sodium: 143 mmol/L (ref 134–144)

## 2018-07-20 LAB — CBC AND DIFFERENTIAL
Baso(Absolute): 0 10*3/uL (ref 0.0–0.2)
Basos: 0 %
Eos: 1 %
Eosinophils Absolute: 0.1 10*3/uL (ref 0.0–0.4)
Hematocrit: 41.7 % (ref 34.0–46.6)
Hemoglobin: 13.7 g/dL (ref 11.1–15.9)
Immature Granulocytes Absolute: 0 10*3/uL (ref 0.0–0.1)
Immature Granulocytes: 0 %
Lymphocytes Absolute: 2.2 10*3/uL (ref 0.7–3.1)
Lymphocytes: 27 %
MCH: 29.3 pg (ref 26.6–33.0)
MCHC: 32.9 g/dL (ref 31.5–35.7)
MCV: 89 fL (ref 79–97)
Monocytes Absolute: 0.8 10*3/uL (ref 0.1–0.9)
Monocytes: 9 %
Neutrophils Absolute: 5.2 10*3/uL (ref 1.4–7.0)
Neutrophils: 63 %
Platelets: 253 10*3/uL (ref 150–450)
RBC: 4.67 x10E6/uL (ref 3.77–5.28)
RDW: 14.2 % (ref 11.7–15.4)
WBC: 8.3 10*3/uL (ref 3.4–10.8)

## 2018-07-20 LAB — THYROID STIMULATING HORMONE (TSH), REFLEX ON ABNORMAL TO FREE T4, SERUM: TSH: 0.874 u[IU]/mL (ref 0.450–4.500)

## 2018-07-20 LAB — LIPID PANEL
Cholesterol / HDL Ratio: 2.5 ratio (ref 0.0–4.4)
Cholesterol: 175 mg/dL (ref 100–199)
HDL: 70 mg/dL (ref >39)
LDL Calculated: 89 mg/dL (ref 0–99)
Triglycerides: 81 mg/dL (ref 0–149)
VLDL Calculated: 16 mg/dL (ref 5–40)

## 2018-07-20 LAB — MICROALBUMIN, RANDOM URINE
Creatinine, UR: 134.5 mg/dL
Microalb/Crt. Ratio: 15 mg/g creat (ref 0–29)
Microalbumin, UR: 20.5 ug/mL

## 2018-07-20 LAB — HEMOGLOBIN A1C: Hemoglobin A1C: 8.5 % — ABNORMAL HIGH (ref 4.8–5.6)

## 2018-07-24 ENCOUNTER — Encounter (INDEPENDENT_AMBULATORY_CARE_PROVIDER_SITE_OTHER): Payer: Self-pay | Admitting: Internal Medicine

## 2018-07-25 ENCOUNTER — Encounter (INDEPENDENT_AMBULATORY_CARE_PROVIDER_SITE_OTHER): Payer: Self-pay | Admitting: Internal Medicine

## 2018-07-26 ENCOUNTER — Encounter (INDEPENDENT_AMBULATORY_CARE_PROVIDER_SITE_OTHER): Payer: Self-pay

## 2018-07-31 ENCOUNTER — Ambulatory Visit
Admission: RE | Admit: 2018-07-31 | Discharge: 2018-07-31 | Disposition: A | Discharge status: Home / Self Care | Payer: HMO | Source: Ambulatory Visit | Attending: Cardiovascular Disease | Admitting: Cardiovascular Disease

## 2018-07-31 DIAGNOSIS — I2721 Secondary pulmonary arterial hypertension: Secondary | ICD-10-CM | POA: Insufficient documentation

## 2018-07-31 DIAGNOSIS — R0609 Other forms of dyspnea: Secondary | ICD-10-CM | POA: Insufficient documentation

## 2018-07-31 NOTE — Progress Notes (Signed)
07/31/18 1500   Spirometry Pre and/or Post   Procedure Location and Type Diagnostic - Pre Only   Pre FVC 2.53 L   Pre FEV1 2.04 L   Pre FEV1/FVC % (Calculated) 81   Patient follows commands Yes   Cough during exhalation No   Early termination No   Leak No   Variable effort No   2 FVC 150 ml of each other Yes   2 FEV1 150 ml of each other Yes   3 maneuvers performed Yes   Patient Effort Good   Adverse Reactions None   Lung Volumes   Method Nitrogen wash out   Procedure status Completed   DLCO   Procedure status Completed   Primary Charges   $ Diffusing Capacity Done? Yes   $ Plethysmography Airway Resistance Performed? Yes   $ Spirometry Type Performed Simple - CPT 94010   Performing Departments   Diffusing Capacity Performing department formula [id number]   PLAB Pleth performing department formula [id number]

## 2018-08-27 ENCOUNTER — Telehealth (INDEPENDENT_AMBULATORY_CARE_PROVIDER_SITE_OTHER): Payer: Self-pay | Admitting: Internal Medicine

## 2018-08-27 NOTE — Telephone Encounter (Signed)
Name, strength, directions of requested refill(s):  amLODIPine (NORVASC) 5 MG tablet    Pharmacy to send refill to or patient to pick up rx from office (mark requested pharmacy in [facility]):      [facility] [address] [place] - [address]  [address]  [place] [address]  Phone: [phone] Fax: [phone]    [facility] - [place], [place] - [address] AT Portal to Registered [facility] Sites  [address] [place] [address]  Phone: [phone] Fax: [phone]        Please mark "X" next to the preferred call back number:    Mobile:   Telephone Information:   Mobile [phone]       Home: @HOMEPHONE @    Work: @WORKPHONE @          Next visit: 10/22/2018

## 2018-08-28 NOTE — Telephone Encounter (Signed)
Rx for amlodipine has not been written by me and is written by her cardiologist, Dr. Carmina Miller. Please notify patient.  Thanks.

## 2018-08-29 NOTE — Telephone Encounter (Signed)
Left a voicemail message for the pt. With the information.

## 2018-09-10 ENCOUNTER — Encounter (INDEPENDENT_AMBULATORY_CARE_PROVIDER_SITE_OTHER): Payer: Self-pay

## 2018-09-13 ENCOUNTER — Ambulatory Visit (INDEPENDENT_AMBULATORY_CARE_PROVIDER_SITE_OTHER): Payer: HMO | Admitting: Specialist

## 2018-09-18 ENCOUNTER — Encounter (INDEPENDENT_AMBULATORY_CARE_PROVIDER_SITE_OTHER): Payer: HMO | Admitting: Internal Medicine

## 2018-10-22 ENCOUNTER — Encounter (INDEPENDENT_AMBULATORY_CARE_PROVIDER_SITE_OTHER): Payer: Self-pay | Admitting: Internal Medicine

## 2018-10-22 ENCOUNTER — Ambulatory Visit (FREE_STANDING_LABORATORY_FACILITY): Payer: HMO | Admitting: Internal Medicine

## 2018-10-22 VITALS — BP 150/88 | HR 81 | Temp 97.8°F | Resp 20 | Ht 65.0 in | Wt 185.0 lb

## 2018-10-22 DIAGNOSIS — E1169 Type 2 diabetes mellitus with other specified complication: Secondary | ICD-10-CM

## 2018-10-22 DIAGNOSIS — I10 Essential (primary) hypertension: Secondary | ICD-10-CM

## 2018-10-22 DIAGNOSIS — Z23 Encounter for immunization: Secondary | ICD-10-CM

## 2018-10-22 DIAGNOSIS — E782 Mixed hyperlipidemia: Secondary | ICD-10-CM

## 2018-10-22 DIAGNOSIS — E669 Obesity, unspecified: Secondary | ICD-10-CM

## 2018-10-22 LAB — HEMOGLOBIN A1C
Average Estimated Glucose: 171.4 mg/dL
Hemoglobin A1C: 7.6 % — ABNORMAL HIGH (ref 4.6–5.9)

## 2018-10-22 LAB — COMPREHENSIVE METABOLIC PANEL
ALT: 23 U/L (ref 0–55)
AST (SGOT): 18 U/L (ref 5–34)
Albumin/Globulin Ratio: 1.2 (ref 0.9–2.2)
Albumin: 4.1 g/dL (ref 3.5–5.0)
Alkaline Phosphatase: 98 U/L (ref 37–106)
Anion Gap: 11 (ref 5.0–15.0)
BUN: 18 mg/dL (ref 7.0–19.0)
Bilirubin, Total: 0.5 mg/dL (ref 0.2–1.2)
CO2: 27 mEq/L (ref 21–29)
Calcium: 9.3 mg/dL (ref 8.5–10.5)
Chloride: 103 mEq/L (ref 100–111)
Creatinine: 1 mg/dL (ref 0.4–1.5)
Globulin: 3.3 g/dL (ref 2.0–3.7)
Glucose: 204 mg/dL — ABNORMAL HIGH (ref 70–100)
Potassium: 4.4 mEq/L (ref 3.5–5.1)
Protein, Total: 7.4 g/dL (ref 6.0–8.3)
Sodium: 141 mEq/L (ref 136–145)

## 2018-10-22 LAB — HEMOLYSIS INDEX: Hemolysis Index: 2 (ref 0–18)

## 2018-10-22 LAB — GFR: EGFR: 56

## 2018-10-22 MED ORDER — ATORVASTATIN CALCIUM 20 MG PO TABS
20.0000 mg | ORAL_TABLET | Freq: Every day | ORAL | 1 refills | Status: DC
Start: 2018-10-22 — End: 2019-01-21

## 2018-10-22 MED ORDER — GLUCOSE BLOOD VI STRP
ORAL_STRIP | 2 refills | Status: DC
Start: 2018-10-22 — End: 2020-03-23

## 2018-10-22 MED ORDER — NOVOLIN N 100 UNIT/ML SC SUSP
SUBCUTANEOUS | 3 refills | Status: DC
Start: 2018-10-22 — End: 2019-01-21

## 2018-10-22 MED ORDER — LISINOPRIL 40 MG PO TABS
40.0000 mg | ORAL_TABLET | Freq: Every day | ORAL | 1 refills | Status: DC
Start: 2018-10-22 — End: 2019-01-21

## 2018-10-22 MED ORDER — METFORMIN HCL 1000 MG PO TABS
1000.0000 mg | ORAL_TABLET | Freq: Two times a day (BID) | ORAL | 1 refills | Status: DC
Start: 2018-10-22 — End: 2019-01-21

## 2018-10-22 NOTE — Progress Notes (Signed)
Have you seen any specialists/other providers since your last visit with [place]?    No    Arm preference verified?   Yes    The patient is due for foot exam, pap smear, influenza vaccine and shingles vaccine, [facility] letter, advance directive

## 2018-10-22 NOTE — Progress Notes (Signed)
Subjective:      Patient ID: [name] is a 63 y.o. female     Chief Complaint   Patient presents with   . Diabetes Follow-up   . Hypertension   . Hyperlipidemia   . Flu Vaccine        HPI   Patient is 63 year old Hispanic female with history of diabetes mellitus 2now followed by endocrinologist Dr. Benjiman Core, hypertension, hyperlipidemia, afib followed by cardiologist Dr. Fransisco Beau, among others, who is here today for the above.     Since her last visit in June 2020, she has lost 4 pounds.    DM2 - As per prior visit, she is no longer seeing endocrinologist Dr. Marcellina Millin due to cost of visits.  Reports compliance w/ insulin as below as well as with metformin, however she reports she ran out of insulin about 2 days ago and her glucose reading this morning was 235. She does not bring in her glucometer today but states she has been checking her glucose 2 times a day.  Reports fasting glucose has been in the 80s to 120s and bedtime glucose readings have been in the 130s to 160s.  Denies any polydipsia, polyuria or paresthesias.    HTN -Reports compliance w/ Norvasc5mg  daily and lisinopril 40mg  daily. Patient denies any frequent headaches, chest pain, shortness of breath, orthopnea or PND.Admits occasional palpitations.Admits she has not been checking her BP regularly.    Hyperlipidemia - Reports compliance w/ Lipitor 20mg  daily. Last lipid panel done 07/2018 showedgood control. Denies any myalgias or abdominal pain.     The following sections were reviewed this encounter by the provider:   Tobacco  Allergies  Meds  Problems  Med Hx  Surg Hx  Fam Hx         Past Medical History:   Diagnosis Date   . Diabetes mellitus type 2 in obese    . Diabetic retinopathy 06/28/2013    Of the right eye, per patient   . Glaucoma 06/01/2012   . Hyperlipidemia    . Hypertension    . Obesity    . Venous insufficiency 09/17/2014    Followed at [facility].  Wears compression stockings.        Social History      Socioeconomic History   . Marital status: Single     Spouse name: Not on file   . Number of children: 3   . Years of education: Not on file   . Highest education level: Not on file   Occupational History   . Occupation: Maintenance     Employer: Civil engineer, contracting   Social Needs   . Financial resource strain: Not on file   . Food insecurity     Worry: Not on file     Inability: Not on file   . Transportation needs     Medical: Not on file     Non-medical: Not on file   Tobacco Use   . Smoking status: Never Smoker   . Smokeless tobacco: Never Used   Substance and Sexual Activity   . Alcohol use: No   . Drug use: No   . Sexual activity: Not Currently   Lifestyle   . Physical activity     Days per week: Not on file     Minutes per session: Not on file   . Stress: Not on file   Relationships   . Social connections     Talks on phone: Not on  file     Gets together: Not on file     Attends religious service: Not on file     Active member of club or organization: Not on file     Attends meetings of clubs or organizations: Not on file     Relationship status: Not on file   . Intimate partner violence     Fear of current or ex partner: Not on file     Emotionally abused: Not on file     Physically abused: Not on file     Forced sexual activity: Not on file   Other Topics Concern   . Not on file   Social History Narrative    Originally from [place].  Lives with her daughter.    Exercise: walks frequently       Allergies   Allergen Reactions   . Shellfish-Derived Products        Current Outpatient Medications   Medication Sig Dispense Refill   . Accu-Chek Aviva test strip Check blood glucose level twice daily. ICD 10 Code E11.9, Z79.4 200 each 2   . amLODIPine (NORVASC) 5 MG tablet Take 5 mg by mouth daily         . apixaban (ELIQUIS) 5 MG Take 5 mg by mouth every 12 (twelve) hours.     Marland Kitchen atorvastatin (LIPITOR) 20 MG tablet Take 1 tablet (20 mg total) by mouth daily 90 tablet 1   . Blood Glucose Monitoring Suppl (ACCU-CHEK AVIVA  PLUS) w/Device Kit Check blood glucose level twice daily. ICD 10 Code E11.9, Z79.4 1 kit 1   . Calcium Carbonate-Vitamin D (CALCIUM-D PO) Take 1 tablet by mouth daily.     . insulin NPH (NovoLIN N) 100 UNIT/ML injection INJECT 50 UNITS SUBCUTANEOUSLY WITH BREAKFST AND 24 UNITS WITH DINNER 30 mL 2   . Insulin Syringe-Needle U-100 (BD INSULIN SYRINGE ULTRAFINE) 31G X 5/16" 1 ML Misc BD Insulin Syringe Ultra-Fine 1 mL 31 gauge x 5/16"     . Insulin Syringe-Needle U-100 (BD INSULIN SYRINGE ULTRAFINE) 31G X 5/16" 1 ML Misc ADMINISTER INSULIN SUBCUATANEOUSLY TWICE A DAY 100 each 0   . Lancets (ACCU-CHEK SOFT TOUCH) lancets Check blood glucose level twice daily. ICD 10 Code E11.9, Z79.4 200 each 2   . lisinopril (ZESTRIL) 40 MG tablet Take 1 tablet (40 mg total) by mouth daily 90 tablet 1   . metFORMIN (GLUCOPHAGE) 1000 MG tablet Take 1 tablet (1,000 mg total) by mouth 2 (two) times daily with meals 180 tablet 1   . metoprolol succinate XL (TOPROL-XL) 50 MG 24 hr tablet Take 1 tablet by mouth every evening     . TRAVATAN Z 0.004 % Solution ophthalmic solution        No current facility-administered medications for this visit.        Review of Systems   Constitutional: Negative for fatigue.   Respiratory: Negative for chest tightness and shortness of breath.    Cardiovascular: Positive for palpitations (since ran out insulin). Negative for chest pain.   Gastrointestinal: Negative for abdominal pain, constipation, diarrhea, nausea and vomiting.   Endocrine: Negative for polydipsia and polyuria.   Musculoskeletal: Negative for myalgias.   Neurological: Negative for dizziness, light-headedness and headaches.      Objective:   BP 150/88 (BP Site: Left arm, Patient Position: Sitting, Cuff Size: Medium)   Pulse 81   Temp 97.8 F (36.6 C) (Oral)   Resp 20   Ht 1.651 m (5\' 5" )  Wt 83.9 kg (185 lb)   BMI 30.79 kg/m     Physical Exam  Vitals signs reviewed.   Constitutional:       General: She is not in acute distress.      Appearance: She is well-developed. She is not diaphoretic.   Eyes:      General: No scleral icterus.     Conjunctiva/sclera: Conjunctivae normal.   Cardiovascular:      Rate and Rhythm: Normal rate and regular rhythm.      Heart sounds: No murmur. No friction rub. No gallop.    Pulmonary:      Effort: Pulmonary effort is normal. No respiratory distress.      Breath sounds: No wheezing or rales.   Abdominal:      Palpations: Abdomen is soft. There is no mass.      Tenderness: There is no abdominal tenderness.   Skin:     General: Skin is warm and dry.   Neurological:      Mental Status: She is alert and oriented to person, place, and time.   Psychiatric:         Behavior: Behavior normal.         Lab Results   Component Value Date    HGBA1C 8.5 (H) 07/19/2018     Lab Results   Component Value Date    WBC 8.3 07/19/2018    HGB 13.7 07/19/2018    HCT 41.7 07/19/2018    MCV 89 07/19/2018    PLT 253 07/19/2018     '  Chemistry        Component Value Date/Time    NA 143 07/19/2018 0000    K 4.8 07/19/2018 0000    CL 104 07/19/2018 0000    CL 104 02/15/2014 0733    CO2 24 07/19/2018 0000    BUN 25 07/19/2018 0000    CREAT 0.84 07/19/2018 0000    CREAT 0.78 02/15/2014 0733    GLU 105 (H) 07/19/2018 0000    GLU 364 (H) 10/27/2017 0013        Component Value Date/Time    CA 9.7 07/19/2018 0000    ALKPHOS 94 07/19/2018 0000    AST 27 07/19/2018 0000    ALT 25 07/19/2018 0000    BILITOTAL 0.6 07/19/2018 0000          Lab Results   Component Value Date    CHOL 175 07/19/2018    CHOL 123 03/29/2018    CHOL 168 12/25/2017     Lab Results   Component Value Date    HDL 70 07/19/2018    HDL 67 03/29/2018    HDL 57 12/25/2017     Lab Results   Component Value Date    LDL 89 07/19/2018    LDL 42 03/29/2018    LDL 90 12/25/2017     Lab Results   Component Value Date    TRIG 81 07/19/2018    TRIG 70 03/29/2018    TRIG 107 12/25/2017     Lab Results   Component Value Date    TSH 0.874 07/19/2018    TSH 1.17 06/28/2013     No results found  for: VITD, 25HYDROXYDTO     Assessment:     1. Diabetes mellitus type 2 in obese  -Uncontrolled on last A1c, with goal of under 7%  -We will recheck an A1c to reevaluate  -Patient feels A1c may have been elevated due to stress.    -  Encourage efforts to follow a heart healthy diet such as a Mediterranean diet, monitor carbohydrate intake and also exercise regularly as tolerated.  -For now, continue current dose of metformin 1000 mg 2 times a day and NPH insulin 50 units in the morning and 24 units with dinner.  This may need to be adjusted - comprehensive metabolic panel  - Hemoglobin A1C  - metFORMIN (GLUCOPHAGE) 1000 MG tablet; Take 1 tablet (1,000 mg total) by mouth 2 (two) times daily with meals  Dispense: 180 tablet; Refill: 1  - insulin NPH (NovoLIN N) 100 UNIT/ML injection; INJECT 50 UNITS SUBCUTANEOUSLY WITH BREAKFST AND 24 UNITS WITH DINNER  Dispense: 30 mL; Refill: 3  - glucose blood (Accu-Chek Aviva) test strip; Check blood glucose level twice daily. ICD 10 Code E11.9, Z79.4  Dispense: 200 each; Refill: 2    2. Essential hypertension  -Blood pressure today is above goal of under 130/80 mmHg  -Strongly encourage patient to check blood pressures at home, with goal as above.  -If blood pressure readings are frequently above the goal at home, she will notify me or her cardiologist.  -For now continue current antihypertensive medications  - Comprehensive metabolic panel  - lisinopril (ZESTRIL) 40 MG tablet; Take 1 tablet (40 mg total) by mouth daily  Dispense: 90 tablet; Refill: 1    3. Mixed hyperlipidemia  - Well-controlled on last lipid panel that was last done  - Continue current statin medication  - Encourage heart healthy diet such as the mediterranean diet together with aerobic exercise for 30-45 minutes 4-5 days a week as tolerated  - Comprehensive metabolic panel  - atorvastatin (LIPITOR) 20 MG tablet; Take 1 tablet (20 mg total) by mouth daily  Dispense: 90 tablet; Refill: 1    4. Need for influenza  vaccination  - Flu vacc quad recombinant pres free 18 yrs& up        Plan:     Plan is as above.     She will continue follow-up with her cardiologist, Dr. Roda Shutters as planned by him.    Return in about 3 months (around 01/21/2019) for f/u diabetes.    Standley Dakins, MD

## 2018-11-14 ENCOUNTER — Encounter (INDEPENDENT_AMBULATORY_CARE_PROVIDER_SITE_OTHER): Payer: Self-pay | Admitting: Internal Medicine

## 2018-11-27 ENCOUNTER — Encounter (INDEPENDENT_AMBULATORY_CARE_PROVIDER_SITE_OTHER): Payer: Self-pay | Admitting: Internal Medicine

## 2018-12-04 ENCOUNTER — Ambulatory Visit (INDEPENDENT_AMBULATORY_CARE_PROVIDER_SITE_OTHER): Payer: HMO | Admitting: Internal Medicine

## 2018-12-04 ENCOUNTER — Encounter (INDEPENDENT_AMBULATORY_CARE_PROVIDER_SITE_OTHER): Payer: Self-pay | Admitting: Internal Medicine

## 2018-12-04 VITALS — BP 136/67 | HR 88 | Temp 97.8°F | Resp 18 | Ht 65.0 in | Wt 189.0 lb

## 2018-12-04 DIAGNOSIS — R0602 Shortness of breath: Secondary | ICD-10-CM

## 2018-12-04 DIAGNOSIS — R058 Other specified cough: Secondary | ICD-10-CM

## 2018-12-04 DIAGNOSIS — R5383 Other fatigue: Secondary | ICD-10-CM

## 2018-12-04 DIAGNOSIS — R05 Cough: Secondary | ICD-10-CM

## 2018-12-04 DIAGNOSIS — R109 Unspecified abdominal pain: Secondary | ICD-10-CM

## 2018-12-04 DIAGNOSIS — R5381 Other malaise: Secondary | ICD-10-CM

## 2018-12-04 DIAGNOSIS — R531 Weakness: Secondary | ICD-10-CM

## 2018-12-04 LAB — POCT URINALYSIS AUTOMATED (IAH)
Glucose, UA POCT: NEGATIVE
Ketones, UA POCT: NEGATIVE mg/dL
Nitrite, UA POCT: NEGATIVE
PH, UA POCT: 5.5 (ref 4.6–8)
Protein, UA POCT: NEGATIVE mg/dL
Specific Gravity, UA POCT: 1.02 mg/dL (ref 1.001–1.035)
Urine Leukocytes POCT: NEGATIVE
Urobilinogen, UA POCT: 0.2 mg/dL

## 2018-12-04 NOTE — Progress Notes (Signed)
Have you seen any specialists/other providers since your last visit with [place]?    Yes, Cardiologist  Arm preference verified?   Yes    The patient is due for foot exam, pap smear and shingles vaccine, [facility] letter, advacne directive

## 2018-12-04 NOTE — Progress Notes (Signed)
Subjective:      Patient ID: [name] is a 63 y.o. female     Chief Complaint   Patient presents with   . Right flank pain   . Fatigue   . Shortness of Breath        HPI   Patient is 63 year old Hispanic female with history of diabetes mellitus 2now followed by endocrinologist Dr. Benjiman Core, hypertension, hyperlipidemia, afib followed by cardiologist Dr. Fransisco Beau, among others, who is here todayfor the above.     Patient reports that 4 days ago she started w/ bilateral flank pain, now mostly on right flank.  Denies any urinary symptoms.     Also reports that for the last 4 weeks she has been having increased shortness of breath.  States she had an echo and stress test on 10/20 without new abnormalities (followed by cardiologist Dr. Fransisco Beau).  States HCTZ was stopped until tomorrow by Dr. Fransisco Beau.  She also reports Toprol was discontinued.      Has been having worsening balance problems for over 2 weeks. Admits occasional headaches.     Reports she has been checking glucose, states highest has been around 140s.     The following sections were reviewed this encounter by the provider:   Tobacco  Allergies  Meds  Problems  Med Hx  Surg Hx  Fam Hx         Past Medical History:   Diagnosis Date   . Diabetes mellitus type 2 in obese    . Diabetic retinopathy 06/28/2013    Of the right eye, per patient   . Glaucoma 06/01/2012   . Hyperlipidemia    . Hypertension    . Obesity    . Venous insufficiency 09/17/2014    Followed at [facility].  Wears compression stockings.        Social History     Socioeconomic History   . Marital status: Single     Spouse name: Not on file   . Number of children: 3   . Years of education: Not on file   . Highest education level: Not on file   Occupational History   . Occupation: Maintenance     Employer: Civil engineer, contracting   Social Needs   . Financial resource strain: Not on file   . Food insecurity     Worry: Not on file     Inability: Not on file   . Transportation needs     Medical: Not on  file     Non-medical: Not on file   Tobacco Use   . Smoking status: Never Smoker   . Smokeless tobacco: Never Used   Substance and Sexual Activity   . Alcohol use: No   . Drug use: No   . Sexual activity: Not Currently   Lifestyle   . Physical activity     Days per week: Not on file     Minutes per session: Not on file   . Stress: Not on file   Relationships   . Social Wellsite geologist on phone: Not on file     Gets together: Not on file     Attends religious service: Not on file     Active member of club or organization: Not on file     Attends meetings of clubs or organizations: Not on file     Relationship status: Not on file   . Intimate partner violence     Fear of  current or ex partner: Not on file     Emotionally abused: Not on file     Physically abused: Not on file     Forced sexual activity: Not on file   Other Topics Concern   . Not on file   Social History Narrative    Originally from [place].  Lives with her daughter.    Exercise: walks frequently       Allergies   Allergen Reactions   . Shellfish-Derived Products        Current Outpatient Medications   Medication Sig Dispense Refill   . amLODIPine (NORVASC) 5 MG tablet Take 5 mg by mouth daily         . apixaban (ELIQUIS) 5 MG Take 5 mg by mouth every 12 (twelve) hours.     Marland Kitchen atorvastatin (LIPITOR) 20 MG tablet Take 1 tablet (20 mg total) by mouth daily 90 tablet 1   . Blood Glucose Monitoring Suppl (ACCU-CHEK AVIVA PLUS) w/Device Kit Check blood glucose level twice daily. ICD 10 Code E11.9, Z79.4 1 kit 1   . Calcium Carbonate-Vitamin D (CALCIUM-D PO) Take 1 tablet by mouth daily.     Marland Kitchen glucose blood (Accu-Chek Aviva) test strip Check blood glucose level twice daily. ICD 10 Code E11.9, Z79.4 200 each 2   . insulin NPH (NovoLIN N) 100 UNIT/ML injection INJECT 50 UNITS SUBCUTANEOUSLY WITH BREAKFST AND 24 UNITS WITH DINNER 30 mL 3   . Insulin Syringe-Needle U-100 (BD INSULIN SYRINGE ULTRAFINE) 31G X 5/16" 1 ML Misc BD Insulin Syringe Ultra-Fine 1  mL 31 gauge x 5/16"     . Insulin Syringe-Needle U-100 (BD INSULIN SYRINGE ULTRAFINE) 31G X 5/16" 1 ML Misc ADMINISTER INSULIN SUBCUATANEOUSLY TWICE A DAY 100 each 0   . Lancets (ACCU-CHEK SOFT TOUCH) lancets Check blood glucose level twice daily. ICD 10 Code E11.9, Z79.4 200 each 2   . lisinopril (ZESTRIL) 40 MG tablet Take 1 tablet (40 mg total) by mouth daily 90 tablet 1   . metFORMIN (GLUCOPHAGE) 1000 MG tablet Take 1 tablet (1,000 mg total) by mouth 2 (two) times daily with meals 180 tablet 1   . metoprolol succinate XL (TOPROL-XL) 50 MG 24 hr tablet Take 1 tablet by mouth every evening     . TRAVATAN Z 0.004 % Solution ophthalmic solution        No current facility-administered medications for this visit.        Review of Systems   Constitutional: Positive for fatigue. Negative for chills and fever.   HENT: Negative for congestion, sinus pressure, sinus pain and sore throat.    Respiratory: Positive for cough (occasional dry cough) and shortness of breath. Negative for chest tightness.    Gastrointestinal: Negative for abdominal pain, diarrhea, nausea and vomiting.   Genitourinary: Negative for dysuria, frequency and hematuria.   Neurological: Positive for weakness and headaches (slight). Negative for dizziness and light-headedness.          Objective:   BP 136/67 (BP Site: Right arm, Patient Position: Sitting, Cuff Size: Medium)   Pulse 88   Temp 97.8 F (36.6 C) (Oral)   Resp 18   Ht 1.651 m (5\' 5" )   Wt 85.7 kg (189 lb)   SpO2 97%   BMI 31.45 kg/m     Physical Exam  Vitals signs reviewed.   Constitutional:       General: She is not in acute distress.     Appearance: She is well-developed. She is not  diaphoretic.   Eyes:      General: No scleral icterus.     Conjunctiva/sclera: Conjunctivae normal.   Cardiovascular:      Rate and Rhythm: Normal rate. Rhythm irregular.      Heart sounds: No murmur. No friction rub. No gallop.    Pulmonary:      Effort: Pulmonary effort is normal. No respiratory  distress.      Breath sounds: No wheezing or rales.   Abdominal:      Palpations: Abdomen is soft. There is no mass.      Tenderness: There is no abdominal tenderness. There is no right CVA tenderness or left CVA tenderness.   Skin:     General: Skin is warm and dry.   Neurological:      Mental Status: She is alert and oriented to person, place, and time.   Psychiatric:         Behavior: Behavior normal.       POCT UA showed small bilirubin and small blood, no leukocytes, no nitrites    Lab Results   Component Value Date    HGBA1C 7.6 (H) 10/22/2018     Lab Results   Component Value Date    WBC 8.3 07/19/2018    HGB 13.7 07/19/2018    HCT 41.7 07/19/2018    MCV 89 07/19/2018    PLT 253 07/19/2018     '  Chemistry        Component Value Date/Time    NA 141 10/22/2018 1153    K 4.4 10/22/2018 1153    CL 103 10/22/2018 1153    CL 104 02/15/2014 0733    CO2 27 10/22/2018 1153    BUN 18.0 10/22/2018 1153    CREAT 1.0 10/22/2018 1153    CREAT 0.84 07/19/2018 0000    CREAT 0.78 02/15/2014 0733    GLU 204 (H) 10/22/2018 1153        Component Value Date/Time    CA 9.3 10/22/2018 1153    ALKPHOS 98 10/22/2018 1153    AST 18 10/22/2018 1153    ALT 23 10/22/2018 1153    BILITOTAL 0.5 10/22/2018 1153          Lab Results   Component Value Date    CHOL 175 07/19/2018    CHOL 123 03/29/2018    CHOL 168 12/25/2017     Lab Results   Component Value Date    HDL 70 07/19/2018    HDL 67 03/29/2018    HDL 57 12/25/2017     Lab Results   Component Value Date    LDL 89 07/19/2018    LDL 42 03/29/2018    LDL 90 12/25/2017     Lab Results   Component Value Date    TRIG 81 07/19/2018    TRIG 70 03/29/2018    TRIG 107 12/25/2017     Lab Results   Component Value Date    TSH 0.874 07/19/2018    TSH 1.17 06/28/2013     No results found for: VITD, 25HYDROXYDTO     Assessment:     1. Weakness  - COVID-19 (SARS-CoV-2); Future  - CBC and differential  - Comprehensive metabolic panel    2. Malaise and fatigue  - COVID-19 (SARS-CoV-2); Future    3.  Dry cough  - COVID-19 (SARS-CoV-2); Future    4. Shortness of breath  - X-ray Chest PA and Lateral; Future  - COVID-19 (SARS-CoV-2); Future    5. Right  flank pain  - POCT UA  Automated (urine dipstick)  - Urine Culture        Plan:     - Recommend testing for COVID-19 given weakness, occasional dry cough and shortness of breath.     - Also recommend CXR to evaluate for pneumonia, fluid overload, etc.    - UA w/o leukocytes or nitrites. Will send UC to further evaluate flank pain    - patient to f/u with her cardiologist for continued management of HctZ and Topro.     Return if symptoms worsen or fail to improve.    Standley Dakins, MD

## 2018-12-05 LAB — COMPREHENSIVE METABOLIC PANEL
ALT: 21 IU/L (ref 0–32)
AST (SGOT): 23 IU/L (ref 0–40)
Albumin/Globulin Ratio: 1.5 (ref 1.2–2.2)
Albumin: 4.4 g/dL (ref 3.8–4.8)
Alkaline Phosphatase: 103 IU/L (ref 39–117)
BUN / Creatinine Ratio: 18 (ref 12–28)
BUN: 15 mg/dL (ref 8–27)
Bilirubin, Total: 0.3 mg/dL (ref 0.0–1.2)
CO2: 24 mmol/L (ref 20–29)
Calcium: 9.7 mg/dL (ref 8.7–10.3)
Chloride: 105 mmol/L (ref 96–106)
Creatinine: 0.83 mg/dL (ref 0.57–1.00)
EGFR: 75 mL/min/{1.73_m2} (ref >59)
EGFR: 87 mL/min/{1.73_m2} (ref >59)
Globulin, Total: 2.9 g/dL (ref 1.5–4.5)
Glucose: 118 mg/dL — ABNORMAL HIGH (ref 65–99)
Potassium: 4.4 mmol/L (ref 3.5–5.2)
Protein, Total: 7.3 g/dL (ref 6.0–8.5)
Sodium: 142 mmol/L (ref 134–144)

## 2018-12-05 LAB — CBC AND DIFFERENTIAL
Baso(Absolute): 0 10*3/uL (ref 0.0–0.2)
Basos: 0 %
Eos: 2 %
Eosinophils Absolute: 0.2 10*3/uL (ref 0.0–0.4)
Hematocrit: 37.4 % (ref 34.0–46.6)
Hemoglobin: 12.8 g/dL (ref 11.1–15.9)
Immature Granulocytes Absolute: 0 10*3/uL (ref 0.0–0.1)
Immature Granulocytes: 0 %
Lymphocytes Absolute: 2 10*3/uL (ref 0.7–3.1)
Lymphocytes: 22 %
MCH: 30.3 pg (ref 26.6–33.0)
MCHC: 34.2 g/dL (ref 31.5–35.7)
MCV: 88 fL (ref 79–97)
Monocytes Absolute: 0.7 10*3/uL (ref 0.1–0.9)
Monocytes: 8 %
Neutrophils Absolute: 6.4 10*3/uL (ref 1.4–7.0)
Neutrophils: 68 %
Platelets: 263 10*3/uL (ref 150–450)
RBC: 4.23 x10E6/uL (ref 3.77–5.28)
RDW: 13.4 % (ref 11.7–15.4)
WBC: 9.3 10*3/uL (ref 3.4–10.8)

## 2018-12-06 ENCOUNTER — Encounter (INDEPENDENT_AMBULATORY_CARE_PROVIDER_SITE_OTHER): Payer: Self-pay | Admitting: Internal Medicine

## 2018-12-07 ENCOUNTER — Observation Stay
Admission: EM | Admit: 2018-12-07 | Discharge: 2018-12-09 | Disposition: A | Discharge status: Home / Self Care | Payer: HMO | Attending: Internal Medicine | Admitting: Internal Medicine

## 2018-12-07 ENCOUNTER — Emergency Department: Payer: HMO

## 2018-12-07 DIAGNOSIS — R0789 Other chest pain: Secondary | ICD-10-CM | POA: Insufficient documentation

## 2018-12-07 DIAGNOSIS — N390 Urinary tract infection, site not specified: Secondary | ICD-10-CM | POA: Insufficient documentation

## 2018-12-07 DIAGNOSIS — I2510 Atherosclerotic heart disease of native coronary artery without angina pectoris: Secondary | ICD-10-CM

## 2018-12-07 DIAGNOSIS — E669 Obesity, unspecified: Secondary | ICD-10-CM | POA: Insufficient documentation

## 2018-12-07 DIAGNOSIS — Z7901 Long term (current) use of anticoagulants: Secondary | ICD-10-CM | POA: Insufficient documentation

## 2018-12-07 DIAGNOSIS — R778 Other specified abnormalities of plasma proteins: Secondary | ICD-10-CM

## 2018-12-07 DIAGNOSIS — I5030 Unspecified diastolic (congestive) heart failure: Secondary | ICD-10-CM | POA: Insufficient documentation

## 2018-12-07 DIAGNOSIS — E119 Type 2 diabetes mellitus without complications: Secondary | ICD-10-CM | POA: Diagnosis present

## 2018-12-07 DIAGNOSIS — R0609 Other forms of dyspnea: Secondary | ICD-10-CM

## 2018-12-07 DIAGNOSIS — Z794 Long term (current) use of insulin: Secondary | ICD-10-CM | POA: Diagnosis present

## 2018-12-07 DIAGNOSIS — R0603 Acute respiratory distress: Secondary | ICD-10-CM | POA: Insufficient documentation

## 2018-12-07 DIAGNOSIS — R0689 Other abnormalities of breathing: Secondary | ICD-10-CM | POA: Diagnosis present

## 2018-12-07 DIAGNOSIS — R0602 Shortness of breath: Secondary | ICD-10-CM | POA: Diagnosis present

## 2018-12-07 DIAGNOSIS — I872 Venous insufficiency (chronic) (peripheral): Secondary | ICD-10-CM | POA: Insufficient documentation

## 2018-12-07 DIAGNOSIS — I482 Chronic atrial fibrillation, unspecified: Secondary | ICD-10-CM

## 2018-12-07 DIAGNOSIS — R7989 Other specified abnormal findings of blood chemistry: Secondary | ICD-10-CM

## 2018-12-07 DIAGNOSIS — R918 Other nonspecific abnormal finding of lung field: Secondary | ICD-10-CM | POA: Insufficient documentation

## 2018-12-07 DIAGNOSIS — I4891 Unspecified atrial fibrillation: Secondary | ICD-10-CM | POA: Insufficient documentation

## 2018-12-07 DIAGNOSIS — Z6830 Body mass index (BMI) 30.0-30.9, adult: Secondary | ICD-10-CM | POA: Insufficient documentation

## 2018-12-07 DIAGNOSIS — M7989 Other specified soft tissue disorders: Secondary | ICD-10-CM | POA: Insufficient documentation

## 2018-12-07 DIAGNOSIS — I272 Pulmonary hypertension, unspecified: Secondary | ICD-10-CM

## 2018-12-07 DIAGNOSIS — I110 Hypertensive heart disease with heart failure: Principal | ICD-10-CM | POA: Insufficient documentation

## 2018-12-07 DIAGNOSIS — I10 Essential (primary) hypertension: Secondary | ICD-10-CM | POA: Diagnosis present

## 2018-12-07 DIAGNOSIS — E785 Hyperlipidemia, unspecified: Secondary | ICD-10-CM | POA: Insufficient documentation

## 2018-12-07 DIAGNOSIS — Z20828 Contact with and (suspected) exposure to other viral communicable diseases: Secondary | ICD-10-CM | POA: Insufficient documentation

## 2018-12-07 DIAGNOSIS — B9620 Unspecified Escherichia coli [E. coli] as the cause of diseases classified elsewhere: Secondary | ICD-10-CM | POA: Insufficient documentation

## 2018-12-07 LAB — CBC AND DIFFERENTIAL
Absolute NRBC: 0 10*3/uL (ref 0.00–0.00)
Basophils Absolute Automated: 0.04 10*3/uL (ref 0.00–0.08)
Basophils Automated: 0.4 %
Eosinophils Absolute Automated: 0.08 10*3/uL (ref 0.00–0.44)
Eosinophils Automated: 0.7 %
Hematocrit: 41.2 % (ref 34.7–43.7)
Hgb: 13.7 g/dL (ref 11.4–14.8)
Immature Granulocytes Absolute: 0.08 10*3/uL — ABNORMAL HIGH (ref 0.00–0.07)
Immature Granulocytes: 0.7 %
Lymphocytes Absolute Automated: 1.24 10*3/uL (ref 0.42–3.22)
Lymphocytes Automated: 11.3 %
MCH: 30.3 pg (ref 25.1–33.5)
MCHC: 33.3 g/dL (ref 31.5–35.8)
MCV: 91.2 fL (ref 78.0–96.0)
MPV: 11.3 fL (ref 8.9–12.5)
Monocytes Absolute Automated: 0.96 10*3/uL — ABNORMAL HIGH (ref 0.21–0.85)
Monocytes: 8.7 %
Neutrophils Absolute: 8.6 10*3/uL — ABNORMAL HIGH (ref 1.10–6.33)
Neutrophils: 78.2 %
Nucleated RBC: 0 /100 WBC (ref 0.0–0.0)
Platelets: 245 10*3/uL (ref 142–346)
RBC: 4.52 10*6/uL (ref 3.90–5.10)
RDW: 13 % (ref 11–15)
WBC: 11 10*3/uL — ABNORMAL HIGH (ref 3.10–9.50)

## 2018-12-07 LAB — COMPREHENSIVE METABOLIC PANEL
ALT: 33 U/L (ref 0–55)
AST (SGOT): 27 U/L (ref 5–34)
Albumin/Globulin Ratio: 1.1 (ref 0.9–2.2)
Albumin: 4.1 g/dL (ref 3.5–5.0)
Alkaline Phosphatase: 112 U/L — ABNORMAL HIGH (ref 37–106)
Anion Gap: 10 (ref 5.0–15.0)
BUN: 14 mg/dL (ref 7.0–19.0)
Bilirubin, Total: 1 mg/dL (ref 0.2–1.2)
CO2: 25 mEq/L (ref 22–29)
Calcium: 9.4 mg/dL (ref 8.5–10.5)
Chloride: 106 mEq/L (ref 100–111)
Creatinine: 0.7 mg/dL (ref 0.6–1.0)
Globulin: 3.7 g/dL — ABNORMAL HIGH (ref 2.0–3.6)
Glucose: 100 mg/dL (ref 70–100)
Potassium: 4.1 mEq/L (ref 3.5–5.1)
Protein, Total: 7.8 g/dL (ref 6.0–8.3)
Sodium: 141 mEq/L (ref 136–145)

## 2018-12-07 LAB — MAGNESIUM: Magnesium: 1.7 mg/dL (ref 1.6–2.6)

## 2018-12-07 LAB — BASIC METABOLIC PANEL
Anion Gap: 14 (ref 5.0–15.0)
BUN: 12 mg/dL (ref 7.0–19.0)
CO2: 25 mEq/L (ref 22–29)
Calcium: 9.5 mg/dL (ref 8.5–10.5)
Chloride: 103 mEq/L (ref 100–111)
Creatinine: 0.7 mg/dL (ref 0.6–1.0)
Glucose: 68 mg/dL — ABNORMAL LOW (ref 70–100)
Potassium: 3.8 mEq/L (ref 3.5–5.1)
Sodium: 142 mEq/L (ref 136–145)

## 2018-12-07 LAB — HEMOLYSIS INDEX
Hemolysis Index: 4 (ref 0–18)
Hemolysis Index: 9 (ref 0–18)

## 2018-12-07 LAB — GFR
EGFR: >60
EGFR: >60

## 2018-12-07 LAB — TROPONIN I
Troponin I: 0.07 ng/mL — ABNORMAL HIGH (ref 0.00–0.05)
Troponin I: 0.02 ng/mL (ref 0.00–0.05)

## 2018-12-07 LAB — GLUCOSE WHOLE BLOOD - POCT: Whole Blood Glucose POCT: 61 mg/dL — ABNORMAL LOW (ref 70–100)

## 2018-12-07 LAB — B-TYPE NATRIURETIC PEPTIDE: B-Natriuretic Peptide: 322 pg/mL — ABNORMAL HIGH (ref 0–100)

## 2018-12-07 LAB — TSH: TSH: 0.98 u[IU]/mL (ref 0.35–4.94)

## 2018-12-07 MED ORDER — METOPROLOL SUCCINATE ER 25 MG PO TB24
25.00 mg | ORAL_TABLET | Freq: Every evening | ORAL | Status: DC
Start: 2018-12-07 — End: 2018-12-09
  Administered 2018-12-07 – 2018-12-08 (×2): 25 mg via ORAL
  Filled 2018-12-07 (×3): qty 1

## 2018-12-07 MED ORDER — GLUCOSE 40 % PO GEL
15.00 g | ORAL | Status: DC | PRN
Start: 2018-12-07 — End: 2018-12-09

## 2018-12-07 MED ORDER — FAMOTIDINE 10 MG/ML IV SOLN (WRAP)
20.00 mg | Freq: Two times a day (BID) | INTRAVENOUS | Status: DC
Start: 2018-12-07 — End: 2018-12-09

## 2018-12-07 MED ORDER — ACETAMINOPHEN 650 MG RE SUPP
650.00 mg | Freq: Four times a day (QID) | RECTAL | Status: DC | PRN
Start: 2018-12-07 — End: 2018-12-09

## 2018-12-07 MED ORDER — NIFEDIPINE ER OSMOTIC RELEASE 30 MG PO TB24
60.00 mg | ORAL_TABLET | Freq: Every day | ORAL | Status: DC
Start: 2018-12-07 — End: 2018-12-09
  Administered 2018-12-07 – 2018-12-09 (×3): 60 mg via ORAL
  Filled 2018-12-07 (×3): qty 2

## 2018-12-07 MED ORDER — NALOXONE HCL 0.4 MG/ML IJ SOLN (WRAP)
0.20 mg | INTRAMUSCULAR | Status: DC | PRN
Start: 2018-12-07 — End: 2018-12-09

## 2018-12-07 MED ORDER — LISINOPRIL 20 MG PO TABS
40.0000 mg | ORAL_TABLET | Freq: Every day | ORAL | Status: DC
Start: 2018-12-07 — End: 2018-12-09
  Administered 2018-12-08 – 2018-12-09 (×2): 40 mg via ORAL
  Filled 2018-12-07: qty 4
  Filled 2018-12-07 (×2): qty 2

## 2018-12-07 MED ORDER — ONDANSETRON HCL 4 MG/2ML IJ SOLN
4.00 mg | Freq: Four times a day (QID) | INTRAMUSCULAR | Status: DC | PRN
Start: 2018-12-07 — End: 2018-12-09

## 2018-12-07 MED ORDER — GLUCAGON 1 MG IJ SOLR (WRAP)
1.00 mg | INTRAMUSCULAR | Status: DC | PRN
Start: 2018-12-07 — End: 2018-12-09

## 2018-12-07 MED ORDER — INSULIN LISPRO 100 UNIT/ML SC SOLN
1.00 [IU] | Freq: Every evening | SUBCUTANEOUS | Status: DC
Start: 2018-12-07 — End: 2018-12-09

## 2018-12-07 MED ORDER — ACETAMINOPHEN 325 MG PO TABS
650.0000 mg | ORAL_TABLET | Freq: Four times a day (QID) | ORAL | Status: DC | PRN
Start: 2018-12-07 — End: 2018-12-09
  Administered 2018-12-07 (×2): 650 mg via ORAL
  Filled 2018-12-07: qty 2

## 2018-12-07 MED ORDER — ATORVASTATIN CALCIUM 20 MG PO TABS
20.0000 mg | ORAL_TABLET | Freq: Every evening | ORAL | Status: DC
Start: 2018-12-07 — End: 2018-12-09
  Administered 2018-12-07 – 2018-12-08 (×2): 20 mg via ORAL
  Filled 2018-12-07 (×2): qty 1

## 2018-12-07 MED ORDER — FUROSEMIDE 10 MG/ML IJ SOLN
40.00 mg | Freq: Every day | INTRAMUSCULAR | Status: DC
Start: 2018-12-08 — End: 2018-12-09
  Administered 2018-12-08 – 2018-12-09 (×2): 40 mg via INTRAVENOUS
  Filled 2018-12-07 (×2): qty 4

## 2018-12-07 MED ORDER — ONDANSETRON 4 MG PO TBDP
4.00 mg | ORAL_TABLET | Freq: Four times a day (QID) | ORAL | Status: DC | PRN
Start: 2018-12-07 — End: 2018-12-09

## 2018-12-07 MED ORDER — INSULIN NPH (HUMAN) (ISOPHANE) 100 UNIT/ML SC SUSP
50.00 [IU] | Freq: Two times a day (BID) | SUBCUTANEOUS | Status: DC
Start: 2018-12-07 — End: 2018-12-09
  Administered 2018-12-08 (×2): 50 [IU] via SUBCUTANEOUS
  Filled 2018-12-07 (×2): qty 150

## 2018-12-07 MED ORDER — DEXTROSE 50 % IV SOLN
12.50 g | INTRAVENOUS | Status: DC | PRN
Start: 2018-12-07 — End: 2018-12-09

## 2018-12-07 MED ORDER — INSULIN LISPRO 100 UNIT/ML SC SOLN
1.00 [IU] | Freq: Three times a day (TID) | SUBCUTANEOUS | Status: DC
Start: 2018-12-07 — End: 2018-12-09
  Administered 2018-12-08: 11:00:00 3 [IU] via SUBCUTANEOUS
  Filled 2018-12-07: qty 6

## 2018-12-07 MED ORDER — FUROSEMIDE 10 MG/ML IJ SOLN
40.00 mg | Freq: Once | INTRAMUSCULAR | Status: AC
Start: 2018-12-07 — End: 2018-12-07
  Administered 2018-12-07: 40 mg via INTRAVENOUS
  Filled 2018-12-07: qty 4

## 2018-12-07 MED ORDER — SODIUM CHLORIDE 0.9 % IV MBP
1.00 g | Freq: Once | INTRAVENOUS | Status: AC
Start: 2018-12-07 — End: 2018-12-07
  Administered 2018-12-07: 1 g via INTRAVENOUS
  Filled 2018-12-07: qty 1000

## 2018-12-07 MED ORDER — POLYETHYLENE GLYCOL 3350 17 G PO PACK
17.00 g | PACK | Freq: Every day | ORAL | Status: DC | PRN
Start: 2018-12-07 — End: 2018-12-09

## 2018-12-07 MED ORDER — FUROSEMIDE 10 MG/ML IJ SOLN
20.00 mg | Freq: Once | INTRAMUSCULAR | Status: AC
Start: 2018-12-07 — End: 2018-12-07
  Administered 2018-12-07: 20 mg via INTRAVENOUS
  Filled 2018-12-07: qty 2

## 2018-12-07 MED ORDER — FAMOTIDINE 20 MG PO TABS
20.0000 mg | ORAL_TABLET | Freq: Two times a day (BID) | ORAL | Status: DC
Start: 2018-12-07 — End: 2018-12-09
  Administered 2018-12-07 – 2018-12-09 (×3): 20 mg via ORAL
  Filled 2018-12-07 (×4): qty 1

## 2018-12-07 MED ORDER — APIXABAN 5 MG PO TABS
5.0000 mg | ORAL_TABLET | Freq: Two times a day (BID) | ORAL | Status: DC
Start: 2018-12-07 — End: 2018-12-09
  Administered 2018-12-07 – 2018-12-09 (×4): 5 mg via ORAL
  Filled 2018-12-07 (×4): qty 1

## 2018-12-07 NOTE — ED Triage Notes (Signed)
Pt presents to ED with c/o SOB for the past few weeks and chest pain that started today. Pt was recently tested for covid-19 and reports her test was negative but still has SOB. Pt has hx of afib and takes Eliquis. Daughter Toney Reil) can be reached at [phone].

## 2018-12-07 NOTE — UM Notes (Addendum)
Patient name: [name]     DOB: 03/31/1955       Primary payor: [employer]       Admission order: Admit to Observation (Order [id number])12/07/18 01:06 PM       Diagnosis     ICD-10-CM   1. SOB (shortness of breath)  R06.02        [name] is a 63 y.o. female presenting to ED with shortness of breath for one month but getting progressively worse for the last week. The symptoms are associated with chest tightness, dyspnea on exertion, and orthopnea. Patient saw her cardiologist one week ago and her Lasix dose was reduced. She had a negative COVID test this week.       Past Medical History: DM type II, diabetic retinopathy, glaucoma, HLD, HTN, obesity, venous insufficiency     Past Surgical History: breast cyst removal, tubal ligation       Vital Signs    Temp:  [97.7 F (36.5 C)]     Heart Rate:  [74-81]     Resp Rate:  [23-30]     BP: (168-188)/(71-116)     SpO2:  [92 %-96 %] room air      Vitals:    12/07/18 1114 12/07/18 1200 12/07/18 1300   BP: 168/71 (!) 188/116 176/79   Pulse: 74 81 78   Resp: (!) 23 (!) 30 (!) 23   Temp: 97.7 F (36.5 C)     TempSrc: Oral     SpO2: 96% 92% 94%          EKG: Time Interpreted: 1110 AM              Rate: 88              Rhythm: Atrial Fibrillation               Interpretation: No ST/TW changes, inf q-waves.               Comparison: Predominately unchanged from 10/2017    Lab Results last 48 Hours     Procedure Component Value Units Date/Time    Troponin I [[id number]]  (Abnormal) Collected: 12/07/18 1203    Specimen: Blood Updated: 12/07/18 1237     Troponin I 0.07 ng/mL     B-type Natriuretic Peptide [[id number]]  (Abnormal) Collected: 12/07/18 1203    Specimen: Blood Updated: 12/07/18 1237     B-Natriuretic Peptide 322 pg/mL     Hemolysis index [[id number]] Collected: 12/07/18 1203     Updated: 12/07/18 1229     Hemolysis Index 4    GFR [[id number]] Collected: 12/07/18 1203     Updated: 12/07/18 1229     EGFR >60.0    Comprehensive  metabolic panel [[id number]]  (Abnormal) Collected: 12/07/18 1203    Specimen: Blood Updated: 12/07/18 1229     Glucose 100 mg/dL      BUN 24.4 mg/dL      Creatinine 0.7 mg/dL      Sodium 010 mEq/L      Potassium 4.1 mEq/L      Chloride 106 mEq/L      CO2 25 mEq/L      Calcium 9.4 mg/dL      Protein, Total 7.8 g/dL      Albumin 4.1 g/dL      AST (SGOT) 27 U/L      ALT 33 U/L      Alkaline Phosphatase 112 U/L      Bilirubin,  Total 1.0 mg/dL      Globulin 3.7 g/dL      Albumin/Globulin Ratio 1.1     Anion Gap 10.0    CBC and differential [[id number]]  (Abnormal) Collected: 12/07/18 1203    Specimen: Blood Updated: 12/07/18 1214     WBC 11.00 x10 3/uL      Hgb 13.7 g/dL      Hematocrit 62.9 %      Platelets 245 x10 3/uL      RBC 4.52 x10 6/uL      MCV 91.2 fL      MCH 30.3 pg      MCHC 33.3 g/dL      RDW 13 %      MPV 11.3 fL      Neutrophils 78.2 %      Lymphocytes Automated 11.3 %      Monocytes 8.7 %      Eosinophils Automated 0.7 %      Basophils Automated 0.4 %      Immature Granulocytes 0.7 %      Nucleated RBC 0.0 /100 WBC      Neutrophils Absolute 8.60 x10 3/uL      Lymphocytes Absolute Automated 1.24 x10 3/uL      Monocytes Absolute Automated 0.96 x10 3/uL      Eosinophils Absolute Automated 0.08 x10 3/uL      Basophils Absolute Automated 0.04 x10 3/uL      Immature Granulocytes Absolute 0.08 x10 3/uL      Absolute NRBC 0.00 x10 3/uL     COVID-19 (SARS-COV-2) ([facility] Standard test) [[id number]] Collected: 12/07/18 1203    Specimen: Nasopharyngeal Swab from Nasopharynx Updated: 12/07/18 1203       Chest AP Portable Diffuse bilateral interstitial and airspace opacities is partly related to chronic lung disease however superimposed acute process such as pneumonia or pulmonary edema is suspected.      Scheduled Meds:    furosemide 40 mg Intravenous Once      ED Medications    furosemide (LASIX) injection 40 mg (has no administration in time range)      Orders non-meds: covid r/o, covid isolation      H&P 12/07/2018     Acute respiratory insufficiency: Likely due to viral infection versus CHF    Pneumonia versus pulmonary edema    Suspected COVID-19 infection    Plan: -Continue Lasix             -Continue oxygen support             -Add albuterol inhaler for shortness of breath and low-dose steroid             -Continue incentive spirometry             -Closely monitor respiratory status    Acute CHF exacerbation    Elevated troponin: Likely troponin leak, will rule out ACS    Plan: -[facility] cardiology consult             -Resume home beta-blocker, lisinopril             -Daily weight             -Cardiac diet    History of diabetes    Plan: -Add correctional insulin              -Diabetic diet              -Last hemoglobin A1c in September 2020 noted at 7.6  Obstructive CAD    Elevated troponin    Plan: -Cardiology consult              -Continue to trend troponins             -Admit to telemetry and monitor closely             -Further recommendations per cardiology.    Suspected COVID-19 infection    Last Covid test was negative -2 days ago    Plan: -Maintain appropriate isolation. R/O COVID               -Check inflammatory markers if Covid is positive    Uncontrolled hypertension    Plan: Resume home antihypertensives, cardiology consult, monitor telemetry     History of A. Fib, rate controlled    Plan: Resume home Eliquis, cardiology consult, monitor telemetry     History of dyslipidemia    Plan: Resume home statin           Garry Heater, RN MSN       Clinical Case Manager       [facility]        [address] [place], [place] [address]      [email or web address]      [phone]

## 2018-12-07 NOTE — Consults (Signed)
[facility] CONSULT NOTE  Date:  12/07/2018  3:05 PM  Cardiologist: Miranda Shutters MD (last OV 11/27/2018)    Case Discussed with [facility] Attending Miranda Cane MD).  Discussed plan of care/procedure with patient/family who stated understanding.  Provider Completing Note:  [name] CRNP, ANP-BC  [facility]  [facility]  [facility] [phone] (7am-7pm)  OFFICE Number [phone] (7pm-7am)  MD LINE [phone]    Patient:  [name].  DOB: 1955/07/03.  female  Date of Admission:  12/07/2018  Attending:  Clarita Leber, MD     Reason For Consultation:   CHF    ASSESSMENT/PLAN:   COVID-19 Standard (12/07/2018): Pending.    Hx DOE  Mildly Elevated BNP (332) - likely in setting of HFpEF  - 2D Echocardiogram (11/27/2018):  LVEF 61%.   - Chest -Xray (12/07/2018):  Diffuse bilateral interstitial and airspace opacities is partly related to chronic lung disease however superimposed acute process such as pneumonia or pulmonary edema is suspected.  - Received Lasix 40 mg IV x1 and will be continued daily. Will give another Lasix 20 mg IV at 9 pm tonight.   - Strict I's and O's and STANDING daily weights. OV wt 11/27/2018: 184 lbs  - Fluid restrict to 1.5 L/day and Na 2 g/day  - Keep K/Mag >4.0/2.0, replete PRN  - Further recs to follow.  - Will continue to follow.    Pulmonary HTN (56 mmHg 04/2018 --> 35 mmHg 11/2018) noted on echo, see below.    CAD  Hx Abnormal EKG (Inf Q waves/non-spec T wave changes)  Hx Atypical Chest Pain  - Trop 0.07, will continue to trend until peak.  - EKG noted AF 88 bpm inferior MI, non-spec TW abn inferolateral leads, will review w/MD  - Cardiac Cath (2002): 30% LCx stenosis.  - Exercise NMST (11/27/2018): No ischemia.  - On Toprol XL 25 mg QD, statin.   - Likely demand ischemia and no further ischemic eval needed at this time.    Chronic Atrial Fibrillation  - Controlled rates.  - On BB and Eliquis 5 mg BID for  CHA2DS2-Vasc 3 (HTN, DM2, CAD)    HTN  - Elevated  - On BB, HCTz 12.5 mg QD, Nifedipine ER 60 mg QD, Lisinopril 40 mg QD    HLD - On Atorvastatin 40 mg QD.    CODE Status: Full    HISTORY OF PRESENT ILLNESS / SUBJECTIVE:   [name] is a 63 y.o. female with a history of chronic AF, CAD  Hx Abnormal EKG (Inf Q waves/non-spec T wave changes)/Hx Atypical Chest Pain, Pulmonary HTN (56 mmHg 04/2018 --> 35 mmHg 11/2018), HTN, HLD, DM2 who presented to the hospital on 12/07/2018 with worsening SOB, DOE/orthopnea. She saw Dr. Fransisco [name] for similar complaints on 11/27/2018 and ischemic work-up done on the same day were reported as normal (noted elevated BP during Exercise NMST and was noted as possible cause of SOB) and replaced amlodipine with nifedipine ER.     Patient was seen by my in adherence with the COVID-19 protocol of minimizing unnecessary direct contact with the patient. Exam from previous providers reviewed.    Cardiology consulted for CHF.  Reported no current tobacco/alcohol/recreational drug use.    REVIEW OF SYSTEMS:  ARROS, ROSSSSS   Patient was seen by my in adherence with the COVID-19 protocol of minimizing unnecessary direct contact with the patient. Exam from previous providers reviewed.    CARDIAC DIAGNOSTICS:                                         [id number]     Telemetry:  (I independently reviewed the telemetry data) AF 80-90;s bpm    ECG (12/07/2018):  (I independently reviewed the ECG tracing)  AF 88 bpm inferior MI, non-spec TW abn inferolateral leads     Chest -Xray (12/07/2018):  Diffuse bilateral interstitial and airspace opacities is partly related to chronic lung disease however superimposed acute process such as pneumonia or pulmonary edema is suspected.    2D Echocardiogram (11/27/2018):  LVEF 61%. Borderline LVH. Biatrial enlargement L>R. Moderate TR w/top normal RVSP (56 mmHg --> 35 mmHg)    Exercise NMStress Test (11/27/2018): Borderline stress  EKG for ischemic response. Normal perfusion imaging. No evidence for any ischemia or prior infarct at level of exercise achieved. 7 METS. Normal LVEF 58%    Cardiac Cath (2002): 30% LCx stenosis.     PAST MEDICAL HISTORY:     Past Medical History:   Diagnosis Date   . Diabetes mellitus type 2 in obese    . Diabetic retinopathy 06/28/2013    Of the right eye, per patient   . Glaucoma 06/01/2012   . Hyperlipidemia    . Hypertension    . Obesity    . Venous insufficiency 09/17/2014    Followed at [facility].  Wears compression stockings.      Past Surgical History:   Procedure Laterality Date   . BREAST CYST EXCISION  1993    bilateral   . TUBAL LIGATION         SOCIAL HISTORY:     Social History     Tobacco Use   . Smoking status: Never Smoker   . Smokeless tobacco: Never Used   Substance Use Topics   . Alcohol use: No       FAMILY HISTORY:   family history includes Cancer in her maternal uncle; Cirrhosis in her father; Diabetes in her father, mother, and sister; Heart disease in her father and mother; Hyperlipidemia in her father and mother; Hypertension in her father and mother; Liver cancer in her maternal aunt.    PHYSICAL EXAM:                                                        ARPE3, ARPE2, PEEEEE   BP 176/79   Pulse 78   Temp 97.7 F (36.5 C) (Oral)   Resp (!) 23   Ht 1.651 m (5\' 5" )   Wt 83.5 kg (184 lb)   SpO2 94%   BMI 30.62 kg/m  No intake or output data in the 24 hours ending 12/07/18 1505    Patient was seen by my in adherence with the COVID-19 protocol of minimizing unnecessary direct contact with the patient. Exam from previous providers reviewed.    LABORATORY:     CBC w/Diff   Recent Labs   Lab 12/07/18  1203 12/04/18  0000   WBC 11.00*  9.3   Hemoglobin  --  12.8   Hgb 13.7  --    Hematocrit 41.2 37.4   Platelets 245 263        Basic Metabolic Profile   Recent Labs   Lab 12/07/18  1203 12/04/18  0000   Sodium 141 142   Potassium 4.1 4.4   Chloride 106 105   CO2 25 24   BUN  14.0 15   Creatinine 0.7 0.83   EGFR >60.0 87  75   Glucose 100 118*   Calcium 9.4 9.7        Cardiac Enzymes   Recent Labs   Lab 12/07/18  1203   Troponin I 0.07*     Recent Labs   Lab 12/07/18  1203   B-Natriuretic Peptide 322*        D-dimer          Thyroid Studies         Invalid input(s): FREET4     Cholesterol Panel          Coagulation Studies          MEDICATIONS:    INFUSION MEDS:      SCHEDULED MEDS:     Current Facility-Administered Medications   Medication Dose Route Frequency   . apixaban  5 mg Oral Q12H   . atorvastatin  20 mg Oral Daily   . cefTRIAXone  1 g Intravenous Once   . famotidine  20 mg Oral Q12H SCH    Or   . famotidine  20 mg Intravenous Q12H SCH   . [START ON 12/08/2018] furosemide  40 mg Intravenous Daily   . insulin lispro  1-4 Units Subcutaneous QHS   . insulin lispro  1-8 Units Subcutaneous TID AC   . lisinopril  40 mg Oral Daily      PRN MEDS:   acetaminophen **OR** acetaminophen, Nursing communication: Adult Hypoglycemia Treatment Algorithm **AND** dextrose **AND** dextrose **AND** glucagon (rDNA), naloxone, ondansetron **OR** ondansetron, polyethylene glycol

## 2018-12-07 NOTE — H&P (Signed)
[facility]      Patient: [name]  Date: 12/07/2018   DOB: [birth year]  Admission Date: 12/07/2018   MRN: [id number]  Attending: Dr. Olena Heckle NP         Chief Complaint   Patient presents with   . Shortness of Breath   . Chest Pain      History Gathered From: Patient, ED attending and patient's daughter.    HISTORY AND PHYSICAL     [name] is a 63 y.o. female with a PMHx of obstructive CAD, A. fib on Eliquis, hypertension, hyperlipidemia, type 2 diabetes who is followed by Dr. Fransisco Beau cardiology presented to the hospital with worsening shortness of breath for few weeks which has progressively worsened.  Patient states she is unable to walk a short distance without feeling very tired and short of breath, she cannot climb stairs or lay flat to sleep.  She has been working with her cardiologist who made some changes to her medications with some improvement in leg swelling and shortness of breath. States she has been unable to do her exercises by walking or play with her grandkids due to feeling short of breath, she was recently tested negative for Covid last week Wednesday. States she woke up this morning, went to her ophthalmologist to pick up her eyedrops prescriptions and felt very short of breath with left-sided chest pain prompting ED evaluation. Described chest pain as tightness, no radiation, worse with deep breathing. She was chest pain-free during evaluation.  Denies fever, cough, lightheadedness/dizziness, palpitations, eg pain, sick contact with known Covid patient.    In the emergency room, her chest x-ray suspicious for pneumonia versus pulmonary edema, labs revealed troponin of 0.07, WBC of 11.00, BNP of 322.  [facility] cardiology on board.    Past Medical History:   Diagnosis Date   . Diabetes mellitus type 2 in obese    . Diabetic retinopathy 06/28/2013    Of the right eye, per patient   . Glaucoma 06/01/2012   . Hyperlipidemia    . Hypertension    . Obesity    . Venous  insufficiency 09/17/2014    Followed at [facility].  Wears compression stockings.        Past Surgical History:   Procedure Laterality Date   . BREAST CYST EXCISION  1993    bilateral   . TUBAL LIGATION         Prior to Admission medications    Medication Sig Start Date End Date Taking? Authorizing Provider   apixaban (ELIQUIS) 5 MG Take 5 mg by mouth every 12 (twelve) hours.    [provider]   atorvastatin (LIPITOR) 20 MG tablet Take 1 tablet (20 mg total) by mouth daily 10/22/18 10/22/19  Castillo-Catoni, Maudry Mayhew, MD   Blood Glucose Monitoring Suppl (ACCU-CHEK AVIVA PLUS) w/Device Kit Check blood glucose level twice daily. ICD 10 Code E11.9, Z79.4 06/11/18   Castillo-Catoni, Maudry Mayhew, MD   Calcium Carbonate-Vitamin D (CALCIUM-D PO) Take 1 tablet by mouth daily.    [provider]   glucose blood (Accu-Chek Aviva) test strip Check blood glucose level twice daily. ICD 10 Code E11.9, Z79.4 10/22/18   Castillo-Catoni, Maudry Mayhew, MD   hydroCHLOROthiazide (HYDRODIURIL) 12.5 MG tablet Take 12.5 mg by mouth daily    11/07/18   [provider]   insulin NPH (NovoLIN N) 100 UNIT/ML injection INJECT 50 UNITS SUBCUTANEOUSLY WITH BREAKFST AND 24 UNITS WITH DINNER 10/22/18  Castillo-Catoni, Maudry Mayhew, MD   Insulin Syringe-Needle U-100 (BD INSULIN SYRINGE ULTRAFINE) 31G X 5/16" 1 ML Misc BD Insulin Syringe Ultra-Fine 1 mL 31 gauge x 5/16"    [provider]   Insulin Syringe-Needle U-100 (BD INSULIN SYRINGE ULTRAFINE) 31G X 5/16" 1 ML Misc ADMINISTER INSULIN SUBCUATANEOUSLY TWICE A DAY 07/19/18   Castillo-Catoni, Maudry Mayhew, MD   Lancets (ACCU-CHEK SOFT TOUCH) lancets Check blood glucose level twice daily. ICD 10 Code E11.9, Z79.4 06/11/18 06/06/19  Castillo-Catoni, Maudry Mayhew, MD   lisinopril (ZESTRIL) 40 MG tablet Take 1 tablet (40 mg total) by mouth daily 10/22/18 10/22/19  Castillo-Catoni, Maudry Mayhew, MD   metFORMIN (GLUCOPHAGE) 1000 MG tablet Take 1 tablet (1,000 mg total) by mouth 2 (two) times  daily with meals 10/22/18 10/22/19  Castillo-Catoni, Maudry Mayhew, MD   metoprolol succinate XL (TOPROL-XL) 50 MG 24 hr tablet Take 25 mg by mouth every evening       [provider]   NIFEdipine (PROCARDIA XL) 60 MG 24 hr tablet Take 60 mg by mouth daily    11/27/18   [provider]   TRAVATAN Z 0.004 % Solution ophthalmic solution  03/08/17   [provider]       Allergies   Allergen Reactions   . Shellfish-Derived Products        CODE STATUS: Full code    PRIMARY CARE MD: Standley Dakins, MD    Family History   Problem Relation Age of Onset   . Heart disease Mother    . Diabetes Mother    . Hypertension Mother    . Hyperlipidemia Mother    . Heart disease Father    . Diabetes Father    . Hypertension Father    . Hyperlipidemia Father    . Cirrhosis Father    . Liver cancer Maternal Aunt    . Cancer Maternal Uncle         liver   . Diabetes Sister        Social History     Tobacco Use   . Smoking status: Never Smoker   . Smokeless tobacco: Never Used   Substance Use Topics   . Alcohol use: No   . Drug use: No       REVIEW OF SYSTEMS   Positive for: See HPI  Negative for: See HPI  All ROS completed and otherwise negative.    PHYSICAL EXAM      Vital Signs (most recent): BP 176/79   Pulse 92   Temp 97.7 F (36.5 C) (Oral)   Resp (!) 24   Ht 1.651 m (5\' 5" )   Wt 83.5 kg (184 lb)   SpO2 95%   BMI 30.62 kg/m   Constitutional: Obese female in mild distress when talking, no use of accessory muscles, alert oriented x3, nontoxic looking.  HEENT: NC/AT, PERRL, no scleral icterus, no MMM,    Neck: trachea midline, supple, no cervical, no masses  Cardiovascular: RRR, normal S1 S2, no murmurs, no JVD, trace edema BLE  Respiratory: Normal rate,scattered Rhonchi in all lobes, no wheezing.  Gastrointestinal: +BS, non-distended, soft, non-tender, no guarding   Genitourinary: no suprapubic or costovertebral angle tenderness  Musculoskeletal: ROM and motor strength grossly normal. No clubbing,  trace edema to lower extremities, no cyanosis. DP and radial pulses 2+ and symmetric.  Skin exam:Normal  Neurologic: EOMI, CN 2-12 grossly intact. no gross motor or sensory deficits  Psychiatric: AAOx3, affect and mood appropriate. The patient  is alert, interactive, appropriate.  Capillary refill: Normal    Exam done by Pilar Plate, NP on 12/07/18 at 3:12 PM      LABS & IMAGING     Recent Results (from the past 24 hour(s))   Comprehensive metabolic panel    Collection Time: 12/07/18 12:03 PM   Result Value Ref Range    Glucose 100 70 - 100 mg/dL    BUN 62.9 7.0 - 52.8 mg/dL    Creatinine 0.7 0.6 - 1.0 mg/dL    Sodium 413 244 - 010 mEq/L    Potassium 4.1 3.5 - 5.1 mEq/L    Chloride 106 100 - 111 mEq/L    CO2 25 22 - 29 mEq/L    Calcium 9.4 8.5 - 10.5 mg/dL    Protein, Total 7.8 6.0 - 8.3 g/dL    Albumin 4.1 3.5 - 5.0 g/dL    AST (SGOT) 27 5 - 34 U/L    ALT 33 0 - 55 U/L    Alkaline Phosphatase 112 (H) 37 - 106 U/L    Bilirubin, Total 1.0 0.2 - 1.2 mg/dL    Globulin 3.7 (H) 2.0 - 3.6 g/dL    Albumin/Globulin Ratio 1.1 0.9 - 2.2    Anion Gap 10.0 5.0 - 15.0   CBC and differential    Collection Time: 12/07/18 12:03 PM   Result Value Ref Range    WBC 11.00 (H) 3.10 - 9.50 x10 3/uL    Hgb 13.7 11.4 - 14.8 g/dL    Hematocrit 27.2 53.6 - 43.7 %    Platelets 245 142 - 346 x10 3/uL    RBC 4.52 3.90 - 5.10 x10 6/uL    MCV 91.2 78.0 - 96.0 fL    MCH 30.3 25.1 - 33.5 pg    MCHC 33.3 31.5 - 35.8 g/dL    RDW 13 11 - 15 %    MPV 11.3 8.9 - 12.5 fL    Neutrophils 78.2 None %    Lymphocytes Automated 11.3 None %    Monocytes 8.7 None %    Eosinophils Automated 0.7 None %    Basophils Automated 0.4 None %    Immature Granulocytes 0.7 None %    Nucleated RBC 0.0 0.0 - 0.0 /100 WBC    Neutrophils Absolute 8.60 (H) 1.10 - 6.33 x10 3/uL    Lymphocytes Absolute Automated 1.24 0.42 - 3.22 x10 3/uL    Monocytes Absolute Automated 0.96 (H) 0.21 - 0.85 x10 3/uL    Eosinophils Absolute Automated 0.08 0.00 - 0.44 x10 3/uL    Basophils  Absolute Automated 0.04 0.00 - 0.08 x10 3/uL    Immature Granulocytes Absolute 0.08 (H) 0.00 - 0.07 x10 3/uL    Absolute NRBC 0.00 0.00 - 0.00 x10 3/uL   Troponin I    Collection Time: 12/07/18 12:03 PM   Result Value Ref Range    Troponin I 0.07 (H) 0.00 - 0.05 ng/mL   B-type Natriuretic Peptide    Collection Time: 12/07/18 12:03 PM   Result Value Ref Range    B-Natriuretic Peptide 322 (H) 0 - 100 pg/mL   Hemolysis index    Collection Time: 12/07/18 12:03 PM   Result Value Ref Range    Hemolysis Index 4 0 - 18   GFR    Collection Time: 12/07/18 12:03 PM   Result Value Ref Range    EGFR >60.0    COVID-19 (SARS-COV-2) ([facility] Standard test)    Collection Time: 12/07/18 12:03 PM  Specimen: Nasopharynx; Nasopharyngeal Swab   Result Value Ref Range    Purpose of COVID testing Diagnostic -PUI     SARS-CoV-2 Specimen Source Nasopharyngeal        MICROBIOLOGY:  Blood Culture:   Urine Culture:   Antibiotics Started:      IMAGING:  Upon my review:    Chest Ap Portable    Result Date: 12/07/2018   Diffuse bilateral interstitial and airspace opacities is partly related to chronic lung disease however superimposed acute process such as pneumonia or pulmonary edema is suspected. Gustavus Messing, MD  12/07/2018 11:43 AM      CARDIAC:  EKG Interpretation (upon my review):   Afib, rate controlled at 88    Markers:  Recent Labs   Lab 12/07/18  1203   Troponin I 0.07*       EMERGENCY DEPARTMENT COURSE:  Orders Placed This Encounter   Procedures   . COVID-19 (SARS-COV-2) ([facility] Standard test)   . Chest AP Portable   . Comprehensive metabolic panel   . CBC and differential   . Troponin I   . B-type Natriuretic Peptide   . Hemolysis index   . GFR   . Basic Metabolic Panel   . CBC without differential   . Troponin I   . Diet Consistent Carbohydrate and Heart Healthy   . Cardiac monitoring (ED ONLY)   . Eye protection shoud be worn for all instances of  patient contact   . Nursing communication   . Pulse Oximetry   . Progressive Mobility  Protocol   . Notify physician   . I/O   . Height   . Weight   . Skin assessment   . Place sequential compression device   . Maintain sequential compression device   . Education: Activity   . Education: Disease Process & Condition   . Education: Pain Management   . Education: Falls Risk   . Education: Smoking Cessation   . Telemetry 24 Hour Protocol   . Incentive spirometry nursing   . Nasal Cannula Low-Flow (5 LPM or less)   . Notify Physician (Critical Blood Glucose Value)   . Notify physician (Communication: Document Abnormal Blood Glucose)   . Target Glucose Goals   . Nursing communication   . NSG Communication: Glucose POCT order (PRN hypoglycemia)   . Nursing communication: Adult Hypoglycemia Treatment Algorithm   . NSG Communication: Glucose POCT order   . NSG Communication: Glucose POCT order   . Education: Hypoglycemia/Hyperglycemia   . Full Code   . ED Unit Sec Comm Order   . Contact isolation   . Droplet isolation   . ECG 12 Lead   . Saline lock IV   . Saline lock IV   . Admit to Observation   . Supervise For Meals Frequency: All meals       ASSESSMENT & PLAN     [name] is a 63 y.o. female admitted with SOB (shortness of breath).    Patient Active Hospital Problem List:  Acute respiratory insufficiency (12/07/2018)    Acute CHF  Elevated troponin  Suspected COVID-19 infection  Uncontrolled hypertension  Leukocytosis  History of A. fib on Eliquis  History of diabetes mellitus type 2 in obese   History of dyslipidemia       plan:  Acute respiratory insufficiency: Likely due to viral infection versus CHF  Pneumonia versus pulmonary edema  Suspected COVID-19 infection  -Chest x-ray reviewed and noted  -COVID-19 test is pending  -  Continue Lasix  -Continue oxygen support  -Add albuterol inhaler for shortness of breath and low-dose steroid  -Continue incentive spirometry  -Closely monitor respiratory status    Acute CHF exacerbation  Elevated troponin: Likely troponin leak, will rule out ACS  [facility]  cardiologist on board  -Patient's primary cardiologist is Dr. Fransisco Beau  -Patient's daughter reports recent ECHO in Dr. Marcha Solders office   -Last echo in epic done September 02, 2015 showed normal systolic function with EF of 60%  -Resume home beta-blocker, lisinopril  -Daily weight  -Cardiac diet    History of diabetes  -Add correctional insulin  -Diabetic diet  -Last hemoglobin A1c in September 2020 noted at 7.6    Obstructive CAD  Elevated troponin  -Cardiologist on board  -Last echocardiogram done September 2020 showed PA pressure increased to 56 right ventricular enlargement, mild to moderate tricuspid regurgitation with normal LV function.  -Continue to trend troponins  -Admit to telemetry and monitor closely  -Further recommendations per cardiology.    Suspected COVID-19 infection  Last Covid test was negative -2 days ago  -Repeat test is pending  -Maintain appropriate isolation  -Check inflammatory markers if Covid is positive    Uncontrolled hypertension  -Resume home antihypertensives  -Cardiology is on board  -Monitor on telemetry    History of A. Fib, rate controlled  -Resume home Eliquis  -Cardiologist on board  -Monitor on telemetry    History of dyslipidemia  -Resume home statin  -Last lipid panel showed LDL of 89 in September 2020      Nutrition  Consistent carb, cardiac    DVT/VTE Prophylaxis   Eliquis    Anticipated medical stability for discharge 1-2 days    Service status/Reason for ongoing hospitalization: Acute respiratory insufficiency, suspected Covid infection, CHF.  Anticipated Discharge Needs:      Barbaraann Rondo NP    12/07/2018 3:12 PM  Time Elapsed:

## 2018-12-07 NOTE — ED Notes (Signed)
[facility] EMERGENCY DEPARTMENT  ED NURSING NOTE FOR THE RECEIVING INPATIENT NURSE   ED NURSE Lavera Guise [phone]   ED CHARGE RN [phone]   ADMISSION INFORMATION   [name] is a 63 y.o. female admitted with a diagnosis of:    1. SOB (shortness of breath)         Isolation: Contact  and Droplet   Allergies: Shellfish-derived products   Holding Orders confirmed? Yes   Belongings Documented? Yes   Home medications sent to pharmacy confirmed? n/a   NURSING CARE   Patient Comes From:   Mental Status: Home Independent  alert and oriented   ADL: Independent with all ADLs   Ambulation: no difficulty   Medications given in the ED: See ED to IP ISHAPED -> Plan -> MAR Reports   Lines/Drains/Airways: See ED to IP ISHAPED ->Lines/Drains/Airways [place]   Pertinent Information  and Safety Concerns: patient A&^Ox4,k Spanish speaking, has female external catheter.      VITAL SIGNS   Time BP Temp Pulse Resp SpO2   1300 176/79 97.7 78 23 94   CT / NIH   CT Head ordered on this patient?  No   NIH/Dysphagia assessment done prior to admission? n/a   PERSONAL PROTECTIVE EQUIPMENT   Face Shield, Gloves, Procedure Gown and respirator and procedure mask   LAB RESULTS   Labs Reviewed   COMPREHENSIVE METABOLIC PANEL - Abnormal; Notable for the following components:       Result Value    Alkaline Phosphatase 112 (*)     Globulin 3.7 (*)     All other components within normal limits   CBC AND DIFFERENTIAL - Abnormal; Notable for the following components:    WBC 11.00 (*)     Neutrophils Absolute 8.60 (*)     Monocytes Absolute Automated 0.96 (*)     Immature Granulocytes Absolute 0.08 (*)     All other components within normal limits   TROPONIN I - Abnormal; Notable for the following components:    Troponin I 0.07 (*)     All other components within normal limits   B-TYPE NATRIURETIC PEPTIDE - Abnormal; Notable for the following components:    B-Natriuretic Peptide 322 (*)     All other components within normal limits   COVID-19  (SARS-COV-2)    Narrative:     o Collect and clearly label specimen type:  o PREFERRED-Upper respiratory specimen: One Nasopharyngeal  Swab in Transport Media.  o Hand deliver to laboratory ASAP   HEMOLYSIS INDEX   GFR          Ticket to Ride Printed: Yes

## 2018-12-07 NOTE — ED Notes (Signed)
Female external catheter placed to suction with 2L urine return.

## 2018-12-07 NOTE — ED Provider Notes (Addendum)
EMERGENCY DEPARTMENT HISTORY AND PHYSICAL EXAM    PPE: Mask, face shield and gloves were worn every time I entered the room.     Due to language barrier, the appropriate medical translator or service was utilized to ensure accurate information was obtained and communication was unimpeded throughout the emergency department visit.    History of Presenting Illness:  History Provided By: Patient    [name] is a 63 y.o. female pw shortness of breath x1 week.  Patient reports it is been progressive over the past 1 month but suddenly worse for the past 1 week.  She describes chest tightness, dyspnea on exertion and orthopnea.  She saw her cardiologist approximately 1 week ago and her Lasix dose was reduced.  She saw her PMD earlier in the week who obtained blood work, Museum/gallery exhibitions officer and an x-ray.  Later that day, she her Covid test was reportedly negative.    Patient denies fever, coughing, body aches or known sick contacts.    Reviewed and confirmed past medical, surgical, family and social history as documented.    PCP: Castillo-Catoni, Maudry Mayhew, MD  SPECIALISTS: Carmina Miller (Cards)    Review of Systems:  Review of Systems   Constitutional: Negative for chills and fever.   Respiratory: Positive for chest tightness and shortness of breath. Negative for cough.    Cardiovascular: Positive for leg swelling. Negative for chest pain.   All other systems reviewed as negative.    Physical Exam:  Vitals:    12/07/18 1114 12/07/18 1200 12/07/18 1300   BP: 168/71 (!) 188/116 176/79   Pulse: 74 81 78   Resp: (!) 23 (!) 30 (!) 23   Temp: 97.7 F (36.5 C)     TempSrc: Oral     SpO2: 96% 92% 94%   Weight: 83.5 kg     Height: 5\' 5"  (1.651 m)       Pulse Oximetry Interpretation: Normal     Physical Exam   Constitutional: Patient is alert.  Well nourished.  NAD  Head: Atraumatic.   Eyes: EOMI. PERRL  ENT:  MMM.   Neck:  FROM. No spinal tenderness. Neck supple.    Cardiovascular: Normal rate and irregular rhythm. Systolic murmur.    Pulmonary/Chest: Tachypnea and breath sounds rales and wheeze.   Abdominal: Soft. There is no tenderness. Bowel sounds present and normal.    Musculoskeletal:  Bilateral trace pitting edema, pretibial.   Neurological: Patient is alert and oriented to person, place, and time.  No focal deficits.   Skin: Skin is warm and dry.    Cardiac Monitor:  Rate: 80s  Rhythm:  Atrial Fibrillation     EKG:  Interpreted by the EP.   Time Interpreted: 1110 AM   Rate: 88   Rhythm: Atrial Fibrillation    Interpretation: No ST/TW changes, inf q-waves.    Comparison: Predominately unchanged from 10/2017    Old Medical Records: Old medical records.  Previous electrocardiograms.  Nursing notes.  Previous radiology studies.    Patient Update Notes:       Provider Notes: Shortness of breath x1 month and worse x1 week.  Chest x-ray with interstitial infiltrates, acute on chronic.  Troponin and BNP mildly elevated.  Lasix.  Covid test pending.    Urine cx from 10/27 - positive for e coli 100k.     Clinical Impression:   1. SOB (shortness of breath)    2. Acute UTI    3. Elevated brain natriuretic peptide (  BNP) level    4. Elevated troponin    5. Respiratory distress        ED Disposition     ED Disposition Condition Date/Time Comment    Observation  Fri Dec 07, 2018  1:40 PM Admitting Physician: Clarita Leber [[id number]]   Service:: Medicine [[id number]]   Estimated Length of Stay: < 2 midnights   Tentative Discharge Plan?: Home or Self Care [1]   Does patient need telemetry?: Yes   Telemetry type (separate Telemetry order is also required):: Cardiac telemetry   Isolation and Infection:: Contact   Isolation and Infection:: Droplet          CRITICAL CARE: The high probability of sudden, clinically significant deterioration in the patient's condition required the highest level of my preparedness to intervene urgently.    The services I provided to this patient were to treat and/or prevent clinically significant deterioration that could result  in: mortality.  Services included the following: chart data review, reviewing nursing notes and/or old charts, documentation time, consultant collaboration regarding findings and treatment options, medication orders and management, direct patient care, re-evaluations, vital sign assessments and ordering, interpreting and reviewing diagnostic studies/lab tests.    Aggregate critical care time was 35 minutes, which includes only time during which I was engaged in work directly related to the patient's care, as described above, whether at the bedside or elsewhere in the Emergency Department.  It did not include time spent performing other reported procedures or the services of residents, students, nurses or physician assistants.      This note was generated by the Epic EMR system/ Dragon speech recognition and may contain inherent errors or omissions not intended by the user. Grammatical errors, random word insertions, deletions and pronoun errors  are occasional consequences of this technology due to software limitations. Not all errors are caught or corrected. If there are questions or concerns about the content of this note or information contained within the body of this dictation they should be addressed directly with the author for clarification          Tenny Craw, MD  12/07/18 1420

## 2018-12-08 DIAGNOSIS — I4891 Unspecified atrial fibrillation: Secondary | ICD-10-CM

## 2018-12-08 DIAGNOSIS — E119 Type 2 diabetes mellitus without complications: Secondary | ICD-10-CM

## 2018-12-08 DIAGNOSIS — R918 Other nonspecific abnormal finding of lung field: Secondary | ICD-10-CM

## 2018-12-08 LAB — CBC
Absolute NRBC: 0 10*3/uL (ref 0.00–0.00)
Hematocrit: 39.7 % (ref 34.7–43.7)
Hgb: 13.4 g/dL (ref 11.4–14.8)
MCH: 30.4 pg (ref 25.1–33.5)
MCHC: 33.8 g/dL (ref 31.5–35.8)
MCV: 90 fL (ref 78.0–96.0)
MPV: 11.3 fL (ref 8.9–12.5)
Nucleated RBC: 0 /100 WBC (ref 0.0–0.0)
Platelets: 224 10*3/uL (ref 142–346)
RBC: 4.41 10*6/uL (ref 3.90–5.10)
RDW: 13 % (ref 11–15)
WBC: 9.07 10*3/uL (ref 3.10–9.50)

## 2018-12-08 LAB — ECG 12-LEAD
Atrial Rate: 27 {beats}/min
Q-T Interval: 394 ms
QRS Duration: 84 ms
QTC Calculation (Bezet): 476 ms
R Axis: 80 degrees
T Axis: -36 degrees
Ventricular Rate: 88 {beats}/min

## 2018-12-08 LAB — GLUCOSE WHOLE BLOOD - POCT
Whole Blood Glucose POCT: 214 mg/dL — ABNORMAL HIGH (ref 70–100)
Whole Blood Glucose POCT: 173 mg/dL — ABNORMAL HIGH (ref 70–100)
Whole Blood Glucose POCT: 148 mg/dL — ABNORMAL HIGH (ref 70–100)
Whole Blood Glucose POCT: 124 mg/dL — ABNORMAL HIGH (ref 70–100)

## 2018-12-08 LAB — BASIC METABOLIC PANEL
Anion Gap: 11 (ref 5.0–15.0)
BUN: 15 mg/dL (ref 7.0–19.0)
CO2: 26 mEq/L (ref 22–29)
Calcium: 8.5 mg/dL (ref 8.5–10.5)
Chloride: 104 mEq/L (ref 100–111)
Creatinine: 0.8 mg/dL (ref 0.6–1.0)
Glucose: 187 mg/dL — ABNORMAL HIGH (ref 70–100)
Potassium: 3.7 mEq/L (ref 3.5–5.1)
Sodium: 141 mEq/L (ref 136–145)

## 2018-12-08 LAB — URINE CULTURE

## 2018-12-08 LAB — COVID-19 (SARS-COV-2)

## 2018-12-08 LAB — HEMOLYSIS INDEX: Hemolysis Index: 13 (ref 0–18)

## 2018-12-08 LAB — TROPONIN I: Troponin I: 0.02 ng/mL (ref 0.00–0.05)

## 2018-12-08 LAB — SARS-COV-2 (COVID-19) RNA, PCR: SARS-CoV-2 Overall Result: NOT DETECTED

## 2018-12-08 LAB — GFR: EGFR: >60

## 2018-12-08 MED ORDER — LATANOPROST 0.005 % OP SOLN
1.00 [drp] | Freq: Two times a day (BID) | OPHTHALMIC | Status: DC
Start: 2018-12-08 — End: 2018-12-09
  Administered 2018-12-08: 1 [drp] via OPHTHALMIC
  Filled 2018-12-08: qty 1

## 2018-12-08 NOTE — Plan of Care (Signed)
Problem: Safety  Goal: Patient will be free from injury during hospitalization  Outcome: Progressing  Goal: Patient will be free from infection during hospitalization  Outcome: Progressing     Problem: Pain  Goal: Pain at adequate level as identified by patient  Outcome: Progressing     Problem: Side Effects from Pain Analgesia  Goal: Patient will experience minimal side effects of analgesic therapy  Outcome: Progressing     Problem: Discharge Barriers  Goal: Patient will be discharged home or other facility with appropriate resources  Outcome: Progressing     Problem: Psychosocial and Spiritual Needs  Goal: Demonstrates ability to cope with hospitalization/illness  Outcome: Progressing     Problem: Compromised Hemodynamic Status  Goal: Vital signs and fluid balance maintained/improved  Outcome: Progressing     Problem: Infection  Goal: Free from infection  Outcome: Progressing     Problem: Inadequate Tissue Perfusion  Goal: Adequate tissue perfusion will be maintained  Outcome: Progressing     Problem: Ineffective Airway Clearance  Goal: Airway is maintained  Outcome: Progressing     Problem: Impaired Mobility  Goal: Mobility/Activity is maintained at optimal level for patient  Outcome: Progressing     Problem: Everyday - Heart Failure  Goal: Stable Vital Signs and Fluid Balance  Outcome: Progressing  Goal: Mobility/Activity is Maintained at Optimal Level for Patient  Outcome: Progressing  Goal: Nutritional Intake is Adequate  Outcome: Progressing  Goal: Teaching-Using CHF Warning Zones and Educational Videos  Outcome: Progressing

## 2018-12-08 NOTE — Progress Notes (Signed)
Patient asked to keep eye drops from home in fridge. Eye drops in medication room fridge labeled

## 2018-12-08 NOTE — UM Notes (Signed)
CSR 12/08/18        RN NOTE: HX of COPD, A-Fib, HTN, Hyperlipidemia, DM   Presented to ED with SOB   DX: Acute CHF, Elevated Troponin, R/O Covid, Acute Resp Insufficiency, Uncontrolled Hypertension.     Plan   O2 support   Lasix   Albuterol Inhaler   IS   Monitor Resp Status   Cardiology Consult   Home meds   Daily Weight   Awaiting Covid result     Whole Blood Glucose POCT 214High  mg/dL     Basic Metabolic Panel [[id number]] (Abnormal) Collected: 12/08/18 0234   Specimen: Blood Updated: 12/08/18 0319    Glucose 187High  mg/dL        Troponin I [[id number]] Collected: 12/07/18 1619   Specimen: Blood Updated: 12/07/18 1657    Troponin I 0.02 ng/mL        Troponin I [[id number]] (Abnormal) Collected: 12/07/18 1203   Specimen: Blood Updated: 12/07/18 1237    Troponin I 0.07High  ng/mL    B-type Natriuretic Peptide [[id number]] (Abnormal) Collected: 12/07/18 1203   Specimen: Blood Updated: 12/07/18 1237    B-Natriuretic Peptide 322High  pg/mL       WBC 11.00High  x10 3/uL      Urine Culture, Routine Final reportAbnormal     Result 1: Escherichia coliAbnormal      Vital Sign Min/Max (last 24 hours)    Value Min Max   Temp 97.7 F (36.5 C) 98.4 F (36.9 C)   Heart Rate 65 93   Resp Rate 18 37Abnormal    BP: Systolic 125 188   BP: Diastolic 71 116Abnormal    SpO2 92 % 97 %     CXR:  Impression:      Diffuse bilateral interstitial and airspace opacities is   partly related to chronic lung disease however superimposed acute   process such as pneumonia or pulmonary edema is suspected.          H&P:  ASSESSMENT & PLAN     [name] is a 63 y.o. female admitted with SOB (shortness of breath).    Patient Active Hospital Problem List:  Acute respiratory insufficiency (12/07/2018)    Acute CHF  Elevated troponin  Suspected COVID-19 infection  Uncontrolled hypertension  Leukocytosis  History of A. fib on Eliquis  History of diabetes mellitus type 2 in obese   History of dyslipidemia       plan:  Acute respiratory  insufficiency: Likely due to viral infection versus CHF  Pneumonia versus pulmonary edema  Suspected COVID-19 infection  -Chest x-ray reviewed and noted  -COVID-19 test is pending  -Continue Lasix  -Continue oxygen support  -Add albuterol inhaler for shortness of breath and low-dose steroid  -Continue incentive spirometry  -Closely monitor respiratory status    Acute CHF exacerbation  Elevated troponin: Likely troponin leak, will rule out ACS  IMG cardiologist on board  -Patient's primary cardiologist is Dr. Fransisco Beau  -Patient's daughter reports recent ECHO in Dr. Marcha Solders office   -Last echo in epic done September 02, 2015 showed normal systolic function with EF of 60%  -Resume home beta-blocker, lisinopril  -Daily weight  -Cardiac diet    History of diabetes  -Add correctional insulin  -Diabetic diet  -Last hemoglobin A1c in September 2020 noted at 7.6    Obstructive CAD  Elevated troponin  -Cardiologist on board  -Last echocardiogram done September 2020 showed PA pressure increased to 56 right ventricular enlargement, mild to moderate tricuspid  regurgitation with normal LV function.  -Continue to trend troponins  -Admit to telemetry and monitor closely  -Further recommendations per cardiology.    Suspected COVID-19 infection  Last Covid test was negative -2 days ago  -Repeat test is pending  -Maintain appropriate isolation  -Check inflammatory markers if Covid is positive    Uncontrolled hypertension  -Resume home antihypertensives  -Cardiology is on board  -Monitor on telemetry    History of A. Fib, rate controlled  -Resume home Eliquis  -Cardiologist on board  -Monitor on telemetry    History of dyslipidemia  -Resume home statin  -Last lipid panel showed LDL of 89 in September 2020      Nutrition  Consistent carb, cardiac    DVT/VTE Prophylaxis   Eliquis    Anticipated medical stability for discharge 1-2 days    Service status/Reason for ongoing hospitalization: Acute respiratory insufficiency, suspected Covid  infection, CHF.  Anticipated Discharge Needs:        cefTRIAXone (ROCEPHIN) 1 g in sodium chloride 0.9 % 100 mL IVPB mini-bag plus   Dose: 1 g  Freq: Once Route: IV    furosemide (LASIX) injection 40 mg   Dose: 40 mg  Freq: Daily Route: IV        STRICT I&OS    TELE  Daily wt    ON ADMISSION:   12/07/18 1200 -- -- -- 81 92 % -- 30 188/116

## 2018-12-08 NOTE — Plan of Care (Signed)
Pt has denied chest pain all shift, she has been SOB with exertion, bedrest at this time with ext cath hooked for accurate I&O. No distress noted

## 2018-12-08 NOTE — Progress Notes (Signed)
Situation Patient was admitted under Observation Status d/t Respiratory distress; SOB; Elevated troponin.  Assessment completed with patient's daughter Sherwood Gambler by telephone due to Spanish Language translation needs.     Background Patient was admitted from home where she resides with her daughter.  Patient is independent and works.  PCP and insurance verified.  No advance directive.  Daughter is Advertising account executive.     Assessment Covid-19 Not Detected.  Acute respiratory insufficiency resolved; acute CHF exacerbation; H/O DM; obstructive CAD; uncontrolled HTN.  Medicine, Cardiology following.     Recommendation DCP: Home, no needs.  Daughter to transport.           12/08/18 1627   Patient Type   Within 30 Days of Previous Admission? No   Healthcare Decisions   Interviewed: Family   Name of interviewee if other than the pt: Sherwood Gambler, Daughter.   Interviewee Contact Information: [phone]   Orientation/Decision Making Abilities of Patient Alert and Oriented x3, able to make decisions   Advance Directive Patient does not have advance directive   Healthcare Agent Appointed Yes  (Daughter.)   Prior to admission   Prior level of function Independent with ADLs   Type of Residence Private residence   Home Layout Other (Comment)  (Patient resides with her daughter in an apartment.)   Have running water, electricity, heat, etc? Yes   Living Arrangements Children   How do you get to your MD appointments? Self/Daughter   How do you get your groceries? Self   Who fixes your meals? Self   Who does your laundry? Self   Who picks up your prescriptions? Self   Dressing Independent   Grooming Independent   Feeding Independent   Bathing Independent   Toileting Independent   Adult Protective Services (APS) involved? No   Discharge Planning   Support Systems Children   Patient expects to be discharged to: Home   Anticipated [facility] plan discussed with: Same as interviewed   Mode of transportation: Private car (family  member)   Does the patient have perscription coverage? Yes   Consults/Providers   PT Evaluation Needed 2   OT Evalulation Needed 2   SLP Evaluation Needed 2   Outcome Palliative Care Screen Screened but did not meet criteria for intervention   Correct PCP listed in Epic? Yes   Family and PCP   In case you are admitted, would like family notified?   (Family is aware of hospitalization.)   In case you are admitted, would like your PCP notified? Unknown   PCP on file was verified as the current PCP? Yes   Important Message from Medicare Notice   Patient received 1st IMM Letter? n/a     Lynnell Catalan, MSW  Social Worker Case Manager I  [facility]  [phone]

## 2018-12-08 NOTE — Progress Notes (Signed)
[facility] Progress Note       Assessment and Plan:     1. Presumed component of diastolic HF    - Recent nuclear stress test Oct 2020 negative for ischemia    -- Echo with preserved EF (Oct 2020)    -- PHTN, improved to PASP 30's    -- HTN    -- DM    -- AF on Eliquis    Seen by cardiologist a few weeks ago with similar complaints.    CXR markedly abnormal.  She has had a robust diuresis thus far to 40 mg IV Lasix.  Taking HCTZ as OP.    Plan:  Diuresis as before 20 mg IV BID (negative 1600 noted)  Repeat CXR; eventual transition to oral lasix  Rule out COVID  Back on her Lisinopril and nifedipine  Continue beta blocker  Daily weights and strict I/s and O's  Monitor renal function (normal)  Telemetry    Patient Active Problem List    Diagnosis Date Noted   . *HSOB (shortness of breath) [R06.02] 12/07/2018   . HAcute respiratory insufficiency [R06.89] 12/07/2018   . Hyperkalemia [E87.5] 09/02/2015   . Cystocele [IMO0002] 04/08/2015   . Venous insufficiency [I87.2] 09/17/2014   . Non-rheumatic mitral regurgitation [I34.0] 02/14/2014   . Diabetic retinopathy [E11.319] 06/28/2013   . Elevated liver enzymes [R74.8] 06/18/2012   . Fatty liver [K76.0] 06/18/2012   . Glaucoma [H40.9] 06/01/2012   . HDiabetes mellitus type 2 in obese [E11.69, E66.9]    . HHypertension [I10]    . HDyslipidemia [E78.5]    . Obesity [E66.9]              Subjective:   Denies chest pain, SOB, palpitations      Physical Exam:     Vitals:    12/08/18 0840   BP: 125/75   Pulse: 80   Resp:    Temp:    SpO2:      Temp (24hrs), Avg:98.1 F (36.7 C), Min:97.7 F (36.5 C), Max:98.4 F (36.9 C)    Weight: 81 kg (178 lb 9.6 oz)    Intake and Output Summary (Last 24 hours) at Date Time    Intake/Output Summary (Last 24 hours) at 12/08/2018 1043  Last data filed at 12/08/2018 0254  Gross per 24 hour   Intake --   Output 1600 ml   Net -1600 ml       General appearance - alert, well appearing, and in no distress  Neck -  supple, Carotids 2+, no bruit, no JVD  Chest - clear to auscultation, no wheezes, rales or rhonchi, symmetric air entry  Heart - normal rate and regular rhythm, S1 and S2 normal, no murmurs, PMI not palpable  Abdomen - soft, nontender, nondistended, no masses or organomegaly  Extremities - peripheral pulses normal, no pedal edema, no clubbing or cyanosis    Medications:     Current Facility-Administered Medications   Medication Dose Route Frequency   . apixaban  5 mg Oral Q12H   . atorvastatin  20 mg Oral QHS   . famotidine  20 mg Oral Q12H SCH    Or   . famotidine  20 mg Intravenous Q12H SCH   . furosemide  40 mg Intravenous Daily   . insulin lispro  1-4 Units Subcutaneous QHS   . insulin lispro  1-8 Units Subcutaneous TID AC   . insulin NPH  50 Units Subcutaneous BID AC   . lisinopril  40 mg Oral Daily   . metoprolol succinate XL  25 mg Oral QPM   . NIFEdipine  60 mg Oral Daily       Labs:   Recent CMP   Recent Labs     12/08/18  0234  12/07/18  1203   Glucose 187*   < > 100   BUN 15.0   < > 14.0   Creatinine 0.8   < > 0.7   Sodium 141   < > 141   Potassium 3.7   < > 4.1   CO2 26   < > 25   Calcium 8.5   < > 9.4   Albumin  --   --  4.1   AST (SGOT)  --   --  27   ALT  --   --  33   Globulin  --   --  3.7*    < > = values in this interval not displayed.     Recent CARDIAC ENZYMES No results for input(s): TROPONIN, ISTATTROPONI, CK in the last 24 hours.    Invalid input(s): TROPONINT, CKMB[24  Recent TSH Invalid input(s): TSH[24  Recent PT/PTT No results for input(s): INR, PTT in the last 24 hours.    Invalid input(s): PTI, COUM, COUMP, ACOAG, ACOAP[24  Recent CBC WITH DIFF   Recent Labs     12/08/18  0234   RBC 4.41   Hgb 13.4   Hematocrit 39.7   MCV 90.0   MCHC 33.8   RDW 13   MPV 11.3   Platelets 224     Recent LIPID PANEL No results for input(s): CHOL, TRIG, HDL in the last 24 hours.    Invalid input(s): LDLC, VLDLC, LRAT[24    Rads:     Radiology Results (24 Hour)     Procedure Component Value Units Date/Time     Chest AP Portable [[id number]] Collected: 12/07/18 1140    Order Status: Completed Updated: 12/07/18 1145    Narrative:      HISTORY: chest pain    TECHNIQUE: Single AP view of the chest.    COMPARISON: 09/02/2015.    FINDINGS: Diffuse bilateral residual airspace opacities are present.  Stable cardiac and mediastinal contours with evidence of cardiomegaly.  No pleural effusion or pneumothorax demonstrated.      Impression:       Diffuse bilateral interstitial and airspace opacities is  partly related to chronic lung disease however superimposed acute  process such as pneumonia or pulmonary edema is suspected.    Gustavus Messing, MD   12/07/2018 11:43 AM          Cardiographics:   EKG:  Telemetry:

## 2018-12-08 NOTE — Discharge Summary (Signed)
Discharge Summary    Date:12/08/2018   Patient Name: [name]  Attending Physician: Clarene Critchley, MD    Date of Admission:   12/07/2018    Date of Discharge:   12/09/2018    Admitting Diagnosis:   Shortness of breath unknown etiology    Discharge Dx:   Acute exacerbation of CHF    Treatment Team:   Treatment Team:   Attending Provider: Clarene Critchley, MD     Procedures performed:   Radiology: all results from this admission  Chest Ap Portable    Result Date: 12/07/2018   Diffuse bilateral interstitial and airspace opacities is partly related to chronic lung disease however superimposed acute process such as pneumonia or pulmonary edema is suspected. Gustavus Messing, MD  12/07/2018 11:43 AM      Reason for Admission:   Shortness of breath unknown etiology    Hospital Course:     This is a 63 year old female with past medical history remarkable for obstructive CAD, A. fib on Eliquis, hypertension, hyperlipidemia, type 2 diabetes who is followed by Dr. Fransisco Beau cardiology presented to the hospital with worsening shortness of breath for few weeks which has progressively worsened.  She stated on admission that she has not been able to tolerate walking short distances, she has not been able to lay flat in bed or climb up the stairs.  She said that she recently had changes done to her medications by her cardiologist and since then she has been feeling worse.    In the emergency room her BNP was 322, troponin 0 0.07 and chest x-ray showed suspicion for pneumonia versus pulmonary edema. She was admitted for further care.  In the hospital she was treated with aggressive diuresis and she instantly did well she was on IV Lasix 20 mg twice daily.  She diuresed around 1.6 L on the first day.  The exact INO's for the next day when not were recorded.  However she significantly felt better thereafter.  On the day of discharge she was feeling much much better and back to her baseline.  She will be discharged on oral Lasix  40 mg daily.  [name] told her to follow-up with her cardiologist within the next few days and write down her daily weights so that the Lasix can be adjusted accordingly.      She will not need to get further testing because she had a negative stress test as of October 2020 and she also had a echo that showed preserved EF as of October 2020.    Condition at Discharge:   Good    Today:     BP 122/72   Pulse 88   Temp 97.6 F (36.4 C) (Oral)   Resp 16   Ht 1.651 m (5\' 5" )   Wt 81 kg (178 lb 9.6 oz)   SpO2 97%   BMI 29.72 kg/m   Ranges for the last 24 hours:  Temp:  [97.6 F (36.4 C)-98.4 F (36.9 C)] 97.6 F (36.4 C)  Heart Rate:  [65-88] 88  Resp Rate:  [16-22] 16  BP: (119-158)/(69-81) 122/72    Last set of labs   Recent Labs   Lab 12/09/18  0423   WBC 9.11   Hgb 13.7   Hematocrit 40.8   Platelets 254     Recent Labs   Lab 12/09/18  0423   Sodium 141   Potassium 3.4*   Chloride 106   CO2 24   BUN 24.0*  Creatinine 0.7   EGFR >60.0   Glucose 63*   Calcium 8.6     Recent Labs   Lab 12/07/18  1203   Bilirubin, Total 1.0   Protein, Total 7.8   Albumin 4.1   ALT 33   AST (SGOT) 27         Recent Labs   Lab 12/07/18  1203   TSH 0.98         Recent Labs   Lab 12/08/18  0234 12/07/18  1619 12/07/18  1203   Troponin [name] 0.02 0.02 0.07*     Microbiology Results     Procedure Component Value Units Date/Time    COVID-19 (SARS-COV-2) ([place] Standard test) [[id number]] Collected: 12/07/18 1203    Specimen: Nasopharyngeal Swab from Nasopharynx Updated: 12/08/18 1126     Purpose of COVID testing Diagnostic -PUI     SARS-CoV-2 Specimen Source Nasopharyngeal     SARS CoV 2 Overall Result Not Detected     Comment: Test performed using the Abbott m2000 SARS-CoV-2 EUA Test.  Please see Fact Sheets for patients and providers located at:  http://www.rice.biz/  This test is for the qualitative detection of SARS-CoV-2(COVID-19)  nucleic acid.  Viral nucleic acids may persist in vivo, independent of  viability.  Detection of viral nucleic acid does not imply the presence of  infectious virus, or that virus nucleic acid is the cause of  clinical symptoms.  Test performance has not been established for  immunocompromised patients or patients without signs and symptoms of  respiratory infection. Negative results do not preclude SARS-CoV-2  infection and should not be used as the sole basis for patient  management decisions.  Invalid results may be due to inhibiting substances in the specimen  and recollection should occur.         Narrative:      o Collect and clearly label specimen type:  o PREFERRED-Upper respiratory specimen: One Nasopharyngeal  Swab in Transport Media.  o Hand deliver to laboratory ASAP            Micro / Labs / Path pending:     Unresulted Labs     Procedure . . . Date/Time    Troponin [name] [[id number]] Collected: 12/08/18 0234    Specimen: Blood Updated: 12/08/18 0235          Discharge Instructions For Providers     1. Check her weight logs and adjust Lasix accordingly    Discharge Instructions:       Discharge Diet: Cardiac Diet        Peripheral IV 12/07/18 20 G Right Hand (Active)   Site Assessment Clean;Dry;Intact 12/09/18 0300   Line Status Saline Locked 12/09/18 0300   Dressing Status Clean;Dry;Intact 12/09/18 0300   Dressing Change Due 12/15/18 12/09/18 0300   Reason Not Rotated Not due 12/09/18 0300   Number of days: 1        Immunization History   Administered Date(s) Administered   . Influenza (Im) Preserved TRIVALENT VACCINE 01/23/2012   . Influenza Vacc QUAD Recombinant PF 11yrs & up 12/25/2017, 10/22/2018   . Influenza quadrivalent (IM) 6 months & up PRESERVED 12/28/2012, 12/18/2014   . Influenza quadrivalent (IM) PF 3 Yrs & greater 10/31/2013   . Tdap 07/29/2013     Extended Emergency Contact Information  Primary Emergency Contact: Maldonado,Miriam  Address: [address]           [address]  [place], [place] [address] [place]  Home Phone:  [phone]  Mobile Phone: [phone]  Relation: Daughter  Full Code  Activity/Weight bearing status: As tolerated    Discharge References/Attachments    Assessing Your Heart, Heart Failure (Spanish)  Heart Failure, Discharge Instructions for (Spanish)  Heart Failure: Taking Medicine to Control (Spanish)  Heart Failure: Tracking Your Weight (Spanish)         Disposition:  Home or Self Care     Discharge Medication List      Taking    Accu-Chek Aviva Plus w/Device Kit  Check blood glucose level twice daily. ICD 10 Code E11.9, Z79.4     accu-chek soft touch lancets  Check blood glucose level twice daily. ICD 10 Code E11.9, Z79.4     atorvastatin 20 MG tablet  Dose: 20 mg  Commonly known as: LIPITOR  Take 1 tablet (20 mg total) by mouth daily     CALCIUM-D PO  Dose: 1 tablet  Take 1 tablet by mouth daily.     Eliquis 5 MG  Dose: 5 mg  Generic drug: apixaban  Take 5 mg by mouth every 12 (twelve) hours.     furosemide 20 MG tablet  Dose: 40 mg  Commonly known as: LASIX  Take 2 tablets (40 mg total) by mouth daily     glucose blood test strip  Commonly known as: Accu-Chek Aviva  Check blood glucose level twice daily. ICD 10 Code E11.9, Z79.4     hydroCHLOROthiazide 12.5 MG tablet  Dose: 12.5 mg  Commonly known as: HYDRODIURIL  Take 12.5 mg by mouth daily      Insulin Syringe-Needle U-100 31G X 5/16" 1 ML Misc  What changed: Another medication with the same name was removed. Continue taking this medication, and follow the directions you see here.  Commonly known as: BD INSULIN SYRINGE ULTRAFINE  ADMINISTER INSULIN SUBCUATANEOUSLY TWICE A DAY     lisinopril 40 MG tablet  Dose: 40 mg  Commonly known as: ZESTRIL  Take 1 tablet (40 mg total) by mouth daily     metFORMIN 1000 MG tablet  Dose: 1,000 mg  Commonly known as: GLUCOPHAGE  Take 1 tablet (1,000 mg total) by mouth 2 (two) times daily with meals     metoprolol succinate XL 50 MG 24 hr tablet  Dose: 25 mg  Commonly known as: TOPROL-XL  Take 25 mg by mouth every evening       NIFEdipine 60 MG 24 hr tablet  Dose: 60 mg  Commonly known as: PROCARDIA XL  Take 60 mg by mouth daily      NovoLIN N 100 UNIT/ML injection  Generic drug: insulin NPH  INJECT 50 UNITS SUBCUTANEOUSLY WITH BREAKFST AND 24 UNITS WITH DINNER     Travatan Z 0.004 % Soln ophthalmic solution  Generic drug: travoprost          Minutes spent coordinating discharge and reviewing discharge plan: 30 minutes      Signed by: Kennadee Walthour Larose Hires, MD

## 2018-12-08 NOTE — Progress Notes (Signed)
[facility]  PROGRESS NOTE      Patient: [name]  Date: 12/08/2018   LOS: 0 Days  Admission Date: 12/07/2018   MRN: [id number]  Attending: Clarene Critchley  Please contact me on the following Spectralink [phone]       ASSESSMENT/PLAN     [name] is a 63 y.o. female admitted with SOB (shortness of breath)    Interval Summary:     Patient Active Hospital Problem List:    [name] is a 63 y.o. female admitted with SOB (shortness of breath).    Acute respiratory insufficiency:  Off nasal oxygen breathing at 97% on room air  Resolved    Most likely secondary to CHF exacerbation  Will treat underlying cause    Acute CHF exacerbation:  Patient's primary cardiologist is Dr. Fransisco Beau  Patient's daughter reports recent ECHO in Dr. Marcha Solders office   Last echo in epic done September 02, 2015 showed normal systolic function with EF of 60%  Currently on lasix 40 mg daily  Continue I's and O's "put out 1.6 L so far"  Continue daily weights  Will resume lisinopril 40 mg daily and Toprol XL 25 mg daily    History of diabetes:  Glucose 100--68--187  Add correctional insulin  Diabetic diet  Last hemoglobin A1c in September 2020 noted at 7.6    Obstructive CAD:  No CP or SOB as of now  Elevated troponin 0.07--0.02-0.02  Most likely secondary to CHF exacerbation and Acute respiratory failue  Cardiologist on board  Last echocardiogram done September 2020 showed PA pressure increased to 56 right ventricular enlargement, mild to moderate tricuspid regurgitation with normal LV function.  Stress test negative as of oct 2020    Rule out COVID-19 infection:  Last Covid test was negative -2 days ago  Repeat test is pending  Maintain appropriate isolation  Check inflammatory markers if Covid is positive    Uncontrolled hypertension:  Well controlled this morning   Resume home antihypertensives  Cardiology is on board  Monitor on telemetry    History of A. Fib:  Rate controlled  Resume home Eliquis  Cardiologist on  board  Monitor on telemetry    History of dyslipidemia:  Resume Lipitor 20 mg daily  Last lipid panel showed LDL of 89 in September 2020    Nutrition : Consistent carb, cardiac    DVT/VTE Prophylaxis: Eliquis      Analgesia: Tylenol    Nutrition: Diabetic    DVT Prophylaxis: Eliquis 5 mg every 12 horus       Code Status: FULL    DISPO: Inpatient     Family Contact: none       SUBJECTIVE     [name] states that she feels much better compared to when she came in. She denies any SOB or chest pain and feels fine.     MEDICATIONS     Current Facility-Administered Medications   Medication Dose Route Frequency   . apixaban  5 mg Oral Q12H   . atorvastatin  20 mg Oral QHS   . famotidine  20 mg Oral Q12H SCH    Or   . famotidine  20 mg Intravenous Q12H SCH   . furosemide  40 mg Intravenous Daily   . insulin lispro  1-4 Units Subcutaneous QHS   . insulin lispro  1-8 Units Subcutaneous TID AC   . insulin NPH  50 Units Subcutaneous BID AC   . lisinopril  40 mg Oral Daily   . metoprolol succinate XL  25 mg Oral QPM   . NIFEdipine  60 mg Oral Daily       PHYSICAL EXAM     Vitals:    12/08/18 1206   BP: 119/69   Pulse: 80   Resp: 18   Temp: 98.2 F (36.8 C)   SpO2: 97%       Temperature: Temp  Min: 98.1 F (36.7 C)  Max: 98.4 F (36.9 C)  Pulse: Pulse  Min: 65  Max: 93  Respiratory: Resp  Min: 18  Max: 37  Non-Invasive BP: BP  Min: 119/69  Max: 179/80  Pulse Oximetry SpO2  Min: 93 %  Max: 97 %    Intake and Output Summary (Last 24 hours) at Date Time    Intake/Output Summary (Last 24 hours) at 12/08/2018 1248  Last data filed at 12/08/2018 0254  Gross per 24 hour   Intake --   Output 1600 ml   Net -1600 ml     GEN APPEARANCE: Normal;  A&OX3  HEENT: PERLA; EOMI; Conjunctiva Clear  NECK: Supple; No bruits  CVS: RRR, S1, S2; No M/G/R  LUNGS: CTAB;  Bilateral rales present   ABD: Soft; No TTP  EXT: No edema  NEURO: No Focal neurological deficits  CAP REFILL:  Normal  MENTAL STATUS:  Normal    Exam done by Clarene Critchley, MD on 12/08/18 at 12:48 PM      LABS     Recent Labs   Lab 12/08/18  0234 12/07/18  1203 12/04/18  0000   WBC 9.07 11.00* 9.3   RBC 4.41 4.52 4.23   Hemoglobin  --   --  12.8   Hgb 13.4 13.7  --    Hematocrit 39.7 41.2 37.4   MCV 90.0 91.2 88   Platelets 224 245 263       Recent Labs   Lab 12/08/18  0234 12/07/18  1619 12/07/18  1203 12/04/18  0000   Sodium 141 142 141 142   Potassium 3.7 3.8 4.1 4.4   Chloride 104 103 106 105   CO2 26 25 25 24    BUN 15.0 12.0 14.0 15   Creatinine 0.8 0.7 0.7 0.83   Glucose 187* 68* 100 118*   Calcium 8.5 9.5 9.4 9.7   Magnesium  --  1.7  --   --        Recent Labs   Lab 12/07/18  1203 12/04/18  0000   ALT 33 21   AST (SGOT) 27 23   Bilirubin, Total 1.0 0.3   Albumin 4.1 4.4   Alkaline Phosphatase 112* 103       Recent Labs   Lab 12/08/18  0234 12/07/18  1619 12/07/18  1203   Troponin I 0.02 0.02 0.07*             Microbiology Results     Procedure Component Value Units Date/Time    COVID-19 (SARS-COV-2) ([place] Standard test) [[id number]] Collected: 12/07/18 1203    Specimen: Nasopharyngeal Swab from Nasopharynx Updated: 12/08/18 1126     Purpose of COVID testing Diagnostic -PUI     SARS-CoV-2 Specimen Source Nasopharyngeal     SARS CoV 2 Overall Result Not Detected     Comment: Test performed using the Abbott m2000 SARS-CoV-2 EUA Test.  Please see Fact Sheets for patients and providers located at:  http://www.rice.biz/  This test is for the qualitative detection of SARS-CoV-2(COVID-19)  nucleic acid.  Viral nucleic acids may persist in vivo, independent of viability.  Detection of viral nucleic acid does not imply the presence of  infectious virus, or that virus nucleic acid is the cause of  clinical symptoms.  Test performance has not been established for  immunocompromised patients or patients without signs and symptoms of  respiratory infection. Negative results do not preclude SARS-CoV-2  infection and should not be used as the sole basis for  patient  management decisions.  Invalid results may be due to inhibiting substances in the specimen  and recollection should occur.         Narrative:      o Collect and clearly label specimen type:  o PREFERRED-Upper respiratory specimen: One Nasopharyngeal  Swab in Transport Media.  o Hand deliver to laboratory ASAP           RADIOLOGY     Upon my review: As per radiology     Signed,  Clarene Critchley  12:48 PM 12/08/2018

## 2018-12-09 ENCOUNTER — Encounter (INDEPENDENT_AMBULATORY_CARE_PROVIDER_SITE_OTHER): Payer: Self-pay | Admitting: Internal Medicine

## 2018-12-09 LAB — BASIC METABOLIC PANEL
Anion Gap: 11 (ref 5.0–15.0)
BUN: 24 mg/dL — ABNORMAL HIGH (ref 7.0–19.0)
CO2: 24 mEq/L (ref 22–29)
Calcium: 8.6 mg/dL (ref 8.5–10.5)
Chloride: 106 mEq/L (ref 100–111)
Creatinine: 0.7 mg/dL (ref 0.6–1.0)
Glucose: 63 mg/dL — ABNORMAL LOW (ref 70–100)
Potassium: 3.4 mEq/L — ABNORMAL LOW (ref 3.5–5.1)
Sodium: 141 mEq/L (ref 136–145)

## 2018-12-09 LAB — CBC AND DIFFERENTIAL
Absolute NRBC: 0 10*3/uL (ref 0.00–0.00)
Basophils Absolute Automated: 0.05 10*3/uL (ref 0.00–0.08)
Basophils Automated: 0.5 %
Eosinophils Absolute Automated: 0.19 10*3/uL (ref 0.00–0.44)
Eosinophils Automated: 2.1 %
Hematocrit: 40.8 % (ref 34.7–43.7)
Hgb: 13.7 g/dL (ref 11.4–14.8)
Immature Granulocytes Absolute: 0.03 10*3/uL (ref 0.00–0.07)
Immature Granulocytes: 0.3 %
Lymphocytes Absolute Automated: 1.69 10*3/uL (ref 0.42–3.22)
Lymphocytes Automated: 18.6 %
MCH: 29.9 pg (ref 25.1–33.5)
MCHC: 33.6 g/dL (ref 31.5–35.8)
MCV: 89.1 fL (ref 78.0–96.0)
MPV: 10.9 fL (ref 8.9–12.5)
Monocytes Absolute Automated: 0.91 10*3/uL — ABNORMAL HIGH (ref 0.21–0.85)
Monocytes: 10 %
Neutrophils Absolute: 6.24 10*3/uL (ref 1.10–6.33)
Neutrophils: 68.5 %
Nucleated RBC: 0 /100 WBC (ref 0.0–0.0)
Platelets: 254 10*3/uL (ref 142–346)
RBC: 4.58 10*6/uL (ref 3.90–5.10)
RDW: 13 % (ref 11–15)
WBC: 9.11 10*3/uL (ref 3.10–9.50)

## 2018-12-09 LAB — GFR: EGFR: >60

## 2018-12-09 LAB — HEMOLYSIS INDEX: Hemolysis Index: 18 (ref 0–18)

## 2018-12-09 LAB — GLUCOSE WHOLE BLOOD - POCT
Whole Blood Glucose POCT: 65 mg/dL — ABNORMAL LOW (ref 70–100)
Whole Blood Glucose POCT: 115 mg/dL — ABNORMAL HIGH (ref 70–100)

## 2018-12-09 LAB — MAGNESIUM: Magnesium: 2 mg/dL (ref 1.6–2.6)

## 2018-12-09 MED ORDER — POTASSIUM CHLORIDE CRYS ER 10 MEQ PO TBCR
40.00 meq | EXTENDED_RELEASE_TABLET | Freq: Once | ORAL | Status: AC
Start: 2018-12-09 — End: 2018-12-09
  Administered 2018-12-09: 40 meq via ORAL
  Filled 2018-12-09: qty 4

## 2018-12-09 MED ORDER — FUROSEMIDE 20 MG PO TABS
40.0000 mg | ORAL_TABLET | Freq: Every day | ORAL | 0 refills | Status: DC
Start: 2018-12-09 — End: 2019-05-09

## 2018-12-09 NOTE — Progress Notes (Signed)
Discharge order verified, pt ID verfied    Discharged to home in care of daughter. AVS reviewed with daughter via telephone as well.      [place] instructions (AVS)  given, answered all questions, teachback by patient performed, scripts sent to pharmacy     Tele [place],  IV [place]'d, catheter withdrawn intact, tolerated without difficulty    no complaints voiced, belongings with patient and mask on

## 2018-12-09 NOTE — Respiratory Progress Note (Signed)
Complete PFT 07/31/2018 No obstructive defect per FEV1/FVC. Mild restrictive and diffusion defect.

## 2018-12-09 NOTE — Discharge Instr - AVS First Page (Addendum)
Reason for your Hospital Admission:  Acute worsening of heart failure  Fluid overload    Instructions for after your discharge:  Take the water pill" Lasix" once daily  Check your weight daily  Write down your daily weight in the log book  Take it with you to your doctor's office to adjust the water pill dose  Continue low-salt diet

## 2018-12-09 NOTE — Progress Notes (Signed)
Pt is being discharged to home on this date.  Pt/family to schedule follow up visit within 3 days with Dr Yancey Flemings as outlined on the AVS.  DCP: Home w/ dtr to transport     12/09/18 1240   Discharge Disposition   Patient preference/choice provided? Yes   Physical Discharge Disposition Home   Mode of Transportation Car   Patient/Family/POA notified of transfer plan Yes   Patient agreeable to discharge plan/expected d/c date? Yes   Family/POA agreeable to discharge plan/expected d/c date? Yes   Bedside nurse notified of transport plan? Yes   CM Interventions   Follow up appointment scheduled? No   Reason no follow up scheduled? Family to schedule  (pt/family to scheule follow up in 3 days with Dr Yancey Flemings as outlined on the AVS)   Multidisciplinary rounds/family meeting before d/c? Yes   Peggyann Shoals  [phone]

## 2018-12-09 NOTE — Progress Notes (Signed)
[facility] Cardiology Progress Note       Assessment and Plan:   Patient feels much improved and back to baseline today      1. Presumed component of diastolic HF    - Recent nuclear stress test Oct 2020 negative for ischemia    -- Echo with preserved EF (Oct 2020)    -- PHTN, improved to PASP 30's    -- HTN    -- DM    -- AF on Eliquis    Seen by cardiologist a few weeks ago with similar complaints.    CXR markedly abnormal. She has had a robust diuresis thus far to 40 mg IV Lasix. Taking HCTZ as OP.    Plan:  Change to oral lasix 40 mg QD -- agree with this at this time; BMP in one week  Repeat CXR -- not performed  Back on her Lisinopril and nifedipine and metoprolol  Daily weights and strict I/s and O's emphasized and HF teaching emphasized prior to d/c  Monitor renal function (normal)  Telemetry while here  Eliquis 5 mg BID for underlying AF    F/u with Dr. Charlyne Petrin in one week.  Daughter plans to setup appointment.    Call with questions.      Patient Active Problem List    Diagnosis Date Noted   . Hyperkalemia [E87.5] 09/02/2015   . Cystocele [IMO0002] 04/08/2015   . Venous insufficiency [I87.2] 09/17/2014   . Non-rheumatic mitral regurgitation [I34.0] 02/14/2014   . Diabetic retinopathy [E11.319] 06/28/2013   . Elevated liver enzymes [R74.8] 06/18/2012   . Fatty liver [K76.0] 06/18/2012   . Glaucoma [H40.9] 06/01/2012   . HDiabetes mellitus type 2 in obese [E11.69, E66.9]    . HHypertension [I10]    . HDyslipidemia [E78.5]    . Obesity [E66.9]              Subjective:   Denies chest pain, SOB, palpitations  Feels back to baseline    Physical Exam:   BP 121/77   Pulse 75   Temp 98.3 F (36.8 C) (Oral)   Resp 19   Ht 1.651 m (5\' 5" )   Wt 81 kg (178 lb 9.6 oz)   SpO2 97%   BMI 29.72 kg/m       Temp (24hrs), Avg:98.1 F (36.7 C), Min:97.6 F (36.4 C), Max:98.4 F (36.9 C)    Weight: 81 kg (178 lb 9.6 oz)    Intake and Output Summary (Last 24 hours) at Date  Time    Intake/Output Summary (Last 24 hours) at 12/09/2018 1052  Last data filed at 12/08/2018 1500  Gross per 24 hour   Intake 240 ml   Output 600 ml   Net -360 ml       General appearance - alert, well appearing, and in no distress  Neck - supple, Carotids 2+, no bruit, no JVD  Chest - clear to auscultation, no wheezes  Heart - irregularly irregular; S1 and S2 normal, no murmurs, PMI not palpable  Abdomen - soft, nontender, nondistended, no masses or organomegaly  Extremities - peripheral pulses normal, no pedal edema, no clubbing or cyanosis    Medications:     Current Facility-Administered Medications   Medication Dose Route Frequency   . apixaban  5 mg Oral Q12H   . atorvastatin  20 mg Oral QHS   . famotidine  20 mg Oral Q12H SCH    Or   . famotidine  20 mg  Intravenous Q12H SCH   . furosemide  40 mg Intravenous Daily   . insulin lispro  1-4 Units Subcutaneous QHS   . insulin lispro  1-8 Units Subcutaneous TID AC   . insulin NPH  50 Units Subcutaneous BID AC   . latanoprost  1 drop Both Eyes Q12H SCH   . lisinopril  40 mg Oral Daily   . metoprolol succinate XL  25 mg Oral QPM   . NIFEdipine  60 mg Oral Daily   . potassium chloride  40 mEq Oral Once       Labs:   Recent CMP   Recent Labs     12/09/18  0423   Glucose 63*   BUN 24.0*   Creatinine 0.7   Sodium 141   Potassium 3.4*   CO2 24   Calcium 8.6     Recent CARDIAC ENZYMES No results for input(s): TROPONIN, ISTATTROPONI, CK in the last 24 hours.    Invalid input(s): TROPONINT, CKMB[24  Recent TSH Invalid input(s): TSH[24  Recent PT/PTT No results for input(s): INR, PTT in the last 24 hours.    Invalid input(s): PTI, COUM, COUMP, ACOAG, ACOAP[24  Recent CBC WITH DIFF   Recent Labs     12/09/18  0423   RBC 4.58   Hgb 13.7   Hematocrit 40.8   MCV 89.1   MCHC 33.6   RDW 13   MPV 10.9   Platelets 254     Recent LIPID PANEL No results for input(s): CHOL, TRIG, HDL in the last 24 hours.    Invalid input(s): LDLC, VLDLC, LRAT[24    Rads:     Radiology Results (24  Hour)     ** No results found for the last 24 hours. **          Cardiographics:     Telemetry: AF, rate controlled, narrow QRS

## 2018-12-10 ENCOUNTER — Other Ambulatory Visit (INDEPENDENT_AMBULATORY_CARE_PROVIDER_SITE_OTHER): Payer: Self-pay | Admitting: Internal Medicine

## 2018-12-10 ENCOUNTER — Encounter (INDEPENDENT_AMBULATORY_CARE_PROVIDER_SITE_OTHER): Payer: Self-pay | Admitting: Internal Medicine

## 2018-12-10 DIAGNOSIS — N390 Urinary tract infection, site not specified: Secondary | ICD-10-CM

## 2018-12-10 MED ORDER — SULFAMETHOXAZOLE-TRIMETHOPRIM 800-160 MG PO TABS
1.0000 | ORAL_TABLET | Freq: Two times a day (BID) | ORAL | 0 refills | Status: AC
Start: 2018-12-10 — End: 2018-12-17

## 2018-12-14 ENCOUNTER — Encounter (INDEPENDENT_AMBULATORY_CARE_PROVIDER_SITE_OTHER): Payer: Self-pay | Admitting: Internal Medicine

## 2018-12-26 ENCOUNTER — Other Ambulatory Visit (INDEPENDENT_AMBULATORY_CARE_PROVIDER_SITE_OTHER): Payer: Self-pay | Admitting: Internal Medicine

## 2018-12-26 DIAGNOSIS — E669 Obesity, unspecified: Secondary | ICD-10-CM

## 2019-01-17 ENCOUNTER — Encounter (INDEPENDENT_AMBULATORY_CARE_PROVIDER_SITE_OTHER): Payer: Self-pay | Admitting: Internal Medicine

## 2019-01-21 ENCOUNTER — Encounter (INDEPENDENT_AMBULATORY_CARE_PROVIDER_SITE_OTHER): Payer: Self-pay | Admitting: Internal Medicine

## 2019-01-21 ENCOUNTER — Ambulatory Visit (INDEPENDENT_AMBULATORY_CARE_PROVIDER_SITE_OTHER): Payer: HMO | Admitting: Internal Medicine

## 2019-01-21 VITALS — BP 176/94 | HR 68 | Temp 97.8°F | Ht 65.0 in | Wt 183.0 lb

## 2019-01-21 DIAGNOSIS — I10 Essential (primary) hypertension: Secondary | ICD-10-CM

## 2019-01-21 DIAGNOSIS — E1169 Type 2 diabetes mellitus with other specified complication: Secondary | ICD-10-CM

## 2019-01-21 DIAGNOSIS — E782 Mixed hyperlipidemia: Secondary | ICD-10-CM

## 2019-01-21 DIAGNOSIS — E669 Obesity, unspecified: Secondary | ICD-10-CM

## 2019-01-21 MED ORDER — INSULIN SYRINGE-NEEDLE U-100 31G X 5/16" 1 ML MISC
2 refills | Status: DC
Start: 2019-01-21 — End: 2019-05-09

## 2019-01-21 MED ORDER — NOVOLIN N 100 UNIT/ML SC SUSP
SUBCUTANEOUS | 3 refills | Status: DC
Start: 2019-01-21 — End: 2019-05-09

## 2019-01-21 MED ORDER — LISINOPRIL 40 MG PO TABS
40.0000 mg | ORAL_TABLET | Freq: Every day | ORAL | 1 refills | Status: DC
Start: 2019-01-21 — End: 2019-05-09

## 2019-01-21 MED ORDER — METFORMIN HCL 1000 MG PO TABS
1000.0000 mg | ORAL_TABLET | Freq: Two times a day (BID) | ORAL | 1 refills | Status: DC
Start: 2019-01-21 — End: 2019-05-09

## 2019-01-21 NOTE — Progress Notes (Signed)
Subjective:      Patient ID: [name] is a 63 y.o. female     Chief Complaint   Patient presents with   . Diabetes     f/u   . Hypertension     f/u        HPI   Patient is1 year old Hispanic female with history of diabetes mellitus 2now followed by endocrinologist Dr. Benjiman Core, hypertension, hyperlipidemia, afib followed by cardiologist Dr. Fransisco Beau, among others, who is here todayforthe above.    Since her last visit with me in October, she was hospitalized later that month from October 30 until November 1 with symptoms of shortness of breath.  In the emergency department she was found to have a BNP of 322 and her chest x-ray showed pneumonia versus pulmonary edema.  She was treated with diuresis and, per hospital discharge note, diuresed about 1.6 L on the first day.  She was discharged home on Lasix 40 mg daily.    Since the above hospitalization, patient reports she has followed up with her cardiologist Dr. Fransisco Beau and has been taken off Lasix due to low blood pressure readings.  Her blood pressure is elevated today, however she states her blood pressures have been in the 120s to 140 systolic and 50s to 70s diastolic at home.  She reports she has been checking her weight daily at home and these have been stable.    She reports that she has started to walk regularly again and has not experienced any significant shortness of breath.    Blood pressure today is higher but patient attributes this to not taking her Lasix.  She reports she has been checking her blood pressures at home and readings have been in the 120s to 140 systolic and 50s to 70s diastolic.    DM2 -last A1c was 7.6% in September.  She reports compliance with all medications for diabetes as below.  Patient reports she has been checking her glucose in the mornings and readings have been in the 90s to 100s and bedtime glucose readings have been in the 160s.  She denies any polydipsia, polyuria or paresthesias.    In regards to her history of  hyperlipidemia, she reports that her cardiologist increased dose of Lipitor to 40 mg which she has been taking    The following sections were reviewed this encounter by the provider:   Tobacco  Allergies  Meds  Problems  Med Hx  Surg Hx  Fam Hx         Past Medical History:   Diagnosis Date   . Diabetes mellitus type 2 in obese    . Diabetic retinopathy 06/28/2013    Of the right eye, per patient   . Glaucoma 06/01/2012   . Hyperlipidemia    . Hypertension    . Obesity    . Venous insufficiency 09/17/2014    Followed at [facility].  Wears compression stockings.        Social History     Socioeconomic History   . Marital status: Single     Spouse name: Not on file   . Number of children: 3   . Years of education: Not on file   . Highest education level: Not on file   Occupational History   . Occupation: Maintenance     Employer: Civil engineer, contracting   Social Needs   . Financial resource strain: Not on file   . Food insecurity     Worry: Not on file  Inability: Not on file   . Transportation needs     Medical: Not on file     Non-medical: Not on file   Tobacco Use   . Smoking status: Never Smoker   . Smokeless tobacco: Never Used   Substance and Sexual Activity   . Alcohol use: No   . Drug use: No   . Sexual activity: Not Currently   Lifestyle   . Physical activity     Days per week: Not on file     Minutes per session: Not on file   . Stress: Not on file   Relationships   . Social Wellsite geologist on phone: Not on file     Gets together: Not on file     Attends religious service: Not on file     Active member of club or organization: Not on file     Attends meetings of clubs or organizations: Not on file     Relationship status: Not on file   . Intimate partner violence     Fear of current or ex partner: Not on file     Emotionally abused: Not on file     Physically abused: Not on file     Forced sexual activity: Not on file   Other Topics Concern   . Not on file   Social History Narrative    Originally  from [place].  Lives with her daughter.    Exercise: walks frequently       Allergies   Allergen Reactions   . Shellfish-Derived Products        Current Outpatient Medications   Medication Sig Dispense Refill   . apixaban (ELIQUIS) 5 MG Take 5 mg by mouth every 12 (twelve) hours.     Marland Kitchen atorvastatin (LIPITOR) 20 MG tablet Take 1 tablet (20 mg total) by mouth daily 90 tablet 1   . BD INSULIN SYRINGE ULTRAFINE 31G X 5/16" 1 ML Misc ADMINSTER INSULIN UNDER THE SKIN 2 TIMES A DAY 300 each 0   . Blood Glucose Monitoring Suppl (ACCU-CHEK AVIVA PLUS) w/Device Kit Check blood glucose level twice daily. ICD 10 Code E11.9, Z79.4 1 kit 1   . Calcium Carbonate-Vitamin D (CALCIUM-D PO) Take 1 tablet by mouth daily.     . furosemide (LASIX) 20 MG tablet Take 2 tablets (40 mg total) by mouth daily 30 tablet 0   . glucose blood (Accu-Chek Aviva) test strip Check blood glucose level twice daily. ICD 10 Code E11.9, Z79.4 200 each 2   . hydroCHLOROthiazide (HYDRODIURIL) 12.5 MG tablet Take 12.5 mg by mouth daily        . insulin NPH (NovoLIN N) 100 UNIT/ML injection INJECT 50 UNITS SUBCUTANEOUSLY WITH BREAKFST AND 24 UNITS WITH DINNER 30 mL 3   . Lancets (ACCU-CHEK SOFT TOUCH) lancets Check blood glucose level twice daily. ICD 10 Code E11.9, Z79.4 200 each 2   . lisinopril (ZESTRIL) 40 MG tablet Take 1 tablet (40 mg total) by mouth daily 90 tablet 1   . metFORMIN (GLUCOPHAGE) 1000 MG tablet Take 1 tablet (1,000 mg total) by mouth 2 (two) times daily with meals 180 tablet 1   . metoprolol succinate XL (TOPROL-XL) 50 MG 24 hr tablet Take 25 mg by mouth every evening        . NIFEdipine (PROCARDIA XL) 60 MG 24 hr tablet Take 60 mg by mouth daily        . TRAVATAN Z 0.004 % Solution  ophthalmic solution        No current facility-administered medications for this visit.        Review of Systems   Constitutional: Negative for fatigue.   Respiratory: Negative for chest tightness and shortness of breath.    Cardiovascular: Negative for chest  pain and palpitations.   Gastrointestinal: Negative for abdominal pain, constipation, diarrhea, nausea and vomiting.   Endocrine: Negative for polydipsia and polyuria.   Musculoskeletal: Negative for myalgias.   Neurological: Negative for dizziness, light-headedness and headaches.        Objective:   BP (!) 176/94   Pulse 68   Temp 97.8 F (36.6 C)   Ht 1.651 m (5\' 5" )   Wt 83 kg (183 lb)   BMI 30.45 kg/m     Physical Exam  Vitals signs reviewed.   Constitutional:       General: She is not in acute distress.     Appearance: She is well-developed. She is not diaphoretic.   Eyes:      General: No scleral icterus.     Conjunctiva/sclera: Conjunctivae normal.   Cardiovascular:      Rate and Rhythm: Normal rate. Rhythm irregular.      Heart sounds: No murmur. No friction rub. No gallop.    Pulmonary:      Effort: Pulmonary effort is normal. No respiratory distress.      Breath sounds: No wheezing or rales.   Abdominal:      Palpations: Abdomen is soft. There is no mass.      Tenderness: There is no abdominal tenderness.   Skin:     General: Skin is warm and dry.   Neurological:      Mental Status: She is alert and oriented to person, place, and time.   Psychiatric:         Behavior: Behavior normal.         Lab Results   Component Value Date    HGBA1C 7.6 (H) 10/22/2018     Lab Results   Component Value Date    WBC 9.11 12/09/2018    HGB 13.7 12/09/2018    HCT 40.8 12/09/2018    MCV 89.1 12/09/2018    PLT 254 12/09/2018     '  Chemistry        Component Value Date/Time    NA 141 12/09/2018 0423    K 3.4 (L) 12/09/2018 0423    CL 106 12/09/2018 0423    CL 104 02/15/2014 0733    CO2 24 12/09/2018 0423    BUN 24.0 (H) 12/09/2018 0423    CREAT 0.7 12/09/2018 0423    CREAT 0.83 12/04/2018 0000    CREAT 0.78 02/15/2014 0733    GLU 63 (L) 12/09/2018 0423        Component Value Date/Time    CA 8.6 12/09/2018 0423    ALKPHOS 112 (H) 12/07/2018 1203    AST 27 12/07/2018 1203    ALT 33 12/07/2018 1203    BILITOTAL 1.0  12/07/2018 1203          Lab Results   Component Value Date    CHOL 175 07/19/2018    CHOL 123 03/29/2018    CHOL 168 12/25/2017     Lab Results   Component Value Date    HDL 70 07/19/2018    HDL 67 03/29/2018    HDL 57 12/25/2017     Lab Results   Component Value Date    LDL 89 07/19/2018  LDL 42 03/29/2018    LDL 90 12/25/2017     Lab Results   Component Value Date    TRIG 81 07/19/2018    TRIG 70 03/29/2018    TRIG 107 12/25/2017     Lab Results   Component Value Date    TSH 0.98 12/07/2018    TSH 1.17 06/28/2013     No results found for: VITD, 25HYDROXYDTO     Assessment:     1. Diabetes mellitus type 2 in obese  -Uncontrolled on last hemoglobin A1c, with goal of under 7%  -Patient will return for a lab visit to recheck hemoglobin A1c and other labs as below  -Strongly encouraged efforts to lose weight, monitor carbohydrate intake and exercise regularly  -For now, continue current medications for diabetes and adjust as needed.  - Comprehensive metabolic panel; Future  - Hemoglobin A1C; Future  - metFORMIN (GLUCOPHAGE) 1000 MG tablet; Take 1 tablet (1,000 mg total) by mouth 2 (two) times daily with meals  Dispense: 180 tablet; Refill: 1  - insulin NPH (NovoLIN N) 100 UNIT/ML injection; INJECT 52 UNITS SUBCUTANEOUSLY WITH BREAKFST AND 24 UNITS WITH DINNER  Dispense: 30 mL; Refill: 3  - Insulin Syringe-Needle U-100 (BD INSULIN SYRINGE ULTRAFINE) 31G X 5/16" 1 ML Misc; ADMINSTER INSULIN UNDER THE SKIN 2 TIMES A DAY  Dispense: 300 each; Refill: 2    2. Essential hypertension  -Blood pressure elevated today, with goal of under 130/80 mmHg  -Patient reports blood pressures have been at goal at home  -She has follow-up scheduled with her cardiologist who has been managing hypertension  -For now she will continue current antihypertensive medications and Lasix  - Lisinopril (ZESTRIL) 40 MG tablet; Take 1 tablet (40 mg total) by mouth daily  Dispense: 90 tablet; Refill: 1    3. Mixed hyperlipidemia  - Well-controlled on  last lipid panel that was last done  - Continue current statin medication  - Encourage heart healthy diet such as the mediterranean diet together with aerobic exercise for 30-45 minutes 4-5 days a week as tolerated  - Comprehensive metabolic panel; Future  - Lipid panel; Future        Plan:     Plan is as above.     Return in about 3 months (around 04/21/2019) for f/u diabetes.    Standley Dakins, MD

## 2019-01-27 ENCOUNTER — Encounter (INDEPENDENT_AMBULATORY_CARE_PROVIDER_SITE_OTHER): Payer: Self-pay | Admitting: Internal Medicine

## 2019-02-20 ENCOUNTER — Encounter (INDEPENDENT_AMBULATORY_CARE_PROVIDER_SITE_OTHER): Payer: Self-pay | Admitting: Internal Medicine

## 2019-02-20 DIAGNOSIS — E669 Obesity, unspecified: Secondary | ICD-10-CM

## 2019-02-20 DIAGNOSIS — E782 Mixed hyperlipidemia: Secondary | ICD-10-CM

## 2019-04-14 ENCOUNTER — Emergency Department
Admission: EM | Admit: 2019-04-14 | Discharge: 2019-04-14 | Disposition: A | Discharge status: Home / Self Care | Payer: HMO | Attending: Emergency Medicine | Admitting: Emergency Medicine

## 2019-04-14 ENCOUNTER — Emergency Department: Payer: HMO

## 2019-04-14 DIAGNOSIS — Z7901 Long term (current) use of anticoagulants: Secondary | ICD-10-CM | POA: Insufficient documentation

## 2019-04-14 DIAGNOSIS — I4891 Unspecified atrial fibrillation: Secondary | ICD-10-CM | POA: Insufficient documentation

## 2019-04-14 DIAGNOSIS — W010XXA Fall on same level from slipping, tripping and stumbling without subsequent striking against object, initial encounter: Secondary | ICD-10-CM | POA: Insufficient documentation

## 2019-04-14 DIAGNOSIS — M25562 Pain in left knee: Secondary | ICD-10-CM | POA: Insufficient documentation

## 2019-04-14 DIAGNOSIS — M25462 Effusion, left knee: Secondary | ICD-10-CM

## 2019-04-14 DIAGNOSIS — S2242XA Multiple fractures of ribs, left side, initial encounter for closed fracture: Secondary | ICD-10-CM | POA: Insufficient documentation

## 2019-04-14 MED ORDER — LIDOCAINE 5 % EX PTCH
1.00 | MEDICATED_PATCH | Freq: Once | CUTANEOUS | Status: DC
Start: 2019-04-14 — End: 2019-04-14

## 2019-04-14 MED ORDER — OXYCODONE-ACETAMINOPHEN 5-325 MG PO TABS
1.0000 | ORAL_TABLET | ORAL | 0 refills | Status: AC | PRN
Start: 2019-04-14 — End: 2019-04-21

## 2019-04-14 MED ORDER — OXYCODONE-ACETAMINOPHEN 5-325 MG PO TABS
1.0000 | ORAL_TABLET | Freq: Once | ORAL | Status: DC
Start: 2019-04-14 — End: 2019-04-14

## 2019-04-14 MED ORDER — LIDOCAINE 5 % EX PTCH
1.00 | MEDICATED_PATCH | CUTANEOUS | 0 refills | Status: DC
Start: 2019-04-14 — End: 2020-01-21

## 2019-04-14 NOTE — ED Provider Notes (Signed)
EMERGENCY DEPARTMENT HISTORY AND PHYSICAL EXAM    PPE: Mask, face shield and gloves were worn every time I entered the room.     Due to language barrier, the appropriate medical translator or service was not utilized to ensure accurate information was obtained and communication was unimpeded throughout the emergency department visit because the patient and/or family verbalized in [place] that they understood everything that was discussed. The patient and/or family is aware that a translator is available if they have any questions or concerns.    History of Presenting Illness:  History Provided By: Patient and Son    [name] is a 64 y.o. female pw fall yesterday.  Patient was on her daily walk.  Tripped over a step.  Landed on left knee and chest.  Went to Urgent Care.  Xry left knee - results pending.  Given norco.  Unable to bear weight.  No hip pain.  Also, left chest pain w/breathing since fall.  No cough or fever.     On eliquis 2/2 afib.     Reviewed and confirmed past medical, surgical, family and social history as documented.    PCP: Castillo-Catoni, Maudry Mayhew, MD  SPECIALISTS: Cam, Cards    Review of Systems:  Review of Systems   Constitutional: Negative for chills and fever.   Respiratory: Negative for cough and shortness of breath.    Cardiovascular: Positive for chest pain.   Musculoskeletal: Positive for arthralgias and joint swelling.   All other systems reviewed as negative.    Physical Exam:  Vitals:    04/14/19 1211 04/14/19 1212   BP: 174/80    Pulse: (!) 107    Resp: 16    Temp:  97.2 F (36.2 C)   TempSrc:  Oral   SpO2: 98%    Weight:  83.9 kg   Height:  5\' 5"  (1.651 m)     Pulse Oximetry Interpretation: Normal     Physical Exam   Constitutional: Patient is alert.  Well nourished.  NAD  Head: Atraumatic.   Eyes: EOMI. PERRL  ENT:  MMM.   Neck:  FROM. No spinal tenderness. Neck supple.    Cardiovascular: Normal rate and regular rhythm.   Pulmonary/Chest: Effort normal and breath sounds  normal. No respiratory distress.   Abdominal: Soft. There is no tenderness. Bowel sounds present and normal.    Musculoskeletal:  Left knee w/moderate effusion.  FROM left hip.   Neurological: Patient is alert and oriented to person, place, and time.  No focal deficits.   Skin: Abrasion left knee.     Old Medical Records: Old medical records.  Nursing notes.    Patient Update Notes:       Provider Notes: Fall yesterday.  Multiple left sided rib fractures.  Left knee effusion (no fx on xry).  Suspect hemarthrosis.  Declined admission for pain control.  Change norco to percocet.  Return for fever or SOB.     Clinical Impression:   1. Closed fracture of multiple ribs of left side, initial encounter    2. Effusion of left knee    3. Current use of long term anticoagulation        ED Disposition     ED Disposition Condition Date/Time Comment    Discharge  Sun Apr 14, 2019  3:16 PM [name] discharge to home/self care.    Condition at disposition: Stable              This note  was generated by the Epic EMR system/ Dragon speech recognition and may contain inherent errors or omissions not intended by the user. Grammatical errors, random word insertions, deletions and pronoun errors  are occasional consequences of this technology due to software limitations. Not all errors are caught or corrected. If there are questions or concerns about the content of this note or information contained within the body of this dictation they should be addressed directly with the author for clarification     Tenny Craw, MD  04/16/19 [phone]

## 2019-04-14 NOTE — Discharge Instructions (Signed)
    Fractura de costilla        Rib Fracture      1.   Se le ha diagnosticado una fractura de costilla (“costilla rota”).    1.   You have been diagnosed with a rib fracture ("broken rib").                    2.   Fractura significa “hueso roto”. Las fracturas de costilla son costillas rotas. Cuando hay una fractura de costilla, los músculos que se encuentran entre las costillas probablemente también están lesionados. Esto se llama una contusión de la pared torácica. Ambas condiciones son dolorosas. Esto se debe a que cada vez que se respira se mueve el área lesionada. Las condiciones no son peligrosas por sí mismas. En ocasiones se presentan complicaciones. Estas incluyen neumonía o un pulmón colapsado. Las fracturas de costilla toman de 4 a 8 semanas para sanar. Su dolor debería desaparecer durante este período.    2.   Fracture means “broken bone.” Rib fractures are broken ribs. When there is a rib fracture, muscles between the ribs are also likely bruised. This is called a chest wall contusion. Both conditions are painful. This is because every breath moves the injured area. The conditions are not dangerous on their own. Sometimes complications happen. These include pneumonia or a collapsed lung. Rib fractures take 4 to 8 weeks to heal. Your pain should go down over this time.                    3.   No sujete ni encinte sus costillas. Sujetarlas o encintarlas puede ayudarle con el dolor. Sin embargo, aumenta el riesgo de una neumonía.    3.   Do not bind or tape your ribs. Binding or taping them may help with the pain. However, it makes the risk of pneumonia worse.                    4.   Tosa y respire profundamente al menos 10 veces por hora durante el día. Hágalo aunque le resulte doloroso. Apoye el área con una almohada o con su mano. Esto le ayudará con el dolor. Use el analgésico como se le indicó. Esto le ayudará con el dolor. Podrá respirar normalmente. Esto le ayudará a hacer los ejercicios de tos y  respiración profunda. Estos ejercicios pueden ayudarle a evitar una neumonía.    4.   Cough and deep breathe at least 10 times an hour while awake. Do this even if it is painful. Support the area with a pillow or your hand. This will help with the pain. Use pain medicine as prescribed. This will help with the pain. You will be able to breathe normally. This will help you do the coughing and deep-breathing exercises. These exercises can help prevent pneumonia.                    5.   DEBE BUSCAR ATENCIÓN MÉDICA INMEDIATAMENTE, AQUÍ O EN LA SALA DE EMERGENCIAS MÁS CERCANA, SI SE PRESENTA CUALQUIERA DE LAS SIGUIENTES SITUACIONES:    5.   YOU SHOULD SEEK MEDICAL ATTENTION IMMEDIATELY, EITHER HERE OR AT THE NEAREST EMERGENCY DEPARTMENT, IF ANY OF THE FOLLOWING OCCURS:        * Si le falta el aliento (resuellos o problemas para respirar).      * Shortness of breath (wheezing or trouble breathing).        * Si tose una sustancia verde o amarilla.      * Coughing up green or yellow material.        * Si tiene fiebre (temperatura mayor de 100.4º F/38º C) o fiebre que no desaparece.      * Fever (temperature higher than 100.4ºF / 38ºC) or any fever that doesn't   Si tiene Teacher, adult education o un dolor que de pronto [place].    * Severe chest pain or pain that suddenly gets worse.      * Si no mejora en los prximos das.    * No improvement in the next few days.                      Derrame articular     Joint Effusion     1.  Usted ha sido atendido por un derrame articular.   1.  You have been seen for a joint effusion.             2.  Un derrame articular es una acumulacin anormal de fluido en una articulacin. Generalmente hay una cantidad pequea de lquido en la articulacin. Sin embargo, algunas condiciones causan que la cantidad de este fluido [place]. Esto causa derrame articular. Los derrames articulares pueden ocurrir en cualquier articulacin del cuerpo. Esto  incluye rodillas, caderas, tobillos y codos.   2.  An effusion is an abnormal collection of fluid in a joint. There is normally a small amount of fluid in the joint. However, some conditions cause the amount of this fluid to increase. This leads to joint effusion. Joint effusions can happen in any joint in the body. This includes the knees, hips, ankles and elbows.             3.  Algunos sntomas de un derrame articular son:   3.  Some symptoms of a joint effusion are:      * Hinchazn de la articulacin.    * Swelling of the joint.      * Rigidez de Nurse, learning disability. Esto puede impedir que pueda doblar o estirar la articulacin por completo.     * Stiffness of the joint. This can keep you from fully bending or straightening the joint.       * Dolor en la articulacin que a menudo empeora con la actividad y disminuye con el descanso.    * Pain in the joint that is often made worse with activity and gets better with rest.      * Moretones alrededor de la articulacin si la hinchazn se debe a una lesin.    * Bruising around the joint if the swelling is from an injury.             4.  Algunas causas de un derrame articular son:   4.  Some causes of a joint effusion are:      * Infeccin dentro o alrededor de la articulacin.    * Infection within or around the joint.      * Artritis reumatoide (inflamacin de las articulaciones).    * Rheumatoid arthritis (inflammation of the joints).      * Gota (hinchazn de las articulaciones).    * Gout (swelling of the joints).      * Osteoartritis (falla del cartlago de las articulaciones).    * Osteoarthritis (breakdown of cartilage in the joints).      * Una lesin.    * Injury.             5.  Se le tomaron radiografas de su articulacin hinchada. stas no mostraron seales de huesos rotos.   5.  You had x-rays of the swollen joint. They showed no signs of any broken bones.  6.  Puesto  que su derrame no parece ser causado por algo peligroso, ya se puede ir a casa.   6.  Because your effusion does not seem to be from anything dangerous, it is OK for you to go home today.             7.  DEBE BUSCAR ATENCIN MDICA INMEDIATAMENTE, AQU O EN LA SALA DE EMERGENCIAS MS CERCANA, SI SE PRESENTA CUALQUIERA DE LAS SIGUIENTES SITUACIONES:   7.  YOU SHOULD SEEK MEDICAL ATTENTION IMMEDIATELY, EITHER HERE OR AT THE NEAREST EMERGENCY DEPARTMENT, IF ANY OF THE FOLLOWING OCCUR:      * Su hinchazn en la articulacin empeora.    * Your joint swelling gets worse.      * El dolor en su articulacin empeora.    * Your joint becomes more painful.      * Tiene fiebre (temperatura mayor de 100.32F / 38C), escalofros o nota que su articulacin hinchada se vuelve ms enrojecida o caliente.    * You have fever (temperature higher than 100.32F / 38C) or chills or notice that your swollen joint gets more red or warm.      * Tiene otros problemas o preocupaciones.    * You have any other problems or concerns.

## 2019-04-14 NOTE — ED Triage Notes (Signed)
Pt's son states pt does a walk every day, yesterday she didn't see the uneven concrete. Reports tripping yesterday on landed on left knee. Reports left knee pain and swelling. States they went to urgent care yesterday and they did xrays but today pain is a lot worse and is unable to move knee.

## 2019-05-02 ENCOUNTER — Encounter (INDEPENDENT_AMBULATORY_CARE_PROVIDER_SITE_OTHER): Payer: Self-pay | Admitting: Internal Medicine

## 2019-05-09 ENCOUNTER — Ambulatory Visit (INDEPENDENT_AMBULATORY_CARE_PROVIDER_SITE_OTHER): Payer: HMO | Admitting: Internal Medicine

## 2019-05-09 ENCOUNTER — Encounter (INDEPENDENT_AMBULATORY_CARE_PROVIDER_SITE_OTHER): Payer: Self-pay | Admitting: Internal Medicine

## 2019-05-09 DIAGNOSIS — I10 Essential (primary) hypertension: Secondary | ICD-10-CM

## 2019-05-09 DIAGNOSIS — E1169 Type 2 diabetes mellitus with other specified complication: Secondary | ICD-10-CM

## 2019-05-09 DIAGNOSIS — E669 Obesity, unspecified: Secondary | ICD-10-CM

## 2019-05-09 MED ORDER — INSULIN SYRINGE-NEEDLE U-100 31G X 5/16" 1 ML MISC
2 refills | Status: DC
Start: 2019-05-09 — End: 2020-03-23

## 2019-05-09 MED ORDER — LISINOPRIL 40 MG PO TABS
40.0000 mg | ORAL_TABLET | Freq: Every day | ORAL | 1 refills | Status: DC
Start: 2019-05-09 — End: 2020-02-26

## 2019-05-09 MED ORDER — METOPROLOL SUCCINATE ER 50 MG PO TB24
25.00 mg | ORAL_TABLET | Freq: Every evening | ORAL | 3 refills | Status: DC
Start: 2019-05-09 — End: 2020-10-14

## 2019-05-09 MED ORDER — METFORMIN HCL 1000 MG PO TABS
1000.0000 mg | ORAL_TABLET | Freq: Two times a day (BID) | ORAL | 1 refills | Status: DC
Start: 2019-05-09 — End: 2020-03-10

## 2019-05-09 MED ORDER — NOVOLIN N 100 UNIT/ML SC SUSP
SUBCUTANEOUS | 3 refills | Status: DC
Start: 2019-05-09 — End: 2019-10-18

## 2019-05-09 NOTE — Progress Notes (Signed)
Have you seen any specialists/other providers since your last visit with [place]?  Yes  orthopedic   Arm preference verified?  Yes    The patient is do for shingles vaccine and Advance directive on file, High risk pneumonia, [facility] care plan letter, Dm ophthalmology exam.pap smear.

## 2019-05-09 NOTE — Progress Notes (Signed)
Subjective:      Patient ID: [name] is a 64 y.o. female.    Chief Complaint:  Chief Complaint   Patient presents with   . Follow-up     routine   . Fall     pt had a fall March 3rd,        HPI:  Spanish Interpreter ID #[id number]    64 year old Hispanic female with history of diabetes mellitus 2now followed by endocrinologist Dr. Benjiman Core, hypertension, hyperlipidemia, afib followed by cardiologist Dr. Fransisco Beau, here for follow-up    She was seen in the ED March 3 for a fall; she was found to have closed fracture of multiple ribs on the left side    DM2  She takes Metformin   On Novolin N in 52 in the morning 24 at night; monitring BS at home  Today 105 in the morning  Last eye check up, last week, normal per patient  She wants refill on metformin, Lisinopril  She is on statin    HTN  She is compliant with her lisinopril and Toprol XL; blood pressure is elevated today; she denies headache, chest pain, shortness of breath, lower extremity edema    complaining of right 3rd foot turning purple, no pain; does not feel hot or cold; she has normal sensation; no swelling no open skin discharge.      Problem List:  Patient Active Problem List   Diagnosis   . Glaucoma   . Diabetes mellitus type 2 in obese   . Hypertension   . Dyslipidemia   . Obesity   . Elevated liver enzymes   . Fatty liver   . Diabetic retinopathy   . Non-rheumatic mitral regurgitation   . Venous insufficiency   . Cystocele   . Hyperkalemia       Current Medications:  Current Outpatient Medications   Medication Sig Dispense Refill   . apixaban (ELIQUIS) 5 MG Take 5 mg by mouth every 12 (twelve) hours.     Marland Kitchen atorvastatin (LIPITOR) 40 MG tablet Take 1 tablet by mouth every evening     . Blood Glucose Monitoring Suppl (ACCU-CHEK AVIVA PLUS) w/Device Kit Check blood glucose level twice daily. ICD 10 Code E11.9, Z79.4 1 kit 1   . Calcium Carbonate-Vitamin D (CALCIUM-D PO) Take 1 tablet by mouth daily.     Marland Kitchen glucose blood (Accu-Chek Aviva) test strip Check blood  glucose level twice daily. ICD 10 Code E11.9, Z79.4 200 each 2   . hydroCHLOROthiazide (HYDRODIURIL) 12.5 MG tablet Take 25 mg by mouth daily        . insulin NPH (NovoLIN N) 100 UNIT/ML injection INJECT 52 UNITS SUBCUTANEOUSLY WITH BREAKFST AND 24 UNITS WITH DINNER 30 mL 3   . Insulin Syringe-Needle U-100 (BD INSULIN SYRINGE ULTRAFINE) 31G X 5/16" 1 ML Misc ADMINSTER INSULIN UNDER THE SKIN 2 TIMES A DAY 300 each 2   . Lancets (ACCU-CHEK SOFT TOUCH) lancets Check blood glucose level twice daily. ICD 10 Code E11.9, Z79.4 200 each 2   . lidocaine (LIDODERM) 5 % Place 1 patch onto the skin every 24 hours Remove & Discard patch within 12 hours or as directed by MD 30 each 0   . lisinopril (ZESTRIL) 40 MG tablet Take 1 tablet (40 mg total) by mouth daily 90 tablet 1   . metFORMIN (GLUCOPHAGE) 1000 MG tablet Take 1 tablet (1,000 mg total) by mouth 2 (two) times daily with meals 180 tablet 1   . furosemide (LASIX)  20 MG tablet Take 2 tablets (40 mg total) by mouth daily 30 tablet 0   . insulin NPH-regular 70-30 MIXTURE (NovoLIN 70/30) (70-30) 100 UNIT/ML injection Novolin 70/30 U-100 Insulin 100 unit/mL subcutaneous suspension     . Lumigan 0.01 % Solution      . metoprolol succinate XL (TOPROL-XL) 50 MG 24 hr tablet Take 25 mg by mouth every evening          No current facility-administered medications for this visit.       Allergies:  Allergies   Allergen Reactions   . Shellfish-Derived Products        Past Medical History:  Past Medical History:   Diagnosis Date   . Diabetes mellitus type 2 in obese    . Diabetic retinopathy 06/28/2013    Of the right eye, per patient   . Glaucoma 06/01/2012   . Hyperlipidemia    . Hypertension    . Obesity    . Venous insufficiency 09/17/2014    Followed at [facility].  Wears compression stockings.        Past Surgical History:  Past Surgical History:   Procedure Laterality Date   . BREAST CYST EXCISION  1993    bilateral   . TUBAL LIGATION         Family History:  Family  History   Problem Relation Age of Onset   . Heart disease Mother    . Diabetes Mother    . Hypertension Mother    . Hyperlipidemia Mother    . Heart disease Father    . Diabetes Father    . Hypertension Father    . Hyperlipidemia Father    . Cirrhosis Father    . Liver cancer Maternal Aunt    . Cancer Maternal Uncle         liver   . Diabetes Sister        Social History:  Social History     Socioeconomic History   . Marital status: Single     Spouse name: Not on file   . Number of children: 3   . Years of education: Not on file   . Highest education level: Not on file   Occupational History   . Occupation: Maintenance     Employer: [employer]   Tobacco Use   . Smoking status: Never Smoker   . Smokeless tobacco: Never Used   Substance and Sexual Activity   . Alcohol use: No   . Drug use: No   . Sexual activity: Not Currently   Other Topics Concern   . Not on file   Social History Narrative    Originally from [place].  Lives with her daughter.    Exercise: walks frequently     Social Determinants of Health     Financial Resource Strain:    . Difficulty of Paying Living Expenses:    Food Insecurity:    . Worried About Programme researcher, broadcasting/film/video in the Last Year:    . Barista in the Last Year:    Transportation Needs:    . Freight forwarder (Medical):    Marland Kitchen Lack of Transportation (Non-Medical):    Physical Activity:    . Days of Exercise per Week:    . Minutes of Exercise per Session:    Stress:    . Feeling of Stress :    Social Connections:    . Frequency of Communication with Friends and Family:    .  Frequency of Social Gatherings with Friends and Family:    . Attends Religious Services:    . Active Member of Clubs or Organizations:    . Attends Banker Meetings:    Marland Kitchen Marital Status:    Intimate Partner Violence:    . Fear of Current or Ex-Partner:    . Emotionally Abused:    Marland Kitchen Physically Abused:    . Sexually Abused:        The following sections were reviewed this encounter by the provider:         ROS:  Review of Systems   Constitutional: Negative for activity change, appetite change, chills and fever.   Eyes: Negative for visual disturbance.   Respiratory: Negative for cough, chest tightness, shortness of breath and wheezing.    Cardiovascular: Negative for chest pain, palpitations and leg swelling.   Gastrointestinal: Negative for abdominal pain, constipation, diarrhea, nausea and vomiting.   Endocrine: Negative for polydipsia, polyphagia and polyuria.   Genitourinary: Negative for dysuria.   Musculoskeletal: Negative for back pain, gait problem and myalgias.   Skin: Negative for rash.   Neurological: Negative for dizziness.   Hematological: Negative for adenopathy.   Psychiatric/Behavioral: Negative for agitation.       Vitals:  BP 184/80   Pulse 70   Temp 97.8 F (36.6 C) (Oral)   Ht 1.651 m (5\' 5" )   Wt 84.8 kg (187 lb)   BMI 31.12 kg/m      Objective:     Physical Exam:  Physical Exam  Constitutional:       General: She is not in acute distress.     Appearance: Normal appearance. She is normal weight. She is not ill-appearing or toxic-appearing.   HENT:      Head: Normocephalic and atraumatic.   Cardiovascular:      Rate and Rhythm: Normal rate and regular rhythm.      Heart sounds: Normal heart sounds. No murmur. No gallop.    Pulmonary:      Effort: Pulmonary effort is normal. No respiratory distress.      Breath sounds: Normal breath sounds. No stridor. No wheezing.   Abdominal:      General: Abdomen is flat. Bowel sounds are normal. There is no distension.      Palpations: Abdomen is soft.   Musculoskeletal:         General: Normal range of motion.   Skin:     General: Skin is warm and dry.      Capillary Refill: Capillary refill takes less than 2 seconds.   Neurological:      General: No focal deficit present.      Mental Status: She is alert and oriented to person, place, and time. Mental status is at baseline.   Psychiatric:         Mood and Affect: Mood normal.         Behavior:  Behavior normal.         Thought Content: Thought content normal.          Assessment:     1. Essential hypertension  - lisinopril (ZESTRIL) 40 MG tablet; Take 1 tablet (40 mg total) by mouth daily  Dispense: 90 tablet; Refill: 1  - metoprolol succinate XL (TOPROL-XL) 50 MG 24 hr tablet; Take 0.5 tablets (25 mg total) by mouth every evening  Dispense: 30 tablet; Refill: 3  - Comprehensive metabolic panel    2. Diabetes mellitus type 2 in obese  -  metFORMIN (GLUCOPHAGE) 1000 MG tablet; Take 1 tablet (1,000 mg total) by mouth 2 (two) times daily with meals  Dispense: 180 tablet; Refill: 1  - insulin NPH (NovoLIN N) 100 UNIT/ML injection; INJECT 52 UNITS SUBCUTANEOUSLY WITH BREAKFST AND 24 UNITS WITH DINNER  Dispense: 30 mL; Refill: 3  - Insulin Syringe-Needle U-100 (BD INSULIN SYRINGE ULTRAFINE) 31G X 5/16" 1 ML Misc; ADMINSTER INSULIN UNDER THE SKIN 2 TIMES A DAY  Dispense: 300 each; Refill: 2  - Hemoglobin A1C  - Lipid panel      Plan:     Plan as above.    Melvenia Beam, MD

## 2019-05-10 LAB — COMPREHENSIVE METABOLIC PANEL
ALT: 22 IU/L (ref 0–32)
AST (SGOT): 30 IU/L (ref 0–40)
African American eGFR: 97 mL/min/{1.73_m2} (ref >59)
Albumin/Globulin Ratio: 1.2 (ref 1.2–2.2)
Albumin: 3.8 g/dL (ref 3.8–4.8)
Alkaline Phosphatase: 172 IU/L — ABNORMAL HIGH (ref 39–117)
BUN / Creatinine Ratio: 20 (ref 12–28)
BUN: 15 mg/dL (ref 8–27)
Bilirubin, Total: 0.2 mg/dL (ref 0.0–1.2)
CO2: 24 mmol/L (ref 20–29)
Calcium: 9.3 mg/dL (ref 8.7–10.3)
Chloride: 108 mmol/L — ABNORMAL HIGH (ref 96–106)
Creatinine: 0.76 mg/dL (ref 0.57–1.00)
Globulin, Total: 3.1 g/dL (ref 1.5–4.5)
Glucose: 105 mg/dL — ABNORMAL HIGH (ref 65–99)
Potassium: 4.1 mmol/L (ref 3.5–5.2)
Protein, Total: 6.9 g/dL (ref 6.0–8.5)
Sodium: 143 mmol/L (ref 134–144)
non-African American eGFR: 84 mL/min/{1.73_m2} (ref >59)

## 2019-05-10 LAB — LIPID PANEL
Cholesterol / HDL Ratio: 3 ratio (ref 0.0–4.4)
Cholesterol: 158 mg/dL (ref 100–199)
HDL: 52 mg/dL (ref >39)
LDL Chol Calculated (NIH): 87 mg/dL (ref 0–99)
Triglycerides: 107 mg/dL (ref 0–149)
VLDL Calculated: 19 mg/dL (ref 5–40)

## 2019-05-10 LAB — HEMOGLOBIN A1C: Hemoglobin A1C: 7.5 % — ABNORMAL HIGH (ref 4.8–5.6)

## 2019-05-11 ENCOUNTER — Encounter (INDEPENDENT_AMBULATORY_CARE_PROVIDER_SITE_OTHER): Payer: Self-pay | Admitting: Internal Medicine

## 2019-05-14 ENCOUNTER — Encounter (INDEPENDENT_AMBULATORY_CARE_PROVIDER_SITE_OTHER): Payer: Self-pay | Admitting: Internal Medicine

## 2019-06-04 ENCOUNTER — Encounter (INDEPENDENT_AMBULATORY_CARE_PROVIDER_SITE_OTHER): Payer: Self-pay | Admitting: Internal Medicine

## 2019-06-07 ENCOUNTER — Encounter (INDEPENDENT_AMBULATORY_CARE_PROVIDER_SITE_OTHER): Payer: Self-pay | Admitting: Internal Medicine

## 2019-06-26 ENCOUNTER — Encounter (INDEPENDENT_AMBULATORY_CARE_PROVIDER_SITE_OTHER): Payer: Self-pay | Admitting: Internal Medicine

## 2019-08-28 ENCOUNTER — Encounter (INDEPENDENT_AMBULATORY_CARE_PROVIDER_SITE_OTHER): Payer: Self-pay | Admitting: Internal Medicine

## 2019-09-16 ENCOUNTER — Ambulatory Visit (INDEPENDENT_AMBULATORY_CARE_PROVIDER_SITE_OTHER): Payer: HMO | Admitting: Internal Medicine

## 2019-09-16 ENCOUNTER — Encounter (INDEPENDENT_AMBULATORY_CARE_PROVIDER_SITE_OTHER): Payer: Self-pay | Admitting: Internal Medicine

## 2019-09-16 VITALS — BP 175/88 | HR 67 | Temp 97.8°F | Resp 20 | Ht 65.25 in | Wt 186.6 lb

## 2019-09-16 DIAGNOSIS — R109 Unspecified abdominal pain: Secondary | ICD-10-CM

## 2019-09-16 DIAGNOSIS — Z1231 Encounter for screening mammogram for malignant neoplasm of breast: Secondary | ICD-10-CM

## 2019-09-16 LAB — POCT URINALYSIS AUTOMATED (IAH)
Bilirubin, UA POCT: NEGATIVE
Blood, UA POCT: NEGATIVE
Glucose, UA POCT: NEGATIVE
Ketones, UA POCT: NEGATIVE mg/dL
Nitrite, UA POCT: NEGATIVE
PH, UA POCT: 7.5 (ref 4.6–8)
Protein, UA POCT: NEGATIVE mg/dL
Specific Gravity, UA POCT: 1.01 mg/dL (ref 1.001–1.035)
Urine Leukocytes POCT: NEGATIVE
Urobilinogen, UA POCT: 0.2 mg/dL

## 2019-09-16 NOTE — Progress Notes (Signed)
Subjective:      Patient ID: [name] is a 64 y.o. female     Chief Complaint   Patient presents with   . Abdominal Pain     right side for 3 weeks        HPI   Patient isa 64 year old Hispanic female with history of diabetes mellitus 2now followed by endocrinologist Dr. Benjiman Core, hypertension, hyperlipidemia, afib followed by cardiologist Dr. Fransisco Beau, among others, who is here todayforthe above.    Patient reports that for the last 3 weeks she has been having right-sided abdominal and flank pain.  Denies any constipation, diarrhea, melena, hematochezia, dysuria, hematuria, urinary frequency or urgency. Has not been taking anything for the symptoms.  No alleviating or aggravating factors but feels the pain has been getting worse.       Due for mammogram, would like order.     The following sections were reviewed this encounter by the provider:   Tobacco  Allergies  Meds  Problems  Med Hx  Surg Hx  Fam Hx         Past Medical History:   Diagnosis Date   . Diabetes mellitus type 2 in obese    . Diabetic retinopathy 06/28/2013    Of the right eye, per patient   . Glaucoma 06/01/2012   . Hyperlipidemia    . Hypertension    . Obesity    . Venous insufficiency 09/17/2014    Followed at [facility].  Wears compression stockings.        Social History     Socioeconomic History   . Marital status: Single     Spouse name: Not on file   . Number of children: 3   . Years of education: Not on file   . Highest education level: Not on file   Occupational History   . Occupation: Maintenance     Employer: [employer]   Tobacco Use   . Smoking status: Never Smoker   . Smokeless tobacco: Never Used   Vaping Use   . Vaping Use: Never used   Substance and Sexual Activity   . Alcohol use: No   . Drug use: No   . Sexual activity: Not Currently   Other Topics Concern   . Not on file   Social History Narrative    Originally from [place].  Lives with her daughter.    Exercise: walks frequently     Social Determinants of  Health     Financial Resource Strain:    . Difficulty of Paying Living Expenses:    Food Insecurity:    . Worried About Programme researcher, broadcasting/film/video in the Last Year:    . Barista in the Last Year:    Transportation Needs:    . Freight forwarder (Medical):    Marland Kitchen Lack of Transportation (Non-Medical):    Physical Activity:    . Days of Exercise per Week:    . Minutes of Exercise per Session:    Stress:    . Feeling of Stress :    Social Connections:    . Frequency of Communication with Friends and Family:    . Frequency of Social Gatherings with Friends and Family:    . Attends Religious Services:    . Active Member of Clubs or Organizations:    . Attends Banker Meetings:    Marland Kitchen Marital Status:    Intimate Partner Violence:    . Fear  of Current or Ex-Partner:    . Emotionally Abused:    Marland Kitchen Physically Abused:    . Sexually Abused:        Allergies   Allergen Reactions   . Shellfish-Derived Products        Current Outpatient Medications   Medication Sig Dispense Refill   . apixaban (ELIQUIS) 5 MG Take 5 mg by mouth every 12 (twelve) hours.     Marland Kitchen atorvastatin (LIPITOR) 40 MG tablet Take 1 tablet by mouth every evening     . Blood Glucose Monitoring Suppl (ACCU-CHEK AVIVA PLUS) w/Device Kit Check blood glucose level twice daily. ICD 10 Code E11.9, Z79.4 1 kit 1   . Calcium Carbonate-Vitamin D (CALCIUM-D PO) Take 1 tablet by mouth daily.     Marland Kitchen glucose blood (Accu-Chek Aviva) test strip Check blood glucose level twice daily. ICD 10 Code E11.9, Z79.4 200 each 2   . insulin NPH (NovoLIN N) 100 UNIT/ML injection INJECT 52 UNITS SUBCUTANEOUSLY WITH BREAKFST AND 24 UNITS WITH DINNER 30 mL 3   . Insulin Syringe-Needle U-100 (BD INSULIN SYRINGE ULTRAFINE) 31G X 5/16" 1 ML Misc ADMINSTER INSULIN UNDER THE SKIN 2 TIMES A DAY 300 each 2   . lidocaine (LIDODERM) 5 % Place 1 patch onto the skin every 24 hours Remove & Discard patch within 12 hours or as directed by MD 30 each 0   . lisinopril (ZESTRIL) 40 MG tablet Take 1  tablet (40 mg total) by mouth daily 90 tablet 1   . Lumigan 0.01 % Solution      . metFORMIN (GLUCOPHAGE) 1000 MG tablet Take 1 tablet (1,000 mg total) by mouth 2 (two) times daily with meals 180 tablet 1   . metoprolol succinate XL (TOPROL-XL) 50 MG 24 hr tablet Take 0.5 tablets (25 mg total) by mouth every evening 30 tablet 3     No current facility-administered medications for this visit.       Review of Systems   Constitutional: Negative for chills and fever.   Gastrointestinal: Positive for abdominal pain. Negative for constipation, diarrhea, nausea and vomiting.   Genitourinary: Negative for dysuria, frequency and hematuria.   Neurological: Negative for dizziness and light-headedness.        Objective:   BP 175/88 (BP Site: Left arm, Patient Position: Sitting, Cuff Size: Medium)   Pulse 67   Temp 97.8 F (36.6 C) (Oral)   Resp 20   Ht 1.657 m (5' 5.25")   Wt 84.6 kg (186 lb 9.6 oz)   SpO2 96%   BMI 30.81 kg/m     Physical Exam  Vitals reviewed.   Constitutional:       General: She is not in acute distress.     Appearance: She is well-developed. She is not diaphoretic.   Eyes:      General: No scleral icterus.     Conjunctiva/sclera: Conjunctivae normal.   Cardiovascular:      Rate and Rhythm: Normal rate and regular rhythm.      Heart sounds: No murmur heard.   No friction rub. No gallop.    Pulmonary:      Effort: Pulmonary effort is normal. No respiratory distress.      Breath sounds: No wheezing or rales.   Abdominal:      Palpations: Abdomen is soft. There is no mass.      Tenderness: There is abdominal tenderness (on deep palpation of right lateral abdomen w/o guarding or rebound tenderness). There is no guarding  or rebound.   Skin:     General: Skin is warm and dry.   Neurological:      Mental Status: She is alert and oriented to person, place, and time.   Psychiatric:         Behavior: Behavior normal.       POCT UA done today did not show any abnormalities    Lab Results   Component Value Date     HGBA1C 7.5 (H) 05/09/2019     Lab Results   Component Value Date    WBC 9.11 12/09/2018    HGB 13.7 12/09/2018    HCT 40.8 12/09/2018    MCV 89.1 12/09/2018    PLT 254 12/09/2018     '  Chemistry        Component Value Date/Time    NA 143 05/09/2019 0000    K 4.1 05/09/2019 0000    CL 108 (H) 05/09/2019 0000    CL 104 02/15/2014 0733    CO2 24 05/09/2019 0000    BUN 15 05/09/2019 0000    CREAT 0.76 05/09/2019 0000    CREAT 0.78 02/15/2014 0733    GLU 105 (H) 05/09/2019 0000    GLU 63 (L) 12/09/2018 0423        Component Value Date/Time    CA 9.3 05/09/2019 0000    ALKPHOS 172 (H) 05/09/2019 0000    AST 30 05/09/2019 0000    ALT 22 05/09/2019 0000    BILITOTAL 0.2 05/09/2019 0000          Lab Results   Component Value Date    CHOL 158 05/09/2019    CHOL 175 07/19/2018    CHOL 123 03/29/2018     Lab Results   Component Value Date    HDL 52 05/09/2019    HDL 70 07/19/2018    HDL 67 03/29/2018     Lab Results   Component Value Date    LDL 87 05/09/2019    LDL 89 07/19/2018    LDL 42 03/29/2018     Lab Results   Component Value Date    TRIG 107 05/09/2019    TRIG 81 07/19/2018    TRIG 70 03/29/2018     Lab Results   Component Value Date    TSH 0.98 12/07/2018    TSH 1.17 06/28/2013     No results found for: VITD, 25HYDROXYDTO     Assessment:     1. Right flank pain  - UA today is normal  - Will obtain CT scan of the abdomen/pelvis to further evauate  - POCT UA  Automated (urine dipstick)  - CT Abdomen Pelvis WO IV/ WO PO Cont; Future    2. Encounter for screening mammogram for malignant neoplasm of breast  - Mammo Digital Screening Bilateral W CAD; Future        Plan:     Plan is as above.     Patient to call to discuss CT abdomen 1-2 days after CT is done.     Return if symptoms worsen or fail to improve.    Standley Dakins, MD

## 2019-09-16 NOTE — Progress Notes (Signed)
Have you seen any specialists/other providers since your last visit with [place]?    Yes, cardiologist    Arm preference verified?   Yes    The patient is due for mammogram, pap smear, influenza vaccine, shingles vaccine and pneumonia vaccine, advance directive, [facility] letter.

## 2019-10-18 ENCOUNTER — Other Ambulatory Visit (INDEPENDENT_AMBULATORY_CARE_PROVIDER_SITE_OTHER): Payer: Self-pay | Admitting: Internal Medicine

## 2019-10-18 DIAGNOSIS — E669 Obesity, unspecified: Secondary | ICD-10-CM

## 2019-10-18 DIAGNOSIS — E1169 Type 2 diabetes mellitus with other specified complication: Secondary | ICD-10-CM

## 2019-10-18 MED ORDER — NOVOLIN N 100 UNIT/ML SC SUSP
SUBCUTANEOUS | 3 refills | Status: DC
Start: 2019-10-18 — End: 2020-03-23

## 2019-10-18 NOTE — Telephone Encounter (Signed)
Rx for insulin was sent to pharmacy

## 2019-10-18 NOTE — Telephone Encounter (Signed)
Patient called requesting refill for insulin NPH (NovoLIN N) 100 UNIT/ML injection  To be sent to [facility] on file.

## 2019-10-29 ENCOUNTER — Encounter (INDEPENDENT_AMBULATORY_CARE_PROVIDER_SITE_OTHER): Payer: Self-pay | Admitting: Internal Medicine

## 2019-12-19 ENCOUNTER — Encounter (INDEPENDENT_AMBULATORY_CARE_PROVIDER_SITE_OTHER): Payer: Self-pay | Admitting: Internal Medicine

## 2019-12-30 ENCOUNTER — Ambulatory Visit (INDEPENDENT_AMBULATORY_CARE_PROVIDER_SITE_OTHER): Payer: HMO | Admitting: Internal Medicine

## 2020-01-21 ENCOUNTER — Ambulatory Visit (INDEPENDENT_AMBULATORY_CARE_PROVIDER_SITE_OTHER): Payer: HMO | Admitting: Internal Medicine

## 2020-01-21 ENCOUNTER — Encounter (INDEPENDENT_AMBULATORY_CARE_PROVIDER_SITE_OTHER): Payer: Self-pay | Admitting: Internal Medicine

## 2020-01-21 VITALS — BP 171/82 | HR 76 | Temp 98.2°F | Resp 20 | Ht 65.0 in | Wt 190.0 lb

## 2020-01-21 DIAGNOSIS — E669 Obesity, unspecified: Secondary | ICD-10-CM

## 2020-01-21 DIAGNOSIS — Z5181 Encounter for therapeutic drug level monitoring: Secondary | ICD-10-CM

## 2020-01-21 DIAGNOSIS — I10 Essential (primary) hypertension: Secondary | ICD-10-CM

## 2020-01-21 DIAGNOSIS — E1169 Type 2 diabetes mellitus with other specified complication: Secondary | ICD-10-CM

## 2020-01-21 DIAGNOSIS — E782 Mixed hyperlipidemia: Secondary | ICD-10-CM

## 2020-01-21 DIAGNOSIS — R748 Abnormal levels of other serum enzymes: Secondary | ICD-10-CM

## 2020-01-21 DIAGNOSIS — Z23 Encounter for immunization: Secondary | ICD-10-CM

## 2020-01-21 NOTE — Progress Notes (Signed)
Have you seen any specialists/other providers since your last visit with [place]?    Yes    Arm preference verified?   Yes    The patient is due for pap smear, influenza vaccine, shingles vaccine and pneumonia vaccine, advance directive, [facility] letter, annual exam, urine microalbumin

## 2020-01-21 NOTE — Progress Notes (Signed)
Subjective:      Patient ID: [name] is a 63 y.o. female     Chief Complaint   Patient presents with   . Diabetes Follow-up   . Hypertension     discuss higher readings        HPI   Patient isa 64 year old Hispanic female with history of diabetes mellitus 2now followed by endocrinologist Dr. Benjiman Core, hypertension, hyperlipidemia, afib followed by cardiologist Dr. Fransisco Beau, among others, who is here todayforthe above.    DM2 -last A1c was 7.5% in 05/2019.  She reports compliance with all medications for diabetes as below.  Patient reports she has been checking her glucose in the mornings and readings have been in the 80s to 100s and bedtime glucose readings have been in the 200s.    She does not bring in her glucometer.  She denies any polydipsia, polyuria or paresthesias.    In regards to her history of hyperlipidemia, she is on therapy with Lipitor 40 mg daily which is prescribed by her cardiologist.    She expresses concerns about her blood pressure today.  She has been seeing her cardiologist, Dr. Fransisco Beau, for management of this.  She was seen over the last few weeks and dose of spironolactone was adjusted to 25 mg alternating with 50 mg every other day and she was continued on Lasix 40 mg daily.  She continues to take lisinopril 40 mg daily and Toprol-XL 50 mg daily.  She has been checking her blood pressures at home and reports they continue to be elevated with systolic readings in the 160s to 180s systolic and 80s diastolic.  She has been having less frequent headaches.  She has been experiencing atypical chest pain for which she has had a work-up with her cardiologist.  She experiences palpitations only with exertion.  She denies any increased lower extremity edema or shortness of breath.    She reports she has plans of retiring.    During her last visit, she reported abdominal pain and was given an order for CT of the abdomen but admits that she did not have this done due to concerns about the cost of the  study and since then the pain has resolved.    The following sections were reviewed this encounter by the provider:   Tobacco  Allergies  Meds  Problems  Med Hx  Surg Hx  Fam Hx         Past Medical History:   Diagnosis Date   . Diabetes mellitus type 2 in obese    . Diabetic retinopathy 06/28/2013    Of the right eye, per patient   . Glaucoma 06/01/2012   . Hyperlipidemia    . Hypertension    . Obesity    . Venous insufficiency 09/17/2014    Followed at [facility].  Wears compression stockings.        Social History     Socioeconomic History   . Marital status: Single   . Number of children: 3   Occupational History   . Occupation: Maintenance     Employer: [employer]   Tobacco Use   . Smoking status: Never Smoker   . Smokeless tobacco: Never Used   Vaping Use   . Vaping Use: Never used   Substance and Sexual Activity   . Alcohol use: No   . Drug use: No   . Sexual activity: Not Currently   Social History Narrative    Originally from [place].  Lives with her daughter.    Exercise: walks frequently       Allergies   Allergen Reactions   . Shellfish-Derived Products        Current Outpatient Medications   Medication Sig Dispense Refill   . apixaban (ELIQUIS) 5 MG Take 5 mg by mouth every 12 (twelve) hours.     Marland Kitchen atorvastatin (LIPITOR) 40 MG tablet Take 1 tablet by mouth every evening     . Blood Glucose Monitoring Suppl (ACCU-CHEK AVIVA PLUS) w/Device Kit Check blood glucose level twice daily. ICD 10 Code E11.9, Z79.4 1 kit 1   . Calcium Carbonate-Vitamin D (CALCIUM-D PO) Take 1 tablet by mouth daily.     Marland Kitchen glucose blood (Accu-Chek Aviva) test strip Check blood glucose level twice daily. ICD 10 Code E11.9, Z79.4 200 each 2   . insulin NPH (NovoLIN N) 100 UNIT/ML injection INJECT 52 UNITS SUBCUTANEOUSLY WITH BREAKFST AND 24 UNITS WITH DINNER 30 mL 3   . Insulin Syringe-Needle U-100 (BD INSULIN SYRINGE ULTRAFINE) 31G X 5/16" 1 ML Misc ADMINSTER INSULIN UNDER THE SKIN 2 TIMES A DAY 300 each 2   .  lidocaine (LIDODERM) 5 % Place 1 patch onto the skin every 24 hours Remove & Discard patch within 12 hours or as directed by MD 30 each 0   . lisinopril (ZESTRIL) 40 MG tablet Take 1 tablet (40 mg total) by mouth daily 90 tablet 1   . Lumigan 0.01 % Solution      . metFORMIN (GLUCOPHAGE) 1000 MG tablet Take 1 tablet (1,000 mg total) by mouth 2 (two) times daily with meals 180 tablet 1   . metoprolol succinate XL (TOPROL-XL) 50 MG 24 hr tablet Take 0.5 tablets (25 mg total) by mouth every evening 30 tablet 3     No current facility-administered medications for this visit.       Review of Systems   Constitutional: Positive for fatigue.   Respiratory: Negative for chest tightness and shortness of breath.    Cardiovascular: Positive for chest pain (atypical, left sides; has discussed w/ her cardiologist) and palpitations (only w/ exertion).   Gastrointestinal: Negative for abdominal pain, constipation, diarrhea, nausea and vomiting.   Endocrine: Negative for polydipsia and polyuria.   Musculoskeletal: Negative for myalgias.   Neurological: Positive for headaches. Negative for dizziness and light-headedness.      Objective:   BP 171/82 (BP Site: Left arm, Patient Position: Sitting, Cuff Size: Medium)   Pulse 76   Temp 98.2 F (36.8 C) (Oral)   Resp 20   Ht 1.651 m (5\' 5" )   Wt 86.2 kg (190 lb)   SpO2 98%   BMI 31.62 kg/m     Physical Exam  Vitals reviewed.   Constitutional:       General: She is not in acute distress.     Appearance: She is well-developed. She is not diaphoretic.   Eyes:      General: No scleral icterus.     Conjunctiva/sclera: Conjunctivae normal.   Cardiovascular:      Rate and Rhythm: Normal rate and regular rhythm.      Heart sounds: No murmur heard.  No friction rub. No gallop.    Pulmonary:      Effort: Pulmonary effort is normal. No respiratory distress.      Breath sounds: No wheezing or rales.   Abdominal:      Palpations: Abdomen is soft. There is no mass.      Tenderness: There is  no  abdominal tenderness.   Skin:     General: Skin is warm and dry.   Neurological:      Mental Status: She is alert and oriented to person, place, and time.   Psychiatric:         Behavior: Behavior normal.         Lab Results   Component Value Date    HGBA1C 7.5 (H) 05/09/2019     Lab Results   Component Value Date    WBC 9.11 12/09/2018    HGB 13.7 12/09/2018    HCT 40.8 12/09/2018    MCV 89.1 12/09/2018    PLT 254 12/09/2018     '  Chemistry        Component Value Date/Time    NA 143 05/09/2019 0000    K 4.1 05/09/2019 0000    CL 108 (H) 05/09/2019 0000    CL 104 02/15/2014 0733    CO2 24 05/09/2019 0000    BUN 15 05/09/2019 0000    CREAT 0.76 05/09/2019 0000    CREAT 0.78 02/15/2014 0733    GLU 105 (H) 05/09/2019 0000    GLU 63 (L) 12/09/2018 0423        Component Value Date/Time    CA 9.3 05/09/2019 0000    ALKPHOS 172 (H) 05/09/2019 0000    AST 30 05/09/2019 0000    ALT 22 05/09/2019 0000    BILITOTAL 0.2 05/09/2019 0000          Lab Results   Component Value Date    CHOL 158 05/09/2019    CHOL 175 07/19/2018    CHOL 123 03/29/2018     Lab Results   Component Value Date    HDL 52 05/09/2019    HDL 70 07/19/2018    HDL 67 03/29/2018     Lab Results   Component Value Date    LDL 87 05/09/2019    LDL 89 07/19/2018    LDL 42 03/29/2018     Lab Results   Component Value Date    TRIG 107 05/09/2019    TRIG 81 07/19/2018    TRIG 70 03/29/2018     Lab Results   Component Value Date    TSH 0.98 12/07/2018    TSH 1.17 06/28/2013     No results found for: VITD, 25HYDROXYDTO     Assessment:     1. Diabetes mellitus type 2 in obese  -Suboptimally controlled on last A1c, with goal of under 7%  -We will repeat the hemoglobin A1c to reevaluate and also check a urine microalbumin as she is due.  -As it appears that her glucose readings are elevated, advised her that she may increase her morning insulin from 52 units to 56 units.  She will continue the current evening dose of insulin.  - CBC and differential  - Hemoglobin A1C  -  Urine Microalbumin Random    2. Essential hypertension  -Uncontrolled and being managed by patient's cardiologist.  I did advise her to reach out given continuously elevated readings as she does not have an appointment with him until January  - CBC and differential    3. Mixed hyperlipidemia  - Well-controlled on last lipid panel that was last done  - Continue current statin medication  - Encourage heart healthy diet such as the mediterranean diet together with aerobic exercise for 30-45 minutes 4-5 days a week as tolerated  - Comprehensive metabolic panel  - Lipid panel  4. Elevated alkaline phosphatase level  -Noticed on last labs.  We will check a vitamin D to further evaluate  - Vitamin D,25 OH, Total    5. Encounter for medication monitoring  - Creatine Kinase (CK)    6. Need for influenza vaccination  - Flu vaccine HIGH-DOSE QUAD (PF) 65 yrs and older        Plan:     Plan is as above.     Return in about 3 months (around 04/20/2020) for annual exam.    Standley Dakins, MD

## 2020-01-22 LAB — CBC AND DIFFERENTIAL
Baso(Absolute): 0.1 10*3/uL (ref 0.0–0.2)
Basos: 1 %
Eos: 2 %
Eosinophils Absolute: 0.2 10*3/uL (ref 0.0–0.4)
Hematocrit: 40.1 % (ref 34.0–46.6)
Hemoglobin: 13.8 g/dL (ref 11.1–15.9)
Immature Granulocytes Absolute: 0 10*3/uL (ref 0.0–0.1)
Immature Granulocytes: 0 %
Lymphocytes Absolute: 2.4 10*3/uL (ref 0.7–3.1)
Lymphocytes: 25 %
MCH: 31.3 pg (ref 26.6–33.0)
MCHC: 34.4 g/dL (ref 31.5–35.7)
MCV: 91 fL (ref 79–97)
Monocytes Absolute: 0.8 10*3/uL (ref 0.1–0.9)
Monocytes: 8 %
Neutrophils Absolute: 6.4 10*3/uL (ref 1.4–7.0)
Neutrophils: 64 %
Platelets: 250 10*3/uL (ref 150–450)
RBC: 4.41 x10E6/uL (ref 3.77–5.28)
RDW: 12.7 % (ref 11.7–15.4)
WBC: 9.9 10*3/uL (ref 3.4–10.8)

## 2020-01-22 LAB — COMPREHENSIVE METABOLIC PANEL
ALT: 17 IU/L (ref 0–32)
AST (SGOT): 20 IU/L (ref 0–40)
African American eGFR: 84 mL/min/{1.73_m2} (ref >59)
Albumin/Globulin Ratio: 1.3 (ref 1.2–2.2)
Albumin: 4.2 g/dL (ref 3.8–4.8)
Alkaline Phosphatase: 109 IU/L (ref 44–121)
BUN / Creatinine Ratio: 18 (ref 12–28)
BUN: 15 mg/dL (ref 8–27)
Bilirubin, Total: 0.4 mg/dL (ref 0.0–1.2)
CO2: 30 mmol/L — ABNORMAL HIGH (ref 20–29)
Calcium: 9.6 mg/dL (ref 8.7–10.3)
Chloride: 104 mmol/L (ref 96–106)
Creatinine: 0.85 mg/dL (ref 0.57–1.00)
Globulin, Total: 3.2 g/dL (ref 1.5–4.5)
Glucose: 69 mg/dL (ref 65–99)
Potassium: 4.6 mmol/L (ref 3.5–5.2)
Protein, Total: 7.4 g/dL (ref 6.0–8.5)
Sodium: 145 mmol/L — ABNORMAL HIGH (ref 134–144)
non-African American eGFR: 73 mL/min/{1.73_m2} (ref >59)

## 2020-01-22 LAB — HEMOGLOBIN A1C: Hemoglobin A1C: 8 % — ABNORMAL HIGH (ref 4.8–5.6)

## 2020-01-22 LAB — LIPID PANEL
Cholesterol / HDL Ratio: 2.7 ratio (ref 0.0–4.4)
Cholesterol: 155 mg/dL (ref 100–199)
HDL: 58 mg/dL (ref >39)
LDL Chol Calculated (NIH): 79 mg/dL (ref 0–99)
Triglycerides: 99 mg/dL (ref 0–149)
VLDL Calculated: 18 mg/dL (ref 5–40)

## 2020-01-22 LAB — MICROALBUMIN, RANDOM URINE
Creatinine, UR: 51.7 mg/dL
Microalb/Crt. Ratio: 70 mg/g creat — ABNORMAL HIGH (ref 0–29)
Microalbumin, UR: 36.1 ug/mL

## 2020-01-22 LAB — CK: Creatinine Kinase, Total: 140 U/L (ref 32–182)

## 2020-01-22 LAB — VITAMIN D,25 OH,TOTAL: Vitamin D 25-Hydroxy: 29.3 ng/mL — ABNORMAL LOW (ref 30.0–100.0)

## 2020-02-25 ENCOUNTER — Other Ambulatory Visit (INDEPENDENT_AMBULATORY_CARE_PROVIDER_SITE_OTHER): Payer: Self-pay | Admitting: Internal Medicine

## 2020-02-25 DIAGNOSIS — I10 Essential (primary) hypertension: Secondary | ICD-10-CM

## 2020-02-27 ENCOUNTER — Encounter (INDEPENDENT_AMBULATORY_CARE_PROVIDER_SITE_OTHER): Payer: Self-pay | Admitting: Internal Medicine

## 2020-03-07 ENCOUNTER — Other Ambulatory Visit (INDEPENDENT_AMBULATORY_CARE_PROVIDER_SITE_OTHER): Payer: Self-pay | Admitting: Internal Medicine

## 2020-03-07 DIAGNOSIS — E1169 Type 2 diabetes mellitus with other specified complication: Secondary | ICD-10-CM

## 2020-03-07 DIAGNOSIS — E669 Obesity, unspecified: Secondary | ICD-10-CM

## 2020-03-23 ENCOUNTER — Encounter (INDEPENDENT_AMBULATORY_CARE_PROVIDER_SITE_OTHER): Payer: Self-pay | Admitting: Internal Medicine

## 2020-03-23 ENCOUNTER — Ambulatory Visit (INDEPENDENT_AMBULATORY_CARE_PROVIDER_SITE_OTHER): Payer: HMO | Admitting: Internal Medicine

## 2020-03-23 VITALS — BP 181/83 | HR 71 | Temp 97.5°F | Wt 189.0 lb

## 2020-03-23 DIAGNOSIS — E1169 Type 2 diabetes mellitus with other specified complication: Secondary | ICD-10-CM

## 2020-03-23 DIAGNOSIS — N814 Uterovaginal prolapse, unspecified: Secondary | ICD-10-CM

## 2020-03-23 DIAGNOSIS — E669 Obesity, unspecified: Secondary | ICD-10-CM

## 2020-03-23 MED ORDER — INSULIN SYRINGE-NEEDLE U-100 31G X 5/16" 1 ML MISC
2 refills | Status: DC
Start: 2020-03-23 — End: 2020-10-15

## 2020-03-23 MED ORDER — METFORMIN HCL 1000 MG PO TABS
1000.0000 mg | ORAL_TABLET | Freq: Two times a day (BID) | ORAL | 1 refills | Status: DC
Start: 2020-03-23 — End: 2021-04-22

## 2020-03-23 MED ORDER — NOVOLIN N 100 UNIT/ML SC SUSP
SUBCUTANEOUS | 3 refills | Status: DC
Start: 2020-03-23 — End: 2020-10-15

## 2020-03-23 MED ORDER — GLUCOSE BLOOD VI STRP
ORAL_STRIP | 2 refills | Status: DC
Start: 2020-03-23 — End: 2021-05-03

## 2020-03-23 NOTE — Progress Notes (Signed)
Subjective:      Patient ID: [name] is a 65 y.o. female     Chief Complaint   Patient presents with   . Vaginal Prolapse     office visit        HPI   Patient isa 65 year old Hispanic female with history of diabetes mellitus 2now followed by endocrinologist Dr. Benjiman Core, hypertension, hyperlipidemia, afib followed by cardiologist Dr. Fransisco Beau, among others, who is here todayforthe above.      Patient reports that she has been told by her gynecologist that she has a uterine prolapse.  More recently, she has had increased problems with discomfort in the vaginal area and has had to push her uterus back up.  She denies any urinary frequency, urgency, or incontinence.  Patient admits she lifts heavy things at work.  She had a total of 3 vaginal deliveries and her children weighed about 10 pounds at birth.  She was referred by her gynecologist to a specialist but felt that it was too far and is wondering if I can give her a referral to a different provider.    Patient's blood pressure today is markedly elevated, however she reports she has been checking her blood pressures at home regularly and states the readings are in the 130s systolic.  She also reports her blood pressure was "normal" at the most recent visit with her cardiologist.    Patient is requesting refills on the medications that are usually prescribed by me.     The following sections were reviewed this encounter by the provider:   Tobacco  Allergies  Meds  Problems  Med Hx  Surg Hx  Fam Hx         Past Medical History:   Diagnosis Date   . Diabetes mellitus type 2 in obese    . Diabetic retinopathy 06/28/2013    Of the right eye, per patient   . Glaucoma 06/01/2012   . Hyperlipidemia    . Hypertension    . Obesity    . Venous insufficiency 09/17/2014    Followed at [facility].  Wears compression stockings.        Social History     Socioeconomic History   . Marital status: Single   . Number of children: 3   Occupational History   .  Occupation: Maintenance     Employer: [employer]   Tobacco Use   . Smoking status: Never Smoker   . Smokeless tobacco: Never Used   Vaping Use   . Vaping Use: Never used   Substance and Sexual Activity   . Alcohol use: No   . Drug use: No   . Sexual activity: Not Currently   Social History Narrative    Originally from [place].  Lives with her daughter.    Exercise: walks frequently       Allergies   Allergen Reactions   . Shellfish-Derived Products        Current Outpatient Medications   Medication Sig Dispense Refill   . apixaban (ELIQUIS) 5 MG Take 5 mg by mouth every 12 (twelve) hours.     Marland Kitchen atorvastatin (LIPITOR) 40 MG tablet Take 1 tablet by mouth every evening     . Blood Glucose Monitoring Suppl (ACCU-CHEK AVIVA PLUS) w/Device Kit Check blood glucose level twice daily. ICD 10 Code E11.9, Z79.4 1 kit 1   . Calcium Carbonate-Vitamin D (CALCIUM-D PO) Take 1 tablet by mouth daily.     . furosemide (LASIX)  40 MG tablet Take 1 tablet by mouth daily     . glucose blood (Accu-Chek Aviva) test strip Check blood glucose level twice daily. ICD 10 Code E11.9, Z79.4 200 each 2   . insulin NPH (NovoLIN N) 100 UNIT/ML injection INJECT 52 UNITS SUBCUTANEOUSLY WITH BREAKFST AND 24 UNITS WITH DINNER 30 mL 3   . Insulin Syringe-Needle U-100 (BD INSULIN SYRINGE ULTRAFINE) 31G X 5/16" 1 ML Misc ADMINSTER INSULIN UNDER THE SKIN 2 TIMES A DAY 300 each 2   . lisinopril (ZESTRIL) 40 MG tablet TAKE ONE TABLET BY MOUTH DAILY 90 tablet 1   . Lumigan 0.01 % Solution      . metFORMIN (GLUCOPHAGE) 1000 MG tablet TAKE ONE TABLET BY MOUTH TWICE A DAY WITH MEALS 180 tablet 0   . metoprolol succinate XL (TOPROL-XL) 50 MG 24 hr tablet Take 0.5 tablets (25 mg total) by mouth every evening (Patient taking differently: Take 50 mg by mouth every evening   ) 30 tablet 3   . spironolactone (ALDACTONE) 25 MG tablet Take 1 tablet by mouth Take 25mg  alternating with 50mg  every other day         No current facility-administered medications for this visit.        Review of Systems   Genitourinary: Negative for dysuria, frequency, hematuria, pelvic pain, urgency, vaginal discharge and vaginal pain.        Objective:   BP 181/83 (BP Site: Right arm, Patient Position: Sitting, Cuff Size: Medium)   Pulse 71   Temp 97.5 F (36.4 C) (Oral)   Wt 85.7 kg (189 lb)   BMI 31.45 kg/m     Physical Exam  Vitals reviewed.   Constitutional:       General: She is not in acute distress.     Appearance: She is well-developed. She is not diaphoretic.   Eyes:      General: No scleral icterus.     Conjunctiva/sclera: Conjunctivae normal.   Cardiovascular:      Rate and Rhythm: Normal rate and regular rhythm.      Heart sounds: No murmur heard.  No friction rub. No gallop.    Pulmonary:      Effort: Pulmonary effort is normal. No respiratory distress.      Breath sounds: No wheezing or rales.   Abdominal:      Palpations: Abdomen is soft. There is no mass.      Tenderness: There is no abdominal tenderness.   Skin:     General: Skin is warm and dry.   Neurological:      Mental Status: She is alert and oriented to person, place, and time.   Psychiatric:         Behavior: Behavior normal.         Lab Results   Component Value Date    HGBA1C 8.0 (H) 01/21/2020     Lab Results   Component Value Date    WBC 9.9 01/21/2020    HGB 13.8 01/21/2020    HCT 40.1 01/21/2020    MCV 91 01/21/2020    PLT 250 01/21/2020     '  Chemistry        Component Value Date/Time    NA 145 (H) 01/21/2020 0000    K 4.6 01/21/2020 0000    CL 104 01/21/2020 0000    CL 104 02/15/2014 0733    CO2 30 (H) 01/21/2020 0000    BUN 15 01/21/2020 0000    CREAT 0.85 01/21/2020  0000    CREAT 0.78 02/15/2014 0733    GLU 69 01/21/2020 0000    GLU 63 (L) 12/09/2018 0423        Component Value Date/Time    CA 9.6 01/21/2020 0000    ALKPHOS 109 01/21/2020 0000    AST 20 01/21/2020 0000    ALT 17 01/21/2020 0000    BILITOTAL 0.4 01/21/2020 0000          Lab Results   Component Value Date    CHOL 155 01/21/2020    CHOL 158 05/09/2019     CHOL 175 07/19/2018     Lab Results   Component Value Date    HDL 58 01/21/2020    HDL 52 05/09/2019    HDL 70 07/19/2018     Lab Results   Component Value Date    LDL 79 01/21/2020    LDL 87 05/09/2019    LDL 89 07/19/2018     Lab Results   Component Value Date    TRIG 99 01/21/2020    TRIG 107 05/09/2019    TRIG 81 07/19/2018     Lab Results   Component Value Date    TSH 0.98 12/07/2018    TSH 1.17 06/28/2013     Lab Results   Component Value Date    VITD 29.3 (L) 01/21/2020        Assessment:     1. Uterine prolapse  -Recommend seeing-year-old gynecologist and referral has been given.  - Urogynecology Referral: Marlowe Aschoff, MD [facility])       Plan:     Plan is as above.     Refills for requested medications have been sent to pharmacy.    Return in about 2 weeks (around 04/06/2020) for f/u diabetes.    Standley Dakins, MD

## 2020-03-23 NOTE — Progress Notes (Signed)
Have you seen any specialists/other providers since your last visit with [place]?  Yes    Arm preference verified?  Yes    The patient is do for shingles vaccine and Advance direcive on file, High risk pneumonia

## 2020-04-27 ENCOUNTER — Encounter (INDEPENDENT_AMBULATORY_CARE_PROVIDER_SITE_OTHER): Payer: Self-pay | Admitting: Internal Medicine

## 2020-05-08 ENCOUNTER — Encounter (INDEPENDENT_AMBULATORY_CARE_PROVIDER_SITE_OTHER): Payer: HMO | Admitting: Internal Medicine

## 2020-06-30 ENCOUNTER — Encounter (INDEPENDENT_AMBULATORY_CARE_PROVIDER_SITE_OTHER): Payer: Self-pay | Admitting: Internal Medicine

## 2020-08-03 ENCOUNTER — Encounter (INDEPENDENT_AMBULATORY_CARE_PROVIDER_SITE_OTHER): Payer: Self-pay

## 2020-08-03 ENCOUNTER — Ambulatory Visit (INDEPENDENT_AMBULATORY_CARE_PROVIDER_SITE_OTHER): Payer: HMO

## 2020-08-03 VITALS — BP 189/95 | HR 75 | Wt 193.2 lb

## 2020-08-03 DIAGNOSIS — Z4689 Encounter for fitting and adjustment of other specified devices: Secondary | ICD-10-CM

## 2020-08-03 DIAGNOSIS — N952 Postmenopausal atrophic vaginitis: Secondary | ICD-10-CM

## 2020-08-03 DIAGNOSIS — N813 Complete uterovaginal prolapse: Secondary | ICD-10-CM

## 2020-08-03 MED ORDER — ESTRADIOL 0.1 MG/GM VA CREA
0.4000 g | TOPICAL_CREAM | VAGINAL | 3 refills | Status: DC
Start: 2020-08-03 — End: 2020-10-14

## 2020-08-03 NOTE — Progress Notes (Signed)
Subjective:       Patient ID:   [name] is a 65y/o G35P3 female who presents today for symptoms of prolapse that have gotten progressively worse in the last 8 months. Originally she felt a small ball in her vagina and now she feels like she has to press her uterus back up into her pelvis. She has no issues with urinating and denies any pain. She is not currently sexually active and has had no issues with intercourse in the past. 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 Her daughter is here to help translate medical terminology and vocabulary.     The following portions of the patient's history were reviewed and updated as appropriate: allergies, current medications, past family history, past medical history, past social history, past surgical history, and problem list.    Review of Systems   Genitourinary:  Positive for pelvic pain.   All other systems reviewed and are negative.     Objective:    Physical Exam  Vitals reviewed. Exam conducted with a chaperone present.   Constitutional:       Appearance: Normal appearance. She is overweight.   HENT:      Head: Normocephalic.      Nose: Nose normal.      Mouth/Throat:      Mouth: Mucous membranes are moist.      Pharynx: Oropharynx is clear.   Eyes:      Pupils: Pupils are equal, round, and reactive to light.   Cardiovascular:      Rate and Rhythm: Normal rate and regular rhythm.   Pulmonary:      Effort: Pulmonary effort is normal.   Abdominal:      Palpations: Abdomen is soft.   Genitourinary:     General: Normal vulva.      Rectum: Normal.   Musculoskeletal:         General: Normal range of motion.      Cervical back: Normal range of motion.   Skin:     General: Skin is warm.      Capillary Refill: Capillary refill takes less than 2 seconds.   Neurological:      General: No focal deficit present.      Mental Status: She is alert and oriented to person, place, and time. Mental status is at baseline.    Psychiatric:         Mood and Affect: Mood normal.         Behavior: Behavior normal.         Thought Content: Thought content normal.         Judgment: Judgment normal.     Physical Exam  Pelvic floor examination (PFE):     Yes  Cough Reflex  Present   Yes  Vulva Normal:         No   Vulva / Labia tenderness  No   Inner thigh tenderness:        Yes  Bartholin's Glands Normal             Yes  Urethra Normal   Yes  Normal Bladder Neck         No   Stress incontinence demonstrated  No   Bladder tenderness present            Yes   Vaginal Atrophy  Yes  Normal vaginal capacity:          Yes  Normal vaginal mobility:           Yes  Normal vaginal discharge:      No  Normal Cervix -redness on cervix                                                              Yes  Normal Adnexal Exam  No   Involuntary pelvic floor contraction  No   Involuntary pelvic floor relaxation       PELVIC ORGAN PROLAPSE QUANTIFICATION (Valsalva)  +2 Aa  (anterior wall 3 cm from hymen)   (-3 to +3)      +2 Ba  (most dependent part of rest of anterior wall (-3 to TVL)   +3 C   (cervix or vaginal cuff)     (TVL)         +3 D   (posterior fornix - if no hysterectomy)   (TVL)         +2 Ap (Posterior wall 3 cm from hymen)   (-3 to +3)      +2 Bp (most dependent part of rest of posterior wall) (-3 to TVL)  3 GH (genital hiatus-midurethra to PB)   (no limit)  3 PB (Perineal body - PB to mid anus)   (no limit)       9 TVL (total vaginal length non straining)  (no limit)       yes  Perinium Protrussion/Descent                      Pessary Fitting:     After obtaining appropriate verbal consents, the patient was placed in dorsal lithotomy position.  The introitus and vagina were calibrated and the patient was fitted with a number 6 ring pessary with support.  Instructed that it is best to remove nightly, clean and replace in AM but in select patients the pessary can stay in place for up to three months if the patient doesn't have the dexterity to remove  the pessary and come back to [place] for cleaning and removal.    POST VOID RESIDUAL:     Insertion of non-indwelling bladder catheter (eg, straight catheterization for residual urine). The patient was catherized with a small straight cath and PVR was measured. PVR was 60ml of clear yellow urine. Urine dipstick was negative for any s/s of infection.      Assessment:   1. Complete uterovaginal prolapse    2. Vaginal atrophy    Plan:   Plan for patient to utilize pessary until she can be scheduled for surgery. We discussed the several options and she reports that she's open to a hysterectomy and an A&P repair. I explained to the patient and her daughter that we started with a size 6 pessary with ring support and that it is possible she could use the size 7. If the pessary were to fall out she can return to clinic for an upsize or to have it replaced. Vaginal estrogen ordered to help with the integrity of the vaginal mucosa and prevent breakdown of the skin around the cervix.   Plan to follow up in 3 months for pessary check and schedule surgery.   Can follow up sooner if issues should arise.     I spent  30 minutes with this new patient, >50% spent on face to face counseling and care coordination.   PFSH: Patients current medications were reviewed.  The ROS included at least 2-9 systems  The exam included at least 5-7 body systems  The medical decision making was low and included 1 stable chronic or uncomplicated acute illness with systematic symptoms and/or OTC drug management or PT.     Kellar Westberg E. Biran Mayberry MSN, FNP-C  [facility]  [address]  [place], [place]

## 2020-08-12 ENCOUNTER — Encounter (INDEPENDENT_AMBULATORY_CARE_PROVIDER_SITE_OTHER): Payer: Self-pay

## 2020-08-13 ENCOUNTER — Encounter (INDEPENDENT_AMBULATORY_CARE_PROVIDER_SITE_OTHER): Payer: Self-pay | Admitting: Internal Medicine

## 2020-09-11 ENCOUNTER — Other Ambulatory Visit: Payer: Self-pay

## 2020-09-11 DIAGNOSIS — N813 Complete uterovaginal prolapse: Secondary | ICD-10-CM

## 2020-10-08 ENCOUNTER — Encounter (INDEPENDENT_AMBULATORY_CARE_PROVIDER_SITE_OTHER): Payer: Self-pay | Admitting: Internal Medicine

## 2020-10-09 ENCOUNTER — Encounter (INDEPENDENT_AMBULATORY_CARE_PROVIDER_SITE_OTHER): Payer: Self-pay | Admitting: Female Pelvic Medicine and Reconstructive Surgery

## 2020-10-09 ENCOUNTER — Ambulatory Visit (INDEPENDENT_AMBULATORY_CARE_PROVIDER_SITE_OTHER): Payer: Medicare PPO | Admitting: Female Pelvic Medicine and Reconstructive Surgery

## 2020-10-09 VITALS — BP 135/69 | HR 79 | Wt 178.8 lb

## 2020-10-09 DIAGNOSIS — N952 Postmenopausal atrophic vaginitis: Secondary | ICD-10-CM

## 2020-10-09 DIAGNOSIS — N813 Complete uterovaginal prolapse: Secondary | ICD-10-CM

## 2020-10-09 NOTE — Progress Notes (Signed)
 Subjective:       Patient ID: Miranda [name] is a 65 y.o. female presenting for follow up on her POP and discuss surgical options.    Her symptoms of prolapse have gotten progressively worse in the last 8 months. Originally she felt a small ball in her vagina and now she feels like she has to press her uterus back up into her pelvis. She has no issues with urinating and denies any pain. She is not currently sexually active and has had no issues with intercourse in the past. 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     She is scheduled for RATLH, anterior/posterior repair on 10/27/2020.    Discussed with the patient that due to her extensive medical history and uncontrolled diabetes, we would recommend against placing a mesh due to the poor healing of her tissue.   Offered colpocleisis to the patient as an alternative option and patient is in agreement with the colpocleisis. Discussed inability to perform penetrative vaginal sexual intercourse in the future and the patient states that she is not currently sexually active (has not been in the past 14 years) and is not interested in future sexual activity.      Review of Systems as above        Objective:    Physical Exam    Physical Exam   Nursing note and vitals reviewed.  Constitutional: She is oriented to person, place, and time. She appears well-developed and well-nourished.   HENT:   Head: Normocephalic and atraumatic.   Neck: Normal range of motion.   Cardiovascular: Normal rate.    Pulmonary/Chest: Effort normal.   Abdominal: Soft.   Musculoskeletal: Normal range of motion. Normal back, non-tender  Neurological: She is alert and oriented to person, place, and time. She has normal reflexes.   Skin: Skin is warm and dry.   Psychiatric: She has a normal mood and affect. Her behavior is normal. Judgment and thought content normal.     PELVIC ORGAN PROLAPSE QUANTIFICATION (Valsalva)  +3        Aa  (anterior wall  3 cm from hymen)                          (-3 to +3)      +3        Ba  (most dependent part of rest of anterior wall       (-3 to TVL)   +3        C   (cervix or vaginal cuff)                                           (TVL)         +3        D   (posterior fornix - if no hysterectomy)                  (TVL)         +2        Ap (Posterior wall 3 cm from hymen)                         (-3 to +3)      +2  Bp (most dependent part of rest of posterior wall)     (-3 to TVL)  3         GH (genital hiatus-midurethra to PB)                         (no limit)  4        PB (Perineal body - PB to mid anus)                         (no limit)       8         TVL (total vaginal length non straining)                      (no limit)       yes      Perinium Protrussion/Descent            Pessary insertion:     After obtaining appropriate verbal consents, the patient was placed in dorsal lithotomy position.  The number 6 ring pessary with support was cleaned and inserted without difficulty.  Instructed that it is best to remove nightly, clean and replace in AM but in select patients the pessary can stay in place for up to three months if the patient doesn't have the dexterity to remove the pessary and come back to us  for cleaning and removal.        Assessment:       1. Vaginal atrophy    2. Complete uterovaginal prolapse        Plan:      Procedures  No orders of the defined types were placed in this encounter.      - new surgical posting sent today for colpocleisis - sent a message to Miranda [name] about the change in procedure  - IUGA info about colpocleisis given to patient in spanish today  - patient to come back for UDS prior to her surgery date  - awaiting medical clearance from her PCP/pre-op surgical visit.      [name] spent 15 minutes with this returning patient, >50% spent on face to face counseling and care coordination.   The ROS included at least one system  The exam included at least 2-4 body systems  The medical  decision making was low and included one stable chronic or uncomplicated illness.        ATTESTATION: [name] am attesting that [name] have personally reviewed the social, family, surgical, medical, medications, allergies, ROS entered by the support staff.[name] have seen and examined the patient.  [name] have personally reviewed the images or testing and the resident/fellow's interpretation and agreed or edited the findings.   Miranda Clunes, MD, [facility]     [facility]  [address]  [place] [address]  Phone: [phone]  Fax: [phone]     Physician to complete ALL information above patient name and send to Scheduling Pool  Desired Date and Time  First Choice:    Date:    Time:  Second Choice:    Date:    Time:  Third Choice:    Date:    Time:    Desired Location: [facility]        Surgeon Name:   Miranda Bacon, MD Amt of time requested: 2 hours Resident/RNFA required:  yes   Procedure(s): [place] colpocleisis,  possible sling and cystoscopy CPT Code(s):     PreOp Diagnosis/ICD-10 Code(s):       ICD-10-CM     1. Vaginal atrophy  N95.2         2. Complete uterovaginal prolapse  N81.3             Special Equipment Request:  OTHER: N/A   Anesthesia Requested:  Gen Type of Admission:  OP   Special Bed/Room needed: no Special Needs:  no   Patient Pregnant:   no Latex Allergy:  no   Clinic Use Only:  Bowel Prep Needed:  no Medical Clearance Needed: yes   Patient Name: Miranda [name] MR#: [id number]   DOB: [birth year] SS#: [id number]   Home Address:    [address]  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 Ins. ID Effective Group Num   1. MEDICARE [facility] Miranda [name], Miranda [name] Sep 27, 1955 Female Self [id number] 10/08/20 [id number]                                   [address] [address]        Primary Ins Phone: [phone]        Secondary Ins Phone:    Person  sending request:    Dr. Leavy Prow

## 2020-10-14 ENCOUNTER — Ambulatory Visit (INDEPENDENT_AMBULATORY_CARE_PROVIDER_SITE_OTHER): Payer: Medicare PPO | Admitting: Physician Assistant

## 2020-10-14 VITALS — BP 157/89 | HR 83 | Ht 66.0 in | Wt 178.0 lb

## 2020-10-14 DIAGNOSIS — E119 Type 2 diabetes mellitus without complications: Secondary | ICD-10-CM

## 2020-10-14 DIAGNOSIS — N813 Complete uterovaginal prolapse: Secondary | ICD-10-CM

## 2020-10-14 DIAGNOSIS — Z8616 Personal history of COVID-19: Secondary | ICD-10-CM | POA: Insufficient documentation

## 2020-10-14 DIAGNOSIS — I2510 Atherosclerotic heart disease of native coronary artery without angina pectoris: Secondary | ICD-10-CM | POA: Insufficient documentation

## 2020-10-14 DIAGNOSIS — I480 Paroxysmal atrial fibrillation: Secondary | ICD-10-CM

## 2020-10-14 DIAGNOSIS — I872 Venous insufficiency (chronic) (peripheral): Secondary | ICD-10-CM

## 2020-10-14 DIAGNOSIS — Z794 Long term (current) use of insulin: Secondary | ICD-10-CM

## 2020-10-14 DIAGNOSIS — I739 Peripheral vascular disease, unspecified: Secondary | ICD-10-CM | POA: Insufficient documentation

## 2020-10-14 DIAGNOSIS — I272 Pulmonary hypertension, unspecified: Secondary | ICD-10-CM | POA: Insufficient documentation

## 2020-10-14 DIAGNOSIS — R911 Solitary pulmonary nodule: Secondary | ICD-10-CM | POA: Insufficient documentation

## 2020-10-14 DIAGNOSIS — I10 Essential (primary) hypertension: Secondary | ICD-10-CM

## 2020-10-14 DIAGNOSIS — Z01818 Encounter for other preprocedural examination: Secondary | ICD-10-CM | POA: Insufficient documentation

## 2020-10-14 DIAGNOSIS — E782 Mixed hyperlipidemia: Secondary | ICD-10-CM

## 2020-10-14 DIAGNOSIS — H4010X0 Unspecified open-angle glaucoma, stage unspecified: Secondary | ICD-10-CM

## 2020-10-14 DIAGNOSIS — K760 Fatty (change of) liver, not elsewhere classified: Secondary | ICD-10-CM

## 2020-10-14 DIAGNOSIS — I340 Nonrheumatic mitral (valve) insufficiency: Secondary | ICD-10-CM

## 2020-10-14 DIAGNOSIS — I4891 Unspecified atrial fibrillation: Secondary | ICD-10-CM | POA: Insufficient documentation

## 2020-10-14 DIAGNOSIS — I5030 Unspecified diastolic (congestive) heart failure: Secondary | ICD-10-CM | POA: Insufficient documentation

## 2020-10-14 NOTE — Pre-Procedure Instructions (Signed)
Important Instructions Before Your Procedure        Your case is currently scheduled for 10/27/2020 at [facility] OR with SHOBEIRI, SEYED A.      The date and/or time of your surgery may change.  Your surgeon's office will notify you, up until the business day before surgery, if there is any change to your surgery date or time.  Please don't hesitate to call your surgeon's office directly with any questions.        IMPORTANT You must visit the Preparing for Your Procedure guide link below for additional instructions including fasting guidelines and directions before your procedure. If you received fasting or skin preparation instructions from your surgeon or pre-procedural provider, please follow those specific instructions. The instructions here are general instructions that do not pertain to all patients.  [email or web address]    If the link doesn't open, please copy and paste in your browser    QUESTIONS?  If you have any questions prior to your procedure, please call [phone] and leave a message. This voice mail is monitored between 7am and 3pm Monday through Friday.

## 2020-10-14 NOTE — PEC In-Person Visit (H&P) (Addendum)
 Pre-Anesthesia Evaluation     Pre-op Interview visit requested by: Laurena Spies, MD  Reason for pre-op interview visit: Patient anticipating LEFORT COLPOCLEISIS, CYSTOSCOPY, POSSIBLE SLING procedure.    History of Present Illness/Summary:  Patient presents to the [facility] clinic for a pre-operative evaluation.    Assessment/Plan:    Encounter for Preoperative Examination (N82.956)      Patient evaluated in PEC today. Patient is at elevated risk for perioperative CV complications 2/2 HFpEF and T2DM.  Encounter will be finalize pending repeat echo.  Echo ordered today.      2. Surgical Diagnosis  Uterovaginal prolapse, complete    CARDIAC:  HTN - asymptomatic today. SBP in elevated. Reports that checks BP at home.  Pt is having follow up appointment tomorrow with her PCP.    Diastolic HF dx 2020  Euvolemic on exam   In PEC denies sxs of dyspnea, fatigue, edema, congestion, or arrhythmia.  Most recent BNP 135    PAF   CHADS2-VASC score is 3  Per last cardiology encounter, safe to hold Eliquis 2-3 prior DOS and restarted the day after surgery as directed by the surgical service.    Pulmonary HTN:  On echo 04/2018: pulmonary artery pressure up to 56 mmHg; pulm pressure was down to 35 on echo from 11/2018.    On echo from 12/2019, it was back up to 52 mmHg.  Per cardiologist last visit, diuretics (Furosemide 60 mg)was increased and echo scheduled for repeat on 11/2020.   Echo ordered today to repeat prior DOS to re-access pulmonary artery pressure.     Most Recent Cardiac/Imaging Studies:  ECG 10/08/2020:  A.Fib; abnormal rhythm,  No changes compare to ECG from 12/18/2019    ECHO 12/2019: normal LV and RV size and systolic function, LVEF .. %, No LVH, No WMA, mild mitral regurgitation and moderate tricuspid regurgitation.    NM Stress test 11/2018 : no ischemia    LHC 2002 > 30% left circumflex stenosis      DM II on insulin  Currently pt managed by her PCP, F/U was on 10/15/2020.  Uses 53 units in the AM if BG > 125;  uses 23 units every PM per endocrinologist.  Per anesthesia guidelines, instructed to use 1/3 of AM dose DOS if BG >125; if <125 hold it.  Most recent HgA1C 8.0 on 04/2020;  Pt refused to recheck her HgA1C today 2/2 she has appointment with her PCP tomorrow and prefers to draw her lab there.   ~ Discussed with patient importance of glucose control and the risk of perioperative complications such as delayed wound healing, vascular perfusion to tissue, peripheral nerve injury, and soft tissue compromise.       Recent LABs:  reviewed all the labs for this encounter    Patient voiced understanding of all instructions.  All questions and concerns addressed at this time. This assessment will be conveyed to the surgery and anesthesiology teams & the patient will be evaluated the morning of surgery.    Problem List:  Medical Problems       Hospital Problem List  Date Reviewed: 10/14/2020   None        Non-Hospital Problem List  Date Reviewed: 10/14/2020            ICD-10-CM Priority Class Noted    Uterovaginal prolapse, complete N81.3 High  09/11/2020    Overview Signed 09/11/2020 10:53 AM by Broadus John     Added automatically from request for surgery [phone]  Glaucoma H40.9   06/01/2012    Type 2 diabetes mellitus without complication, with long-term current use of insulin E11.9, Z79.4   Unknown    Hypertension I10   Unknown    Hyperlipidemia E78.5   Unknown    Fatty liver K76.0   06/18/2012    Overview Addendum 11/23/2016 12:33 PM by Standley Dakins, MD     Seen on liver US done July 2017         Non-rheumatic mitral regurgitation I34.0   02/14/2014    Overview Signed 02/14/2014 12:05 PM by Standley Dakins, MD     Followed by Dr. Fransisco Beau (Cardiology)         Venous insufficiency I87.2   09/17/2014    Overview Signed 09/17/2014  1:00 PM by Standley Dakins, MD     Followed at [facility].  Wears compression stockings.          CAD (coronary artery disease) I25.10   10/14/2020    Atrial  fibrillation I48.91   10/14/2020    Diastolic heart failure I50.30   10/14/2020    Nodule of right lung R91.1   10/14/2020    Overview Addendum 10/14/2020  6:10 PM by Girtha Hake, PA     nodule seen on right upper lung field, which on CT scan appears to be scarring 2021. Followed by PCP.           Pulmonary hypertension I27.20   10/14/2020    Overview Deleted 10/15/2020  4:55 PM by Girtha Hake, PA              History of COVID-19 Z86.16   10/14/2020    Overview Signed 10/14/2020  6:04 PM by Girtha Hake, PA     Early 09/2020 > symptoms resolved         Pre-op evaluation Z01.818   10/14/2020        Medical History   Diagnosis Date    Congenital anomaly of heart     Diabetes mellitus type 2 in obese     Diabetic retinopathy 06/28/2013    Of the right eye, per patient    Glaucoma 06/01/2012    Hyperlipidemia     Hypertension     Obesity     Venous insufficiency 09/17/2014    Followed at [facility].  Wears compression stockings.      Past Surgical History:   Procedure Laterality Date    BREAST CYST EXCISION  1993    bilateral    TUBAL LIGATION         Current Outpatient Medications:     atorvastatin (LIPITOR) 40 MG tablet, Take 1 tablet by mouth every evening, Disp: , Rfl:     Calcium Carbonate-Vitamin D (CALCIUM-D PO), Take 1 tablet by mouth daily., Disp: , Rfl:     furosemide (LASIX) 40 MG tablet, Take 1 tablet by mouth daily Patient takes two pills total 60 mg (Lasix 20 mg and Lasix 40 mg)., Disp: , Rfl:     lisinopril (ZESTRIL) 40 MG tablet, TAKE ONE TABLET BY MOUTH DAILY, Disp: 90 tablet, Rfl: 1    metFORMIN (GLUCOPHAGE) 1000 MG tablet, Take 1 tablet (1,000 mg total) by mouth 2 (two) times daily with meals, Disp: 180 tablet, Rfl: 1    metoprolol succinate XL (TOPROL-XL) 25 MG 24 hr tablet, Take 25 mg by mouth daily, Disp: , Rfl:     spironolactone (ALDACTONE) 50 MG tablet, every 24 hours Alternate 25mg  with  50mg  every other day, Disp: , Rfl:     timolol (TIMOPTIC) 0.5 % ophthalmic solution, 1 drop 2  (two) times daily, Disp: , Rfl:     apixaban (ELIQUIS) 5 MG, Take 1 tablet (5 mg total) by mouth every 12 (twelve) hours, Disp: 180 tablet, Rfl: 0    Blood Glucose Monitoring Suppl (ACCU-CHEK AVIVA PLUS) w/Device Kit, Check blood glucose level twice daily. ICD 10 Code E11.9, Z79.4, Disp: 1 kit, Rfl: 1    glucose blood (Accu-Chek Aviva) test strip, Check blood glucose level twice daily. ICD 10 Code E11.9, Z79.4, Disp: 200 each, Rfl: 2    insulin NPH (NovoLIN N) 100 UNIT/ML injection, INJECT 50 UNITS SUBCUTANEOUSLY WITH BREAKFST AND 20 UNITS WITH DINNER, Disp: 60 mL, Rfl: 3    Insulin Syringe-Needle U-100 (BD INSULIN SYRINGE ULTRAFINE) 31G X 5/16" 1 ML Misc, ADMINSTER INSULIN UNDER THE SKIN 2 TIMES A DAY, Disp: 300 each, Rfl: 2     Medication List            Accurate as of October 14, 2020 11:59 PM. Always use your most recent med list.                Accu-Chek Aviva Plus w/Device Kit  Check blood glucose level twice daily. ICD 10 Code E11.9, Z79.4     apixaban 5 MG  Take 5 mg by mouth every 12 (twelve) hours.  Commonly known as: ELIQUIS  Medication Adjustments for Surgery: Stop 2 days before surgery     atorvastatin 40 MG tablet  Take 1 tablet by mouth every evening  Commonly known as: LIPITOR  Medication Adjustments for Surgery: Take as prescribed     CALCIUM-D PO  Take 1 tablet by mouth daily.  Medication Adjustments for Surgery: Hold day of surgery     furosemide 40 MG tablet  Take 1 tablet by mouth daily Patient takes two pills total 60 mg (Lasix 20 mg and Lasix 40 mg).  Commonly known as: LASIX  Medication Adjustments for Surgery: Hold day of surgery     glucose blood test strip  Check blood glucose level twice daily. ICD 10 Code E11.9, Z79.4  Commonly known as: Accu-Chek Aviva     Insulin Syringe-Needle U-100 31G X 5/16" 1 ML Misc  ADMINSTER INSULIN UNDER THE SKIN 2 TIMES A DAY  Commonly known as: BD INSULIN SYRINGE ULTRAFINE     lisinopril 40 MG tablet  TAKE ONE TABLET BY MOUTH DAILY  Commonly known as:  ZESTRIL  Medication Adjustments for Surgery: Hold day of surgery     metFORMIN 1000 MG tablet  Take 1 tablet (1,000 mg total) by mouth 2 (two) times daily with meals  Commonly known as: GLUCOPHAGE  Medication Adjustments for Surgery: Hold day of surgery     metoprolol succinate XL 25 MG 24 hr tablet  Take 25 mg by mouth daily  Commonly known as: TOPROL-XL  Medication Adjustments for Surgery: Take as prescribed     NovoLIN N 100 UNIT/ML injection  INJECT 52 UNITS SUBCUTANEOUSLY WITH BREAKFST AND 24 UNITS WITH DINNER  Generic drug: insulin NPH  Medication Adjustments for Surgery: Take as prescribed  Notes to patient: Per anesthesia guidelines. Inject 1/3 of the dose the day of surgery if glucose > 125     spironolactone 50 MG tablet  every 24 hours Alternate 25mg  with 50mg  every other day  Commonly known as: ALDACTONE  Medication Adjustments for Surgery: Take as prescribed     timolol 0.5 %  ophthalmic solution  1 drop 2 (two) times daily  Commonly known as: TIMOPTIC  Medication Adjustments for Surgery: Take as prescribed            Allergies   Allergen Reactions    Shellfish-Derived Products      Social History     Occupational History    Occupation: Maintenance     Employer: Civil engineer, contracting   Tobacco Use    Smoking status: Never    Smokeless tobacco: Never   Vaping Use    Vaping Use: Never used   Substance and Sexual Activity    Alcohol use: No    Drug use: No    Sexual activity: Not Currently       Menstrual History:   LMP / Status  Postmenopausal     No LMP recorded. Patient is postmenopausal.    Tubal Ligation?  No valid surgical or medical questions entered.             Exam Scores:   SDB score  Risk Category: No Risk    PONV score  Nausea Risk: SEVERE RISK    MST score  MST Score: 0    Allergy score  Risk Category: Low Risk    Frailty score  CFS Score: 3    MICA       RCRI score  RCRI Score: 4    DASI  DASI Score: 18.95  METs Level: 5.07       Visit Vitals  BP 157/89   Pulse 83   Ht 1.676 m (5\' 6" )   Wt 80.7 kg (178 lb)    SpO2 99%   BMI 28.73 kg/m       Review of Systems   Constitutional: Negative.    HENT: Negative.     Eyes: Negative.    Respiratory: Negative.     Cardiovascular: Negative.    Gastrointestinal: Negative.    Genitourinary: Negative.    Musculoskeletal: Negative.    Skin: Negative.    Neurological: Negative.    Endo/Heme/Allergies: Negative.    Psychiatric/Behavioral: Negative.       Physical Exam:  Mallampati: I  TM distance: > 3 FB (> 6 cm)  Mouth opening: > 3 FB (> 6 cm)  Neck extension: full    Denture status: lower dentures and upper dentures    Mental status: alert and oriented x3  Speech: normal    Normal cardiovascular exam  Heart rhythm: regular  no murmur:    (-) a murmur    Normal pulmonary exam  Breath sounds clear to auscultation bilaterally    Normal thoracolumbar exam  Normal extremity exam    Abdomen exam: soft, non-tender without distention.        Anesthesia Plan:  ASA 3   Anesthetic Options Discussed: General          A discussion with regarding next steps to prepare for the procedure and the planned anesthesia care took place during today's visit.  I explained that the patient will meet with their anesthesiology providers on the DOS.  Discussed with Patient      Acceptability of blood products: Accepted  Use of blood products discussed with Patient

## 2020-10-15 ENCOUNTER — Encounter (INDEPENDENT_AMBULATORY_CARE_PROVIDER_SITE_OTHER): Payer: Self-pay | Admitting: Internal Medicine

## 2020-10-15 ENCOUNTER — Ambulatory Visit (INDEPENDENT_AMBULATORY_CARE_PROVIDER_SITE_OTHER): Payer: Medicare PPO | Admitting: Internal Medicine

## 2020-10-15 VITALS — BP 142/67 | HR 74 | Temp 97.6°F | Wt 180.0 lb

## 2020-10-15 DIAGNOSIS — E1169 Type 2 diabetes mellitus with other specified complication: Secondary | ICD-10-CM

## 2020-10-15 DIAGNOSIS — E669 Obesity, unspecified: Secondary | ICD-10-CM

## 2020-10-15 DIAGNOSIS — Z01810 Encounter for preprocedural cardiovascular examination: Secondary | ICD-10-CM

## 2020-10-15 DIAGNOSIS — I83811 Varicose veins of right lower extremities with pain: Secondary | ICD-10-CM

## 2020-10-15 DIAGNOSIS — E7800 Pure hypercholesterolemia, unspecified: Secondary | ICD-10-CM

## 2020-10-15 DIAGNOSIS — I480 Paroxysmal atrial fibrillation: Secondary | ICD-10-CM

## 2020-10-15 MED ORDER — APIXABAN 5 MG PO TABS
5.0000 mg | ORAL_TABLET | Freq: Two times a day (BID) | ORAL | 0 refills | Status: DC
Start: 2020-10-15 — End: 2021-07-19

## 2020-10-15 MED ORDER — INSULIN SYRINGE-NEEDLE U-100 31G X 5/16" 1 ML MISC
2 refills | Status: DC
Start: 2020-10-15 — End: 2021-05-03

## 2020-10-15 MED ORDER — NOVOLIN N 100 UNIT/ML SC SUSP
SUBCUTANEOUS | 3 refills | Status: DC
Start: 2020-10-15 — End: 2021-04-22

## 2020-10-15 NOTE — Addendum Note (Signed)
Addended by: Girtha Hake on: 10/15/2020 05:12 PM     Modules accepted: Orders

## 2020-10-15 NOTE — Progress Notes (Signed)
Subjective:      Patient ID: [name] is a 65 y.o. female.    Chief Complaint:  Chief Complaint   Patient presents with    Establish Care    Lab order     A1C, CBC,BMP,BNP       HPI:  HPI    Patient is a 65 year old Hispanic female with history of diabetes mellitus 2 now followed by endocrinologist Dr. Benjiman Core, hypertension, hyperlipidemia, afib followed by cardiologist Dr. Fransisco Beau, among others, who is here today for preop    Visit Type: Pre-operative Evaluation  Procedure:Uterovaginal prolapse repair  Date of Surgery: 10/27/20  Surgeon:  Dr Othella Boyer  Fax Number (Required): N/A  Chief Complaint: noted to have vaginal bleeding  Recent Health (admits): no current complaints  Recent Health (denies): fever, fatigue, chest pain, cough, nausea, vomiting, diarrhea, dyspnea, dysuria, urinary frequency, abdominal pain, easy bruising, LE swelling and poor exercise tolerance  Exercise Tolerance: The functional capacity in METS that the patient can obtain is equivalent to 5-brisk walking.  Surgical Risk Factors: none  Prior Anesthesia: Patient reports no adverse reaction to anesthesia in the past.      T2DM  NPH 52 am and 24 pm  Metfromin 1000mg    Needs refill    Cardiologist: Carmina Miller    She is complaining of right leg pain and cramping            Problem List:  Patient Active Problem List   Diagnosis    Glaucoma    Type 2 diabetes mellitus without complication, with long-term current use of insulin    Hypertension    Hyperlipidemia    Fatty liver    Non-rheumatic mitral regurgitation    Venous insufficiency    Uterovaginal prolapse, complete    CAD (coronary artery disease)    Atrial fibrillation    Diastolic heart failure    Nodule of right lung    Pulmonary hypertension    History of COVID-19    Pre-op evaluation       Current Medications:  Current Outpatient Medications   Medication Sig Dispense Refill    atorvastatin (LIPITOR) 40 MG tablet Take 1 tablet by mouth every evening      Blood Glucose Monitoring Suppl  (ACCU-CHEK AVIVA PLUS) w/Device Kit Check blood glucose level twice daily. ICD 10 Code E11.9, Z79.4 1 kit 1    Calcium Carbonate-Vitamin D (CALCIUM-D PO) Take 1 tablet by mouth daily.      furosemide (LASIX) 40 MG tablet Take 1 tablet by mouth daily Patient takes two pills total 60 mg (Lasix 20 mg and Lasix 40 mg).      glucose blood (Accu-Chek Aviva) test strip Check blood glucose level twice daily. ICD 10 Code E11.9, Z79.4 200 each 2    lisinopril (ZESTRIL) 40 MG tablet TAKE ONE TABLET BY MOUTH DAILY 90 tablet 1    metFORMIN (GLUCOPHAGE) 1000 MG tablet Take 1 tablet (1,000 mg total) by mouth 2 (two) times daily with meals 180 tablet 1    metoprolol succinate XL (TOPROL-XL) 25 MG 24 hr tablet Take 25 mg by mouth daily      spironolactone (ALDACTONE) 50 MG tablet every 24 hours Alternate 25mg  with 50mg  every other day      timolol (TIMOPTIC) 0.5 % ophthalmic solution 1 drop 2 (two) times daily      apixaban (ELIQUIS) 5 MG Take 1 tablet (5 mg total) by mouth every 12 (twelve) hours 180 tablet 0    insulin NPH (  NovoLIN N) 100 UNIT/ML injection INJECT 50 UNITS SUBCUTANEOUSLY WITH BREAKFST AND 20 UNITS WITH DINNER 60 mL 3    Insulin Syringe-Needle U-100 (BD INSULIN SYRINGE ULTRAFINE) 31G X 5/16" 1 ML Misc ADMINSTER INSULIN UNDER THE SKIN 2 TIMES A DAY 300 each 2     No current facility-administered medications for this visit.       Allergies:  Allergies   Allergen Reactions    Shellfish-Derived Products        Past Medical History:  Past Medical History:   Diagnosis Date    Congenital anomaly of heart     Diabetes mellitus type 2 in obese     Diabetic retinopathy 06/28/2013    Of the right eye, per patient    Glaucoma 06/01/2012    Hyperlipidemia     Hypertension     Obesity     Venous insufficiency 09/17/2014    Followed at [facility].  Wears compression stockings.        Past Surgical History:  Past Surgical History:   Procedure Laterality Date    BREAST CYST EXCISION  1993    bilateral    TUBAL  LIGATION         Family History:  Family History   Problem Relation Age of Onset    Heart disease Mother     Diabetes Mother     Hypertension Mother     Hyperlipidemia Mother     Heart attack Mother     Heart disease Father     Diabetes Father     Hypertension Father     Hyperlipidemia Father     Cirrhosis Father     Diabetes Sister     Liver cancer Maternal Aunt     Cancer Maternal Uncle         liver       Social History:  Social History     Socioeconomic History    Marital status: Single    Number of children: 3   Occupational History    Occupation: Maintenance     Employer: Civil engineer, contracting   Tobacco Use    Smoking status: Never    Smokeless tobacco: Never   Vaping Use    Vaping Use: Never used   Substance and Sexual Activity    Alcohol use: No    Drug use: No    Sexual activity: Not Currently   Social History Narrative    Originally from [place].  Lives with her daughter.    Exercise: walks frequently     Social Determinants of Health     Financial Resource Strain: Medium Risk    Difficulty of Paying Living Expenses: Somewhat hard   Food Insecurity: Unknown    Worried About Programme researcher, broadcasting/film/video in the Last Year: Patient refused    Barista in the Last Year: Patient refused   Transportation Needs: No Transportation Needs    Lack of Transportation (Medical): No    Lack of Transportation (Non-Medical): No   Physical Activity: Sufficiently Active    Days of Exercise per Week: 2 days    Minutes of Exercise per Session: 110 min   Stress: No Stress Concern Present    Feeling of Stress : Only a little   Social Connections: Socially Isolated    Frequency of Communication with Friends and Family: More than three times a week    Frequency of Social Gatherings with Friends and Family: Patient refused    Attends Religious  Services: Never    Database administrator or Organizations: No    Attends Engineer, structural: Never    Marital Status: Never married   Catering manager Violence: Not At Risk    Fear of Current or  Ex-Partner: No    Emotionally Abused: No    Physically Abused: No    Sexually Abused: No   Housing Stability: Low Risk     Unable to Pay for Housing in the Last Year: No    Number of Places Lived in the Last Year: 1    Unstable Housing in the Last Year: No        The following sections were reviewed this encounter by the provider:   Tobacco  Allergies  Meds  Problems  Med Hx  Surg Hx  Fam Hx         ROS:  Review of Systems   Constitutional:  Negative for fatigue.   Respiratory:  Negative for chest tightness and shortness of breath.    Cardiovascular:  Negative for chest pain.   Gastrointestinal:  Negative for abdominal pain.   Genitourinary:  Positive for vaginal bleeding. Negative for dysuria.   Neurological:  Negative for light-headedness and headaches.     Vitals:  BP 142/67   Pulse 74   Temp 97.6 F (36.4 C)   Wt 81.6 kg (180 lb)   SpO2 100%   BMI 29.05 kg/m      Objective:     Physical Exam:  Physical Exam  Constitutional:       General: She is not in acute distress.     Appearance: She is not ill-appearing.   HENT:      Head: Normocephalic and atraumatic.   Cardiovascular:      Rate and Rhythm: Normal rate.      Heart sounds: No murmur heard.  Pulmonary:      Effort: Pulmonary effort is normal. No respiratory distress.      Breath sounds: Normal breath sounds. No wheezing or rales.   Abdominal:      General: Abdomen is flat.      Palpations: Abdomen is soft.      Tenderness: There is no abdominal tenderness.   Skin:     General: Skin is warm and dry.   Neurological:      General: No focal deficit present.      Mental Status: She is alert and oriented to person, place, and time.   Psychiatric:         Mood and Affect: Mood normal.         Behavior: Behavior normal.        Assessment:     1. Diabetes mellitus type 2 in obese  - insulin NPH (NovoLIN N) 100 UNIT/ML injection; INJECT 50 UNITS SUBCUTANEOUSLY WITH BREAKFST AND 20 UNITS WITH DINNER  Dispense: 60 mL; Refill: 3  - Insulin Syringe-Needle  U-100 (BD INSULIN SYRINGE ULTRAFINE) 31G X 5/16" 1 ML Misc; ADMINSTER INSULIN UNDER THE SKIN 2 TIMES A DAY  Dispense: 300 each; Refill: 2  - Hemoglobin A1C; Future  - Endocrinology Referral: Marcellina Millin, MD [facility])    2. Preop cardiovascular exam  - Hemoglobin A1C; Future  - CBC and differential; Future  - Basic Metabolic Panel; Future  - Lipid panel; Future  - B-type Natriuretic Peptide; Future    3. Paroxysmal atrial fibrillation  - apixaban (ELIQUIS) 5 MG; Take 1 tablet (5 mg total) by mouth every 12 (twelve) hours  Dispense: 180 tablet; Refill: 0    4. Pure hypercholesterolemia  - Lipid panel; Future    5. Varicose veins of right lower extremity with pain  - Vascular and Vein Referral: Lawerance Sabal, MD [facility])      Plan:     65 y.o. F with planned surgery as above.    Known risk factors for perioperative complications: None    Difficulty with intubation is not anticipated.     Blood work shows AKI, mild hyperkalemia and mild hypernatremia  As well as uncontrolled DM with Hgb A1c 9.0  At this time, pt is not cleared to undergo the planned surgery due to uncontrolled DM and AKI    Pt was notified of the lab results and my recommendations  We will increase morning Insulin to 54 U (previously was on 52U); advised to monitor BS 4x a day and to send a log in 3 days for reviewed    Advised to increase her hydration to at least 60oz of water a day; she will repeat BMP on 9/19.    BNP lab work was cancelled; she will repeat in 9/19.     We will follow up on results    Melvenia Beam, MD

## 2020-10-18 ENCOUNTER — Encounter (INDEPENDENT_AMBULATORY_CARE_PROVIDER_SITE_OTHER): Payer: Self-pay | Admitting: Internal Medicine

## 2020-10-19 ENCOUNTER — Other Ambulatory Visit: Payer: Medicare PPO

## 2020-10-20 ENCOUNTER — Other Ambulatory Visit (INDEPENDENT_AMBULATORY_CARE_PROVIDER_SITE_OTHER): Payer: Self-pay | Admitting: Internal Medicine

## 2020-10-20 ENCOUNTER — Encounter (INDEPENDENT_AMBULATORY_CARE_PROVIDER_SITE_OTHER): Payer: Self-pay | Admitting: Internal Medicine

## 2020-10-20 DIAGNOSIS — N179 Acute kidney failure, unspecified: Secondary | ICD-10-CM

## 2020-10-20 DIAGNOSIS — Z01810 Encounter for preprocedural cardiovascular examination: Secondary | ICD-10-CM

## 2020-10-20 LAB — CBC AND DIFFERENTIAL
Baso(Absolute): 0 10*3/uL (ref 0.0–0.2)
Basophils Automated: 0 %
Eosinophils Absolute: 0.3 10*3/uL (ref 0.0–0.4)
Eosinophils Automated: 5 %
Hematocrit: 37 % (ref 34.0–46.6)
Hemoglobin: 12.5 g/dL (ref 11.1–15.9)
Immature Granulocytes Absolute: 0 10*3/uL (ref 0.0–0.1)
Immature Granulocytes: 0 %
Lymphocytes Absolute: 2.1 10*3/uL (ref 0.7–3.1)
Lymphocytes Automated: 28 %
MCH: 30.9 pg (ref 26.6–33.0)
MCHC: 33.8 g/dL (ref 31.5–35.7)
MCV: 91 fL (ref 79–97)
Monocytes Absolute: 0.7 10*3/uL (ref 0.1–0.9)
Monocytes: 10 %
Neutrophils Absolute Count: 4.2 10*3/uL (ref 1.4–7.0)
Neutrophils: 57 %
Platelets: 263 10*3/uL (ref 150–450)
RBC: 4.05 x10E6/uL (ref 3.77–5.28)
RDW: 13.1 % (ref 11.7–15.4)
WBC: 7.3 10*3/uL (ref 3.4–10.8)

## 2020-10-20 LAB — BASIC METABOLIC PANEL
BUN / Creatinine Ratio: 26 (ref 12–28)
BUN: 30 mg/dL — ABNORMAL HIGH (ref 8–27)
CO2: 20 mmol/L (ref 20–29)
Calcium: 9.5 mg/dL (ref 8.7–10.3)
Chloride: 104 mmol/L (ref 96–106)
Creatinine: 1.15 mg/dL — ABNORMAL HIGH (ref 0.57–1.00)
Glucose: 77 mg/dL (ref 65–99)
Potassium: 5.3 mmol/L — ABNORMAL HIGH (ref 3.5–5.2)
Sodium: 145 mmol/L — ABNORMAL HIGH (ref 134–144)
eGFR: 53 mL/min/{1.73_m2} — ABNORMAL LOW (ref >59)

## 2020-10-20 LAB — HEMOGLOBIN A1C: Hemoglobin A1C: 9 % — ABNORMAL HIGH (ref 4.8–5.6)

## 2020-10-20 LAB — LIPID PANEL
Cholesterol / HDL Ratio: 3.6 ratio (ref 0.0–4.4)
Cholesterol: 165 mg/dL (ref 100–199)
HDL: 46 mg/dL (ref >39)
LDL Chol Calculated (NIH): 89 mg/dL (ref 0–99)
Triglycerides: 178 mg/dL — ABNORMAL HIGH (ref 0–149)
VLDL Calculated: 30 mg/dL (ref 5–40)

## 2020-10-20 LAB — B-TYPE NATRIURETIC PEPTIDE

## 2020-10-21 LAB — B-TYPE NATRIURETIC PEPTIDE: B-Natriuretic Peptide: 248.6 pg/mL — ABNORMAL HIGH (ref 0.0–100.0)

## 2020-10-23 ENCOUNTER — Encounter (INDEPENDENT_AMBULATORY_CARE_PROVIDER_SITE_OTHER): Payer: Self-pay | Admitting: Female Pelvic Medicine and Reconstructive Surgery

## 2020-10-23 ENCOUNTER — Encounter (INDEPENDENT_AMBULATORY_CARE_PROVIDER_SITE_OTHER): Payer: Medicare PPO | Admitting: Female Pelvic Medicine and Reconstructive Surgery

## 2020-10-23 NOTE — Progress Notes (Incomplete)
[name] is a 65 y.o. female presenting for UDS. She is scheduled for a lefort colpocleisis on 10/27/2020 for management of her POP. Patient did not get medically cleared due to her uncontrolled DM and AKI, surgery will be postponed until medically cleared.       Urodynamics Evaluation    PELVIC ORGAN PROLAPSE QUANTIFICATION (Valsalva)  +3        Aa  (anterior wall 3 cm from hymen)                          (-3 to +3)      +3        Ba  (most dependent part of rest of anterior wall       (-3 to TVL)   +3        C   (cervix or vaginal cuff)                                           (TVL)         +3        D   (posterior fornix - if no hysterectomy)                  (TVL)         +2        Ap (Posterior wall 3 cm from hymen)                         (-3 to +3)      +2        Bp (most dependent part of rest of posterior wall)     (-3 to TVL)  3         GH (genital hiatus-midurethra to PB)                         (no limit)  4        PB (Perineal body - PB to mid anus)                         (no limit)       8         TVL (total vaginal length non straining)                      (no limit)       yes      Perinium Protrussion/Descent           Chief Complaint:   Prolapse    {yes/no:9010} Complex uroflowmetry (e.g., calibrated electronic equipment)  Urinary flow rate  After obtaining appropriate consents, the patient was taken to the procedure room by the procedure nurse under physician supervision.  After initial assessment of flow rate with complex uroflowmetry, cystometrogram procedure was started.  Uroflowmetry is performed using a flow meter to measure the quantity of fluid voided per unit of time - expressed in millilitres per second (ml/s). Patients are instructed to void normally with a comfortably full bladder.  A flow rate based on a voided volume of under 150 ml is insufficient for reliable interpretation.     Voided Volume:*** ml  Maximum Flow Rate: *** ml/sec  Average Flow Rate: ***  ml/sec  Pattern: ***    Post-void residual measurement  Post-void residual (PVR) volume is an important part of the assessment of patients with symptoms of voiding dysfunction or recurrent urinary tract infections, which may be due to incomplete bladder emptying.  Both bladder outlet obstruction and detrusor underactivity contribute to the development of PVR.  PVR volume should be measured in patients with urinary incontinence who have voiding symptoms. It should also be measured when assessing patients with complicated urinary incontinence. PVR volume should be monitored in patients receiving treatments that may cause or worsen voiding dysfunction.    PVR: *** ml     {yes/no:9010} Complex cystometrogram (ie, calibrated electronic equipment); with voiding pressure studies (i.e., bladder voiding pressure), any technique  Cystometry: Using calibrated electronic equipment, cystometry was done with a medium fill rate of 50 cc per minute and simultaneous measurement of intraabdominal pressure using a rectal catheter and bladder pressure using a urethral catheter.   The first sensation and strong desire were documented.     First Sensation: *** ml   First Desire: *** ml  Strong Desire: *** ml  Capacity: *** ml    Leak point pressure  While the bladder is being filled during cystometry, it may suddenly contract and squeeze some water out without warning. The manometer records the pressure at the point when the leakage occurred.  The patient may also be asked to apply abdominal pressure to the bladder by coughing, shifting position, or trying a Valsalva manoeuvre. These actions help to evaluate the sphincter muscles.  A bladder and a rectal / or vaginal catheter were place. Simultaneous measurement of bladder pressure and flow rate allows the best assessment of the presence or absence of bladder outlet obstruction.  Cystometry provides information regarding the function of the lower urinary tract during both the storage and  voiding phases of the bladder cycle and often supports a definitive diagnosis for LUTS.   Cystometry can help inform decisions about future management, including possible surgery for bladder outlet obstruction or detrusor overactivity, and neurological lower urinary tract dysfunction.  Multichannel cystometry may also help to characterize bladder compliance, sensation and capacity.    COUGHING     VOLUME: ***  LPP: ***  Pves Pressure: *** cmH2O    VALSALVA    VOLUME: ***  LPP: ***  Pves Pressure: *** cmH2O    Detrusor: ***    {yes/no:9010}  Voiding pressure studies (VP); intra-abdominal voiding pressure (AP) (rectal, gastric, intraperitoneal)   A voiding pressure study was completed and performed utilizing both bladder and rectal catheters for detrusor, vesical, and abdominal pressure measurement.  The pressure flow, according to the differential between the abdominal and bladder pressure was obtained.  Pressure flow study  After cystometry, the patient is asked to empty the bladder. The catheter can measure the bladder pressures required to urinate and the flow rate that a given pressure generates.  Bladder outlet obstruction is less common in women but may occur as a result of genitourinary prolapse or after a surgical procedure for urinary incontinence.     Voided Volume: *** ml  Maximum Flow Rate: *** ml/sec  Maximum Detrusor: *** cmH2O  Average Flow Rate: *** ml/sec  PVR: *** ml    {yes/no:9010}  Electromyography studies (EMG) of anal or urethral sphincter, other than needle, any technique   Electromyography: Provocative maneuvers produced appropriate changes in waveforms. The EMG shows increased EMG activity with increased intra-abdominal pressure. There was a decrease in the EMG activity during voiding consistent with  normal function of the pelvic floor.    THE COMPLETE PATIENT REPORT IS SCANNED IN.    Adequate volume on uroflow. Normal FS, FD, SD, MC. Normal compliance. ***+CST @***ml with calculated LPP  ***mmHg, Pves ***cmH2O. Normal PFS with unobstructed flow, normal PVR. Normal EMG coordination.     Harriet Butte MD  Fellow

## 2020-10-27 ENCOUNTER — Encounter (HOSPITAL_BASED_OUTPATIENT_CLINIC_OR_DEPARTMENT_OTHER): Admission: RE | Payer: Self-pay

## 2020-10-27 ENCOUNTER — Ambulatory Visit (HOSPITAL_BASED_OUTPATIENT_CLINIC_OR_DEPARTMENT_OTHER): Admission: RE | Admit: 2020-10-27 | Payer: Medicare PPO | Admitting: Female Pelvic Medicine and Reconstructive Surgery

## 2020-10-27 DIAGNOSIS — N813 Complete uterovaginal prolapse: Secondary | ICD-10-CM

## 2020-10-27 SURGERY — VAGINECTOMY, LEFORT PROCEDURE, COLPECTOMY
Anesthesia: Anesthesia General | Site: Pelvis

## 2020-11-18 ENCOUNTER — Encounter (INDEPENDENT_AMBULATORY_CARE_PROVIDER_SITE_OTHER): Payer: Self-pay | Admitting: Internal Medicine

## 2021-01-01 ENCOUNTER — Encounter (INDEPENDENT_AMBULATORY_CARE_PROVIDER_SITE_OTHER): Payer: Self-pay | Admitting: Internal Medicine

## 2021-01-07 ENCOUNTER — Encounter (INDEPENDENT_AMBULATORY_CARE_PROVIDER_SITE_OTHER): Payer: Self-pay | Admitting: Internal Medicine

## 2021-01-20 ENCOUNTER — Encounter (INDEPENDENT_AMBULATORY_CARE_PROVIDER_SITE_OTHER): Payer: Self-pay | Admitting: Internal Medicine

## 2021-01-22 ENCOUNTER — Ambulatory Visit (INDEPENDENT_AMBULATORY_CARE_PROVIDER_SITE_OTHER): Payer: Medicare PPO | Admitting: Internal Medicine

## 2021-01-22 ENCOUNTER — Encounter (INDEPENDENT_AMBULATORY_CARE_PROVIDER_SITE_OTHER): Payer: Self-pay | Admitting: Internal Medicine

## 2021-01-22 VITALS — BP 182/77 | HR 73 | Temp 97.6°F | Wt 176.0 lb

## 2021-01-22 DIAGNOSIS — I10 Essential (primary) hypertension: Secondary | ICD-10-CM

## 2021-01-22 DIAGNOSIS — E1169 Type 2 diabetes mellitus with other specified complication: Secondary | ICD-10-CM

## 2021-01-22 DIAGNOSIS — E669 Obesity, unspecified: Secondary | ICD-10-CM

## 2021-01-22 DIAGNOSIS — Z23 Encounter for immunization: Secondary | ICD-10-CM

## 2021-01-22 MED ORDER — AMLODIPINE BESYLATE 5 MG PO TABS
5.0000 mg | ORAL_TABLET | Freq: Every day | ORAL | 0 refills | Status: DC
Start: 2021-01-22 — End: 2021-01-29

## 2021-01-22 NOTE — Progress Notes (Incomplete)
Subjective:      Patient ID: [name] is a 65 y.o. female.    Chief Complaint:  Chief Complaint   Patient presents with   . Hypertension       HPI:  HPI    Patient is a 65 year old Hispanic female with history of diabetes mellitus 2 now followed by endocrinologist Dr. Benjiman Core, hypertension, hyperlipidemia, afib followed by cardiologist Dr. Fransisco Beau, among others, who is here for follow up    DM, uncontrolled  Metformin 1000 mg BID  On NPH 50U AM and 20U PM  She says she sometime does not take her morning insulin due to low BS readings in the 80's; she worries she might pass out while out at work    She has been seeing her cardiologist on 12/01 and 12/14 due to abnormal kidney result  She was also noted to have elevated BP readings  On the last visit 2 days ago, she was told to hold her Spironolactone and Lisinopril for one week and restart Lisinopril only next week, HER BP was 140/80 in the cardiologist office  Today in the office, her BP is elevtaed to 182/87         HTN  Furosemide 60 mg  Bumetenide 1 mg daily  She was told to stop lisinopril and spirinolactone; to restart Lisinopril in one week  Has done BMP on 12/13  BP was 140/80 on 12/14 at cardiology office    PAF  On Eliquis 5 mg BID  Saw Dr Fransisco Beau on 01/20/21    Uterine Prolapse    Problem List:  Patient Active Problem List   Diagnosis   . Glaucoma   . Type 2 diabetes mellitus without complication, with long-term current use of insulin   . Hypertension   . Hyperlipidemia   . Fatty liver   . Non-rheumatic mitral regurgitation   . Venous insufficiency   . Uterovaginal prolapse, complete   . CAD (coronary artery disease)   . Atrial fibrillation   . Diastolic heart failure   . Nodule of right lung   . Pulmonary hypertension   . History of COVID-19   . Pre-op evaluation       Current Medications:  Current Outpatient Medications   Medication Sig Dispense Refill   . apixaban (ELIQUIS) 5 MG Take 1 tablet (5 mg total) by mouth every 12 (twelve) hours 180 tablet 0   .  atorvastatin (LIPITOR) 40 MG tablet Take 1 tablet by mouth every evening     . Blood Glucose Monitoring Suppl (ACCU-CHEK AVIVA PLUS) w/Device Kit Check blood glucose level twice daily. ICD 10 Code E11.9, Z79.4 1 kit 1   . Calcium Carbonate-Vitamin D (CALCIUM-D PO) Take 1 tablet by mouth daily.     . furosemide (LASIX) 40 MG tablet Take 1 tablet by mouth daily Patient takes two pills total 60 mg (Lasix 20 mg and Lasix 40 mg).     Marland Kitchen glucose blood (Accu-Chek Aviva) test strip Check blood glucose level twice daily. ICD 10 Code E11.9, Z79.4 200 each 2   . insulin NPH (NovoLIN N) 100 UNIT/ML injection INJECT 50 UNITS SUBCUTANEOUSLY WITH BREAKFST AND 20 UNITS WITH DINNER 60 mL 3   . Insulin Syringe-Needle U-100 (BD INSULIN SYRINGE ULTRAFINE) 31G X 5/16" 1 ML Misc ADMINSTER INSULIN UNDER THE SKIN 2 TIMES A DAY 300 each 2   . lisinopril (ZESTRIL) 40 MG tablet TAKE ONE TABLET BY MOUTH DAILY 90 tablet 1   . metFORMIN (GLUCOPHAGE) 1000 MG  tablet Take 1 tablet (1,000 mg total) by mouth 2 (two) times daily with meals 180 tablet 1   . metoprolol succinate XL (TOPROL-XL) 25 MG 24 hr tablet Take 25 mg by mouth daily     . spironolactone (ALDACTONE) 50 MG tablet every 24 hours Alternate 25mg  with 50mg  every other day     . timolol (TIMOPTIC) 0.5 % ophthalmic solution 1 drop 2 (two) times daily       No current facility-administered medications for this visit.       Allergies:  Allergies   Allergen Reactions   . Shellfish-Derived Products        Past Medical History:  Past Medical History:   Diagnosis Date   . Congenital anomaly of heart    . Diabetes mellitus type 2 in obese    . Diabetic retinopathy 06/28/2013    Of the right eye, per patient   . Glaucoma 06/01/2012   . Hyperlipidemia    . Hypertension    . Obesity    . Venous insufficiency 09/17/2014    Followed at [facility].  Wears compression stockings.        Past Surgical History:  Past Surgical History:   Procedure Laterality Date   . BREAST CYST EXCISION   1993    bilateral   . TUBAL LIGATION         Family History:  Family History   Problem Relation Age of Onset   . Heart disease Mother    . Diabetes Mother    . Hypertension Mother    . Hyperlipidemia Mother    . Heart attack Mother    . Heart disease Father    . Diabetes Father    . Hypertension Father    . Hyperlipidemia Father    . Cirrhosis Father    . Diabetes Sister    . Liver cancer Maternal Aunt    . Cancer Maternal Uncle         liver       Social History:  Social History     Socioeconomic History   . Marital status: Single   . Number of children: 3   Occupational History   . Occupation: Maintenance     Employer: [employer]   Tobacco Use   . Smoking status: Never   . Smokeless tobacco: Never   Vaping Use   . Vaping Use: Never used   Substance and Sexual Activity   . Alcohol use: No   . Drug use: No   . Sexual activity: Not Currently   Social History Narrative    Originally from [place].  Lives with her daughter.    Exercise: walks frequently     Social Determinants of Health     Financial Resource Strain: Medium Risk   . Difficulty of Paying Living Expenses: Somewhat hard   Food Insecurity: Unknown   . Worried About Programme researcher, broadcasting/film/video in the Last Year: Patient refused   . Ran Out of Food in the Last Year: Patient refused   Transportation Needs: No Transportation Needs   . Lack of Transportation (Medical): No   . Lack of Transportation (Non-Medical): No   Physical Activity: Sufficiently Active   . Days of Exercise per Week: 2 days   . Minutes of Exercise per Session: 110 min   Stress: No Stress Concern Present   . Feeling of Stress : Only a little   Social Connections: Socially Isolated   . Frequency of Communication  with Friends and Family: More than three times a week   . Frequency of Social Gatherings with Friends and Family: Patient refused   . Attends Religious Services: Never   . Active Member of Clubs or Organizations: No   . Attends Banker Meetings: Never   . Marital Status: Never married    Intimate Partner Violence: Not At Risk   . Fear of Current or Ex-Partner: No   . Emotionally Abused: No   . Physically Abused: No   . Sexually Abused: No   Housing Stability: Low Risk    . Unable to Pay for Housing in the Last Year: No   . Number of Places Lived in the Last Year: 1   . Unstable Housing in the Last Year: No        The following sections were reviewed this encounter by the provider:        ROS:  Review of Systems    Vitals:  BP 182/77   Pulse 73   Temp 97.6 F (36.4 C)   Wt 79.8 kg (176 lb)   SpO2 98%   BMI 28.41 kg/m      Objective:     Physical Exam:  Physical Exam     Assessment:     There are no diagnoses linked to this encounter.    Plan:     ***    Melvenia Beam, MD

## 2021-01-22 NOTE — Patient Instructions (Signed)
Please decrease your night time insulin to 15U,   Keep 50 U in the morning after breakfast

## 2021-01-23 LAB — HEPATIC FUNCTION PANEL
ALT: 15 IU/L (ref 0–32)
AST (SGOT): 20 IU/L (ref 0–40)
Albumin: 4.5 g/dL (ref 3.8–4.8)
Alkaline Phosphatase: 106 IU/L (ref 44–121)
Bilirubin Direct: <0.1 mg/dL (ref 0.00–0.40)
Bilirubin, Total: 0.3 mg/dL (ref 0.0–1.2)
Protein, Total: 7.4 g/dL (ref 6.0–8.5)

## 2021-01-23 LAB — HEMOGLOBIN A1C: Hemoglobin A1C: 7.9 % — ABNORMAL HIGH (ref 4.8–5.6)

## 2021-01-24 LAB — MICROALBUMIN, RANDOM URINE
Creatinine, UR: 41 mg/dL
Microalb/Crt. Ratio: 51 mg/g creat — ABNORMAL HIGH (ref 0–29)
Microalbumin, UR: 20.9 ug/mL

## 2021-01-25 ENCOUNTER — Encounter (INDEPENDENT_AMBULATORY_CARE_PROVIDER_SITE_OTHER): Payer: Self-pay | Admitting: Internal Medicine

## 2021-01-26 ENCOUNTER — Other Ambulatory Visit (INDEPENDENT_AMBULATORY_CARE_PROVIDER_SITE_OTHER): Payer: Self-pay | Admitting: Internal Medicine

## 2021-01-26 DIAGNOSIS — I10 Essential (primary) hypertension: Secondary | ICD-10-CM

## 2021-03-09 ENCOUNTER — Encounter (INDEPENDENT_AMBULATORY_CARE_PROVIDER_SITE_OTHER): Payer: Self-pay | Admitting: Internal Medicine

## 2021-04-02 ENCOUNTER — Encounter (INDEPENDENT_AMBULATORY_CARE_PROVIDER_SITE_OTHER): Payer: Self-pay

## 2021-04-20 ENCOUNTER — Emergency Department: Payer: Medicare PPO

## 2021-04-20 ENCOUNTER — Observation Stay
Admission: EM | Admit: 2021-04-20 | Discharge: 2021-04-22 | DRG: 291 | Disposition: A | Discharge status: Home / Self Care | Payer: Medicare PPO | Attending: Internal Medicine | Admitting: Internal Medicine

## 2021-04-20 DIAGNOSIS — E785 Hyperlipidemia, unspecified: Secondary | ICD-10-CM | POA: Diagnosis present

## 2021-04-20 DIAGNOSIS — N179 Acute kidney failure, unspecified: Secondary | ICD-10-CM | POA: Diagnosis present

## 2021-04-20 DIAGNOSIS — I160 Hypertensive urgency: Secondary | ICD-10-CM | POA: Diagnosis present

## 2021-04-20 DIAGNOSIS — I480 Paroxysmal atrial fibrillation: Secondary | ICD-10-CM | POA: Diagnosis present

## 2021-04-20 DIAGNOSIS — Z794 Long term (current) use of insulin: Secondary | ICD-10-CM

## 2021-04-20 DIAGNOSIS — Z79899 Other long term (current) drug therapy: Secondary | ICD-10-CM

## 2021-04-20 DIAGNOSIS — Z833 Family history of diabetes mellitus: Secondary | ICD-10-CM

## 2021-04-20 DIAGNOSIS — Z7984 Long term (current) use of oral hypoglycemic drugs: Secondary | ICD-10-CM

## 2021-04-20 DIAGNOSIS — E669 Obesity, unspecified: Secondary | ICD-10-CM | POA: Diagnosis present

## 2021-04-20 DIAGNOSIS — Z808 Family history of malignant neoplasm of other organs or systems: Secondary | ICD-10-CM

## 2021-04-20 DIAGNOSIS — E11319 Type 2 diabetes mellitus with unspecified diabetic retinopathy without macular edema: Secondary | ICD-10-CM | POA: Diagnosis present

## 2021-04-20 DIAGNOSIS — H409 Unspecified glaucoma: Secondary | ICD-10-CM | POA: Diagnosis present

## 2021-04-20 DIAGNOSIS — R0602 Shortness of breath: Secondary | ICD-10-CM

## 2021-04-20 DIAGNOSIS — Z8249 Family history of ischemic heart disease and other diseases of the circulatory system: Secondary | ICD-10-CM

## 2021-04-20 DIAGNOSIS — I5033 Acute on chronic diastolic (congestive) heart failure: Secondary | ICD-10-CM | POA: Diagnosis present

## 2021-04-20 DIAGNOSIS — E1165 Type 2 diabetes mellitus with hyperglycemia: Secondary | ICD-10-CM | POA: Diagnosis present

## 2021-04-20 DIAGNOSIS — J810 Acute pulmonary edema: Secondary | ICD-10-CM | POA: Diagnosis present

## 2021-04-20 DIAGNOSIS — Z6830 Body mass index (BMI) 30.0-30.9, adult: Secondary | ICD-10-CM

## 2021-04-20 DIAGNOSIS — Z7901 Long term (current) use of anticoagulants: Secondary | ICD-10-CM

## 2021-04-20 DIAGNOSIS — Z20822 Contact with and (suspected) exposure to covid-19: Secondary | ICD-10-CM | POA: Diagnosis present

## 2021-04-20 DIAGNOSIS — I110 Hypertensive heart disease with heart failure: Principal | ICD-10-CM | POA: Diagnosis present

## 2021-04-20 DIAGNOSIS — I2510 Atherosclerotic heart disease of native coronary artery without angina pectoris: Secondary | ICD-10-CM | POA: Diagnosis present

## 2021-04-20 DIAGNOSIS — E1169 Type 2 diabetes mellitus with other specified complication: Secondary | ICD-10-CM

## 2021-04-20 LAB — COMPREHENSIVE METABOLIC PANEL
ALT: 25 U/L (ref 0–55)
AST (SGOT): 31 U/L (ref 5–41)
Albumin/Globulin Ratio: 1.1 (ref 0.9–2.2)
Albumin: 3.8 g/dL (ref 3.5–5.0)
Alkaline Phosphatase: 131 U/L — ABNORMAL HIGH (ref 37–117)
Anion Gap: 8 (ref 5.0–15.0)
BUN: 14 mg/dL (ref 7.0–21.0)
Bilirubin, Total: 0.6 mg/dL (ref 0.2–1.2)
CO2: 28 mEq/L (ref 17–29)
Calcium: 9.3 mg/dL (ref 8.5–10.5)
Chloride: 105 mEq/L (ref 99–111)
Creatinine: 1 mg/dL (ref 0.4–1.0)
Globulin: 3.5 g/dL (ref 2.0–3.6)
Glucose: 211 mg/dL — ABNORMAL HIGH (ref 70–100)
Potassium: 4.8 mEq/L (ref 3.5–5.3)
Protein, Total: 7.3 g/dL (ref 6.0–8.3)
Sodium: 141 mEq/L (ref 135–145)

## 2021-04-20 LAB — GLUCOSE WHOLE BLOOD - POCT
Whole Blood Glucose POCT: 87 mg/dL (ref 70–100)
Whole Blood Glucose POCT: 105 mg/dL — ABNORMAL HIGH (ref 70–100)
Whole Blood Glucose POCT: 176 mg/dL — ABNORMAL HIGH (ref 70–100)

## 2021-04-20 LAB — CBC AND DIFFERENTIAL
Absolute NRBC: 0 10*3/uL (ref 0.00–0.00)
Basophils Absolute Automated: 0.04 10*3/uL (ref 0.00–0.08)
Basophils Automated: 0.4 %
Eosinophils Absolute Automated: 0.31 10*3/uL (ref 0.00–0.44)
Eosinophils Automated: 3.4 %
Hematocrit: 38.3 % (ref 34.7–43.7)
Hgb: 12.8 g/dL (ref 11.4–14.8)
Immature Granulocytes Absolute: 0.04 10*3/uL (ref 0.00–0.07)
Immature Granulocytes: 0.4 %
Instrument Absolute Neutrophil Count: 6.25 10*3/uL (ref 1.10–6.33)
Lymphocytes Absolute Automated: 1.61 10*3/uL (ref 0.42–3.22)
Lymphocytes Automated: 17.9 %
MCH: 30.4 pg (ref 25.1–33.5)
MCHC: 33.4 g/dL (ref 31.5–35.8)
MCV: 91 fL (ref 78.0–96.0)
MPV: 11.1 fL (ref 8.9–12.5)
Monocytes Absolute Automated: 0.74 10*3/uL (ref 0.21–0.85)
Monocytes: 8.2 %
Neutrophils Absolute: 6.25 10*3/uL (ref 1.10–6.33)
Neutrophils: 69.7 %
Nucleated RBC: 0 /100 WBC (ref 0.0–0.0)
Platelets: 248 10*3/uL (ref 142–346)
RBC: 4.21 10*6/uL (ref 3.90–5.10)
RDW: 13 % (ref 11–15)
WBC: 8.99 10*3/uL (ref 3.10–9.50)

## 2021-04-20 LAB — PROBNP: NT-proBNP: 2375 pg/mL — ABNORMAL HIGH (ref 0–125)

## 2021-04-20 LAB — COVID-19 (SARS-COV-2)

## 2021-04-20 LAB — SARS-COV-2 (COVID-19) RNA, PCR (LIAT): SARS-CoV-2 Overall Result: NOT DETECTED

## 2021-04-20 LAB — HIGH SENSITIVITY TROPONIN-I
hs Troponin-I: 56.5 ng/L — AB
hs Troponin-I: 54.3 ng/L — AB
hs Troponin-I: 50.3 ng/L — AB

## 2021-04-20 LAB — TSH: TSH: 0.73 u[IU]/mL (ref 0.35–4.94)

## 2021-04-20 LAB — GFR: EGFR: 55.5

## 2021-04-20 MED ORDER — DEXTROSE 10 % IV BOLUS
12.5000 g | INTRAVENOUS | Status: DC | PRN
Start: 2021-04-20 — End: 2021-04-22

## 2021-04-20 MED ORDER — DEXTROSE 50 % IV SOLN
12.5000 g | INTRAVENOUS | Status: DC | PRN
Start: 2021-04-20 — End: 2021-04-22

## 2021-04-20 MED ORDER — SUMATRIPTAN SUCCINATE 50 MG PO TABS
50.0000 mg | ORAL_TABLET | Freq: Once | ORAL | Status: AC
Start: 2021-04-20 — End: 2021-04-20
  Administered 2021-04-20: 50 mg via ORAL
  Filled 2021-04-20: qty 1

## 2021-04-20 MED ORDER — ATORVASTATIN CALCIUM 40 MG PO TABS
40.0000 mg | ORAL_TABLET | Freq: Every evening | ORAL | Status: DC
Start: 2021-04-21 — End: 2021-04-22
  Administered 2021-04-21: 40 mg via ORAL
  Filled 2021-04-20: qty 1

## 2021-04-20 MED ORDER — INSULIN LISPRO 100 UNIT/ML SOLN (WRAP)
5.0000 [IU] | Freq: Once | Status: DC
Start: 2021-04-20 — End: 2021-04-20

## 2021-04-20 MED ORDER — EMPAGLIFLOZIN 10 MG PO TABS
10.0000 mg | ORAL_TABLET | Freq: Every day | ORAL | Status: DC
Start: 2021-04-20 — End: 2021-04-22
  Administered 2021-04-20 – 2021-04-22 (×3): 10 mg via ORAL
  Filled 2021-04-20 (×3): qty 1

## 2021-04-20 MED ORDER — GLUCOSE 40 % PO GEL (WRAP)
15.0000 g | ORAL | Status: DC | PRN
Start: 2021-04-20 — End: 2021-04-22

## 2021-04-20 MED ORDER — ISOSORBIDE MONONITRATE ER 30 MG PO TB24
30.0000 mg | ORAL_TABLET | Freq: Every day | ORAL | Status: DC
Start: 2021-04-20 — End: 2021-04-22
  Administered 2021-04-20 – 2021-04-22 (×3): 30 mg via ORAL
  Filled 2021-04-20 (×3): qty 1

## 2021-04-20 MED ORDER — TIMOLOL MALEATE 0.5 % OP SOLN
1.0000 [drp] | Freq: Every day | OPHTHALMIC | Status: DC
Start: 2021-04-20 — End: 2021-04-22
  Administered 2021-04-20 – 2021-04-22 (×3): 1 [drp] via OPHTHALMIC
  Filled 2021-04-20: qty 5

## 2021-04-20 MED ORDER — METOPROLOL SUCCINATE ER 50 MG PO TB24
50.0000 mg | ORAL_TABLET | Freq: Every day | ORAL | Status: DC
Start: 2021-04-20 — End: 2021-04-22
  Administered 2021-04-20 – 2021-04-22 (×3): 50 mg via ORAL
  Filled 2021-04-20 (×3): qty 1

## 2021-04-20 MED ORDER — INSULIN LISPRO 100 UNIT/ML SOLN (WRAP)
1.0000 [IU] | Freq: Every evening | Status: DC
Start: 2021-04-20 — End: 2021-04-22
  Administered 2021-04-21: 2 [IU] via SUBCUTANEOUS
  Filled 2021-04-20: qty 6

## 2021-04-20 MED ORDER — LISINOPRIL 10 MG PO TABS
40.0000 mg | ORAL_TABLET | Freq: Every day | ORAL | Status: DC
Start: 2021-04-21 — End: 2021-04-22
  Administered 2021-04-21 – 2021-04-22 (×2): 40 mg via ORAL
  Filled 2021-04-20 (×2): qty 4

## 2021-04-20 MED ORDER — LABETALOL HCL 5 MG/ML IV SOLN (WRAP)
10.0000 mg | Freq: Four times a day (QID) | INTRAVENOUS | Status: DC | PRN
Start: 2021-04-20 — End: 2021-04-21
  Administered 2021-04-20 – 2021-04-21 (×2): 10 mg via INTRAVENOUS
  Filled 2021-04-20 (×2): qty 4

## 2021-04-20 MED ORDER — NITROGLYCERIN 0.4 MG SL SUBL
0.4000 mg | SUBLINGUAL_TABLET | SUBLINGUAL | Status: DC | PRN
Start: 2021-04-20 — End: 2021-04-22
  Administered 2021-04-20 (×2): 0.4 mg via SUBLINGUAL
  Filled 2021-04-20: qty 25

## 2021-04-20 MED ORDER — INSULIN LISPRO 100 UNIT/ML SOLN (WRAP)
1.0000 [IU] | Freq: Three times a day (TID) | Status: DC
Start: 2021-04-20 — End: 2021-04-22
  Administered 2021-04-21: 1 [IU] via SUBCUTANEOUS
  Administered 2021-04-22: 13:00:00 2 [IU] via SUBCUTANEOUS
  Filled 2021-04-20: qty 6
  Filled 2021-04-20: qty 3

## 2021-04-20 MED ORDER — INSULIN GLARGINE 100 UNIT/ML SC SOLN
25.0000 [IU] | Freq: Every evening | SUBCUTANEOUS | Status: DC
Start: 2021-04-20 — End: 2021-04-22
  Administered 2021-04-20 – 2021-04-21 (×2): 25 [IU] via SUBCUTANEOUS
  Filled 2021-04-20 (×2): qty 25

## 2021-04-20 MED ORDER — GLUCAGON 1 MG IJ SOLR (WRAP)
1.0000 mg | INTRAMUSCULAR | Status: DC | PRN
Start: 2021-04-20 — End: 2021-04-22

## 2021-04-20 MED ORDER — FUROSEMIDE 10 MG/ML IJ SOLN
40.0000 mg | Freq: Two times a day (BID) | INTRAMUSCULAR | Status: DC
Start: 2021-04-20 — End: 2021-04-22
  Administered 2021-04-20 – 2021-04-21 (×3): 40 mg via INTRAVENOUS
  Filled 2021-04-20 (×3): qty 4

## 2021-04-20 MED ORDER — APIXABAN 5 MG PO TABS
5.0000 mg | ORAL_TABLET | Freq: Two times a day (BID) | ORAL | Status: DC
Start: 2021-04-20 — End: 2021-04-22
  Administered 2021-04-20 – 2021-04-22 (×4): 5 mg via ORAL
  Filled 2021-04-20 (×4): qty 1

## 2021-04-20 NOTE — Plan of Care (Signed)
NURSING SHIFT NOTE     Patient: [name]  Day: 0      SHIFT EVENTS     Shift Narrative/Significant Events (PRN med administration, fall, RRT, etc.):     Pt arrived on the floor from ED at about 1540. Pt settled in room, oriented to surrounding. Tele applied. Vital signs done. Spanish speaking, telephonic interpreter contacted.  Pt alert and oriented x4. Pt denies CP, dizziness. RA, AFIB on tele, no evidence of distress noted. On IV lasix. Plan of care discussed with pt, verbalized understanding. Safety and fall precautions remain in place. Purposeful rounding completed. PRN Labetalol given for BP. Will continue to monitor.         ASSESSMENT     Changes in assessment from patient's baseline this shift:    Neuro: No  CV: No  Pulm: No  Peripheral Vascular: No  HEENT: No  GI: No  BM during shift: No, Last BM: Last BM Date: 04/19/21  GU: No   Integ: No  MS: No    Pain: Improved  Pain Interventions: Rest  Medications Utilized:     Mobility: PMP Activity: Step 6 - Walks in Room of Distance Walked (ft) (Step 6,7): 60 Feet           Lines     Patient Lines/Drains/Airways Status       Active Lines, Drains and Airways       Name Placement date Placement time Site Days    Peripheral IV 04/20/21 20 G Right Antecubital 04/20/21  1057  Antecubital  less than 1                         VITAL SIGNS     Vitals:    04/20/21 1617   BP: (!) 203/80   Pulse: 75   Resp:    Temp:    SpO2:        Temp  Min: 97.7 F (36.5 C)  Max: 98.6 F (37 C)  Pulse  Min: 61  Max: 76  Resp  Min: 17  Max: 24  BP  Min: 174/82  Max: 218/91  SpO2  Min: 94 %  Max: 98 %    No intake or output data in the 24 hours ending 04/20/21 1720         CARE PLAN   Problem: Day of Admission - Heart Failure  Goal: Heart Failure Admission  Flowsheets (Taken 04/20/2021 1719)  Heart Failure Admission:   Standing Weight on admission, if unable to stand zero the bed and use the bed scale   Strict Intake/Output   Vital signs and telemetry per policy   Fluid restriction    Initiate education with patient and caregiver using CHF Warning Zones and Educational Videos ([employer] or [employer])   Assess for swelling/edema and document     Problem: Everyday - Heart Failure  Goal: Stable Vital Signs and Fluid Balance  Flowsheets (Taken 04/20/2021 1719)  Stable Vital Signs and Fluid Balance:   Daily Standing Weights in the morning using the same scale, after using the bathroom and before breadfast.  If unable to stand, zero the bed and use the bed scale   Monitor, assess vital signs and telemetry per policy   Fluid Restriction   Monitor labs and report abnormalities to physician   Strict Intake/Output   Assess for swelling/edema  Goal: Mobility/Activity is Maintained at Optimal Level for Patient  Flowsheets (Taken 04/20/2021 1719)  Mobility/Activity  is Maintained at Optimal Level for Patient:   Increase mobility as tolerated/progressive mobility protocol   Maintain SCD's as Ordered  Goal: Teaching-Using CHF Warning Zones and Educational Videos  Flowsheets (Taken 04/20/2021 1719)  Teaching-Using CHF Warning Zones and Educational Videos:   Signs & Symptoms of CHF   Sodium Restriction   Daily Standing Weights & record   Fluid Restriction if appropriate   Medications     Problem: Safety  Goal: Patient will be free from injury during hospitalization  Flowsheets (Taken 04/20/2021 1719)  Patient will be free from injury during hospitalization:   Assess patient's risk for falls and implement fall prevention plan of care per policy   Provide and maintain safe environment   Ensure appropriate safety devices are available at the bedside   Hourly rounding     Problem: Pain  Goal: Pain at adequate level as identified by patient  Flowsheets (Taken 04/20/2021 1719)  Pain at adequate level as identified by patient:   Identify patient comfort function goal   Assess for risk of opioid induced respiratory depression, including snoring/sleep apnea. Alert healthcare team of risk factors identified.   Assess pain on  admission, during daily assessment and/or before any "as needed" intervention(s)

## 2021-04-20 NOTE — ED Notes (Signed)
[facility]ENC[facility] DEPART[facility]  ED NURSING NOTE FOR THE RECEIVING INPATIENT NURSE   ED NURSE Eloise Levels [phone]   ED CHARGE RN [phone]   ADMISSION INFORMATION   [name] is a 66 y.o. female admitted with a diagnosis of:    1. Pulmonary edema, acute         Isolation: None   Allergies: Shellfish-derived products   Holding Orders confirmed? Yes   Belongings Documented? Yes   Home medications sent to pharmacy confirmed? No   NURSING CARE   Patient Comes From:   Mental Status: Home Independent  alert and oriented   ADL: Independent with all ADLs   Ambulation: Pt needed one assist when ambulating to and from restroom   Pertinent Information  and Safety Concerns: Pt in good spirits.      COVID Test sent to lab? Yes   VITAL SIGNS   Time BP Temp Pulse Resp SpO2   1347 199/88 98.6 68 17 98.6   CT / NIH   CT Head ordered on this patient?  No   NIH/Dysphagia assessment done prior to admission? No   PERSONAL PROTECTIVE EQUIPMENT   Gloves and Procedure Mask with Shield   LAB RESULTS   Labs Reviewed   COMPREHENSIVE METABOLIC PANEL - Abnormal; Notable for the following components:       Result Value    Glucose 211 (*)     Alkaline Phosphatase 131 (*)     All other components within normal limits   HIGH SENSITIVITY TROPONIN-I - Abnormal; Notable for the following components:    hs Troponin-I 56.5 (*)     All other components within normal limits   PROBNP - Abnormal; Notable for the following components:    NT-proBNP 2,375 (*)     All other components within normal limits   HIGH SENSITIVITY TROPONIN-I - Abnormal; Notable for the following components:    hs Troponin-I 54.3 (*)     All other components within normal limits   COVID-19 (SARS-COV-2)    Narrative:     o Collect and clearly label specimen type:  o PREFERRED-Upper respiratory specimen: One Nasal Swab in  [employer].  o Hand deliver to laboratory ASAP  Indication for testing->Extended care facility admission to  semi private room  Screening   CBC AND  DIFFERENTIAL   GFR   TSH   GLUCOSE WHOLE BLOOD - POCT          Ticket to Ride Printed: Yes

## 2021-04-20 NOTE — H&P (Signed)
[facility]      Patient: [name]  Date: 04/20/2021   DOB: 1955/11/13  Admission Date: 04/20/2021   MRN: [id number]  Attending: Alvy Beal, MD         Chief Complaint   Patient presents with    Shortness of Breath    Palpitations      History Gathered From: Patient, family and ED physician    HISTORY AND PHYSICAL     [name] is a 66 y.o. female with a PMHx of essential hypertension, dyslipidemia, type 2 diabetes mellitus, coronary artery disease, paroxysmal A-fib, class I obesity, glaucoma and history of CHF with diastolic dysfunction, who presented with progressive shortness of breath for last 3 to 4 days.  Patient does have exertional dyspnea, orthopnea and paroxysmal nocturnal dyspnea.  Patient denies any significant lower extremity swelling or weight gain.  No chest pain, heart palpitation, fever or chills.  Patient also denies any nausea, vomiting, abdominal pain, dysuria or hematuria.  In the emergency room patient was noted to have elevated BNP, 2375.  Slightly elevated troponin and chest x-ray is consistent with pulmonary congestion and edema.  Patient is being admitted for further management of acute on chronic CHF with diastolic dysfunction.    Past Medical History:   Diagnosis Date    Congenital anomaly of heart     Diabetes mellitus type 2 in obese     Diabetic retinopathy 06/28/2013    Of the right eye, per patient    Glaucoma 06/01/2012    Hyperlipidemia     Hypertension     Obesity     Venous insufficiency 09/17/2014    Followed at [facility].  Wears compression stockings.        Past Surgical History:   Procedure Laterality Date    BREAST CYST EXCISION  1993    bilateral    TUBAL LIGATION         Prior to Admission medications    Medication Sig Start Date End Date Taking? Authorizing Provider   apixaban (ELIQUIS) 5 MG Take 1 tablet (5 mg total) by mouth every 12 (twelve) hours 10/15/20  Yes Gami, Berline Chough, MD   atorvastatin (LIPITOR) 40 MG tablet Take 1  tablet by mouth every evening   Yes [provider]   bumetanide (BUMEX) 1 MG tablet Take 1 mg by mouth Once daily every Sunday, Tuesday, Thursday and Saturday evening   Yes [provider]   bumetanide (BUMEX) 1 MG tablet Take by mouth Once every Monday, Wednesday and Friday morning 2 mg MWF   Yes [provider]   Calcium Carbonate-Vitamin D (CALCIUM-D PO) Take 1 tablet by mouth daily.   Yes [provider]   insulin NPH (NovoLIN N) 100 UNIT/ML injection INJECT 50 UNITS SUBCUTANEOUSLY WITH BREAKFST AND 20 UNITS WITH DINNER 10/15/20  Yes Gami, Berline Chough, MD   lisinopril (ZESTRIL) 40 MG tablet TAKE ONE TABLET BY MOUTH DAILY  Patient taking differently: Take 40 mg by mouth daily Med was held in December 2022 for AKI 02/26/20  Yes Castillo-Catoni, Maudry Mayhew, MD   metFORMIN (GLUCOPHAGE) 1000 MG tablet Take 1 tablet (1,000 mg total) by mouth 2 (two) times daily with meals 03/23/20  Yes Castillo-Catoni, Maudry Mayhew, MD   metoprolol succinate XL (TOPROL-XL) 25 MG 24 hr tablet Take 25 mg by mouth every evening   Yes [provider]   Multiple Vitamins-Minerals (multivitamin with minerals) tablet Take 1 tablet by mouth daily  Yes [provider]   timolol (TIMOPTIC) 0.5 % ophthalmic solution Place 1 drop into both eyes daily   Yes [provider]   Blood Glucose Monitoring Suppl (ACCU-CHEK AVIVA PLUS) w/Device Kit Check blood glucose level twice daily. ICD 10 Code E11.9, Z79.4 06/11/18   Castillo-Catoni, Maudry Mayhew, MD   glucose blood (Accu-Chek Aviva) test strip Check blood glucose level twice daily. ICD 10 Code E11.9, Z79.4 03/23/20   Castillo-Catoni, Maudry Mayhew, MD   Insulin Syringe-Needle U-100 (BD INSULIN SYRINGE ULTRAFINE) 31G X 5/16" 1 ML Misc ADMINSTER INSULIN UNDER THE SKIN 2 TIMES A DAY 10/15/20   Gami, Berline Chough, MD       Allergies   Allergen Reactions    Shellfish-Derived Products        CODE STATUS: full code    PRIMARY CARE MD: Melvenia Beam, MD    Family History    Problem Relation Age of Onset    Heart disease Mother     Diabetes Mother     Hypertension Mother     Hyperlipidemia Mother     Heart attack Mother     Heart disease Father     Diabetes Father     Hypertension Father     Hyperlipidemia Father     Cirrhosis Father     Diabetes Sister     Liver cancer Maternal Aunt     Cancer Maternal Uncle         liver       Social History     Tobacco Use    Smoking status: Never    Smokeless tobacco: Never   Vaping Use    Vaping Use: Never used   Substance Use Topics    Alcohol use: No    Drug use: No       REVIEW OF SYSTEMS   Positive for: Shortness of breath, exertional dyspnea, orthopnea, PND  Negative for: Fever, chills, cough, sputum production, pleuritic chest pain, nausea or vomiting.  All ROS completed and otherwise negative.    PHYSICAL EXAM   No acute distress  Vital Signs (most recent): BP 169/83   Pulse 65   Temp 98.4 F (36.9 C) (Oral)   Resp 17   Ht 1.676 m (5\' 6" )   Wt 84.4 kg (186 lb)   SpO2 97%   BMI 30.02 kg/m   Constitutional: No apparent distress. Patient speaks freely in full sentences.   HEENT: NC/AT, PERRL, no scleral icterus or conjunctival pallor, no nasal discharge, MMM, oropharynx without erythema or exudate  Neck: trachea midline, supple, no cervical or supraclavicular lymphadenopathy or masses  Cardiovascular: RRR, normal S1 S2, no murmurs, gallops, palpable thrills, no JVD, Non-displaced PMI.  Respiratory: Normal rate. No retractions or increased work of breathing.  Bilateral Rales present.  Gastrointestinal: +BS, non-distended, soft, non-tender, no rebound or guarding, no hepatosplenomegaly  Genitourinary: no suprapubic or costovertebral angle tenderness  Musculoskeletal: ROM and motor strength grossly normal. No clubbing,  or cyanosis. DP and radial pulses 2+ and symmetric.  1+ bilateral edema present.  Skin exam:  pink  Neurologic: EOMI, CN 2-12 grossly intact. no gross motor or sensory deficits  Psychiatric: AAOx3, affect and mood  appropriate. The patient is alert, interactive, appropriate.  Capillary refill:  Normal    Exam done by Alvy Beal, MD on 04/20/21 at 9:25 PM      LABS & IMAGING     Recent Results (from the past 24 hour(s))   CBC and differential  Collection Time: 04/20/21 11:01 AM   Result Value Ref Range    WBC 8.99 3.10 - 9.50 x10 3/uL    Hgb 12.8 11.4 - 14.8 g/dL    Hematocrit 16.1 09.6 - 43.7 %    Platelets 248 142 - 346 x10 3/uL    RBC 4.21 3.90 - 5.10 x10 6/uL    MCV 91.0 78.0 - 96.0 fL    MCH 30.4 25.1 - 33.5 pg    MCHC 33.4 31.5 - 35.8 g/dL    RDW 13 11 - 15 %    MPV 11.1 8.9 - 12.5 fL    Instrument Absolute Neutrophil Count 6.25 1.10 - 6.33 x10 3/uL    Neutrophils 69.7 None %    Lymphocytes Automated 17.9 None %    Monocytes 8.2 None %    Eosinophils Automated 3.4 None %    Basophils Automated 0.4 None %    Immature Granulocytes 0.4 None %    Nucleated RBC 0.0 0.0 - 0.0 /100 WBC    Neutrophils Absolute 6.25 1.10 - 6.33 x10 3/uL    Lymphocytes Absolute Automated 1.61 0.42 - 3.22 x10 3/uL    Monocytes Absolute Automated 0.74 0.21 - 0.85 x10 3/uL    Eosinophils Absolute Automated 0.31 0.00 - 0.44 x10 3/uL    Basophils Absolute Automated 0.04 0.00 - 0.08 x10 3/uL    Immature Granulocytes Absolute 0.04 0.00 - 0.07 x10 3/uL    Absolute NRBC 0.00 0.00 - 0.00 x10 3/uL   Comprehensive metabolic panel    Collection Time: 04/20/21 11:01 AM   Result Value Ref Range    Glucose 211 (H) 70 - 100 mg/dL    BUN 04.5 7.0 - 40.9 mg/dL    Creatinine 1.0 0.4 - 1.0 mg/dL    Sodium 811 914 - 782 mEq/L    Potassium 4.8 3.5 - 5.3 mEq/L    Chloride 105 99 - 111 mEq/L    CO2 28 17 - 29 mEq/L    Calcium 9.3 8.5 - 10.5 mg/dL    Protein, Total 7.3 6.0 - 8.3 g/dL    Albumin 3.8 3.5 - 5.0 g/dL    AST (SGOT) 31 5 - 41 U/L    ALT 25 0 - 55 U/L    Alkaline Phosphatase 131 (H) 37 - 117 U/L    Bilirubin, Total 0.6 0.2 - 1.2 mg/dL    Globulin 3.5 2.0 - 3.6 g/dL    Albumin/Globulin Ratio 1.1 0.9 - 2.2    Anion Gap 8.0 5.0 - 15.0   High Sensitivity  Troponin-I    Collection Time: 04/20/21 11:01 AM   Result Value Ref Range    hs Troponin-I 56.5 (A) SEE BELOW ng/L   GFR    Collection Time: 04/20/21 11:01 AM   Result Value Ref Range    EGFR 55.5     NT-proBNP    Collection Time: 04/20/21 11:01 AM   Result Value Ref Range    NT-proBNP 2,375 (H) 0 - 125 pg/mL   High Sensitivity Troponin-I    Collection Time: 04/20/21  1:16 PM   Result Value Ref Range    hs Troponin-I 54.3 (A) SEE BELOW ng/L   COVID-19 (SARS-CoV-2) only ([facility]) asymptomatic admission    Collection Time: 04/20/21  2:07 PM    Specimen: Nasopharyngeal   Result Value Ref Range    Purpose of COVID testing Screening     SARS-CoV-2 Specimen Source Nasal Swab     SARS CoV 2 Overall Result  Not Detected    Glucose Whole Blood - POCT    Collection Time: 04/20/21  2:30 PM   Result Value Ref Range    Whole Blood Glucose POCT 87 70 - 100 mg/dL   Glucose Whole Blood - POCT    Collection Time: 04/20/21  4:48 PM   Result Value Ref Range    Whole Blood Glucose POCT 105 (H) 70 - 100 mg/dL   TSH    Collection Time: 04/20/21  5:21 PM   Result Value Ref Range    TSH 0.73 0.35 - 4.94 uIU/mL   Glucose Whole Blood - POCT    Collection Time: 04/20/21  8:11 PM   Result Value Ref Range    Whole Blood Glucose POCT 176 (H) 70 - 100 mg/dL   High Sensitivity Troponin-I (Every 6 hours)    Collection Time: 04/20/21  8:38 PM   Result Value Ref Range    hs Troponin-I 50.3 (A) SEE BELOW ng/L       MICROBIOLOGY:  Blood Culture:n/a  Urine Culture: n/a  Antibiotics Started: n/a    IMAGING:  Upon my review: Chest x-ray consistent with pulmonary congestion and edema pattern.    Markers:  Recent Labs   Lab 04/20/21  2038 04/20/21  1316 04/20/21  1101   hs Troponin-I 50.3* 54.3* 56.5*       EMERGENCY DEPARTMENT COURSE:  Orders Placed This Encounter   Procedures    COVID-19 (SARS-CoV-2) only ([facility]) asymptomatic admission    Chest AP Portable    CBC and differential    Comprehensive metabolic panel    High Sensitivity Troponin-I     GFR    NT-proBNP    High Sensitivity Troponin-I    Basic Metabolic Panel    CBC without differential    Hemoglobin A1C    Basic Metabolic Panel    High Sensitivity Troponin-I (Every 6 hours)    TSH    Diet regular Na restriction, if any: 2 GM NA; Fluid restriction: 1500 ML FLUID; Additional restrictions: CONSISTENT CARBOHYDRATE    Vital Signs    Vital signs with Pulse Ox (per unit protocol)    Progressive Mobility Protocol    Notify physician    Activate Heart Failure Clinical Pathway    Intake and Output    Height    Weight    Weigh patient    Skin assessment    Place sequential compression device    Maintain sequential compression device    Patient does not have a Cardiac Rehab qualifying diagnosis    Education: Activity    Education: Disease Process & Condition    Education: Heart Disease    Education: Heart Clinical Care    Order for Screening Procedural  POC Glucose    Notify Physician (Critical Blood Glucose Value)    Notify physician (Communication: Document Abnormal Blood Glucose)    Target Glucose Goals    Nursing communication    NSG Communication: Glucose POCT order (PRN hypoglycemia)    If NPO, give half NPH dose    Nursing communication: Adult Hypoglycemia Treatment Algorithm    NSG Communication: Glucose POCT order    NSG Communication: Glucose POCT order    Lab instructions    Telemetry Box Communication    Full Code    Consult to Case Management/Social Work    Glucose Whole Blood - POCT    Glucose Whole Blood - POCT    Glucose Whole Blood - POCT    ECG 12 Lead  ECG 12 lead (Electrocardiogram)    Saline lock IV    Admit to Inpatient    Supervise For Meals Frequency: All meals       ASSESSMENT & PLAN     [name] is a 66 y.o. female admitted under medicine service with Pulmonary edema, acute.    Patient Active Hospital Problem List:       Pulmonary edema, acute (04/20/2021)           [name] is a 66 y.o. female with a PMHx of essential hypertension, dyslipidemia, type 2 diabetes  mellitus, coronary artery disease, paroxysmal A-fib, class I obesity, glaucoma and history of CHF with diastolic dysfunction, who presented with progressive shortness of breath for last 3 to 4 days.  She is noted to have acute on chronic CHF with diastolic dysfunction.    #Acute on chronic CHF with diastolic dysfunction:  -Will admit patient and will start patient on Lasix 40 mg IV twice daily.  -Continue monitor intake and output.  -Monitor lytes and renal functions.  -Daily weight.  -Fluid restriction 1500 mL per 24-hour.  -Patient has echocardiogram done in Dr. Arletta Bale office last year, will get record of echo result.  -Continue metoprolol succinate and lisinopril.    #Hypertensive urgency:  -We will start patient on lisinopril and increase metoprolol XL to 50 mg p.o. daily.  -Start patient on labetalol as needed for optimal blood pressure control.    #Type 2 diabetes mellitus:  -Uncontrolled with hyperglycemia.  -We will start patient on Lantus and lispro and closely monitor.    #Dyslipidemia:  -Continue statins.    #Class I obesity:  -Lifestyle modification and weight loss.    #Paroxysmal A-fib:  -Heart rate is stable.  -Continue metoprolol succinate and Eliquis.    #Glaucoma:  -Reviewed home medications.  Nutrition  Cardiac and diabetic diet    DVT/VTE Prophylaxis  Eliquis    .  Patient has BMI=Body mass index is 30.02 kg/m.  Diagnosis: Obesity based on BMI criteria       Anticipated medical stability for discharge: Improvement in dyspnea    Service status/Reason for ongoing hospitalization: IV diuretics and dyspnea  Anticipated Discharge Needs: Home health    Signed,  Alvy Beal, MD    04/20/2021 9:25 PM  Time Elapsed: 75

## 2021-04-20 NOTE — ED Provider Notes (Signed)
Chief Complaint   Patient presents with    Shortness of Breath    Palpitations       History of Present Illness:  [name] is a 66 y.o. female with a past medical history of type 2 diabetes mellitus, hyperlipidemia, hypertension, CHF presents ED for evaluation of shortness of breath.  Patient states that she has been experiencing a 2-day history of progressively worsening shortness of breath.  Patient states that she is experiencing a mild intermittent tightness of her chest as well.  Recently switched to Bumex which she has been compliant with from furosemide.  Denies recent trauma or fall.  No productive cough.  Denies fevers.  Patient eating and drinking appropriately.  Patient states that she has been having a difficult time work for the past couple days because of her shortness of breath.  Patient notes that she was admitted for similar symptoms of "fluid overload" recently and symptoms feel similar.  Denies leg swelling or leg pain.  Denies PE or DVT history.      Past History   Past Medical History--reviewed, as per HPI  Past Surgical History--reviewed, no relevant history  Family History--reviewed, no relevant history  Social History--reviewed, no relevant history  Allergies reviewed and documented as pertinent to the case.     Home Medications       Med List Status: Pharmacy Completed Set By: Mosie Lukes, RPH at 04/20/2021  1:12 PM              apixaban (ELIQUIS) 5 MG     Take 1 tablet (5 mg total) by mouth every 12 (twelve) hours     atorvastatin (LIPITOR) 40 MG tablet     Take 1 tablet by mouth every evening     Blood Glucose Monitoring Suppl (ACCU-CHEK AVIVA PLUS) w/Device Kit     Check blood glucose level twice daily. ICD 10 Code E11.9, Z79.4     bumetanide (BUMEX) 1 MG tablet     Take 1 mg by mouth Once daily every Sunday, Tuesday, Thursday and Saturday evening     bumetanide (BUMEX) 1 MG tablet     Take by mouth Once every Monday, Wednesday and Friday morning 2 mg MWF     Calcium  Carbonate-Vitamin D (CALCIUM-D PO)     Take 1 tablet by mouth daily.     glucose blood (Accu-Chek Aviva) test strip     Check blood glucose level twice daily. ICD 10 Code E11.9, Z79.4     insulin NPH (NovoLIN N) 100 UNIT/ML injection     INJECT 50 UNITS SUBCUTANEOUSLY WITH BREAKFST AND 20 UNITS WITH DINNER     Insulin Syringe-Needle U-100 (BD INSULIN SYRINGE ULTRAFINE) 31G X 5/16" 1 ML Misc     ADMINSTER INSULIN UNDER THE SKIN 2 TIMES A DAY     lisinopril (ZESTRIL) 40 MG tablet     TAKE ONE TABLET BY MOUTH DAILY     Patient taking differently: Take 40 mg by mouth daily Med was held in December 2022 for AKI     metFORMIN (GLUCOPHAGE) 1000 MG tablet     Take 1 tablet (1,000 mg total) by mouth 2 (two) times daily with meals     metoprolol succinate XL (TOPROL-XL) 25 MG 24 hr tablet     Take 25 mg by mouth every evening     Multiple Vitamins-Minerals (multivitamin with minerals) tablet     Take 1 tablet by mouth daily     timolol (TIMOPTIC) 0.5 %  ophthalmic solution     Place 1 drop into both eyes daily            REVIEW OF SYSTEMS:  Otherwise as per HPI.  All other systems reviewed and negative unless otherwise noted above.    PHYSICAL EXAM:    ED Triage Vitals   Enc Vitals Group      BP 04/20/21 1016 (!) 218/91      Heart Rate 04/20/21 1016 76      Resp Rate 04/20/21 1017 (!) 24      Temp 04/20/21 1016 98.6 F (37 C)      Temp Source 04/20/21 1016 Oral      SpO2 04/20/21 1016 97 %      Weight 04/20/21 1017 84.4 kg      Height 04/20/21 1017 1.676 m      Head Circumference --       Peak Flow --       Pain Score 04/20/21 1057 4      Pain Loc --       Pain Edu? --       Excl. in GC? --        CONSTITUTIONAL:  Well appearing, no apparent distress.   EYES: Pupils are equally round and reactive to light. There is no evidence of conjunctival pallor. There is no evidence of conjunctival injection.  HENT: Normocephalic, atraumatic head. Mucous membranes moist.  Oropharynx unremarkable. There is no evidence of cervical  lymphadenopathy. No evidence of meningismus.  CARDIOVASCULAR: Heart is regular, no murmurs, rubs, or gallops.  Good peripheral pulses. Cap refill less than 2 seconds.  PULMONARY/CHEST: Crackles at the bases of the lungs bilaterally.  Mildly diminished breath sounds bilaterally.  No wheezing or rhonchi noted bilaterally.  Patient exhibits no signs of respiratory distress.  Saturating at 94% on room air.  ABDOMINAL: Soft, nontender, nondistended. No guarding or rebound, No obvious masses or organomegaly. Normal bowel sounds in all 4 quadrants.   MUSCULOSKELETAL: No deformities. No cyanosis. No pedal edema. No flank tenderness noted bilaterally.  No midline spinal tenderness noted.  NEURO: The patient is AAOx3.  There are no focal neurologic deficits.    SKIN: Warm, well perfused.  No acute rashes.  PSYCH:  Normal affect. Normal speech. No hallucinations    ED STUDIES:     Labs Reviewed   COMPREHENSIVE METABOLIC PANEL - Abnormal; Notable for the following components:       Result Value    Glucose 211 (*)     Alkaline Phosphatase 131 (*)     All other components within normal limits   HIGH SENSITIVITY TROPONIN-I - Abnormal; Notable for the following components:    hs Troponin-I 56.5 (*)     All other components within normal limits   PROBNP - Abnormal; Notable for the following components:    NT-proBNP 2,375 (*)     All other components within normal limits   HIGH SENSITIVITY TROPONIN-I - Abnormal; Notable for the following components:    hs Troponin-I 54.3 (*)     All other components within normal limits   CBC AND DIFFERENTIAL   GFR       Chest AP Portable   Final Result          The cardiomediastinal silhouette appears within normal size limits for   portable technique and patient positioning. There mild changes of   pulmonary edema. There is no evidence for pleural effusion or   pneumothorax.  [name], MD   04/20/2021 12:31 PM          ED COURSE:    Vitals:    04/20/21 1120 04/20/21 1126 04/20/21 1131  04/20/21 1347   BP: 193/82 178/88 174/82 199/88   Pulse: 62 66 75 68   Resp: 21 22 19 17    Temp:    98.6 F (37 C)   TempSrc:    Oral   SpO2: 96% 95% 95% 97%   Weight:       Height:           Medications   nitroglycerin (NITROSTAT) SL tablet 0.4 mg (0.4 mg Sublingual Given 04/20/21 1126)            PROVIDER NOTES / MDM:    I am the first provider for this patient.    I reviewed the vital signs, available nursing notes, past medical history, past surgical history, family history, social history, old medical records, outside records.    Vital Signs: Reviewed the patient's vital signs during ED stay.    I reviewed pt's pulse oxymetry and cardiac monitor values, as relevant to the case.    EKG Interpretation: Atrial fibrillation at a rate of 72, normal axis, intervals appear appropriate, no significant ST or T wave abnormalities.  No significant change with compared to prior EKG on 12/07/2018 interpreted by me    Imaging: CXR: Findings consistent with mild pulmonary vascular congestive changes.  Interpreted by me    Cardiac Monitor Interpretation:   Rhythm:  Atrial Fibrillation, Rate:  Normal, Ectopy:  None: interpreted by me    CRITICAL CARE: The high probability of sudden, clinically significant deterioration in the patient's condition required the highest level of my preparedness to intervene urgently.    The services I provided to this patient were to treat and/or prevent clinically significant deterioration that could result in: worsening of condition or death.  Services included the following: chart data review, reviewing nursing notes and/or old charts, documentation time, consultant collaboration regarding findings and treatment options, medication orders and management, direct patient care, re-evaluations, vital sign assessments and ordering, interpreting and reviewing diagnostic studies/lab tests.    Aggregate critical care time was 35 minutes, which includes only time during which I was engaged in work  directly related to the patient's care, as described above, whether at the bedside or elsewhere in the Emergency Department.  It did not include time spent performing other reported procedures or the services of residents, students, nurses or physician assistants.      For Hospitalized Patients:     1. Core Measures:     - 12-lead EKG was performed in the ED. Aspirin: Aspirin was not given because the patient did not present with a stroke at the time of their Emergency Department evaluation.12/09/2018    SEP-1 documentation    Please select option A (exclude), B (severe sepsis), or C (septic shock) from the drop-down menu below:    A. ------------Severe Sepsis/Septic Shock Exclusion----------------------------    No SEP-1 qualifying organ dysfunction during ED stay.     The timestamp from the preceding paragraph above also applies to every infectious and/or SEP-1-related diagnosis in Clinical Impression or MDM section of this note, as well as to any mention of the words "sepsis", "infection" or any infection/sepsis-related word or phrase in any part of this note.     66 y.o. female who presents to ED for evaluation of shortness of breath.  On initial evaluation, the patient was noted  to be non toxic appearing and in no acute distress.  Initial vitals were noted to be mostly stable.  Clinically well-appearing 66 year old female presents ED for evaluation of shortness of breath x2-day.  Previous history of cardiac disease with CHF with current diuretic use.  Patient presents with clinically fluid overloaded state with hypertension BP of 218/91.  Patient's clinical presentation as well as vitals consistent with pulmonary edema versus ACS versus pulmonary embolism versus pneumonia.  Laboratory evaluation shows elevated troponin and BNP with clinically fluid overloaded state patient was provided dose of Nitrostat with improvement of BP to 174/82 and as well as shortness of breath improvement.  Chest x-ray consistent with  pulmonary vascular congestive findings.  EKG is baseline and nonischemic.  Ultimately patient will require continued diuresis with reduction for likely pulmonary edema and acute CHF exacerbation.     I spoke with the inpatient team who agreed to accept this patient under their care for further management. At the time of the patient's disposition, the patient was hemodynamically stable and all questions had been answered.     DIAGNOSTIC IMPRESSION:  1. Pulmonary edema, acute        DISPOSITION: Admit    Medical Decision Making  Amount and/or Complexity of Data Reviewed  External Data Reviewed: labs, radiology, ECG and notes.  Labs: ordered. Decision-making details documented in ED Course.  Radiology: ordered and independent interpretation performed. Decision-making details documented in ED Course.  ECG/medicine tests: ordered and independent interpretation performed. Decision-making details documented in ED Course.    Risk  Prescription drug management.  Decision regarding hospitalization.      **Please note, this document was generated using voice recognition software which was designed to generate medical notes and help improve efficiency in patient care.  Unfortunately, the software does generate grammatical errors and typos.  While this note was reviewed and edited where appropriate, its possible that some of these errors may still be present.       ReddenBrett Canales, Aprill Banko E, MD  04/20/21 1400

## 2021-04-21 ENCOUNTER — Inpatient Hospital Stay (HOSPITAL_COMMUNITY): Payer: Medicare PPO

## 2021-04-21 DIAGNOSIS — I509 Heart failure, unspecified: Secondary | ICD-10-CM

## 2021-04-21 DIAGNOSIS — J810 Acute pulmonary edema: Secondary | ICD-10-CM

## 2021-04-21 LAB — ECG 12-LEAD
Q-T Interval: 402 ms
Q-T Interval: 456 ms
QRS Duration: 74 ms
QRS Duration: 84 ms
QTC Calculation (Bezet): 440 ms
QTC Calculation (Bezet): 424 ms
R Axis: 69 degrees
R Axis: 49 degrees
T Axis: 61 degrees
T Axis: 41 degrees
Ventricular Rate: 72 {beats}/min
Ventricular Rate: 52 {beats}/min

## 2021-04-21 LAB — BASIC METABOLIC PANEL
Anion Gap: 14 (ref 5.0–15.0)
BUN: 18 mg/dL (ref 7.0–21.0)
CO2: 26 mEq/L (ref 17–29)
Calcium: 9.3 mg/dL (ref 8.5–10.5)
Chloride: 104 mEq/L (ref 99–111)
Creatinine: 1 mg/dL (ref 0.4–1.0)
Glucose: 109 mg/dL — ABNORMAL HIGH (ref 70–100)
Potassium: 4 mEq/L (ref 3.5–5.3)
Sodium: 144 mEq/L (ref 135–145)

## 2021-04-21 LAB — GFR: EGFR: 55.5

## 2021-04-21 LAB — HEMOGLOBIN A1C
Average Estimated Glucose: 165.7 mg/dL
Hemoglobin A1C: 7.4 % — ABNORMAL HIGH (ref 4.6–5.9)

## 2021-04-21 LAB — GLUCOSE WHOLE BLOOD - POCT
Whole Blood Glucose POCT: 120 mg/dL — ABNORMAL HIGH (ref 70–100)
Whole Blood Glucose POCT: 181 mg/dL — ABNORMAL HIGH (ref 70–100)
Whole Blood Glucose POCT: 176 mg/dL — ABNORMAL HIGH (ref 70–100)
Whole Blood Glucose POCT: 327 mg/dL — ABNORMAL HIGH (ref 70–100)

## 2021-04-21 LAB — CBC
Absolute NRBC: 0 10*3/uL (ref 0.00–0.00)
Hematocrit: 38.5 % (ref 34.7–43.7)
Hgb: 13.3 g/dL (ref 11.4–14.8)
MCH: 30.9 pg (ref 25.1–33.5)
MCHC: 34.5 g/dL (ref 31.5–35.8)
MCV: 89.5 fL (ref 78.0–96.0)
MPV: 11.3 fL (ref 8.9–12.5)
Nucleated RBC: 0 /100 WBC (ref 0.0–0.0)
Platelets: 242 10*3/uL (ref 142–346)
RBC: 4.3 10*6/uL (ref 3.90–5.10)
RDW: 13 % (ref 11–15)
WBC: 7.89 10*3/uL (ref 3.10–9.50)

## 2021-04-21 LAB — HIGH SENSITIVITY TROPONIN-I: hs Troponin-I: 41.5 ng/L — AB

## 2021-04-21 MED ORDER — LABETALOL HCL 5 MG/ML IV SOLN (WRAP)
10.0000 mg | INTRAVENOUS | Status: DC | PRN
Start: 2021-04-21 — End: 2021-04-22

## 2021-04-21 NOTE — Plan of Care (Addendum)
1900H: Received patient.  Neuro: Aox4, GCS: 15/15, pupils equal and reactive, able to move all extremities, walks to restroom.  Cardiac: Atrial Fibrillation, Hypertensive.  Respi: Room-air saturating well.  GI: Cons, carb. Diet.  GU: Voiding well in restroom.  Integument: Skin intact.    2105H: Patient complains of headache, informed NP Johnny Bridge, ordered for Imitrex.    7829F: Informed NP Johnny Bridge that EKG done.    0710H: Endorsed to [facility] to check heart rate first before giving beta blockers.    Problem: Everyday - Heart Failure  Goal: Stable Vital Signs and Fluid Balance  Outcome: Progressing  Flowsheets (Taken 04/21/2021 0118)  Stable Vital Signs and Fluid Balance:   Daily Standing Weights in the morning using the same scale, after using the bathroom and before breadfast.  If unable to stand, zero the bed and use the bed scale   Monitor, assess vital signs and telemetry per policy   Monitor labs and report abnormalities to physician   Strict Intake/Output   Fluid Restriction   Assess for swelling/edema  Goal: Mobility/Activity is Maintained at Optimal Level for Patient  Outcome: Progressing  Flowsheets (Taken 04/21/2021 0118)  Mobility/Activity is Maintained at Optimal Level for Patient:   Increase mobility as tolerated/progressive mobility protocol   Perform active/passive ROM   Reposition patient every 2 hours and as needed unless able to reposition self   Assess for changes in respiratory status, level of consciousness and/or development of fatigue   Consult with Physical Therapy and/or Occupational Therapy     Problem: Safety  Goal: Patient will be free from injury during hospitalization  Outcome: Progressing  Flowsheets (Taken 04/21/2021 0118)  Patient will be free from injury during hospitalization:   Assess patient's risk for falls and implement fall prevention plan of care per policy   Provide and maintain safe environment   Ensure appropriate safety devices are available at the bedside   Include patient/  family/ care giver in decisions related to safety   Assess for patients risk for elopement and implement Elopement Risk Plan per policy   Provide alternative method of communication if needed (communication boards, writing)     Problem: Pain  Goal: Pain at adequate level as identified by patient  Outcome: Progressing  Flowsheets (Taken 04/21/2021 0118)  Pain at adequate level as identified by patient:   Identify patient comfort function goal   Assess pain on admission, during daily assessment and/or before any "as needed" intervention(s)   Reassess pain within 30-60 minutes of any procedure/intervention, per Pain Assessment, Intervention, Reassessment (AIR) Cycle   Evaluate if patient comfort function goal is met   Evaluate patient's satisfaction with pain management progress   Offer non-pharmacological pain management interventions   Consult/collaborate with Physical Therapy, Occupational Therapy, and/or Speech Therapy   Include patient/patient care companion in decisions related to pain management as needed

## 2021-04-21 NOTE — Progress Notes (Signed)
[facility]  PROGRESS NOTE      Patient: [name]  Date: 04/21/2021   LOS: 1 Days  Admission Date: 04/20/2021   MRN: [id number]  Attending: Hettie Holstein, MD  Please contact me on SecureChat       ASSESSMENT/PLAN     [name] is a 66 y.o. female admitted with Pulmonary edema, acute    Interval Summary:     Patient Active Hospital Problem List:       #Acute on chronic CHF with diastolic dysfunction:  Repeat 2D echo given this is an acute event to evaluate EF   This can change goal-directed medical therapy  Continue empiric IV diuresis times another 24 hours  Lasix 40 mg IV twice daily  Repeating for electrolytes in a.m.     #Hypertensive urgency:  Overall improved  Lisinopril 40 mg daily  Imdur 30 mg daily  Toprol 50 mg daily  Labetalol 10 mg IV as needed increased frequency to every 3 hours as needed    #Type 2 diabetes mellitus:  -Uncontrolled with hyperglycemia.  -We will start patient on Lantus and lispro and closely monitor.  Current regimen  Lantus 25 units nightly  Sliding scale lispro  Also on Jardiance 10 mg daily     #Dyslipidemia:  -Continue statins.     #Class I obesity:  -Lifestyle modification and weight loss.     #Paroxysmal A-fib:  -Heart rate is stable.  -Continue metoprolol succinate and Eliquis.     #Glaucoma:  -Reviewed home medications.  Nutrition  Cardiac and diabetic diet     DVT/VTE Prophylaxis  Eliquis      CODE STATUS is full    Most likely home in a.m.       SUBJECTIVE     [name] states no acute complaints at present point in time states her shortness of breath and overall chest congestion has improved with the initiation of IV diuresis.  However she feels she is still not back at her baseline.  She states her activity tolerance has decreased.    12 point review of systems otherwise negative    MEDICATIONS     Current Facility-Administered Medications   Medication Dose Route Frequency    apixaban  5 mg Oral Q12H    atorvastatin  40 mg Oral QPM     empagliflozin  10 mg Oral Daily    furosemide  40 mg Intravenous BID    insulin glargine  25 Units Subcutaneous QHS    insulin lispro  1-3 Units Subcutaneous QHS    insulin lispro  1-5 Units Subcutaneous TID AC    isosorbide mononitrate  30 mg Oral Daily    lisinopril  40 mg Oral Daily    metoprolol succinate XL  50 mg Oral Daily    timolol  1 drop Both Eyes Daily       PHYSICAL EXAM     Vitals:    04/21/21 1121   BP: 155/75   Pulse: (!) 55   Resp: 17   Temp: 97 F (36.1 C)   SpO2: 99%       Temperature: Temp  Min: 97 F (36.1 C)  Max: 98.4 F (36.9 C)  Pulse: Pulse  Min: 51  Max: 75  Respiratory: Resp  Min: 17  Max: 18  Non-Invasive BP: BP  Min: 155/75  Max: 203/80  Pulse Oximetry SpO2  Min: 95 %  Max: 99 %    Intake and Output Summary (  Last 24 hours) at Date Time    Intake/Output Summary (Last 24 hours) at 04/21/2021 1511  Last data filed at 04/21/2021 0100  Gross per 24 hour   Intake 100 ml   Output --   Net 100 ml       General  GEN APPEARANCE: Normal;  A&OX3  HEENT: PERLA; EOMI; Conjunctiva Clear  NECK: Supple; No bruits  CVS: RRR, S1, S2; No M/G/R  LUNGS: Still with scant Rales throughout the lungs no overt wheezing  ABD: Soft; No TTP; + Normoactive BS  EXT: No edema; Pulses 2+ and intact  Skin exam:  no pallor  NEURO: CN 2-12 intact; No Focal neurological deficits  CAP REFILL:  Normal  MENTAL STATUS:  Normal    Exam done by Hettie Holstein, MD on 04/21/21 at 3:11 PM      LABS     Recent Labs   Lab 04/21/21  0434 04/20/21  1101   WBC 7.89 8.99   RBC 4.30 4.21   Hgb 13.3 12.8   Hematocrit 38.5 38.3   MCV 89.5 91.0   Platelets 242 248       Recent Labs   Lab 04/21/21  0434 04/20/21  1101   Sodium 144 141   Potassium 4.0 4.8   Chloride 104 105   CO2 26 28   BUN 18.0 14.0   Creatinine 1.0 1.0   Glucose 109* 211*   Calcium 9.3 9.3       Recent Labs   Lab 04/20/21  1101   ALT 25   AST (SGOT) 31   Bilirubin, Total 0.6   Albumin 3.8   Alkaline Phosphatase 131*       Recent Labs   Lab 04/21/21  0434 04/20/21  2038  04/20/21  1316   hs Troponin-I 41.5* 50.3* 54.3*             Microbiology Results (last 15 days)       Procedure Component Value Units Date/Time    COVID-19 (SARS-CoV-2) only ([facility]) asymptomatic admission [[id number]] Collected: 04/20/21 1407    Order Status: Completed Specimen: Nasopharyngeal Updated: 04/20/21 1440     Purpose of COVID testing Screening     SARS-CoV-2 Specimen Source Nasal Swab     SARS CoV 2 Overall Result Not Detected     Comment: __________________________________________________  -A result of "Detected" indicates POSITIVE for the    presence of SARS CoV-2 RNA  -A result of "Not Detected" indicates NEGATIVE for the    presence of SARS CoV-2 RNA  __________________________________________________________  Test performed using the Roche cobas Liat SARS-CoV-2 assay. This assay is  only for use under the Food and Drug Administration's Emergency Use  Authorization. This is a real-time RT-PCR assay for the qualitative  detection of SARS-CoV-2 RNA. Viral nucleic acids may persist in vivo,  independent of viability. Detection of viral nucleic acid does not imply the  presence of infectious virus, or that virus nucleic acid is the cause of  clinical symptoms. Negative results do not preclude SARS-CoV-2 infection and  should not be used as the sole basis for diagnosis, treatment or other  patient management decisions. Negative results must be combined with  clinical observations, patient history, and/or epidemiological information.  Invalid results may be due to inhibiting substances in the specimen and  recollection should occur. Please see Fact Sheets for patients and providers  located:  [facility]log.no         Narrative:  o Collect and clearly label specimen type:  o PREFERRED-Upper respiratory specimen: One Nasal Swab in  Transport Media.  o Hand deliver to laboratory ASAP  Indication for testing->Extended care facility admission to  semi private  room  Screening             RADIOLOGY     Chest AP Portable    Result Date: 04/20/2021   The cardiomediastinal silhouette appears within normal size limits for portable technique and patient positioning. There mild changes of pulmonary edema. There is no evidence for pleural effusion or pneumothorax. [name], MD 04/20/2021 12:31 PM   Echo Results       None          No results found for this or any previous visit.    SignedHettie Holstein, MD  3:12 PM 04/21/2021

## 2021-04-21 NOTE — Plan of Care (Signed)
NURSING SHIFT NOTE     Patient: [name]  Day: 2      SHIFT EVENTS     Shift Narrative/Significant Events (PRN med administration, fall, RRT, etc.):         Safety and fall precautions remain in place. Purposeful rounding completed.          ASSESSMENT     Changes in assessment from patient's baseline this shift:    Neuro: No  CV: No  Pulm: No  Peripheral Vascular: No  HEENT: No  GI: No  BM during shift: No, Last BM: Last BM Date: 04/21/21  GU: No   Integ: No  MS: No    Pain: None      Mobility: PMP Activity: Step 6 - Walks in Room of Distance Walked (ft) (Step 6,7): 30 Feet           CARE PLAN        Problem: Everyday - Heart Failure  Goal: Mobility/Activity is Maintained at Optimal Level for Patient  Outcome: Progressing  Flowsheets (Taken 04/21/2021 2050)  Mobility/Activity is Maintained at Optimal Level for Patient:   Maintain SCD's as Ordered   Assess for changes in respiratory status, level of consciousness and/or development of fatigue     Problem: Everyday - Heart Failure  Goal: Stable Vital Signs and Fluid Balance  Flowsheets (Taken 04/21/2021 2050)  Stable Vital Signs and Fluid Balance:   Monitor, assess vital signs and telemetry per policy   Fluid Restriction   Assess for swelling/edema   Monitor labs and report abnormalities to physician   Strict Intake/Output   Wean oxygen as needed if appropriate

## 2021-04-21 NOTE — Plan of Care (Signed)
NURSING SHIFT NOTE     Patient: [name]  Day: 1      SHIFT EVENTS     Shift Narrative/Significant Events (PRN med administration, fall, RRT, etc.):     Pt alert and oriented x4. Pt denies CP, dizziness, SOB. RA, AFIB on tele, no evidence of distress noted. On IV lasix. Plan of care discussed with pt, verbalized understanding. Safety and fall precautions remain in place. Purposeful rounding completed. will continue to monitor.       ASSESSMENT     Changes in assessment from patient's baseline this shift:    Neuro: No  CV: No  Pulm: No  Peripheral Vascular: No  HEENT: No  GI: No  BM during shift: Yes   , Last BM: Last BM Date: 04/19/21  GU: No   Integ: No  MS: No    Pain: None  Pain Interventions: NA  Medications Utilized: NA    Mobility: PMP Activity: Step 6 - Walks in Room of [employer] (ft) (Step 6,7): 30 Feet           Lines     Patient Lines/Drains/Airways Status       Active Lines, Drains and Airways       Name Placement date Placement time Site Days    Peripheral IV 04/20/21 20 G Right Antecubital 04/20/21  1057  Antecubital  1                         VITAL SIGNS     Vitals:    04/21/21 0859   BP: 155/89   Pulse: 65   Resp:    Temp:    SpO2:        Temp  Min: 97.2 F (36.2 C)  Max: 98.6 F (37 C)  Pulse  Min: 51  Max: 75  Resp  Min: 17  Max: 22  BP  Min: 155/89  Max: 203/80  SpO2  Min: 95 %  Max: 98 %      Intake/Output Summary (Last 24 hours) at 04/21/2021 1105  Last data filed at 04/21/2021 0100  Gross per 24 hour   Intake 100 ml   Output --   Net 100 ml          CARE PLAN   Problem: Everyday - Heart Failure  Goal: Stable Vital Signs and Fluid Balance  Outcome: Progressing  Flowsheets (Taken 04/21/2021 1104)  Stable Vital Signs and Fluid Balance:   Daily Standing Weights in the morning using the same scale, after using the bathroom and before breadfast.  If unable to stand, zero the bed and use the bed scale   Fluid Restriction   Monitor, assess vital signs and telemetry per policy   Monitor  labs and report abnormalities to physician   Strict Intake/Output   Assess for swelling/edema  Goal: Mobility/Activity is Maintained at Optimal Level for Patient  Outcome: Progressing  Flowsheets (Taken 04/21/2021 1104)  Mobility/Activity is Maintained at Optimal Level for Patient: Increase mobility as tolerated/progressive mobility protocol  Goal: Teaching-Using CHF Warning Zones and Educational Videos  Outcome: Progressing  Flowsheets (Taken 04/21/2021 1104)  Teaching-Using CHF Warning Zones and Educational Videos:   Signs & Symptoms of CHF   Sodium Restriction   Daily Standing Weights & record   CHF Warning Zones and when to call for help   Medications   Fluid Restriction if appropriate     Problem: Safety  Goal: Patient will be free  from injury during hospitalization  Outcome: Progressing  Flowsheets (Taken 04/21/2021 1104)  Patient will be free from injury during hospitalization:   Assess patient's risk for falls and implement fall prevention plan of care per policy   Provide and maintain safe environment   Ensure appropriate safety devices are available at the bedside   Hourly rounding     Problem: Pain  Goal: Pain at adequate level as identified by patient  Outcome: Progressing  Flowsheets (Taken 04/21/2021 1104)  Pain at adequate level as identified by patient:   Assess for risk of opioid induced respiratory depression, including snoring/sleep apnea. Alert healthcare team of risk factors identified.   Identify patient comfort function goal   Assess pain on admission, during daily assessment and/or before any "as needed" intervention(s)

## 2021-04-21 NOTE — UM Notes (Signed)
Admit to Inpatient (Order [id number]) on 04/20/21        PATIENT NAME: [name],[name]   DOB: 22-Jul-1955     Reason for Admission  66 y.o. female with a PMHx of essential hypertension, dyslipidemia, type 2 diabetes mellitus, coronary artery disease, paroxysmal A-fib, class [name] obesity, glaucoma and history of CHF with diastolic dysfunction, who presented with progressive shortness of breath for last 3 to 4 days.  Patient does have exertional dyspnea, orthopnea and paroxysmal nocturnal dyspnea. In the emergency room patient was noted to have elevated BNP, 2375.  Slightly elevated troponin and chest x-ray is consistent with pulmonary congestion and edema.  Patient is being admitted for further management of acute on chronic CHF with diastolic dysfunction.    MEDS Given:   furosemide (LASIX) injection 40 mg  Start: 04/20/21 1630     labetalol (NORMODYNE,TRANDATE) injection 10 mg  Start: 04/20/21 1409         Temp:  [97.2 F (36.2 C)-98.6 F (37 C)] 98.2 F (36.8 C)  Heart Rate:  [51-75] 65  Resp Rate:  [17-22] 18  BP: (155-203)/(69-89) 155/89   WT 84.4 kg     Scheduled Meds:  Current Facility-Administered Medications   Medication Dose Route Frequency    apixaban  5 mg Oral Q12H    atorvastatin  40 mg Oral QPM    empagliflozin  10 mg Oral Daily    furosemide  40 mg Intravenous BID    insulin glargine  25 Units Subcutaneous QHS    insulin lispro  1-3 Units Subcutaneous QHS    insulin lispro  1-5 Units Subcutaneous TID AC    isosorbide mononitrate  30 mg Oral Daily    lisinopril  40 mg Oral Daily    metoprolol succinate XL  50 mg Oral Daily    timolol  1 drop Both Eyes Daily     Continuous Infusions:  PRN Meds:.Nursing communication: Adult Hypoglycemia Treatment Algorithm **AND** dextrose **AND** dextrose **AND** dextrose **AND** glucagon (rDNA), labetalol, nitroglycerin     Abnormal Labs   Lab 04/20/21  2038 04/20/21  1316 04/20/21  1101   hs Troponin-[name] 50.3* 54.3* 56.5*     BNP, 2375  Imaging chest x-ray is consistent  with pulmonary congestion and edema.    Exam:Positive for: Shortness of breath, exertional dyspnea, orthopnea, PND  LUNGS: Bilateral Rales present.    Plan of Care  Acute on chronic CHF with diastolic dysfunction:  -Will admit patient and will start patient on Lasix 40 mg IV twice daily.  -Continue monitor intake and output.  -Monitor lytes and renal functions.  -Daily weight.  -Fluid restriction 1500 mL per 24-hour.  -Patient has echocardiogram done in Dr. Arletta Bale office last year, will get record of echo result.  -Continue metoprolol succinate and lisinopril.   Hypertensive urgency:  -We will start patient on lisinopril and increase metoprolol XL to 50 mg p.o. daily.  -Start patient on labetalol as needed for optimal blood pressure control.   Type 2 diabetes mellitus:  -Uncontrolled with hyperglycemia.  -We will start patient on Lantus and lispro and closely monitor.   Dyslipidemia:  -Continue statins.    UTILIZATION REVIEW CONTACT: Faythe Dingwall  RN, BSN  Utilization Review   [facility]  [address]  [address]  [place], [place] [address]  Phone: [phone] (Voice Mail Only)  Email: [email or web address]  Tax ID:  [id number]         NOTES TO REVIEWER:  This clinical review is based on/compiled from documentation provided by the treatment team within the patient's medical record.

## 2021-04-21 NOTE — PT Eval Note (Signed)
.  [facility]  Physical Therapy Attempt Note    Patient:  [name] MRN#:  [id number]  Unit:  [facility] Room/Bed:  A2508/A2508-B      Physical Therapy evaluation attempted on 04/21/2021 at 12:07 PM unable to complete secondary to  RN reporting pt is independent.    New orders will needed if pt's status changes.      RN aware.     Alanson Puls, PT,DPT  [phone]  04/21/2021 12:08 PM  Hours:W-F 9-530

## 2021-04-22 LAB — ECHOCARDIOGRAM ADULT COMPLETE W CLR/ DOPP WAVEFORM
AV Peak Velocity: 102.692
AV Peak Velocity: 111.576
AV Peak Velocity: 99.138
IVS Diastolic Thickness (2D): 0.807
LA Dimension (2D): 4.372
LA Volume Index (BP A-L): 0.033
LVID diastole (2D): 4.156
LVID systole (2D): 2.791
MV Area (PHT): 2.926
MV E/e' (Average): 6.708
Mitral Valve Findings: NORMAL
Prox Ascending Aorta Diameter: 3.092
Pulmonary Valve Findings: NORMAL
RV Basal Diastolic Dimension: 2.634
RV Function: NORMAL
RV Systolic Pressure: 31.98
RV Systolic Pressure: 31.98
Site RA Size (AS): NORMAL
Site RV Size (AS): NORMAL
TAPSE: 1.348
TAPSE: 1.623
Tricuspid Valve Findings: NORMAL

## 2021-04-22 LAB — GFR
EGFR: 37.7
EGFR: 37.7

## 2021-04-22 LAB — GLUCOSE WHOLE BLOOD - POCT
Whole Blood Glucose POCT: 175 mg/dL — ABNORMAL HIGH (ref 70–100)
Whole Blood Glucose POCT: 238 mg/dL — ABNORMAL HIGH (ref 70–100)
Whole Blood Glucose POCT: 258 mg/dL — ABNORMAL HIGH (ref 70–100)

## 2021-04-22 LAB — BASIC METABOLIC PANEL
Anion Gap: 13 (ref 5.0–15.0)
BUN: 32 mg/dL — ABNORMAL HIGH (ref 7.0–21.0)
CO2: 28 mEq/L (ref 17–29)
Calcium: 9.7 mg/dL (ref 8.5–10.5)
Chloride: 101 mEq/L (ref 99–111)
Creatinine: 1.4 mg/dL — ABNORMAL HIGH (ref 0.4–1.0)
Glucose: 168 mg/dL — ABNORMAL HIGH (ref 70–100)
Potassium: 3.7 mEq/L (ref 3.5–5.3)
Sodium: 142 mEq/L (ref 135–145)

## 2021-04-22 LAB — CBC
Absolute NRBC: 0 10*3/uL (ref 0.00–0.00)
Hematocrit: 42.2 % (ref 34.7–43.7)
Hgb: 14.6 g/dL (ref 11.4–14.8)
MCH: 31.2 pg (ref 25.1–33.5)
MCHC: 34.6 g/dL (ref 31.5–35.8)
MCV: 90.2 fL (ref 78.0–96.0)
MPV: 11.4 fL (ref 8.9–12.5)
Nucleated RBC: 0 /100 WBC (ref 0.0–0.0)
Platelets: 267 10*3/uL (ref 142–346)
RBC: 4.68 10*6/uL (ref 3.90–5.10)
RDW: 13 % (ref 11–15)
WBC: 8.1 10*3/uL (ref 3.10–9.50)

## 2021-04-22 LAB — CREATININE, SERUM: Creatinine: 1.4 mg/dL — ABNORMAL HIGH (ref 0.4–1.0)

## 2021-04-22 MED ORDER — EMPAGLIFLOZIN 10 MG PO TABS
10.0000 mg | ORAL_TABLET | Freq: Every day | ORAL | 3 refills | Status: DC
Start: 2021-04-23 — End: 2021-05-03

## 2021-04-22 MED ORDER — ISOSORBIDE MONONITRATE ER 30 MG PO TB24
30.0000 mg | ORAL_TABLET | Freq: Every day | ORAL | 3 refills | Status: DC
Start: 2021-04-23 — End: 2021-08-13

## 2021-04-22 MED ORDER — METOPROLOL SUCCINATE ER 50 MG PO TB24
50.0000 mg | ORAL_TABLET | Freq: Every day | ORAL | 3 refills | Status: DC
Start: 2021-04-23 — End: 2021-05-03

## 2021-04-22 MED ORDER — NOVOLIN N 100 UNIT/ML SC SUSP
SUBCUTANEOUS | 3 refills | Status: DC
Start: 2021-04-22 — End: 2021-05-03

## 2021-04-22 NOTE — Discharge Summary (Addendum)
[facility]      Patient: [name]  Admission Date: 04/20/2021   DOB: 1955-07-13  Discharge Date: 04/22/2021    MRN: [id number]  Discharge Attending:Marissa Lowrey Donalynn Furlong, MD     Referring Physician: Melvenia Beam, MD  PCP: Melvenia Beam, MD       DISCHARGE SUMMARY     Discharge Information   Admission Diagnosis:   Pulmonary edema, acute    Discharge Diagnosis:   Active Hospital Problems    Diagnosis    Pulmonary edema, acute   Acute CHF exacerbation preserved EF  Acute kidney injury  Admission Condition: Guarded  Discharge Condition: Stable  Consultants: None  Functional Status: Independent  Discharged to: [facility] Course   Presentation History     For complete details please see H&P and progress notes in epic computer system.    Please see the below HPI taken from the history and physical    [name] is a 66 y.o. female with a PMHx of essential hypertension, dyslipidemia, type 2 diabetes mellitus, coronary artery disease, paroxysmal A-fib, class I obesity, glaucoma and history of CHF with diastolic dysfunction, who presented with progressive shortness of breath for last 3 to 4 days.  Patient does have exertional dyspnea, orthopnea and paroxysmal nocturnal dyspnea.  Patient denies any significant lower extremity swelling or weight gain.  No chest pain, heart palpitation, fever or chills.  Patient also denies any nausea, vomiting, abdominal pain, dysuria or hematuria.  In the emergency room patient was noted to have elevated BNP, 2375.  Slightly elevated troponin and chest x-ray is consistent with pulmonary congestion and edema.  Patient is being admitted for further management of acute on chronic CHF with diastolic dysfunction.    Hospital Course (2 Days)     The patient was admitted into the hospital and diuresed with IV diuretics 40 mg of IV Lasix.  Discontinued through 3/15.  With true clinical improvement on 3/16.  The patient is making urine without any issue.  Her baseline  creatinine is 1.0.  She appears to have a slight rise in her creatinine to 1.4.  She is making urine without any issue.       At present point in time she can be discharged from the hospital would be hesitant to reload her with IV fluid her creatinine was checked again later in the day and is stable at 1.4 not getting any worse.  Again she is urinating without issue.  Would recommend having blood work checked in the a.m. as an outpatient and calling her ordering provider for her Bumex instructions.  If she cannot get in touch with her provider I would recommend a 48-hour diuretic holiday prior to resumption of her outpatient diuretic regimen 3/19.  In addition the patient has been started on Jardiance to help reduce her A1c and prevent readmissions for heart failure.  A 2D echo was performed that showed evidence of a preserved ejection fraction with no wall motion abnormalities.    Acute on chronic CHF with diastolic dysfunction:  Repeat 2D echo shows normal EF with normal pulmonary\pressures  Resume home medications  Diuretic holiday until 3/19  Needs blood work 3/17 to check renal function she is to call her ordering provider for further diuretic management      Acute kidney injury  Patient is nonoliguric making urine without issue  Diuretic holiday minimum 48 hours  Outpatient blood work in a.m. to see that creatinine  is either stable and/or improving  Will need further monitoring until full resolution     Hypertensive urgency:  Resolved  Lisinopril 40 mg daily  Imdur 30 mg daily  Toprol 50 mg daily       Type 2 diabetes mellitus:  -Uncontrolled with hyperglycemia.  Started on Jardiance 10 mg daily  NPH 25 units in a.m. 10 units in the evening to start  We will have to follow-up with PCP for this to be uptitrated     #Dyslipidemia:  -Continue statins.     #Class I obesity:  -Lifestyle modification and weight loss.     #Paroxysmal A-fib:  -Heart rate is stable.  -Continue metoprolol succinate and Eliquis.      #Glaucoma:  -Reviewed home medications.  Nutrition  Cardiac and diabetic diet     Elevated troponin level not clinically significant in the setting of acute kidney injury  No chest pain no ST T wave inversions  Most likely not demand ischemia    Procedures/Imaging:   Upon my review:Chest AP Portable    Result Date: 04/20/2021   The cardiomediastinal silhouette appears within normal size limits for portable technique and patient positioning. There mild changes of pulmonary edema. There is no evidence for pleural effusion or pneumothorax. [name], MD 04/20/2021 12:31 PM          Best Practices   Was the patient admitted with either a CHF Exacerbation or Pneumonia?  Yes CHF exacerbation     Progress Note/Physical Exam at Discharge     Subjective: Patient seen and examined no acute complaints whatsoever.  No chest pain or shortness of breath no fevers chills or rigors patient is able to make urine without issue.    12 point review of systems otherwise negative    Vitals:    04/22/21 0735 04/22/21 0942 04/22/21 1129 04/22/21 1531   BP: 142/76 152/71 154/67 118/65   Pulse: 61 60 (!) 56 61   Resp: 17  17 17    Temp: 97.5 F (36.4 C)  97.7 F (36.5 C) 98.2 F (36.8 C)   TempSrc: Oral  Oral Oral   SpO2: 97% 96% 98% 95%   Weight: 84 kg (185 lb 3 oz)      Height:           Physical examination    General: NAD, AAOx3  HEENT: perrla, eomi, sclera anicteric, OP: Clear, MMM  Neck: supple, FROM, no LAD  Cardiovascular: RRR, no m/r/g  Lungs: CTAB, no w/r/r  Abdomen: soft, +BS, NT/ND, no masses, no g/r  Extremities: no C/C/E  Skin: no rashes or lesions noted  Neuro: CN 2-12 intact; No Focal neurological deficits       Diagnostics     Labs/Studies Pending at Discharge: No    Last Labs   Recent Labs   Lab 04/22/21  0429 04/21/21  0434 04/20/21  1101   WBC 8.10 7.89 8.99   RBC 4.68 4.30 4.21   Hgb 14.6 13.3 12.8   Hematocrit 42.2 38.5 38.3   MCV 90.2 89.5 91.0   Platelets 267 242 248       Recent Labs   Lab 04/22/21  1614  04/22/21  0429 04/21/21  0434 04/20/21  1101   Sodium  --  142 144 141   Potassium  --  3.7 4.0 4.8   Chloride  --  101 104 105   CO2  --  28 26 28    BUN  --  32.0* 18.0  14.0   Creatinine 1.4* 1.4* 1.0 1.0   Glucose  --  168* 109* 211*   Calcium  --  9.7 9.3 9.3       Microbiology Results (last 15 days)       Procedure Component Value Units Date/Time    COVID-19 (SARS-CoV-2) only (Liat Rapid) asymptomatic admission [[id number]] Collected: 04/20/21 1407    Order Status: Completed Specimen: Nasopharyngeal Updated: 04/20/21 1440     Purpose of COVID testing Screening     SARS-CoV-2 Specimen Source Nasal Swab     SARS CoV 2 Overall Result Not Detected     Comment: __________________________________________________  -A result of "Detected" indicates POSITIVE for the    presence of SARS CoV-2 RNA  -A result of "Not Detected" indicates NEGATIVE for the    presence of SARS CoV-2 RNA  __________________________________________________________  Test performed using the Roche cobas Liat SARS-CoV-2 assay. This assay is  only for use under the Food and Drug Administration's Emergency Use  Authorization. This is a real-time RT-PCR assay for the qualitative  detection of SARS-CoV-2 RNA. Viral nucleic acids may persist in vivo,  independent of viability. Detection of viral nucleic acid does not imply the  presence of infectious virus, or that virus nucleic acid is the cause of  clinical symptoms. Negative results do not preclude SARS-CoV-2 infection and  should not be used as the sole basis for diagnosis, treatment or other  patient management decisions. Negative results must be combined with  clinical observations, patient history, and/or epidemiological information.  Invalid results may be due to inhibiting substances in the specimen and  recollection should occur. Please see Fact Sheets for patients and providers  located:  WirelessDSLBlog.no         Narrative:      o Collect and clearly label specimen  type:  o PREFERRED-Upper respiratory specimen: One Nasal Swab in  [employer].  o Hand deliver to laboratory ASAP  Indication for testing->Extended care facility admission to  semi private room  Screening             Patient Instructions   Discharge Diet: Low-sodium renal protective diabetic  Discharge Activity: Independent    Follow Up Appointment:   Follow-up Information       Gami, Berline Chough, MD .    Specialty: Internal Medicine  Contact information:  [address]  [place] [address]  [phone]                              Discharge Medications:     Medication List        START taking these medications      empagliflozin 10 MG tablet  Commonly known as: JARDIANCE  Take 1 tablet (10 mg) by mouth daily  Start taking on: April 23, 2021     isosorbide mononitrate 30 MG 24 hr tablet  Commonly known as: IMDUR  Take 1 tablet (30 mg) by mouth daily  Start taking on: April 23, 2021            CHANGE how you take these medications      lisinopril 40 MG tablet  Commonly known as: ZESTRIL  TAKE ONE TABLET BY MOUTH DAILY  What changed: additional instructions     metoprolol succinate XL 50 MG 24 hr tablet  Commonly known as: TOPROL-XL  Take 1 tablet (50 mg) by mouth daily  Start taking on: April 23, 2021  What changed:   medication strength  how much to take  when to take this     NovoLIN N 100 UNIT/ML injection  Generic drug: insulin NPH  INJECT 25 UNITS SUBCUTANEOUSLY WITH BREAKFST AND 10  UNITS WITH DINNER  What changed: additional instructions            CONTINUE taking these medications      Accu-Chek Aviva Plus w/Device Kit  Check blood glucose level twice daily. ICD 10 Code E11.9, Z79.4     apixaban 5 MG  Commonly known as: ELIQUIS  Take 1 tablet (5 mg total) by mouth every 12 (twelve) hours     atorvastatin 40 MG tablet  Commonly known as: LIPITOR     * bumetanide 1 MG tablet  Commonly known as: BUMEX     * bumetanide 1 MG tablet  Commonly known as: BUMEX     CALCIUM-D PO     glucose  blood test strip  Commonly known as: Accu-Chek Aviva  Check blood glucose level twice daily. ICD 10 Code E11.9, Z79.4     Insulin Syringe-Needle U-100 31G X 5/16" 1 ML Misc  Commonly known as: BD INSULIN SYRINGE ULTRAFINE  ADMINSTER INSULIN UNDER THE SKIN 2 TIMES A DAY     multivitamin with minerals tablet     timolol 0.5 % ophthalmic solution  Commonly known as: TIMOPTIC           * This list has 2 medication(s) that are the same as other medications prescribed for you. Read the directions carefully, and ask your doctor or other care provider to review them with you.                STOP taking these medications      metFORMIN 1000 MG tablet  Commonly known as: GLUCOPHAGE               Where to Get Your Medications        [facility] [id number] [place] - [address]  [address], [place] [id number]      Phone: [phone]   empagliflozin 10 MG tablet  isosorbide mononitrate 30 MG 24 hr tablet  metoprolol succinate XL 50 MG 24 hr tablet  NovoLIN N 100 UNIT/ML injection          Time spent examining patient, discussing with patient/family regarding hospital course, chart review, reconciling medications and discharge planning: Greater than 35 minutes  Signed,  Hettie HolsteinAyaz M Aviah Sorci, MD    5:25 PM 04/22/2021

## 2021-04-22 NOTE — Progress Notes (Signed)
1850 H- Pt cleared for discharge per MD. IV removed without complication. Telemetry box removed. Family at bedside present with pt. Discharge instructions provided, as well as education on medications, follow up appointments, diet and activity. All pt and family questions answered.

## 2021-04-22 NOTE — UM Notes (Signed)
CSR 04/21/21  CONTINUED STAY REVIEW,   24 hours  labetalol (NORMODYNE,TRANDATE) injection 10 mg  Start: 04/20/21 1409 x 1     Temp  Min: 97.2 F (36.2 C)  Max: 98.6 F (37 C)  Pulse  Min: 51  Max: 75  Resp  Min: 17  Max: 22  BP  Min: 155/89  Max: 203/80  SpO2  Min: 95 %  Max: 98 %         Vital Signs    Medications  Current Facility-Administered Medications   Medication Dose Route Frequency    apixaban  5 mg Oral Q12H    atorvastatin  40 mg Oral QPM    empagliflozin  10 mg Oral Daily    furosemide  40 mg Intravenous BID    insulin glargine  25 Units Subcutaneous QHS    insulin lispro  1-3 Units Subcutaneous QHS    insulin lispro  1-5 Units Subcutaneous TID AC    isosorbide mononitrate  30 mg Oral Daily    lisinopril  40 mg Oral Daily    metoprolol succinate XL  50 mg Oral Daily    timolol  1 drop Both Eyes Daily        Abnormal Labs  Lab 04/21/21  0434 04/20/21  2038 04/20/21  1316   hs Troponin-I 41.5* 50.3* 54.3*          Imaging Repeat CXR:  There mild changes of pulmonary edema    Exam LUNGS: Still with scant Rales throughout the lungs, activity tolerance has decreased. Headache     Plan of Care    #Acute on chronic CHF with diastolic dysfunction:  Repeat 2D echo given this is an acute event to evaluate EF   This can change goal-directed medical therapy  Continue empiric IV diuresis times another 24 hours  Lasix 40 mg IV twice daily  Repeating for electrolytes in a.m.     #Hypertensive urgency:  Overall improved  Lisinopril 40 mg daily  Imdur 30 mg daily  Toprol 50 mg daily  Labetalol 10 mg IV as needed increased frequency to every 3 hours as needed     #Type 2 diabetes mellitus:  -Uncontrolled with hyperglycemia.  -We will start patient on Lantus and lispro and closely monitor.  Current regimen  Lantus 25 units nightly  Sliding scale lispro  Also on Jardiance 10 mg daily     #Dyslipidemia:  -Continue statins.     #Class I obesity:  -Lifestyle modification and weight loss.     #Paroxysmal A-fib:  -Heart rate is  stable.  -Continue metoprolol succinate and Eliquis.     #Glaucoma:  -Reviewed home medications.  Nutrition  Cardiac and diabetic diet     DVT/VTE Prophylaxis  Eliquis        CODE STATUS is full    UTILIZATION REVIEW CONTACT: Faythe Dingwall  RN, BSN  Utilization Review   [facility]  [address]  [address]  [place], [place] [address]  Phone: [phone] (Voice Mail Only)  Email: [email or web address]  Tax ID:  [id number]         NOTES TO REVIEWER:    This clinical review is based on/compiled from documentation provided by the treatment team within the patient's medical record.

## 2021-04-22 NOTE — Discharge Instr - AVS First Page (Addendum)
Reason for your Hospital Admission:  Acute heart failure exacerbation      Instructions for after your discharge:      Your insulin requirements have changed    Please take NPH as ordered below 25 units in a.m. 10 units in the evening  Follow-up with your primary care provider for further insulin dose adjustments  You have also been started on Jardiance 10 mg daily this also help lower your A1c and help with a history of heart failure    Your 2D echo appeared normal with normal EF and wall motion    Follow-up with your provider who is prescribing Bumex  Get your blood work performed tomorrow call your provider for further Bumex dosing instructions    If you cannot get through with your provider I would resume your daily Bumex in 48 hours on Sunday 3/19  Weigh yourself on a daily basis if your weight increases by more than 2 pounds in 24 hours or if you develop shortness of breath take your Bumex and call your ordering cardiologist        It is necessary for you to get blood work tomorrow to check your kidney function your current creatinine is 1.4 this is higher than your normal function of 1.0, this is most likely secondary to the diuresis you received for an acute heart failure exacerbation here in the hospital    This should return to your normal baseline    2D echo summary    Summary    * The left atrium is normal in size.    * The left ventricle is normal in size.    * There is normal left ventricular wall thickness.    * Left ventricular segmental wall motion is normal.    * The right atrium is normal in size.    * The right ventricle is normal in size.    * Normal right ventricular systolic function.    * The aortic valve is tricuspid.    * There is no aortic stenosis.    * There is no aortic regurgitation.    * There is trace mitral regurgitation.    * There is trace tricuspid regurgitation.    * No pulmonary hypertension with estimated right ventricular systolic  pressure of  32 mmHg.    * There is mild  pulmonic regurgitation.    * No pericardial effusion visualized.    * No thrombus  or vegetation.

## 2021-04-22 NOTE — Plan of Care (Signed)
NURSING SHIFT NOTE     Patient: [name]  Day: 2      SHIFT EVENTS     Shift Narrative/Significant Events (PRN med administration, fall, RRT, etc.):     Pt A&O x4. VSS. A-fib on tele.  Echo pending.     Plan to d/c today, per MD.   -MD noted creatinine increased. MD stated pt may need a lasix holiday.      Safety and fall precautions remain in place. Purposeful rounding completed.          ASSESSMENT     Changes in assessment from patient's baseline this shift:    Neuro: No  CV: No  Pulm: No  Peripheral Vascular: No  HEENT: No  GI: No  BM during shift: No, Last BM: Last BM Date: 04/21/21  GU: No   Integ: No  MS: No    Pain: None  Pain Interventions: None  Medications Utilized: NA    Mobility: PMP Activity: Step 4 - Dangle at Bedside of Distance Walked (ft) (Step 6,7): 30 Feet           Lines     Patient Lines/Drains/Airways Status       Active Lines, Drains and Airways       Name Placement date Placement time Site Days    Peripheral IV 04/20/21 20 G Right Antecubital 04/20/21  1057  Antecubital  1                         VITAL SIGNS     Vitals:    04/22/21 0942   BP: 152/71   Pulse: 60   Resp:    Temp:    SpO2: 96%       Temp  Min: 97 F (36.1 C)  Max: 98.1 F (36.7 C)  Pulse  Min: 55  Max: 87  Resp  Min: 17  Max: 18  BP  Min: 130/64  Max: 155/75  SpO2  Min: 93 %  Max: 99 %    No intake or output data in the 24 hours ending 04/22/21 1041           CARE PLAN             Problem: Moderate/High Fall Risk Score >5  Goal: Patient will remain free of falls  Outcome: Progressing  Flowsheets (Taken 04/22/2021 1040)  Moderate Risk (6-13):   MOD-Consider activation of bed alarm if appropriate   MOD-Remain with patient during toileting   MOD-Use gait belt when appropriate     Problem: Everyday - Heart Failure  Goal: Stable Vital Signs and Fluid Balance  Outcome: Progressing  Flowsheets (Taken 04/22/2021 1040)  Stable Vital Signs and Fluid Balance:   Daily Standing Weights in the morning using the same scale, after  using the bathroom and before breadfast.  If unable to stand, zero the bed and use the bed scale   Monitor, assess vital signs and telemetry per policy   Monitor labs and report abnormalities to physician   Assess for swelling/edema   Fluid Restriction   Strict Intake/Output  Goal: Mobility/Activity is Maintained at Optimal Level for Patient  Outcome: Progressing  Flowsheets (Taken 04/22/2021 1040)  Mobility/Activity is Maintained at Optimal Level for Patient:   Increase mobility as tolerated/progressive mobility protocol   Maintain SCD's as Ordered   Perform active/passive ROM   Assess for changes in respiratory status, level of consciousness and/or development of fatigue  Problem: Safety  Goal: Patient will be free from injury during hospitalization  Outcome: Progressing  Flowsheets (Taken 04/22/2021 1040)  Patient will be free from injury during hospitalization:   Assess patient's risk for falls and implement fall prevention plan of care per policy   Provide and maintain safe environment   Ensure appropriate safety devices are available at the bedside   Include patient/ family/ care giver in decisions related to safety   [place] rounding

## 2021-04-22 NOTE — Progress Notes (Signed)
To whom it may concern    Please excuse  [name] dob 03/01/1955    From work duties starting 04/20/2021    May return to full duties 04/26/2021    Dr. Tonita Cong MD

## 2021-04-23 ENCOUNTER — Other Ambulatory Visit: Payer: Self-pay

## 2021-04-23 ENCOUNTER — Other Ambulatory Visit (FREE_STANDING_LABORATORY_FACILITY): Payer: Medicare PPO

## 2021-04-23 DIAGNOSIS — J810 Acute pulmonary edema: Secondary | ICD-10-CM

## 2021-04-23 LAB — BASIC METABOLIC PANEL
Anion Gap: 12 (ref 5.0–15.0)
BUN: 45 mg/dL — ABNORMAL HIGH (ref 7.0–21.0)
CO2: 29 mEq/L (ref 17–29)
Calcium: 9.3 mg/dL (ref 8.5–10.5)
Chloride: 96 mEq/L — ABNORMAL LOW (ref 99–111)
Creatinine: 1.6 mg/dL — ABNORMAL HIGH (ref 0.4–1.0)
Glucose: 293 mg/dL — ABNORMAL HIGH (ref 70–100)
Potassium: 5.1 mEq/L (ref 3.5–5.3)
Sodium: 137 mEq/L (ref 135–145)

## 2021-04-23 LAB — HEMOLYSIS INDEX: Hemolysis Index: 6 Index (ref 0–24)

## 2021-04-23 LAB — GFR: EGFR: 32.3

## 2021-04-23 NOTE — Progress Notes (Signed)
Ambulatory Care Management     Case manager contacted patient's home using spanish interpreter [phone]. Voicemail was left explaining reason for calling.    Case manager will attempt to reach her later today.    Arma Heading, ACM-SW  Case Manager  [facility]  [facility]  [address]  [address]  [place], [place] [address]  T [phone]

## 2021-04-27 NOTE — Progress Notes (Signed)
Ambulatory Care Management    Enrollment--    Name/Number of person who participated in call:  Pieter Partridge, daughter, [phone]    [facility] Program explained to patient and ACM contact information provided: Yes    HIPAA verification completed :  Yes     Primary language spoken:    Interpreter needed:  No        Health History--    Health Status (PMH, current admission summary, previous admission history,presenting hospital symptoms):  Past Medical History:   Diagnosis Date    Congenital anomaly of heart     Diabetes mellitus type 2 in obese     Diabetic retinopathy 06/28/2013    Of the right eye, per patient    Glaucoma 06/01/2012    Hyperlipidemia     Hypertension     Obesity     Venous insufficiency 09/17/2014    Followed at [facility].  Wears compression stockings.      Patient Active Problem List   Diagnosis    Glaucoma    Type 2 diabetes mellitus without complication, with long-term current use of insulin    Hypertension    Hyperlipidemia    Fatty liver    Non-rheumatic mitral regurgitation    Venous insufficiency    Uterovaginal prolapse, complete    CAD (coronary artery disease)    Atrial fibrillation    Diastolic heart failure    Nodule of right lung    Pulmonary hypertension    History of COVID-19    Pre-op evaluation    Pulmonary edema, acute         How are you feeling? Daughter reports of patient to feel "good". She indicates of patient having weakness"    Does the patient understand why he/she was admitted to the hospital and what Dx is?:  Yes     Reviewed the After Visit Summary/Discharge instructions with patient:  Yes     Needs, concerns or barriers with completing discharge plan ?      All eligible TCM Diagnoses (include EF):    Current Vital signs:  Weight  BP  HR/Pulse  O2 sat        Medication Reconciliation--    Medication List:  Current Outpatient Medications   Medication Sig Dispense Refill    apixaban (ELIQUIS) 5 MG Take 1 tablet (5 mg total) by mouth every 12 (twelve) hours 180  tablet 0    atorvastatin (LIPITOR) 40 MG tablet Take 1 tablet by mouth every evening      Blood Glucose Monitoring Suppl (ACCU-CHEK AVIVA PLUS) w/Device Kit Check blood glucose level twice daily. ICD 10 Code E11.9, Z79.4 1 kit 1    bumetanide (BUMEX) 1 MG tablet Take 1 mg by mouth Once daily every Sunday, Tuesday, Thursday and Saturday evening      bumetanide (BUMEX) 1 MG tablet Take by mouth Once every Monday, Wednesday and Friday morning 2 mg MWF      Calcium Carbonate-Vitamin D (CALCIUM-D PO) Take 1 tablet by mouth daily.      empagliflozin (JARDIANCE) 10 MG tablet Take 1 tablet (10 mg) by mouth daily 30 tablet 3    glucose blood (Accu-Chek Aviva) test strip Check blood glucose level twice daily. ICD 10 Code E11.9, Z79.4 200 each 2    insulin NPH (NovoLIN N) 100 UNIT/ML injection INJECT 25 UNITS SUBCUTANEOUSLY WITH BREAKFST AND 10  UNITS WITH DINNER 60 mL 3    Insulin Syringe-Needle U-100 (BD INSULIN SYRINGE ULTRAFINE) 31G X 5/16"  1 ML Misc ADMINSTER INSULIN UNDER THE SKIN 2 TIMES A DAY 300 each 2    isosorbide mononitrate (IMDUR) 30 MG 24 hr tablet Take 1 tablet (30 mg) by mouth daily 30 tablet 3    lisinopril (ZESTRIL) 40 MG tablet TAKE ONE TABLET BY MOUTH DAILY (Patient taking differently: Take 40 mg by mouth daily Med was held in December 2022 for AKI) 90 tablet 1    metoprolol succinate XL (TOPROL-XL) 50 MG 24 hr tablet Take 1 tablet (50 mg) by mouth daily 30 tablet 3    Multiple Vitamins-Minerals (multivitamin with minerals) tablet Take 1 tablet by mouth daily      timolol (TIMOPTIC) 0.5 % ophthalmic solution Place 1 drop into both eyes daily       No current facility-administered medications for this visit.       CM reviewed all medications and provided education/teaching to patient on importance of reading and reviewing medication list:  :  Yes       Does the patient understand what all of his/her medications are for: Yes      Was the patient able to obtain all of his/her medications when they left the  hospital:  Yes     If NO- Are any medications cost prohibitive?  (be specifics which ones patient can and cannot afford)    How do you get your medications? Pick up, delivery, mail order?  Who picks up your medications for you? ( If not patient): Daughter picked up patient's medications    Plan for resolution of any medication concerns:    Physician Follow Up--    Was the patient scheduled/seen by PCP or cardiologist within critical timeframe (3-5 days) of hospital discharge: No, patient is scheduled to follow up with PCP on 3/27 and cardiologist on 3/31    Does patient understand importance of seeing a healthcare professional within critical timeframe of discharge: Yes    If appointment not scheduled within critical timeframe of discharge, what assistance offered to secure appointment:    Name/number of PCP: Dr. Mervin Kung, [phone]    Name/number of cardiologist:    Letter sent to PCP notifying of TCM involvement: No      Attempted outreach to PCP/Specialist to discuss patient care plan and updates: No       If yes, information discussed:     If uninsured and without a medical home, plan for placement to a permanent medical home:      Home Health Co-Management--    Is patient being followed by Home Health: No       If yes:    Name/number of Home Health agency:    Letter sent to Home Health agency notifying of TCM involvement: No      Summary of coordination/communication/Start of Care/Resumption of Care with Home Health agency staff:       CHF Management--    What is patient's understanding of CHF: CHF zones, fluid/sodium restriction, daily weight and cardiac healthy diet reviewed with patient's daughter.    Does the patient have a scale: Yes    If no scale, what is the plan:     Does the patient weigh himself/herself daily: Yes    Is the patient keeping a weight log: No    Does the patient understand (and/or were they educated on) the importance of weighing first thing in morning   after using bathroom with same  amount of clothes and doing this everyday?: yes      Does  the patient understand why "dry weight" is important:  No        Has the patient's MD told him/her what his/her "dry weight" should be: No        Was the patient educated on the CHF red/yellow zones?: Yes    GREEN ZONE:  ALL CLEAR: No sx of concern     YELLOW ZONE:  CAUTION  Description:  Weight gain of 2 pounds a day or 5 pounds a week  Increased swelling of your hands, abdomen, legs, or ankles  Increase in shortness of breath with activity  Increase in the number of pillows needed to sleep at night  New or more frequent chest pain or tightness  New onset of dizziness or lightheadedness after standing up    RED ZONE: EMERGENCY  Description   Unrelieved shortness of breath or shortness of breath at rest  Unrelieved chest pain  Wheezing or chest tightness at rest  Need to sit in a chair to sleep  Weight gain of more than 3 pounds a day or 5 pounds in a week  Confusion or fall related to dizziness or lightheadedness      Is the patient currently experiencing any of the CHF red flags/symptoms: No      Was the patient instructed on when to call physician and when to seek emergency care?Yes     Does patient meet EF requirements for cardiac rehab?  No      If yes, patient notified of cardiac rehab services and that a cardiac rehab team member will be calling patient with more information and date done:  No      CHF Self-Management/Health Maintenance--     Has the patient been instructed on low salt and/or low fat intake?   Yes    If on low salt diet, can patient describe how much is 2 grams of sodium and was patient instructed on high sodium foods to avoid (deli meats/cheeses, premade meals, sausage/bacon, canned soups, canned vegetables, etc)?:    Is the patient on fluid restriction?  No          How many ounces of fluid can the patient have a day:  :        BiopsychoSocial History/SDOH/Gaps in care/resources     SDOH reviewed by team member?   Yes       Goals:  What matters most to patient regarding their recovery and managing their health?  What goal can we work on together: Daughter wants to work collaboratively on helping the patient learn how to manage her CHF better.    Who do you live with? Relationship? Phone numbers if not listed in demographics? Patient lives with family    What DME do you have at home?(if listed in a CM note just confirm): None    Other DME needed or being delivered?    Is patient independent in ADL's/IADLs?  If not who helps?  How often? Patient is independent with activities of daily living    Who takes you to appointments? Patient is able to drive herself to appointments    How do you get groceries/ Who cooks at home/food insecurity? Patient is able to prepare her own foods    Do you have a copy of your Health Record at home? Active on Mychart? yes    Call Summary Notes--     Case manager completed initial assessment with patient's daughter. Miriam. Reason for calling and role explained to [place]. She provided the  best contact number, [phone], for the patient. In addition, Pieter PartridgeMiriam confirmed of patient obtaining all her medications and reviewing her discharge paperwork. She informed of patient being self-care with activities of daily living.    CHF education provided. Yellow/ed flag symptoms discussed with Pieter PartridgeMiriam. Case manager emphasized the importance of notifying the doctor or returning to the hospital for worsening symptoms. Monitoring of vital signs, following a sodium/fluid restriction and maintaining a cardiac healthy diet encouraged. Miriam to follow up with patient on keeping track of vital signs daily.    Pieter PartridgeMiriam reported of patient to feel "good" and lacking CHF symptoms of shortness of breath, swelling in her legs, wheezing, or chest discomfort.    Patient scheduled to follow up with PCP on 3/27 and cardiologist on 05/07/21. Pieter PartridgeMiriam will attempt to get an earlier appointment for the patient.    Case manager provided her contact  information to Hessie KnowsMiriam.    Armanii Urbanik N, LCSW, ACM-SW  Case Manager  [facility]  [facility]   Ambulatory Care Management  [address]  [address]  [place], [place] [id number]  T [phone]

## 2021-04-30 NOTE — Progress Notes (Signed)
Ambulatory Care Management     Case manager contacted patient's home using spanish interpreter [phone], but was unable reach her. The interpreter left a message in spanish explaining reason for calling.    Case manager will follow up again with patient and family next week.    Per chart review, patient scheduled to follow up with PCP on 05/03/21.      Arma Heading, ACM-SW  Case Manager  [facility]  [facility]  [address]  [address]  [place], [place] [address]  T [phone]

## 2021-05-03 ENCOUNTER — Ambulatory Visit (INDEPENDENT_AMBULATORY_CARE_PROVIDER_SITE_OTHER): Payer: Medicare PPO | Admitting: Internal Medicine

## 2021-05-03 ENCOUNTER — Encounter (INDEPENDENT_AMBULATORY_CARE_PROVIDER_SITE_OTHER): Payer: Self-pay | Admitting: Internal Medicine

## 2021-05-03 VITALS — BP 159/89 | HR 82 | Temp 97.9°F | Wt 177.0 lb

## 2021-05-03 DIAGNOSIS — E669 Obesity, unspecified: Secondary | ICD-10-CM | POA: Insufficient documentation

## 2021-05-03 DIAGNOSIS — Z1231 Encounter for screening mammogram for malignant neoplasm of breast: Secondary | ICD-10-CM

## 2021-05-03 DIAGNOSIS — N1832 Chronic kidney disease, stage 3b: Secondary | ICD-10-CM

## 2021-05-03 DIAGNOSIS — Z23 Encounter for immunization: Secondary | ICD-10-CM

## 2021-05-03 DIAGNOSIS — I10 Essential (primary) hypertension: Secondary | ICD-10-CM

## 2021-05-03 DIAGNOSIS — Z09 Encounter for follow-up examination after completed treatment for conditions other than malignant neoplasm: Secondary | ICD-10-CM

## 2021-05-03 DIAGNOSIS — I5030 Unspecified diastolic (congestive) heart failure: Secondary | ICD-10-CM

## 2021-05-03 DIAGNOSIS — E1169 Type 2 diabetes mellitus with other specified complication: Secondary | ICD-10-CM

## 2021-05-03 MED ORDER — INSULIN SYRINGE-NEEDLE U-100 31G X 5/16" 1 ML MISC
2 refills | Status: DC
Start: 2021-05-03 — End: 2021-11-19

## 2021-05-03 MED ORDER — GLUCOSE BLOOD VI STRP
ORAL_STRIP | 2 refills | Status: DC
Start: 2021-05-03 — End: 2021-11-19

## 2021-05-03 MED ORDER — NOVOLIN N 100 UNIT/ML SC SUSP
SUBCUTANEOUS | 3 refills | Status: DC
Start: 2021-05-03 — End: 2021-11-19

## 2021-05-03 MED ORDER — HYDRALAZINE HCL 25 MG PO TABS
25.0000 mg | ORAL_TABLET | Freq: Three times a day (TID) | ORAL | 0 refills | Status: DC
Start: 2021-05-03 — End: 2021-07-20

## 2021-05-03 MED ORDER — METOPROLOL SUCCINATE ER 50 MG PO TB24
50.0000 mg | ORAL_TABLET | Freq: Every day | ORAL | 1 refills | Status: DC
Start: 2021-05-03 — End: 2021-07-20

## 2021-05-03 MED ORDER — EMPAGLIFLOZIN 25 MG PO TABS
25.0000 mg | ORAL_TABLET | Freq: Every morning | ORAL | 0 refills | Status: DC
Start: 2021-05-03 — End: 2021-08-03

## 2021-05-03 NOTE — Progress Notes (Signed)
Subjective:      Patient ID: [name] is a 66 y.o. female.    Chief Complaint:  Chief Complaint   Patient presents with    Hospital Follow-up       HPI:  HPI    66 y/o F with HTN, DM, Diastolic CHF, CKD 3b among others here for hospital discharge follow up    [employer] Spanish interpreter used    From 3/14-3/16/2023  Admission Diagnosis:   Pulmonary edema, acute     Discharge Diagnosis:       Active Hospital Problems     Diagnosis    Pulmonary edema, acute   Acute CHF exacerbation preserved EF  Acute kidney injury  Admission Condition: Guarded  Discharge Condition: Stable  Consultants: None  Functional Status: Independent  Discharged to: Home    Presentation History      For complete details please see H&P and progress notes in epic computer system.     Please see the below HPI taken from the history and physical     [name] is a 66 y.o. female with a PMHx of essential hypertension, dyslipidemia, type 2 diabetes mellitus, coronary artery disease, paroxysmal A-fib, class I obesity, glaucoma and history of CHF with diastolic dysfunction, who presented with progressive shortness of breath for last 3 to 4 days.  Patient does have exertional dyspnea, orthopnea and paroxysmal nocturnal dyspnea.  Patient denies any significant lower extremity swelling or weight gain.  No chest pain, heart palpitation, fever or chills.  Patient also denies any nausea, vomiting, abdominal pain, dysuria or hematuria.  In the emergency room patient was noted to have elevated BNP, 2375.  Slightly elevated troponin and chest x-ray is consistent with pulmonary congestion and edema.  Patient is being admitted for further management of acute on chronic CHF with diastolic dysfunction.     Hospital Course (2 Days)      The patient was admitted into the hospital and diuresed with IV diuretics 40 mg of IV Lasix.  Discontinued through 3/15.  With true clinical improvement on 3/16.  The patient is making urine without any issue.  Her  baseline creatinine is 1.0.  She appears to have a slight rise in her creatinine to 1.4.  She is making urine without any issue.        At present point in time she can be discharged from the hospital would be hesitant to reload her with IV fluid her creatinine was checked again later in the day and is stable at 1.4 not getting any worse.  Again she is urinating without issue.  Would recommend having blood work checked in the a.m. as an outpatient and calling her ordering provider for her Bumex instructions.  If she cannot get in touch with her provider I would recommend a 48-hour diuretic holiday prior to resumption of her outpatient diuretic regimen 3/19.  In addition the patient has been started on Jardiance to help reduce her A1c and prevent readmissions for heart failure.  A 2D echo was performed that showed evidence of a preserved ejection fraction with no wall motion abnormalities.     Acute on chronic CHF with diastolic dysfunction:  Repeat 2D echo shows normal EF with normal pulmonary\pressures  Resume home medications  Diuretic holiday until 3/19  Needs blood work 3/17 to check renal function she is to call her ordering provider for further diuretic management        Acute kidney injury  Patient is nonoliguric making  urine without issue  Diuretic holiday minimum 48 hours  Outpatient blood work in a.m. to see that creatinine is either stable and/or improving  Will need further monitoring until full resolution     Hypertensive urgency:  Resolved  Lisinopril 40 mg daily  Imdur 30 mg daily  Toprol 50 mg daily        Type 2 diabetes mellitus:  -Uncontrolled with hyperglycemia.  Started on Jardiance 10 mg daily  NPH 25 units in a.m. 10 units in the evening to start  We will have to follow-up with PCP for this to be uptitrated       Intermi Hx  She is doing well symptom wise    She is still not able to undergo surgery for the uterovaginal prolapse repair due to uncontrolled HTN  She is on Lisinopril 40 mg    Isosorbide mononitrate 30 mg daily  Bumex 1 mg daily  Metoprolol XL 25 mg daily  Home BP runs very high in the 150-160  Today we discussed about increasing metoprolol to 50 mg daily, add hydralazine 25 TID  We will check BMP and if continues to decline, we will d/c lisinopril      DM  NPH 52 U at night; however her morning numbers are always low so she does not give herself  the morning insulin and ends up having high BS numbers at night in he 200-300s  Today we discussed about adjusting insulin to 40U PM, 20U AM  Increase Jardiance 10 mg d (new medication started in the hospital)-->25 mg daily    CKD 3b  Pt has not been established with Nephrologist  Will give referral today           Problem List:  Patient Active Problem List   Diagnosis    Glaucoma    Type 2 diabetes mellitus without complication, with long-term current use of insulin    Hypertension    Hyperlipidemia    Fatty liver    Non-rheumatic mitral regurgitation    Venous insufficiency    Uterovaginal prolapse, complete    CAD (coronary artery disease)    Atrial fibrillation    Diastolic heart failure    Nodule of right lung    Pulmonary hypertension    History of COVID-19    Pre-op evaluation    Pulmonary edema, acute    Diabetes mellitus type 2 in obese       Current Medications:  Current Outpatient Medications   Medication Sig Dispense Refill    apixaban (ELIQUIS) 5 MG Take 1 tablet (5 mg total) by mouth every 12 (twelve) hours 180 tablet 0    atorvastatin (LIPITOR) 40 MG tablet Take 1 tablet by mouth every evening      Blood Glucose Monitoring Suppl (ACCU-CHEK AVIVA PLUS) w/Device Kit Check blood glucose level twice daily. ICD 10 Code E11.9, Z79.4 1 kit 1    bumetanide (BUMEX) 1 MG tablet Take 1 mg by mouth Once daily every Sunday, Tuesday, Thursday and Saturday evening      Calcium Carbonate-Vitamin D (CALCIUM-D PO) Take 1 tablet by mouth daily.      lisinopril (ZESTRIL) 40 MG tablet TAKE ONE TABLET BY MOUTH DAILY (Patient taking differently: Take  40 mg by mouth daily Med was held in December 2022 for AKI) 90 tablet 1    Multiple Vitamins-Minerals (multivitamin with minerals) tablet Take 1 tablet by mouth daily      timolol (TIMOPTIC) 0.5 % ophthalmic solution Place 1 drop into  both eyes daily      bumetanide (BUMEX) 1 MG tablet Take by mouth Once every Monday, Wednesday and Friday morning 2 mg MWF (Patient not taking: Reported on 05/03/2021)      empagliflozin (Jardiance) 25 MG tablet Take 1 tablet (25 mg) by mouth every morning 90 tablet 0    glucose blood (Accu-Chek Aviva) test strip Check blood glucose level twice daily. ICD 10 Code E11.9, Z79.4 200 each 2    hydrALAZINE (APRESOLINE) 25 MG tablet Take 1 tablet (25 mg) by mouth 3 (three) times daily 270 tablet 0    insulin NPH (NovoLIN N) 100 UNIT/ML injection INJECT 20 UNITS SUBCUTANEOUSLY WITH BREAKFST AND 40  UNITS WITH DINNER 60 mL 3    Insulin Syringe-Needle U-100 (BD INSULIN SYRINGE ULTRAFINE) 31G X 5/16" 1 ML Misc ADMINSTER INSULIN UNDER THE SKIN 2 TIMES A DAY 300 each 2    isosorbide mononitrate (IMDUR) 30 MG 24 hr tablet Take 1 tablet (30 mg) by mouth daily (Patient not taking: Reported on 05/03/2021) 30 tablet 3    metoprolol succinate XL (TOPROL-XL) 50 MG 24 hr tablet Take 1 tablet (50 mg) by mouth daily 90 tablet 1     No current facility-administered medications for this visit.       Allergies:  Allergies   Allergen Reactions    Shellfish-Derived Products        Past Medical History:  Past Medical History:   Diagnosis Date    Congenital anomaly of heart     Diabetes mellitus type 2 in obese     Diabetic retinopathy 06/28/2013    Of the right eye, per patient    Glaucoma 06/01/2012    Hyperlipidemia     Hypertension     Obesity     Venous insufficiency 09/17/2014    Followed at [facility].  Wears compression stockings.        Past Surgical History:  Past Surgical History:   Procedure Laterality Date    BREAST CYST EXCISION  1993    bilateral    TUBAL LIGATION         Family  History:  Family History   Problem Relation Age of Onset    Heart disease Mother     Diabetes Mother     Hypertension Mother     Hyperlipidemia Mother     Heart attack Mother     Heart disease Father     Diabetes Father     Hypertension Father     Hyperlipidemia Father     Cirrhosis Father     Diabetes Sister     Liver cancer Maternal Aunt     Cancer Maternal Uncle         liver       Social History:  Social History     Socioeconomic History    Marital status: Single    Number of children: 3   Occupational History    Occupation: Maintenance     Employer: Civil engineer, contracting   Tobacco Use    Smoking status: Never    Smokeless tobacco: Never   Vaping Use    Vaping status: Never Used   Substance and Sexual Activity    Alcohol use: No    Drug use: No    Sexual activity: Not Currently   Social History Narrative    Originally from [place].  Lives with her daughter.    Exercise: walks frequently     Social Determinants of Health  Financial Resource Strain: Medium Risk (08/03/2020)    Overall Financial Resource Strain (CARDIA)     Difficulty of Paying Living Expenses: Somewhat hard   Food Insecurity: Unknown (08/03/2020)    Hunger Vital Sign     Worried About Running Out of Food in the Last Year: Patient refused     Ran Out of Food in the Last Year: Patient refused   Transportation Needs: No Transportation Needs (08/03/2020)    PRAPARE - Therapist, art (Medical): No     Lack of Transportation (Non-Medical): No   Physical Activity: Sufficiently Active (08/03/2020)    Exercise Vital Sign     Days of Exercise per Week: 2 days     Minutes of Exercise per Session: 110 min   Stress: No Stress Concern Present (08/03/2020)    [employer] of Occupational Health - Occupational Stress Questionnaire     Feeling of Stress : Only a little   Social Connections: Socially Isolated (08/03/2020)    Social Connection and Isolation Panel [NHANES]     Frequency of Communication with Friends and Family: More than three times a  week     Frequency of Social Gatherings with Friends and Family: Patient refused     Attends Religious Services: Never     Database administrator or Organizations: No     Attends Banker Meetings: Never     Marital Status: Never married   Intimate Partner Violence: Not At Risk (08/03/2020)    Humiliation, Afraid, Rape, and Kick questionnaire     Fear of Current or Ex-Partner: No     Emotionally Abused: No     Physically Abused: No     Sexually Abused: No   Housing Stability: Low Risk  (08/03/2020)    Housing Stability Vital Sign     Unable to Pay for Housing in the Last Year: No     Number of Places Lived in the Last Year: 1     Unstable Housing in the Last Year: No        The following sections were reviewed this encounter by the provider:   Tobacco  Allergies  Meds  Problems  Med Hx  Surg Hx  Fam Hx         ROS:  Review of Systems   Constitutional:  Negative for activity change, appetite change, chills and fever.   Eyes:  Negative for visual disturbance.   Respiratory:  Negative for cough, chest tightness, shortness of breath and wheezing.    Cardiovascular:  Negative for chest pain, palpitations and leg swelling.   Gastrointestinal:  Negative for abdominal pain, constipation, diarrhea, nausea and vomiting.   Endocrine: Negative for polydipsia, polyphagia and polyuria.   Genitourinary:  Negative for dysuria.   Musculoskeletal:  Negative for back pain, gait problem and myalgias.   Skin:  Negative for rash.   Neurological:  Negative for dizziness.   Hematological:  Negative for adenopathy.   Psychiatric/Behavioral:  Negative for agitation.        Vitals:  BP 159/89   Pulse 82   Temp 97.9 F (36.6 C)   Wt 80.3 kg (177 lb)   SpO2 97%   BMI 28.57 kg/m      Objective:     Physical Exam:  Physical Exam  Constitutional:       General: She is not in acute distress.     Appearance: Normal appearance. She is normal weight. She is not ill-appearing  or toxic-appearing.   HENT:      Head: Normocephalic  and atraumatic.   Cardiovascular:      Rate and Rhythm: Normal rate and regular rhythm.      Heart sounds: Normal heart sounds. No murmur heard.     No gallop.   Pulmonary:      Effort: Pulmonary effort is normal. No respiratory distress.      Breath sounds: Normal breath sounds. No stridor. No wheezing.   Abdominal:      General: Abdomen is flat. Bowel sounds are normal. There is no distension.      Palpations: Abdomen is soft.   Musculoskeletal:         General: Normal range of motion.   Skin:     General: Skin is warm and dry.      Capillary Refill: Capillary refill takes less than 2 seconds.   Neurological:      General: No focal deficit present.      Mental Status: She is alert and oriented to person, place, and time. Mental status is at baseline.   Psychiatric:         Mood and Affect: Mood normal.         Behavior: Behavior normal.         Thought Content: Thought content normal.          Assessment:     1. Stage 3b chronic kidney disease  - Ambulatory referral to Nephrology  - Basic Metabolic Panel    2. Diabetes mellitus type 2 in obese  - empagliflozin (Jardiance) 25 MG tablet; Take 1 tablet (25 mg) by mouth every morning  Dispense: 90 tablet; Refill: 0  - insulin NPH (NovoLIN N) 100 UNIT/ML injection; INJECT 20 UNITS SUBCUTANEOUSLY WITH BREAKFST AND 40  UNITS WITH DINNER  Dispense: 60 mL; Refill: 3  - glucose blood (Accu-Chek Aviva) test strip; Check blood glucose level twice daily. ICD 10 Code E11.9, Z79.4  Dispense: 200 each; Refill: 2  - Insulin Syringe-Needle U-100 (BD INSULIN SYRINGE ULTRAFINE) 31G X 5/16" 1 ML Misc; ADMINSTER INSULIN UNDER THE SKIN 2 TIMES A DAY  Dispense: 300 each; Refill: 2    3. Diastolic heart failure, unspecified HF chronicity  - empagliflozin (Jardiance) 25 MG tablet; Take 1 tablet (25 mg) by mouth every morning  Dispense: 90 tablet; Refill: 0    4. Primary hypertension  - metoprolol succinate XL (TOPROL-XL) 50 MG 24 hr tablet; Take 1 tablet (50 mg) by mouth daily  Dispense:  90 tablet; Refill: 1  - hydrALAZINE (APRESOLINE) 25 MG tablet; Take 1 tablet (25 mg) by mouth 3 (three) times daily  Dispense: 270 tablet; Refill: 0    5. Encounter for screening mammogram for malignant neoplasm of breast  - Mammo Screening 3D/Tomo Bilateral; Future    6. Need for vaccination  - Pneumococcal Conjugate 20 - Valent    7. Hospital discharge follow-up  Plan included in HPI    Plan:     Plan as above.      Melvenia Beam, MD

## 2021-05-04 LAB — BASIC METABOLIC PANEL
BUN / Creatinine Ratio: 20 (ref 12–28)
BUN: 30 mg/dL — ABNORMAL HIGH (ref 8–27)
CO2: 24 mmol/L (ref 20–29)
Calcium: 9.6 mg/dL (ref 8.7–10.3)
Chloride: 101 mmol/L (ref 96–106)
Creatinine: 1.5 mg/dL — ABNORMAL HIGH (ref 0.57–1.00)
Glucose: 275 mg/dL — ABNORMAL HIGH (ref 70–99)
Potassium: 4.9 mmol/L (ref 3.5–5.2)
Sodium: 141 mmol/L (ref 134–144)
eGFR: 38 mL/min/{1.73_m2} — ABNORMAL LOW (ref >59)

## 2021-05-06 ENCOUNTER — Encounter (INDEPENDENT_AMBULATORY_CARE_PROVIDER_SITE_OTHER): Payer: Self-pay | Admitting: Internal Medicine

## 2021-05-07 ENCOUNTER — Encounter (INDEPENDENT_AMBULATORY_CARE_PROVIDER_SITE_OTHER): Payer: Self-pay | Admitting: Internal Medicine

## 2021-05-14 NOTE — Progress Notes (Signed)
Ambulatory Care Management- Continued CHF follow up.      How are you feeling? "Im feeling great    Are you having any medical concerns or questions at this time?     Current Vital signs:  Weight: 179 lbs   BP  HR/Pulse  O2 sat      CHF Management-Teachback--    Does the patient understand what could make their heart failure worse (diet, liquids, activity, weight, smoking, infections): Yes    Can the patient identify/teach back yellow/red zone symptoms? Yes     Can the patient identify what to do if he/she experiences these symptoms: Yes      Physician Follow Up/Medication Follow Up--    Patient seen by PCP or cardiologist within critical timeframe( 3-5 days) of hospital discharge (please list date seen and by whom):     If no, plan for patient to be seen by PCP or cardiologist:    Were any medications changed at follow up medical appointments:  Yes    Attempted outreach to PCP/Specialist to discuss patient care plan and updates: No       If yes, information discussed:     If uninsured and without a medical home, follow up on plan for placement to a permanent medical home:      Depression Screen--    Over the past 4 weeks, has the patient felt down, depressed or hopeless:  No      Over the past 4 weeks, has the patients felt little interest or pleasure in doing things:  No      What resource(s) provided if positive response to one or both of the depression screen questions:    Goals: how is patient progressing on goals/What matters most to them? Patient does not have any shortness of breath, chest discomfort or swelling    Follow up on any barriers/SDOH/needs from previous call:      Call Summary Notes--     CM completed week 2 follow up with patient using spanish interpreter [phone]. Patient reported of feeling "great". Denied having any shortness of breath, chest discomfort, wheezing or swelling. In addition, patient has completed follow up with PCP on 3/27 and cardiologist 3/31.  Patient reported of monitoring her  vital signs (BP, weight) regularly. She informed of working on her nutrition by minimizing amount of sodium consumption. Lastly, patient denies having any depressive symptoms.    Arma Heading, ACM-SW  Case Manager  [facility]  [facility]  [address]  [address]  [place], [place] [address]  T [phone]

## 2021-05-14 NOTE — Progress Notes (Signed)
Ambulatory Care Management     CM contacted patient using spanish interpreter but was unable to reach her.    Arma Heading, ACM-SW  Case Manager  [facility]  [facility]  [address]  [address]  [place], [place] [address]  T [phone]

## 2021-05-21 NOTE — Progress Notes (Signed)
Ambulatory Care Management     Case manager contacted patient's home for final call. Patient was available for brief phone call.    Patient denied having any weight gain, shortness of breath, or swelling.    Patient reported of feeling "good".     Patient informed that this would be a final call.    Patient completed 30 days within Ambulatory care management program and will be discharged.    Arma Heading, ACM-SW  Case Manager  [facility]  [facility]  [address]  [address]  [place], [place] [address]  T [phone]

## 2021-06-01 ENCOUNTER — Encounter (INDEPENDENT_AMBULATORY_CARE_PROVIDER_SITE_OTHER): Payer: Self-pay | Admitting: Internal Medicine

## 2021-06-02 ENCOUNTER — Encounter (INDEPENDENT_AMBULATORY_CARE_PROVIDER_SITE_OTHER): Payer: Self-pay

## 2021-06-02 DIAGNOSIS — E113399 Type 2 diabetes mellitus with moderate nonproliferative diabetic retinopathy without macular edema, unspecified eye: Secondary | ICD-10-CM | POA: Insufficient documentation

## 2021-06-10 ENCOUNTER — Other Ambulatory Visit (FREE_STANDING_LABORATORY_FACILITY): Payer: PRIVATE HEALTH INSURANCE

## 2021-06-10 DIAGNOSIS — N289 Disorder of kidney and ureter, unspecified: Secondary | ICD-10-CM

## 2021-06-10 LAB — COMPREHENSIVE METABOLIC PANEL
ALT: 22 U/L (ref 0–55)
AST (SGOT): 26 U/L (ref 5–41)
Albumin/Globulin Ratio: 1 (ref 0.9–2.2)
Albumin: 3.7 g/dL (ref 3.5–5.0)
Alkaline Phosphatase: 143 U/L — ABNORMAL HIGH (ref 37–117)
Anion Gap: 8 (ref 5.0–15.0)
BUN: 20 mg/dL (ref 7.0–21.0)
Bilirubin, Total: 0.5 mg/dL (ref 0.2–1.2)
CO2: 30 mEq/L — ABNORMAL HIGH (ref 17–29)
Calcium: 9.2 mg/dL (ref 8.5–10.5)
Chloride: 104 mEq/L (ref 99–111)
Creatinine: 1.1 mg/dL — ABNORMAL HIGH (ref 0.4–1.0)
Globulin: 3.8 g/dL — ABNORMAL HIGH (ref 2.0–3.6)
Glucose: 95 mg/dL (ref 70–100)
Potassium: 5 mEq/L (ref 3.5–5.3)
Protein, Total: 7.5 g/dL (ref 6.0–8.3)
Sodium: 142 mEq/L (ref 135–145)

## 2021-06-10 LAB — GFR: EGFR: 49.7

## 2021-06-10 LAB — LIPID PANEL
Cholesterol / HDL Ratio: 3.8 Index
Cholesterol: 172 mg/dL (ref 0–199)
HDL: 45 mg/dL (ref 40–9999)
LDL Calculated: 92 mg/dL (ref 0–99)
Triglycerides: 176 mg/dL — ABNORMAL HIGH (ref 34–149)
VLDL Calculated: 35 mg/dL (ref 10–40)

## 2021-06-10 LAB — CK: Creatine Kinase (CK): 111 U/L (ref 29–233)

## 2021-06-10 LAB — HEMOLYSIS INDEX: Hemolysis Index: 9 Index (ref 0–24)

## 2021-06-14 ENCOUNTER — Encounter (INDEPENDENT_AMBULATORY_CARE_PROVIDER_SITE_OTHER): Payer: Self-pay | Admitting: Internal Medicine

## 2021-06-28 ENCOUNTER — Ambulatory Visit: Payer: Medicare PPO | Attending: Internal Medicine

## 2021-06-28 DIAGNOSIS — Z1231 Encounter for screening mammogram for malignant neoplasm of breast: Secondary | ICD-10-CM | POA: Insufficient documentation

## 2021-07-17 ENCOUNTER — Other Ambulatory Visit (INDEPENDENT_AMBULATORY_CARE_PROVIDER_SITE_OTHER): Payer: Self-pay | Admitting: Internal Medicine

## 2021-07-17 DIAGNOSIS — I480 Paroxysmal atrial fibrillation: Secondary | ICD-10-CM

## 2021-07-19 NOTE — Progress Notes (Unsigned)
Date of Exam: 07/20/2021 7:09 PM        Patient ID: [name] is a 66 y.o. female.  Provider: Quay Burow, DNP FNP        Chief Complaint:    No chief complaint on file.              HPI:    HPI  Due to language barrier, an interpreter was present during the history-taking and subsequent discussion (and for part of the physical exam) with this patient. MRN #    66 y.o. female presents c/o cough and elevated BP.    Reports tested negative for COVID on    Hypertension  Reports taking Imdur 30 mg once daily, Bumex 1 mg once daily, Hydralazine 25 mg three times daily, Metoprolol 50 mg once daily, and Lisinopril 40 mg once daily. Reports BP range at home has been .  Denies CP, SOB, HA, dizziness, light headedness, visual disturbances, or extremity swelling.            Problem List:    Patient Active Problem List   Diagnosis    Glaucoma    Type 2 diabetes mellitus without complication, with long-term current use of insulin    Hypertension    Hyperlipidemia    Fatty liver    Non-rheumatic mitral regurgitation    Venous insufficiency    Uterovaginal prolapse, complete    CAD (coronary artery disease)    Atrial fibrillation    Diastolic heart failure    Nodule of right lung    Pulmonary hypertension    History of COVID-19    Pulmonary edema, acute    Diabetes mellitus type 2 in obese    Moderate nonproliferative diabetic retinopathy             Current Meds:    No outpatient medications have been marked as taking for the 07/20/21 encounter (Appointment) with Quay Burow, DNP FNP.          Allergies:    Allergies   Allergen Reactions    Shellfish-Derived Products              Past Surgical History:    Past Surgical History:   Procedure Laterality Date    BREAST CYST EXCISION  1993    bilateral    TUBAL LIGATION             Family History:    Family History   Problem Relation Age of Onset    Heart disease Mother     Diabetes Mother     Hypertension Mother     Hyperlipidemia Mother     Heart attack Mother      Heart disease Father     Diabetes Father     Hypertension Father     Hyperlipidemia Father     Cirrhosis Father     Diabetes Sister     Liver cancer Maternal Aunt     Cancer Maternal Uncle         liver           Social History:    Social History     Tobacco Use    Smoking status: Never    Smokeless tobacco: Never   Vaping Use    Vaping status: Never Used   Substance Use Topics    Alcohol use: No    Drug use: No           The following sections were reviewed this encounter by the  provider:            Vital Signs:    There were no vitals taken for this visit.         ROS:    Review of Systems   Constitutional: Negative.    Eyes:  Negative for pain and visual disturbance.   Respiratory:  Positive for cough. Negative for chest tightness, shortness of breath and wheezing.    Cardiovascular:  Negative for chest pain, palpitations and leg swelling.   Gastrointestinal:  Negative for abdominal pain, constipation, diarrhea, nausea and vomiting.   Endocrine: Negative for polydipsia, polyphagia and polyuria.   Musculoskeletal:  Negative for back pain, gait problem and myalgias.   Skin:  Negative for rash.   Neurological:  Negative for dizziness, weakness, light-headedness, numbness and headaches.   Hematological:  Negative for adenopathy.   Psychiatric/Behavioral:  Negative for agitation.                 Physical Exam:    Physical Exam  Constitutional:       General: She is not in acute distress.     Appearance: Normal appearance. She is normal weight. She is not ill-appearing or toxic-appearing.   HENT:      Head: Normocephalic and atraumatic.   Eyes:      Extraocular Movements: Extraocular movements intact.      Conjunctiva/sclera: Conjunctivae normal.   Cardiovascular:      Rate and Rhythm: Normal rate and regular rhythm.      Pulses: Normal pulses.      Heart sounds: Normal heart sounds. No murmur heard.     No gallop.   Pulmonary:      Effort: Pulmonary effort is normal. No respiratory distress.      Breath sounds: Normal  breath sounds. No stridor. No wheezing.   Abdominal:      General: Abdomen is flat. Bowel sounds are normal. There is no distension.      Palpations: Abdomen is soft.   Musculoskeletal:         General: Normal range of motion.      Cervical back: Normal range of motion and neck supple.   Skin:     General: Skin is warm and dry.      Capillary Refill: Capillary refill takes less than 2 seconds.   Neurological:      General: No focal deficit present.      Mental Status: She is alert and oriented to person, place, and time. Mental status is at baseline.   Psychiatric:         Mood and Affect: Mood normal.         Thought Content: Thought content normal.                Assessment/Plan:    There are no diagnoses linked to this encounter.      Reviewed treatment plan inclusive of risks and benefits. Patient agreeable with plan, all questions answered. ED precautions reviewed; patient verbalized understanding.            Follow-up:    No follow-ups on file.             Quay Burow, DNP FNP

## 2021-07-20 ENCOUNTER — Encounter (INDEPENDENT_AMBULATORY_CARE_PROVIDER_SITE_OTHER): Payer: Self-pay

## 2021-07-20 ENCOUNTER — Ambulatory Visit (INDEPENDENT_AMBULATORY_CARE_PROVIDER_SITE_OTHER): Payer: Medicare PPO

## 2021-07-20 VITALS — BP 157/87 | HR 86 | Temp 98.0°F | Resp 16 | Wt 180.6 lb

## 2021-07-20 DIAGNOSIS — I10 Essential (primary) hypertension: Secondary | ICD-10-CM

## 2021-07-20 DIAGNOSIS — J069 Acute upper respiratory infection, unspecified: Secondary | ICD-10-CM

## 2021-07-20 DIAGNOSIS — J029 Acute pharyngitis, unspecified: Secondary | ICD-10-CM

## 2021-07-20 LAB — POCT RAPID STREP A: Rapid Strep A Screen POCT: NEGATIVE

## 2021-07-20 LAB — IHS AMB POCT SOFIA (TM) SARS CORONAVIRUS ANTIGEN FIA: Sofia SARS COV2 Antigen POCT: NEGATIVE

## 2021-07-20 MED ORDER — ALBUTEROL SULFATE HFA 108 (90 BASE) MCG/ACT IN AERS
2.0000 | INHALATION_SPRAY | RESPIRATORY_TRACT | 0 refills | Status: AC | PRN
Start: 2021-07-20 — End: 2022-07-20

## 2021-07-20 MED ORDER — PREDNISONE 10 MG PO TABS
10.0000 mg | ORAL_TABLET | Freq: Every day | ORAL | 0 refills | Status: DC
Start: 2021-07-20 — End: 2021-08-13

## 2021-07-20 MED ORDER — AZITHROMYCIN 250 MG PO TABS
ORAL_TABLET | ORAL | 0 refills | Status: AC
Start: 2021-07-20 — End: 2021-07-26

## 2021-07-20 NOTE — Progress Notes (Deleted)
Have you seen any specialists/other providers since your last visit with [place]?    Yes - Cardiologist    Do you agree to telemedicine visit?  Yes    Arm preference verified?   No    Are you going to be on the state of [place] during this telemedicine appointment?  {Yes/no}

## 2021-07-22 ENCOUNTER — Encounter (INDEPENDENT_AMBULATORY_CARE_PROVIDER_SITE_OTHER): Payer: Self-pay | Admitting: Internal Medicine

## 2021-07-31 ENCOUNTER — Other Ambulatory Visit (INDEPENDENT_AMBULATORY_CARE_PROVIDER_SITE_OTHER): Payer: Self-pay | Admitting: Internal Medicine

## 2021-07-31 DIAGNOSIS — E669 Obesity, unspecified: Secondary | ICD-10-CM

## 2021-07-31 DIAGNOSIS — I10 Essential (primary) hypertension: Secondary | ICD-10-CM

## 2021-07-31 DIAGNOSIS — I5030 Unspecified diastolic (congestive) heart failure: Secondary | ICD-10-CM

## 2021-08-13 ENCOUNTER — Encounter (INDEPENDENT_AMBULATORY_CARE_PROVIDER_SITE_OTHER): Payer: Self-pay | Admitting: Internal Medicine

## 2021-08-13 ENCOUNTER — Ambulatory Visit (INDEPENDENT_AMBULATORY_CARE_PROVIDER_SITE_OTHER): Payer: Medicare PPO | Admitting: Internal Medicine

## 2021-08-13 VITALS — BP 163/89 | HR 74 | Temp 98.2°F | Resp 20 | Ht 65.0 in | Wt 175.0 lb

## 2021-08-13 DIAGNOSIS — Z780 Asymptomatic menopausal state: Secondary | ICD-10-CM

## 2021-08-13 DIAGNOSIS — E1169 Type 2 diabetes mellitus with other specified complication: Secondary | ICD-10-CM

## 2021-08-13 DIAGNOSIS — I480 Paroxysmal atrial fibrillation: Secondary | ICD-10-CM

## 2021-08-13 DIAGNOSIS — E7800 Pure hypercholesterolemia, unspecified: Secondary | ICD-10-CM

## 2021-08-13 DIAGNOSIS — E669 Obesity, unspecified: Secondary | ICD-10-CM

## 2021-08-13 DIAGNOSIS — I10 Essential (primary) hypertension: Secondary | ICD-10-CM

## 2021-08-13 DIAGNOSIS — J810 Acute pulmonary edema: Secondary | ICD-10-CM

## 2021-08-13 MED ORDER — CARVEDILOL 25 MG PO TABS
25.0000 mg | ORAL_TABLET | Freq: Two times a day (BID) | ORAL | 1 refills | Status: DC
Start: 2021-08-13 — End: 2021-11-19

## 2021-08-13 MED ORDER — ISOSORBIDE MONONITRATE ER 30 MG PO TB24
30.0000 mg | ORAL_TABLET | Freq: Every day | ORAL | 3 refills | Status: DC
Start: 2021-08-13 — End: 2021-11-19

## 2021-08-13 NOTE — Progress Notes (Unsigned)
[employer] interpreter # [phone] spanish.    Stated that medications jardiance and Eliquis is too expensive. Asking for another alternative.     Have you seen any specialists since your last visit with [place]?  no      The patient was informed that the following HM items are still outstanding:   pap smear and shingles vaccine, advance directive, covid vaccine, medicare wellness, DXA scan.

## 2021-08-13 NOTE — Progress Notes (Signed)
[facility] VISIT    {Vanishing Tip Click a link below to be taken to that activity or part of the chart   Chart Review  Order Review  Review Flowsheets  Labs  Health Maintenance  Immunizations  Allergies  Medications  Problem List  History :[id number]}       [name]  is a 66 y.o.  female.  HPI  ***    66 y/o F with HTN, DM, Diastolic CHF, CKD 3b among others here for hospital discharge follow up     [employer] Spanish interpreter used    HTN  She is on Lisinopril 40 mg   Isosorbide mononitrate 30 mg daily  Bumex 1 mg daily  Carvedilol 6.25 mg BID -->25 mg BID  Home BP runs very high in the 150-160          DM   On insulin 30U PM, 20U AM  Cant afford Jardiance anymore so she would like me to change it to something ch     CKD 3b  Pt has not been established with Nephrologist  Will give referral today       Review of Systems     BP 163/89 (BP Site: Right arm, Patient Position: Sitting, Cuff Size: Medium)   Pulse 74   Temp 98.2 F (36.8 C) (Temporal)   Resp 20   Ht 1.651 m (5\' 5" )   Wt 79.4 kg (175 lb)   BMI 29.12 kg/m    Physical Exam     There are no diagnoses linked to this encounter.  ***  No follow-ups on file.

## 2021-08-14 ENCOUNTER — Encounter (INDEPENDENT_AMBULATORY_CARE_PROVIDER_SITE_OTHER): Payer: Self-pay

## 2021-08-15 ENCOUNTER — Encounter (INDEPENDENT_AMBULATORY_CARE_PROVIDER_SITE_OTHER): Payer: Self-pay | Admitting: Internal Medicine

## 2021-08-18 ENCOUNTER — Encounter (INDEPENDENT_AMBULATORY_CARE_PROVIDER_SITE_OTHER): Payer: Self-pay | Admitting: Internal Medicine

## 2021-08-23 ENCOUNTER — Other Ambulatory Visit (FREE_STANDING_LABORATORY_FACILITY): Payer: Medicare PPO

## 2021-08-23 ENCOUNTER — Other Ambulatory Visit (INDEPENDENT_AMBULATORY_CARE_PROVIDER_SITE_OTHER): Payer: Self-pay | Admitting: Internal Medicine

## 2021-08-23 DIAGNOSIS — E669 Obesity, unspecified: Secondary | ICD-10-CM

## 2021-08-23 DIAGNOSIS — E7800 Pure hypercholesterolemia, unspecified: Secondary | ICD-10-CM

## 2021-08-23 DIAGNOSIS — E1169 Type 2 diabetes mellitus with other specified complication: Secondary | ICD-10-CM

## 2021-08-23 DIAGNOSIS — I10 Essential (primary) hypertension: Secondary | ICD-10-CM

## 2021-08-23 LAB — COMPREHENSIVE METABOLIC PANEL
ALT: 25 U/L (ref 0–55)
AST (SGOT): 17 U/L (ref 5–41)
Albumin/Globulin Ratio: 1.1 (ref 0.9–2.2)
Albumin: 3.7 g/dL (ref 3.5–5.0)
Alkaline Phosphatase: 140 U/L — ABNORMAL HIGH (ref 37–117)
Anion Gap: 12 (ref 5.0–15.0)
BUN: 28 mg/dL — ABNORMAL HIGH (ref 7.0–21.0)
Bilirubin, Total: 0.6 mg/dL (ref 0.2–1.2)
CO2: 28 mEq/L (ref 17–29)
Calcium: 9.2 mg/dL (ref 8.5–10.5)
Chloride: 100 mEq/L (ref 99–111)
Creatinine: 1.2 mg/dL — ABNORMAL HIGH (ref 0.4–1.0)
Globulin: 3.5 g/dL (ref 2.0–3.6)
Glucose: 177 mg/dL — ABNORMAL HIGH (ref 70–100)
Potassium: 4.6 mEq/L (ref 3.5–5.3)
Protein, Total: 7.2 g/dL (ref 6.0–8.3)
Sodium: 140 mEq/L (ref 135–145)
eGFR: 49.7 mL/min/{1.73_m2} — AB (ref >=60)

## 2021-08-23 LAB — HEMOGLOBIN A1C
Average Estimated Glucose: 203 mg/dL
Hemoglobin A1C: 8.7 % — ABNORMAL HIGH (ref 4.6–5.6)

## 2021-08-23 LAB — LIPID PANEL
Cholesterol / HDL Ratio: 3.5 Index
Cholesterol: 155 mg/dL (ref 0–199)
HDL: 44 mg/dL (ref 40–9999)
LDL Calculated: 76 mg/dL (ref 0–99)
Triglycerides: 173 mg/dL — ABNORMAL HIGH (ref 34–149)
VLDL Calculated: 35 mg/dL (ref 10–40)

## 2021-08-23 LAB — HEMOLYSIS INDEX: Hemolysis Index: 1 Index (ref 0–24)

## 2021-08-25 ENCOUNTER — Other Ambulatory Visit (INDEPENDENT_AMBULATORY_CARE_PROVIDER_SITE_OTHER): Payer: Self-pay | Admitting: Internal Medicine

## 2021-08-25 ENCOUNTER — Encounter (INDEPENDENT_AMBULATORY_CARE_PROVIDER_SITE_OTHER): Payer: Self-pay | Admitting: Internal Medicine

## 2021-08-25 DIAGNOSIS — E1129 Type 2 diabetes mellitus with other diabetic kidney complication: Secondary | ICD-10-CM

## 2021-08-25 DIAGNOSIS — R809 Proteinuria, unspecified: Secondary | ICD-10-CM

## 2021-08-26 ENCOUNTER — Encounter (INDEPENDENT_AMBULATORY_CARE_PROVIDER_SITE_OTHER): Payer: Self-pay

## 2021-10-19 ENCOUNTER — Ambulatory Visit (INDEPENDENT_AMBULATORY_CARE_PROVIDER_SITE_OTHER): Payer: Medicare PPO | Admitting: Nurse Practitioner

## 2021-10-19 ENCOUNTER — Encounter (INDEPENDENT_AMBULATORY_CARE_PROVIDER_SITE_OTHER): Payer: Self-pay | Admitting: Nurse Practitioner

## 2021-10-19 VITALS — BP 168/72 | HR 91 | Temp 97.6°F | Wt 173.2 lb

## 2021-10-19 DIAGNOSIS — Z4689 Encounter for fitting and adjustment of other specified devices: Secondary | ICD-10-CM

## 2021-10-19 DIAGNOSIS — N813 Complete uterovaginal prolapse: Secondary | ICD-10-CM

## 2021-10-19 DIAGNOSIS — Z960 Presence of urogenital implants: Secondary | ICD-10-CM | POA: Insufficient documentation

## 2021-10-19 DIAGNOSIS — N952 Postmenopausal atrophic vaginitis: Secondary | ICD-10-CM

## 2021-10-19 MED ORDER — ESTRADIOL 0.1 MG/GM VA CREA
0.4000 g | TOPICAL_CREAM | VAGINAL | 3 refills | Status: DC
Start: 2021-10-19 — End: 2022-09-02

## 2021-10-19 NOTE — Progress Notes (Signed)
Chief Complaint   Patient presents with    Pessary Check     Pt had a pessary - however it would not stay in and kept falling out. Pt has been very ucomfortable and feels as though she can not completely void when urinating.         66 y.o. y/o female is here today for pessary fitting. She was fit with a #6 ring with support pessary for management of her stage 3 prolapse. She reports that it kept falling out so she stopped using. She originally was planning on surgical repair of her prolapse (colpocliesis) but it was cancelled due to her A1c being too high. She has been uncomfortable due to the prolapse, feels as if she is not able to fully empty her bladder.    She is diabetic, last A1c 8.7 on 08/23/2021    Previous HPI  Emerly Sassone is a 66y/o G3P3 female who presents today for symptoms of prolapse that have gotten progressively worse in the last 8 months. Originally she felt a small ball in her vagina and now she feels like she has to press her uterus back up into her pelvis. She has no issues with urinating and denies any pain. She is not currently sexually active and has had no issues with intercourse in the past. 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     Review of Systems  No changes to her ROS since last visit except as referenced in the HPI    Physical Exam    Urogynecology Exam    Chaperone: Amy C, MA present during exam     Constitutional   General Appearance: HEALTHY-APPEARING  Level of Distress:   Ambulation: AMBULATING NORMALLY     Psychiatric  Mental Status: Normal Mood and Normal Affect  Orientation: to person, to place, and to time    Lungs  Respiratory effort:   No  dyspnea    Female Genitalia  External:   No  masses   Yes  atrophy  Yes  erythema to the prolapse.    Bladder/Urethra:   Yes  normal meatus  No caruncle   No  urethral discharge  No  urethral mass   No  bladder tenderness   No  urethral tenderness    Vagina:   Yes Atrophy  No  Tenderness,   No  Abnormal vaginal discharge   No  Vesicle(s)   No Erythema      Rectum: Yes normal perianal skin     Procedure Documentation    Pessary Insertion    Risks and benefits of pessary insertion procedure were explained including but not limited to: bleeding, infection, damage to organs, and changes to bowel and/or bladder control. Pessary management, available options for vaginal estrogen replacement, symptoms suggesting ulceration or pessary-related infection, techniques for self-removal and repositioning of the device, and alternatives to pessary devices for the management of pelvic prolapse were discussed. Options to prevent vaginal irritation were discussed including vaginal estrogen, systemic hormone therapy, osphena, trimosan, or no treatment.    All the patient's questions were answered and patient consented to procedure.    Pessary insertion completed without complications, patient tolerated well.    1. Type: Gelhorn LS  Size: # 5  easily expelled    2. Type: Gelhorn LS  Size: # 6  comfortable    Assessment:    1. Complete uterovaginal prolapse    2. Vaginal atrophy  3. Encounter for fitting and adjustment of pessary     Plan:    Pessary Fitting  A #6 gelhorn pessary was placed and found to be comfortably in place and her prolapse was successfully reduced.  It was not pressing too firmly on any tissue.  It remained in place with valsalva and moving around the room.   Advised that she may need to provide counter pressure to the knob on the pessary if she is bearing down to have a bowel movement.  Advised to call office with concerns of pain, abnormal bleeding, difficulty emptying the bladder or bowels, or pessary displacement.   She will RTC in 2 for pessary care or sooner with increased vaginal bleeding, pelvic pain, difficulty voiding or defecating.  Ultimately she desires surgery. Advised that she would need to have better diabetic control with an A1c < 7 to proceed with a colpocleisis.

## 2021-11-03 ENCOUNTER — Other Ambulatory Visit (INDEPENDENT_AMBULATORY_CARE_PROVIDER_SITE_OTHER): Payer: Self-pay | Admitting: Internal Medicine

## 2021-11-03 DIAGNOSIS — E669 Obesity, unspecified: Secondary | ICD-10-CM

## 2021-11-03 DIAGNOSIS — I5030 Unspecified diastolic (congestive) heart failure: Secondary | ICD-10-CM

## 2021-11-09 ENCOUNTER — Ambulatory Visit (INDEPENDENT_AMBULATORY_CARE_PROVIDER_SITE_OTHER): Payer: Medicare PPO | Admitting: Nurse Practitioner

## 2021-11-15 ENCOUNTER — Ambulatory Visit (INDEPENDENT_AMBULATORY_CARE_PROVIDER_SITE_OTHER): Payer: Medicare PPO | Admitting: Nurse Practitioner

## 2021-11-15 VITALS — BP 150/80 | HR 64 | Resp 20 | Wt 180.0 lb

## 2021-11-15 DIAGNOSIS — N813 Complete uterovaginal prolapse: Secondary | ICD-10-CM

## 2021-11-15 DIAGNOSIS — Z4689 Encounter for fitting and adjustment of other specified devices: Secondary | ICD-10-CM

## 2021-11-15 DIAGNOSIS — Z960 Presence of urogenital implants: Secondary | ICD-10-CM

## 2021-11-15 DIAGNOSIS — N952 Postmenopausal atrophic vaginitis: Secondary | ICD-10-CM

## 2021-11-15 NOTE — Progress Notes (Signed)
Chief Complaint   Patient presents with    Pessary Check     Patient reports no issues with her pessary.          66 y.o. y/o female is here today for pessary check of her #6 gelhorn pessary. She is doing very well with the pessary. She was initially holding it in place to move her bowels but that is no longer necessary.     Previous HPI  [name] is a 66y/o G3P3 female who presents today for symptoms of prolapse that have gotten progressively worse in the last 8 months. Originally she felt a small ball in her vagina and now she feels like she has to press her uterus back up into her pelvis. She has no issues with urinating and denies any pain. She is not currently sexually active and has had no issues with intercourse in the past. 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 She was  initially fit with a #6 ring with support pessary for management of her stage 3 prolapse. She reports that it kept falling out so she stopped using. She originally was planning on surgical repair of her prolapse (colpocliesis) but it was cancelled due to her A1c being too high. She has been uncomfortable due to the prolapse, feels as if she is not able to fully empty her bladder.     She is diabetic, last A1c 8.7 on 08/23/2021    History of Present Illness    Pelvic Organ Prolapse:  For symptoms patient reports:   No vaginal bulging   No vaginal pressure   No vaginal heaviness  is using a pessary  No vaginal bleeding   No vaginal discharge   No sexually active   Yes using vaginal estrogen     Genitourinary:  For bladder symptoms patient reports:    No  urge incontinence      No  urinary urgency   No  urinary frequency  Yes  nocturia   Yes SUI - worse with pessary in place    For voiding symptoms patient reports:   No difficulty urinating    No UTIs  No dysuria   No hematuria.    Colorectal  For symptoms patient reports:   No constipation     No fecal incontinence  No rectal  urgency    Review of Systems:  No changes to her ROS since last visit except as referenced in the HPI    Physical Exam:    Chaperone: Johanna R, MA present during exam     Constitutional:   General Appearance: HEALTHY-APPEARING  Level of Distress: NAD  Ambulation: AMBULATING NORMALLY     Psychiatric:  Mental Status: Normal Mood and Normal Affect  Orientation: to person, to place, and to time    Lungs:  Respiratory effort: No  dyspnea    Female Genitalia:  External:   No  masses,   Yes  atrophy  No  erythema     Bladder/Urethra:   Yes  normal meatus      Rectum:   Yes normal perianal skin     Measurement of post-voiding residual urine and/or bladder capacity of ultrasound, non-imaging: 59mL    Procedure Documentation:    Pessary Check:    Risks and benefits of pessary check procedure were explained including but not limited to: bleeding, infection, damage to organs, and changes to bowel and/or bladder control. Pessary management, available options  for vaginal estrogen replacement, symptoms suggesting ulceration or pessary-related infection, techniques for self-removal and repositioning of the device, and alternatives to pessary devices for the management of pelvic prolapse were discussed. Options to prevent vaginal irritation were discussed including vaginal estrogen, systemic hormone therapy, osphena, trimosan, or no treatment.  All the patient's questions were answered and patient consented to procedure.    For pessary check, pessary was removed without incident and cleaned.  Vagina examined without pessary using a speculum.    Findings:  Assessment of pessary location: healthy  Pressure points:No   Ulceration: No   Abnormal discharge: No   Abrasions: No Location:     Pessary type: Gelhorn LS  Size: # 6    Pessary insertion completed  without complications, patient tolerated well.    Assessment:    1. Complete uterovaginal prolapse        2. Vaginal atrophy        3. Vaginal pessary in situ        4. Encounter for  pessary maintenance             Plan:    Prolapse/Pessary maintenance  She will RTC in 3 months for pessary care or sooner with vaginal bleeding, pelvic pain, difficulty voiding or defecating.    Vaginal atrophy  Continue use of vaginal estrogen cream.    I spent 10 minutes of face-to-face time with the patient on history and exam, discussion of prior testing, and counseling/education as in the above assessment documentation, as well as an   additional 5 minutes of non-face-to-face time on the same day as the encounter on visit preparation, documentation, and test ordering to care for this new patient, excluding any procedures performed

## 2021-11-19 ENCOUNTER — Encounter (INDEPENDENT_AMBULATORY_CARE_PROVIDER_SITE_OTHER): Payer: Self-pay | Admitting: Residents

## 2021-11-19 ENCOUNTER — Ambulatory Visit (INDEPENDENT_AMBULATORY_CARE_PROVIDER_SITE_OTHER): Payer: Medicare PPO | Admitting: Residents

## 2021-11-19 ENCOUNTER — Ambulatory Visit (INDEPENDENT_AMBULATORY_CARE_PROVIDER_SITE_OTHER): Payer: Medicare PPO | Admitting: Internal Medicine

## 2021-11-19 ENCOUNTER — Telehealth (INDEPENDENT_AMBULATORY_CARE_PROVIDER_SITE_OTHER): Payer: Self-pay | Admitting: Internal Medicine

## 2021-11-19 DIAGNOSIS — I10 Essential (primary) hypertension: Secondary | ICD-10-CM

## 2021-11-19 DIAGNOSIS — I480 Paroxysmal atrial fibrillation: Secondary | ICD-10-CM

## 2021-11-19 DIAGNOSIS — E1169 Type 2 diabetes mellitus with other specified complication: Secondary | ICD-10-CM

## 2021-11-19 DIAGNOSIS — Z23 Encounter for immunization: Secondary | ICD-10-CM

## 2021-11-19 DIAGNOSIS — E669 Obesity, unspecified: Secondary | ICD-10-CM

## 2021-11-19 LAB — HEMOGLOBIN A1C
Average Estimated Glucose: 177.2 mg/dL
Hemoglobin A1C: 7.8 % — ABNORMAL HIGH (ref 4.6–5.6)

## 2021-11-19 MED ORDER — OZEMPIC (0.25 OR 0.5 MG/DOSE) 2 MG/1.5ML SC SOPN
0.2500 mg | PEN_INJECTOR | SUBCUTANEOUS | 1 refills | Status: DC
Start: 2021-11-19 — End: 2022-08-23

## 2021-11-19 MED ORDER — ISOSORBIDE MONONITRATE ER 30 MG PO TB24
30.0000 mg | ORAL_TABLET | Freq: Every day | ORAL | 3 refills | Status: DC
Start: 2021-11-19 — End: 2022-08-23

## 2021-11-19 MED ORDER — NOVOLIN N 100 UNIT/ML SC SUSP
SUBCUTANEOUS | 3 refills | Status: DC
Start: 2021-11-19 — End: 2022-08-23

## 2021-11-19 MED ORDER — NOVOLIN N 100 UNIT/ML SC SUSP
SUBCUTANEOUS | 3 refills | Status: DC
Start: 2021-11-19 — End: 2021-11-19

## 2021-11-19 MED ORDER — AMLODIPINE BESYLATE 2.5 MG PO TABS
10.0000 mg | ORAL_TABLET | Freq: Every day | ORAL | 2 refills | Status: DC
Start: 2021-11-19 — End: 2022-02-08

## 2021-11-19 MED ORDER — LISINOPRIL 40 MG PO TABS
40.0000 mg | ORAL_TABLET | Freq: Every day | ORAL | 1 refills | Status: DC
Start: 2021-11-19 — End: 2022-08-23

## 2021-11-19 MED ORDER — GLUCOSE BLOOD VI STRP
ORAL_STRIP | 2 refills | Status: DC
Start: 2021-11-19 — End: 2022-12-21

## 2021-11-19 MED ORDER — INSULIN SYRINGE-NEEDLE U-100 31G X 5/16" 1 ML MISC
2 refills | Status: DC
Start: 2021-11-19 — End: 2022-12-21

## 2021-11-19 MED ORDER — CARVEDILOL 25 MG PO TABS
25.0000 mg | ORAL_TABLET | Freq: Two times a day (BID) | ORAL | 1 refills | Status: DC
Start: 2021-11-19 — End: 2022-08-23

## 2021-11-19 MED ORDER — APIXABAN 5 MG PO TABS
5.0000 mg | ORAL_TABLET | Freq: Two times a day (BID) | ORAL | 0 refills | Status: DC
Start: 2021-11-19 — End: 2022-08-23

## 2021-11-19 NOTE — Progress Notes (Signed)
[facility])    Language Spoken: Spanish  Video interpretation services used    HPI:   This is a 66 y.o. female w/ uncontrolled HTN, T2DM, HLD presenting for f/u regarding blood pressure control.  Of note, patient blood pressure during clinic today was 191/73.  Patient states that her blood pressure has minimally improved since up titration of Coreg during her previous visit with [place] several months ago.  Mentioned that her blood pressure on average have been in the 140s to 150s systolic at home with rare ocurrences of extremely elevated blood pressures up to >200s at times.  Endorses right flank pain that radiates to the front for the past 2 months occasionally that improves with Tylenol and positional changes when she stands up.  Also endorses significant right knee pain but denies any other associated symptoms.  No fever, chills, dysuria, chest pain or shortness of breath    Patient reported that she is due to follow-up in cardiology clinic next week who has been managing her hypertension and atrial fibrillation.     Past Medical History:   Diagnosis Date    Congenital anomaly of heart     Diabetes mellitus type 2 in obese     Diabetic retinopathy 06/28/2013    Of the right eye, per patient    Glaucoma 06/01/2012    Hyperlipidemia     Hypertension     Obesity     Venous insufficiency 09/17/2014    Followed at [facility].  Wears compression stockings.      Past Surgical History:   Procedure Laterality Date    BREAST CYST EXCISION  1993    bilateral    TUBAL LIGATION       Family History   Problem Relation Age of Onset    Heart disease Mother     Diabetes Mother     Hypertension Mother     Hyperlipidemia Mother     Heart attack Mother     Heart disease Father     Diabetes Father     Hypertension Father     Hyperlipidemia Father     Cirrhosis Father     Diabetes Sister     Liver cancer Maternal Aunt     Cancer Maternal Uncle         liver     Social History     Tobacco Use     Smoking status: Never    Smokeless tobacco: Never   Vaping Use    Vaping Use: Never used   Substance Use Topics    Alcohol use: No    Drug use: No       ALLERGIES:     Allergies   Allergen Reactions    Shellfish-Derived Products        MEDICATIONS:     Current Outpatient Medications:     albuterol sulfate HFA (PROVENTIL) 108 (90 Base) MCG/ACT inhaler, Inhale 2 puffs into the lungs every 4 (four) hours as needed for Wheezing (Patient not taking: Reported on 10/19/2021), Disp: 6.7 g, Rfl: 0    atorvastatin (LIPITOR) 40 MG tablet, Take 1 tablet (40 mg) by mouth every evening, Disp: , Rfl:     Blood Glucose Monitoring Suppl (ACCU-CHEK AVIVA PLUS) w/Device Kit, Check blood glucose level twice daily. ICD 10 Code E11.9, Z79.4, Disp: 1 kit, Rfl: 1    bumetanide (BUMEX) 1 MG tablet, Take 1 tablet (1 mg) by mouth daily, Disp: , Rfl:  bumetanide (BUMEX) 1 MG tablet, Take by mouth Once every Monday, Wednesday and Friday morning 2 mg MWF, Disp: , Rfl:     Calcium Carbonate-Vitamin D (CALCIUM-D PO), Take 1 tablet by mouth daily., Disp: , Rfl:     carvedilol (COREG) 25 MG tablet, Take 1 tablet (25 mg) by mouth 2 (two) times daily with meals, Disp: 180 tablet, Rfl: 1    Eliquis 5 MG, TAKE ONE TABLET BY MOUTH EVERY 12 HOURS, Disp: 180 tablet, Rfl: 0    estradiol (ESTRACE) 0.1 MG/GM vaginal cream, Place 0.4 g vaginally twice a week, Disp: 42.5 g, Rfl: 3    glucose blood (Accu-Chek Aviva) test strip, Check blood glucose level twice daily. ICD 10 Code E11.9, Z79.4, Disp: 200 each, Rfl: 2    insulin NPH (NovoLIN N) 100 UNIT/ML injection, INJECT 20 UNITS SUBCUTANEOUSLY WITH BREAKFST AND 40  UNITS WITH DINNER, Disp: 60 mL, Rfl: 3    Insulin Syringe-Needle U-100 (BD INSULIN SYRINGE ULTRAFINE) 31G X 5/16" 1 ML Misc, ADMINSTER INSULIN UNDER THE SKIN 2 TIMES A DAY, Disp: 300 each, Rfl: 2    isosorbide mononitrate (IMDUR) 30 MG 24 hr tablet, Take 1 tablet (30 mg) by mouth daily, Disp: 90 tablet, Rfl: 3    Jardiance 25 MG tablet, TAKE ONE  TABLET BY MOUTH EVERY MORNING, Disp: 90 tablet, Rfl: 0    lisinopril (ZESTRIL) 40 MG tablet, TAKE ONE TABLET BY MOUTH DAILY (Patient taking differently: Take 1 tablet (40 mg) by mouth daily Med was held in December 2022 for AKI), Disp: 90 tablet, Rfl: 1    Multiple Vitamins-Minerals (multivitamin with minerals) tablet, Take 1 tablet by mouth daily, Disp: , Rfl:     timolol (TIMOPTIC) 0.5 % ophthalmic solution, Place 1 drop into both eyes daily, Disp: , Rfl:     PHYSICAL EXAM:   BP 191/73   Pulse 93   Temp 97.9 F (36.6 C)   Wt 81.2 kg (179 lb)   SpO2 97%   BMI 29.79 kg/m     Physical Exam    General: awake, alert, oriented x 3  Cardiovascular: S1, S2, regular rate and rhythm, no murmurs, rubs, or gallops  Lungs: clear to auscultation bilaterally, without wheezing, rhonchi, or rales  Abdomen: soft, non tender, normal bowel sounds  Extremities: no clubbing, cyanosis, or edema.  Right knee brace in place  Skin: no rashes or lesions noted    DIAGNOSTICS:     Lab Results   Component Value Date    WBC 8.10 04/22/2021    HGB 14.6 04/22/2021    HCT 42.2 04/22/2021    PLT 267 04/22/2021    CHOL 155 08/23/2021    TRIG 173 (H) 08/23/2021    HDL 44 08/23/2021    LDL 76 08/23/2021    ALT 25 08/23/2021    AST 17 08/23/2021    NA 140 08/23/2021    K 4.6 08/23/2021    CL 100 08/23/2021    CREAT 1.2 (H) 08/23/2021    BUN 28.0 (H) 08/23/2021    CO2 28 08/23/2021    TSH 0.73 04/20/2021    INR 0.9 10/27/2017    GLU 177 (H) 08/23/2021    HGBA1C 8.7 (H) 08/23/2021    ALKPHOS 140 (H) 08/23/2021       No results found.    ACTIVE PROBLEMS:     Patient Active Problem List   Diagnosis    Glaucoma    Type 2 diabetes mellitus without complication, with long-term current  use of insulin    Hypertension    Hyperlipidemia    Fatty liver    Non-rheumatic mitral regurgitation    Venous insufficiency    Uterovaginal prolapse, complete    CAD (coronary artery disease)    Atrial fibrillation    Diastolic heart failure    Nodule of right lung     Pulmonary hypertension    History of COVID-19    Pulmonary edema, acute    Diabetes mellitus type 2 in obese    Moderate nonproliferative diabetic retinopathy    Vaginal pessary in situ       ASSESSMENT and PLAN:   This is a 66 y.o. female w/ uncontrolled HTN, T2DM, HLD presenting for f/u regarding blood pressure control.       1. Primary hypertension, uncontrolled  Patient reports that blood pressure on average has been 140s to 150s systolic at home.  Blood pressure today in clinic was 191/73.  -c/w lisinopril 40 mg daily, Imdur 30 mg daily, Coreg 25 mg twice daily  -We will add Norvasc 10 mg daily  -Patient will follow up with cardiology next week and can evaluate if addition of amlodipine has led to improvement in blood pressure.  -We will defer to cardiology regarding need for secondary hypertension work-up     2. Diabetes mellitus type 2 in obese  Hemoglobin A1c of 8.7.  Patient states that fasting glucose have been in the 130s to 140s at home.  -We will discontinue Jardiance due to patient's inability to afford it.  -Checking hemoglobin A1c today  -Add Ozempic and optimize insulin to 25 units of NPH every morning and 40 units every afternoon (previously 20 units every morning and 40 units every afternoon)  -Refer to endocrinology for DM optimization     3. Pure hypercholesterolemia  -On atorvastatin    4. CKD w/ microalbuminemia  Likely secondary to hypertension plus diabetes.  Cr have been ranging 1.2-1.5 in the past several months.  Most recently 1.2  -checking CMP  -On lisinopril    Tessa Lerner, MD  Internal Medicine Resident, PGY3

## 2021-11-19 NOTE — Telephone Encounter (Signed)
Pharmacist is calling to obtain clarification on the       amLODIPine (NORVASC) 2.5 MG tablet   And   insulin NPH (NovoLIN N) 100 UNIT/ML injection   Prescriptions that were sent into the pharmacy earlier today.    Please call: [phone]    Thanks.

## 2021-11-20 LAB — COMPREHENSIVE METABOLIC PANEL
ALT: 21 U/L (ref 0–55)
AST (SGOT): 22 U/L (ref 5–41)
Albumin/Globulin Ratio: 1 (ref 0.9–2.2)
Albumin: 3.8 g/dL (ref 3.5–5.0)
Alkaline Phosphatase: 141 U/L — ABNORMAL HIGH (ref 37–117)
Anion Gap: 9 (ref 5.0–15.0)
BUN: 22 mg/dL — ABNORMAL HIGH (ref 7.0–21.0)
Bilirubin, Total: 0.4 mg/dL (ref 0.2–1.2)
CO2: 29 mEq/L (ref 17–29)
Calcium: 9.3 mg/dL (ref 8.5–10.5)
Chloride: 104 mEq/L (ref 99–111)
Creatinine: 1.3 mg/dL — ABNORMAL HIGH (ref 0.4–1.0)
Globulin: 3.8 g/dL — ABNORMAL HIGH (ref 2.0–3.6)
Glucose: 201 mg/dL — ABNORMAL HIGH (ref 70–100)
Potassium: 4.7 mEq/L (ref 3.5–5.3)
Protein, Total: 7.6 g/dL (ref 6.0–8.3)
Sodium: 142 mEq/L (ref 135–145)
eGFR: 45.3 mL/min/{1.73_m2} — AB (ref >=60)

## 2021-11-20 LAB — HEMOLYSIS INDEX: Hemolysis Index: 4 Index (ref 0–24)

## 2021-11-25 ENCOUNTER — Encounter (INDEPENDENT_AMBULATORY_CARE_PROVIDER_SITE_OTHER): Payer: Self-pay | Admitting: Internal Medicine

## 2021-11-26 ENCOUNTER — Encounter (INDEPENDENT_AMBULATORY_CARE_PROVIDER_SITE_OTHER): Payer: Self-pay

## 2021-11-26 ENCOUNTER — Encounter (INDEPENDENT_AMBULATORY_CARE_PROVIDER_SITE_OTHER): Payer: Self-pay | Admitting: Internal Medicine

## 2021-12-07 ENCOUNTER — Encounter (INDEPENDENT_AMBULATORY_CARE_PROVIDER_SITE_OTHER): Payer: Self-pay | Admitting: Internal Medicine

## 2022-01-04 NOTE — Progress Notes (Unsigned)
No chief complaint on file.        66 y.o. y/o female is here today for pessary check of her #6 gelhorn pessary. She is doing very well with the pessary. She was initially holding it in place to move her bowels but that is no longer necessary.     Previous HPI  [name] is a 66y/o G3P3 female who presents today for symptoms of prolapse that have gotten progressively worse in the last 8 months. Originally she felt a small ball in her vagina and now she feels like she has to press her uterus back up into her pelvis. She has no issues with urinating and denies any pain. She is not currently sexually active and has had no issues with intercourse in the past. 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 She was  initially fit with a #6 ring with support pessary for management of her stage 3 prolapse. She reports that it kept falling out so she stopped using. She originally was planning on surgical repair of her prolapse (colpocliesis) but it was cancelled due to her A1c being too high. She has been uncomfortable due to the prolapse, feels as if she is not able to fully empty her bladder.     History of Present Illness    Pelvic Organ Prolapse:  For symptoms patient reports:   No vaginal bulging   No vaginal pressure   No vaginal heaviness  is using a pessary  No vaginal bleeding   No vaginal discharge   No sexually active   Yes using vaginal estrogen     Genitourinary:  For bladder symptoms patient reports:    No  urge incontinence      No  urinary urgency   No  urinary frequency  Yes  nocturia   Yes SUI - worse with pessary in place    For voiding symptoms patient reports:   No difficulty urinating    No UTIs  No dysuria   No hematuria.    Colorectal  For symptoms patient reports:   No constipation     No fecal incontinence  No rectal urgency    Review of Systems:  No changes to her ROS since last visit except as referenced in the HPI    Physical Exam:    Chaperone:  Johanna R, MA present during exam     Constitutional:   General Appearance: HEALTHY-APPEARING  Level of Distress: NAD  Ambulation: AMBULATING NORMALLY     Psychiatric:  Mental Status: Normal Mood and Normal Affect  Orientation: to person, to place, and to time    Lungs:  Respiratory effort: No  dyspnea    Female Genitalia:  External:   No  masses,   Yes  atrophy  No  erythema     Bladder/Urethra:   Yes  normal meatus      Rectum:   Yes normal perianal skin     Measurement of post-voiding residual urine and/or bladder capacity of ultrasound, non-imaging: 0mL    Procedure Documentation:    Pessary Check:    Risks and benefits of pessary check procedure were explained including but not limited to: bleeding, infection, damage to organs, and changes to bowel and/or bladder control. Pessary management, available options for vaginal estrogen replacement, symptoms suggesting ulceration or pessary-related infection, techniques for self-removal and repositioning of the device, and alternatives to pessary devices for the management of pelvic prolapse were discussed.  Options to prevent vaginal irritation were discussed including vaginal estrogen, systemic hormone therapy, osphena, trimosan, or no treatment.  All the patient's questions were answered and patient consented to procedure.    For pessary check, pessary was removed without incident and cleaned.  Vagina examined without pessary using a speculum.    Findings:  Assessment of pessary location: healthy  Pressure points:No   Ulceration: No   Abnormal discharge: No   Abrasions: No Location:     Pessary type: Gelhorn LS  Size: # 6    Pessary insertion completed  without complications, patient tolerated well.    Assessment:    No diagnosis found.       Plan:    Prolapse/Pessary maintenance  She will RTC in 3 months for pessary care or sooner with vaginal bleeding, pelvic pain, difficulty voiding or defecating.    Vaginal atrophy  Continue use of vaginal estrogen cream.    I  spent 10 minutes of face-to-face time with the patient on history and exam, discussion of prior testing, and counseling/education as in the above assessment documentation, as well as an   additional 5 minutes of non-face-to-face time on the same day as the encounter on visit preparation, documentation, and test ordering to care for this new patient, excluding any procedures performed

## 2022-01-05 ENCOUNTER — Encounter (INDEPENDENT_AMBULATORY_CARE_PROVIDER_SITE_OTHER): Payer: Self-pay | Admitting: Nurse Practitioner

## 2022-01-05 ENCOUNTER — Ambulatory Visit (INDEPENDENT_AMBULATORY_CARE_PROVIDER_SITE_OTHER): Payer: Medicare PPO | Admitting: Nurse Practitioner

## 2022-01-05 VITALS — BP 184/73 | HR 71 | Temp 97.8°F | Resp 22 | Wt 183.8 lb

## 2022-01-05 DIAGNOSIS — N813 Complete uterovaginal prolapse: Secondary | ICD-10-CM

## 2022-01-05 DIAGNOSIS — N952 Postmenopausal atrophic vaginitis: Secondary | ICD-10-CM

## 2022-01-05 DIAGNOSIS — Z4689 Encounter for fitting and adjustment of other specified devices: Secondary | ICD-10-CM

## 2022-01-05 DIAGNOSIS — Z960 Presence of urogenital implants: Secondary | ICD-10-CM

## 2022-01-05 NOTE — Patient Instructions (Signed)
INSTRUCTIONS FROM Shona Needles, MSN, CRNP    You have prolapse of the vaginal walls which you are managing with a pessary.   Your pessary was cleaned and replaced.   I will see you back every 3 months for a follow up appointment to clean the pessary and make sure your vaginal tissue remains healthy.  Please don't hesitate to call if you have any issues with the pessary shifting, vaginal pain, vaginal bleeding or difficulty emptying your bladder or bowels prior to your next appointment.     Thank you for your visit to our office today [name]. Please feel free to call us with any questions.     Gevena Mart, NP  [facility]

## 2022-01-06 NOTE — Progress Notes (Signed)
Chief Complaint   Patient presents with    Pessary Check     Patient complains of discomfort with her current pessary and would like to find a better fit.          66 y.o. y/o female is here today for pessary fitting. She was fit for a #6 gelhorn but it has moved and caused her discomfort on a couple of occasions so would like to try a different size.     Previous HPI  [name] is a 66y/o G3P3 female who presents today for symptoms of prolapse that have gotten progressively worse in the last 8 months. Originally she felt a small ball in her vagina and now she feels like she has to press her uterus back up into her pelvis. She has no issues with urinating and denies any pain. She is not currently sexually active and has had no issues with intercourse in the past. 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 She was  initially fit with a #6 ring with support pessary for management of her stage 3 prolapse. She reports that it kept falling out so she stopped using. She originally was planning on surgical repair of her prolapse (colpocliesis) but it was cancelled due to her A1c being too high. She has been uncomfortable due to the prolapse, feels as if she is not able to fully empty her bladder.    Review of Systems  No changes to her ROS since last visit except as referenced in the HPI    Physical Exam    Urogynecology Exam    Chaperone: Johanna R. MA present during exam     Constitutional   General Appearance: HEALTHY-APPEARING  Level of Distress:   Ambulation: AMBULATING NORMALLY     Psychiatric  Mental Status: Normal Mood and Normal Affect  Orientation: to person, to place, and to time    Lungs  Respiratory effort:   No  dyspnea    Female Genitalia  External:   No  masses   Yes  atrophy  No  erythema     Bladder/Urethra:   Yes  normal meatus     Vagina:   Yes Atrophy    Rectum: Yes normal perianal skin       Procedure Documentation    #6 gelhorn pessary removed  without difficulty, it was not supporting her prolapse well.    Pessary Insertion    Risks and benefits of pessary insertion procedure were explained including but not limited to: bleeding, infection, damage to organs, and changes to bowel and/or bladder control. Pessary management, available options for vaginal estrogen replacement, symptoms suggesting ulceration or pessary-related infection, techniques for self-removal and repositioning of the device, and alternatives to pessary devices for the management of pelvic prolapse were discussed. Options to prevent vaginal irritation were discussed including vaginal estrogen, systemic hormone therapy, osphena, trimosan, or no treatment.    All the patient's questions were answered and patient consented to procedure.    Pessary insertion completed without complications, patient tolerated well.    1. Type: Gelhorn LS  Size: # 7  comfortable    Assessment:  1. Complete uterovaginal prolapse        2. Encounter for fitting and adjustment of pessary        3. Vaginal pessary in situ          Plan:  Prolapse/Pessary fitting  She was fit with #7 gelhorn pessary  today and tolerated the procedure well.  Advised that if she notes it shifting and causes discomfort she can try adjusting by placing a finger in the vagina and gently pushing on the knob.   She will RTC in 2-4 weeks for pessary care or sooner with increased vaginal bleeding, pelvic pain, difficulty voiding or defecating.

## 2022-01-07 ENCOUNTER — Ambulatory Visit (INDEPENDENT_AMBULATORY_CARE_PROVIDER_SITE_OTHER): Payer: Medicare PPO | Admitting: Nurse Practitioner

## 2022-01-07 ENCOUNTER — Encounter (INDEPENDENT_AMBULATORY_CARE_PROVIDER_SITE_OTHER): Payer: Self-pay | Admitting: Nurse Practitioner

## 2022-01-07 VITALS — BP 185/75 | HR 60 | Resp 19 | Wt 180.4 lb

## 2022-01-07 DIAGNOSIS — Z960 Presence of urogenital implants: Secondary | ICD-10-CM

## 2022-01-07 DIAGNOSIS — Z4689 Encounter for fitting and adjustment of other specified devices: Secondary | ICD-10-CM

## 2022-01-07 DIAGNOSIS — N813 Complete uterovaginal prolapse: Secondary | ICD-10-CM

## 2022-01-12 ENCOUNTER — Encounter (INDEPENDENT_AMBULATORY_CARE_PROVIDER_SITE_OTHER): Payer: Self-pay

## 2022-01-21 ENCOUNTER — Encounter (INDEPENDENT_AMBULATORY_CARE_PROVIDER_SITE_OTHER): Payer: Self-pay | Admitting: Internal Medicine

## 2022-02-08 ENCOUNTER — Encounter (INDEPENDENT_AMBULATORY_CARE_PROVIDER_SITE_OTHER): Payer: Self-pay | Admitting: Internal Medicine

## 2022-02-08 ENCOUNTER — Ambulatory Visit (INDEPENDENT_AMBULATORY_CARE_PROVIDER_SITE_OTHER): Payer: Medicare (Managed Care) | Admitting: Internal Medicine

## 2022-02-08 VITALS — BP 125/65 | HR 83 | Temp 97.5°F | Ht 63.78 in | Wt 187.0 lb

## 2022-02-08 DIAGNOSIS — Z794 Long term (current) use of insulin: Secondary | ICD-10-CM

## 2022-02-08 DIAGNOSIS — R5383 Other fatigue: Secondary | ICD-10-CM

## 2022-02-08 DIAGNOSIS — Z23 Encounter for immunization: Secondary | ICD-10-CM

## 2022-02-08 DIAGNOSIS — R809 Proteinuria, unspecified: Secondary | ICD-10-CM

## 2022-02-08 DIAGNOSIS — R109 Unspecified abdominal pain: Secondary | ICD-10-CM

## 2022-02-08 DIAGNOSIS — N1832 Chronic kidney disease, stage 3b: Secondary | ICD-10-CM | POA: Insufficient documentation

## 2022-02-08 DIAGNOSIS — I10 Essential (primary) hypertension: Secondary | ICD-10-CM

## 2022-02-08 DIAGNOSIS — E7800 Pure hypercholesterolemia, unspecified: Secondary | ICD-10-CM

## 2022-02-08 DIAGNOSIS — J810 Acute pulmonary edema: Secondary | ICD-10-CM

## 2022-02-08 DIAGNOSIS — I5030 Unspecified diastolic (congestive) heart failure: Secondary | ICD-10-CM

## 2022-02-08 DIAGNOSIS — E1129 Type 2 diabetes mellitus with other diabetic kidney complication: Secondary | ICD-10-CM

## 2022-02-08 DIAGNOSIS — I480 Paroxysmal atrial fibrillation: Secondary | ICD-10-CM

## 2022-02-08 LAB — COMPREHENSIVE METABOLIC PANEL
ALT: 38 U/L (ref 0–55)
AST (SGOT): 48 U/L — ABNORMAL HIGH (ref 5–41)
Albumin/Globulin Ratio: 1.1 (ref 0.9–2.2)
Albumin: 3.5 g/dL (ref 3.5–5.0)
Alkaline Phosphatase: 124 U/L — ABNORMAL HIGH (ref 37–117)
Anion Gap: 6 (ref 5.0–15.0)
BUN: 25 mg/dL — ABNORMAL HIGH (ref 7.0–21.0)
Bilirubin, Total: 0.4 mg/dL (ref 0.2–1.2)
CO2: 26 mEq/L (ref 17–29)
Calcium: 8.3 mg/dL — ABNORMAL LOW (ref 8.5–10.5)
Chloride: 108 mEq/L (ref 99–111)
Creatinine: 1.4 mg/dL — ABNORMAL HIGH (ref 0.4–1.0)
Globulin: 3.3 g/dL (ref 2.0–3.6)
Glucose: 139 mg/dL — ABNORMAL HIGH (ref 70–100)
Potassium: 4.7 mEq/L (ref 3.5–5.3)
Protein, Total: 6.8 g/dL (ref 6.0–8.3)
Sodium: 140 mEq/L (ref 135–145)
eGFR: 41.4 mL/min/{1.73_m2} — AB (ref >=60)

## 2022-02-08 LAB — LIPID PANEL
Cholesterol / HDL Ratio: 2.6 Index
Cholesterol: 102 mg/dL (ref 0–199)
HDL: 40 mg/dL (ref 40–9999)
LDL Calculated: 44 mg/dL (ref 0–99)
Triglycerides: 92 mg/dL (ref 34–149)
VLDL Calculated: 18 mg/dL (ref 10–40)

## 2022-02-08 LAB — CBC AND DIFFERENTIAL
Absolute NRBC: 0 10*3/uL (ref 0.00–0.00)
Basophils Absolute Automated: 0.04 10*3/uL (ref 0.00–0.08)
Basophils Automated: 0.3 %
Eosinophils Absolute Automated: 0.3 10*3/uL (ref 0.00–0.44)
Eosinophils Automated: 2.5 %
Hematocrit: 38.1 % (ref 34.7–43.7)
Hgb: 12.2 g/dL (ref 11.4–14.8)
Immature Granulocytes Absolute: 0.03 10*3/uL (ref 0.00–0.07)
Immature Granulocytes: 0.2 %
Instrument Absolute Neutrophil Count: 8.65 10*3/uL — ABNORMAL HIGH (ref 1.10–6.33)
Lymphocytes Absolute Automated: 2.06 10*3/uL (ref 0.42–3.22)
Lymphocytes Automated: 17.1 %
MCH: 29.5 pg (ref 25.1–33.5)
MCHC: 32 g/dL (ref 31.5–35.8)
MCV: 92.3 fL (ref 78.0–96.0)
MPV: 11.6 fL (ref 8.9–12.5)
Monocytes Absolute Automated: 0.99 10*3/uL — ABNORMAL HIGH (ref 0.21–0.85)
Monocytes: 8.2 %
Neutrophils Absolute: 8.65 10*3/uL — ABNORMAL HIGH (ref 1.10–6.33)
Neutrophils: 71.7 %
Nucleated RBC: 0 /100 WBC (ref 0.0–0.0)
Platelets: 188 10*3/uL (ref 142–346)
RBC: 4.13 10*6/uL (ref 3.90–5.10)
RDW: 13 % (ref 11–15)
WBC: 12.07 10*3/uL — ABNORMAL HIGH (ref 3.10–9.50)

## 2022-02-08 LAB — URINALYSIS WITH REFLEX TO MICROSCOPIC EXAM - REFLEX TO CULTURE
Bilirubin, UA: NEGATIVE
Glucose, UA: >=1000 — AB
Ketones UA: NEGATIVE
Nitrite, UA: POSITIVE — AB
Protein, UR: NEGATIVE
Specific Gravity UA: 1.029 (ref 1.001–1.035)
Urine pH: 5.5 (ref 5.0–8.0)
Urobilinogen, UA: NORMAL mg/dL

## 2022-02-08 LAB — HEMOLYSIS INDEX(SOFT): Hemolysis Index: 1 Index (ref 0–24)

## 2022-02-08 LAB — HEMOGLOBIN A1C
Average Estimated Glucose: 171.4 mg/dL
Hemoglobin A1C: 7.6 % — ABNORMAL HIGH (ref 4.6–5.6)

## 2022-02-08 LAB — URINE MICROALBUMIN, RANDOM
Urine Creatinine, Random: 113.3 mg/dL
Urine Microalbumin, Random: 28 ug/ml (ref 0.0–30.0)
Urine Microalbumin/Creatinine Ratio: 25 ug/mg (ref 0–30)

## 2022-02-08 MED ORDER — NIFEDIPINE ER OSMOTIC RELEASE 30 MG PO TB24
30.0000 mg | ORAL_TABLET | Freq: Every day | ORAL | 0 refills | Status: DC
Start: 2022-02-08 — End: 2022-08-23

## 2022-02-08 NOTE — Progress Notes (Signed)
[facility] VISIT           [name]  is a 67 y.o.  female.  HPI  67 y/o F with HTN, DM, Diastolic CHF f/b cardiologist Dr Mauri Reading, CKD 3b, Afib on Eliquis among others here for follow up     [employer] Spanish interpreter used [id number]    Back pain on the right side radiates towards her right side of abdomen  Onset- 6 month ago  No urinary sytm  No kidney stones, no constipation, denies N/V/D/C  Not a/w eating        Complaining of feeling tired when walking  Over past 3 weeks  Can only walk Half a block before she feels tired  Knees get weak, she is using a cane  Denies shortness of breath, orthopnea, lower ext edema  No overt sign of bleeding           Review of Systems   Constitutional:  Positive for fatigue. Negative for activity change and fever.   Respiratory:  Negative for shortness of breath.    Cardiovascular:  Negative for chest pain.   Gastrointestinal:  Positive for abdominal pain. Negative for constipation, diarrhea and nausea.   Neurological:  Negative for light-headedness and headaches.        BP 125/65 (BP Site: Right arm, Patient Position: Sitting, Cuff Size: Medium)   Pulse 83   Temp 97.5 F (36.4 C) (Temporal)   Ht 1.62 m (5' 3.78")   Wt 84.8 kg (187 lb)   SpO2 94%   BMI 32.32 kg/m    Physical Exam  Constitutional:       General: She is not in acute distress.     Appearance: Normal appearance. She is obese. She is not ill-appearing or toxic-appearing.   HENT:      Head: Normocephalic and atraumatic.   Cardiovascular:      Rate and Rhythm: Normal rate and regular rhythm.      Heart sounds: Normal heart sounds. No murmur heard.     No gallop.   Pulmonary:      Effort: Pulmonary effort is normal. No respiratory distress.      Breath sounds: Normal breath sounds. No stridor. No wheezing.   Abdominal:      General: Abdomen is flat. Bowel sounds are normal. There is no distension.      Palpations: Abdomen is soft.   Musculoskeletal:         General: Normal range of motion.   Skin:      General: Skin is warm and dry.      Capillary Refill: Capillary refill takes less than 2 seconds.   Neurological:      General: No focal deficit present.      Mental Status: She is alert and oriented to person, place, and time. Mental status is at baseline.   Psychiatric:         Mood and Affect: Mood normal.         Behavior: Behavior normal.         Thought Content: Thought content normal.          1. Right flank pain  - US Abdomen Complete; Future  - Urinalysis Reflex to Microscopic Exam- Reflex to Culture    2. Type 2 diabetes mellitus with microalbuminuria, with long-term current use of insulin  - Comprehensive metabolic panel  - Hemoglobin A1C  - Urine Microalbumin Random    3. Pure hypercholesterolemia  - Lipid  panel    4. Stage 3b chronic kidney disease  - Comprehensive metabolic panel    5. Diastolic heart failure, unspecified HF chronicity    7. Paroxysmal atrial fibrillation  - cont Eliquis  8. Other fatigue  - CBC and differential  - Comprehensive metabolic panel  - Urinalysis Reflex to Microscopic Exam- Reflex to Culture    9. Primary hypertension  - NIFEdipine XL (PROCARDIA XL) 30 MG 24 hr tablet; Take 1 tablet (30 mg) by mouth daily; Refill: 0  - cont Lisinopril, Imdur, Carvedilol  - reviewed Dr Fransisco Beau Note    10. Need for vaccination  - COVID-19 mRNA 2023-2024 vaccine 12 years and above (Moderna) 50 mcg/0.5 mL

## 2022-02-11 ENCOUNTER — Encounter (INDEPENDENT_AMBULATORY_CARE_PROVIDER_SITE_OTHER): Payer: Self-pay | Admitting: Nurse Practitioner

## 2022-02-11 ENCOUNTER — Ambulatory Visit (INDEPENDENT_AMBULATORY_CARE_PROVIDER_SITE_OTHER): Payer: Medicare (Managed Care) | Admitting: Nurse Practitioner

## 2022-02-11 VITALS — BP 171/76 | HR 76 | Resp 21 | Wt 184.4 lb

## 2022-02-11 DIAGNOSIS — Z4689 Encounter for fitting and adjustment of other specified devices: Secondary | ICD-10-CM

## 2022-02-11 DIAGNOSIS — N813 Complete uterovaginal prolapse: Secondary | ICD-10-CM

## 2022-02-11 DIAGNOSIS — N952 Postmenopausal atrophic vaginitis: Secondary | ICD-10-CM

## 2022-02-11 DIAGNOSIS — Z960 Presence of urogenital implants: Secondary | ICD-10-CM

## 2022-02-11 NOTE — Progress Notes (Signed)
Chief Complaint   Patient presents with    Pessary Check     Patient states that this pessary has remained in place, and she is taking vaginal estrogen as directed. Since the pessary was placed she has been experiencing occasional urinary leakage which she did not experience prior. She is wondering if the pain she's experiencing in her right flank is related to the pessary.        67 y.o. y/o female is here today for pessary check of her #7 long stem gelhorn pessary. She has noticed some pain in her right hip and flank. She saw her PCP who ordered a abdominal US but she hasn't yet had done. She reports that she feels the pessary moving around and it will get uncomfortable, the knob will reposition to the side. She is still hoping to have surgery, her last A1c was 7.6. She is having some urinary leakage with activity that is new with this pessary for which she is needing to wear a pad.     Previous HPI  Jazalyn Ritacco is a 67y/o G3P3 female who presents today for symptoms of prolapse that have gotten progressively worse in the last 8 months. Originally she felt a small ball in her vagina and now she feels like she has to press her uterus back up into her pelvis. She has no issues with urinating and denies any pain. She is not currently sexually active and has had no issues with intercourse in the past. 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 She was  initially fit with a #6 ring with support pessary for management of her stage 3 prolapse. She reports that it kept falling out so she stopped using. She originally was planning on surgical repair of her prolapse (colpocliesis) but it was cancelled due to her A1c being too high. She has been uncomfortable due to the prolapse, feels as if she is not able to fully empty her bladder.    History of Present Illness    Pelvic Organ Prolapse:  For symptoms patient reports:   No vaginal bulging   Yes vaginal pressure when knob  shifts  No vaginal heaviness  is using a pessary  No vaginal bleeding   Yes vaginal discharge   No sexually active   Yes using vaginal estrogen     Genitourinary:  For bladder symptoms patient reports:    Yes  urge incontinence      Yes  urinary urgency   No  urinary frequency  Yes SUI     For voiding symptoms patient reports:   No difficulty urinating    No UTIs  No dysuria   No hematuria.    Colorectal  For symptoms patient reports:   No constipation     No fecal incontinence  No rectal urgency    Review of Systems:  No changes to her ROS since last visit except as referenced in the HPI    Physical Exam:    Chaperone: Johanna R, MA present during exam     Constitutional:   General Appearance: HEALTHY-APPEARING  Level of Distress: NAD  Ambulation: AMBULATING NORMALLY     Psychiatric:  Mental Status: Normal Mood and Normal Affect  Orientation: to person, to place, and to time    Lungs:  Respiratory effort: No  dyspnea    Female Genitalia:  External:   No  masses,   No  atrophy  No  erythema  Bladder/Urethra:   Yes  normal meatus  No caruncle     Rectum:   Yes normal perianal skin     Procedure Documentation:    Pessary Check:    Risks and benefits of pessary check procedure were explained including but not limited to: bleeding, infection, damage to organs, and changes to bowel and/or bladder control. Pessary management, available options for vaginal estrogen replacement, symptoms suggesting ulceration or pessary-related infection, techniques for self-removal and repositioning of the device, and alternatives to pessary devices for the management of pelvic prolapse were discussed. Options to prevent vaginal irritation were discussed including vaginal estrogen, systemic hormone therapy, osphena, trimosan, or no treatment.  All the patient's questions were answered and patient consented to procedure.    For pessary check, pessary was removed without incident and cleaned.  Vagina examined without pessary using a  speculum.    Findings:  Assessment of pessary location: no  Pressure points:No   Ulceration: Yes   Abnormal discharge: No   Abrasions: No     Pessary type: Gelhorn LS  Size: # 7    Measurement of post-voiding residual urine and/or bladder capacity of ultrasound, non-imaging: The post-void residual urine study performed by ultrasound was documented per nursing notes:  0 mL    Assessment:    1. Complete uterovaginal prolapse        2. Vaginal pessary in situ        3. Encounter for pessary maintenance        4. Vaginal atrophy             Plan:    Prolapse/Pessary  Discussed trying a cube pessary to see if that was more comfortable but patient declined. This is the most comfortable pessary she has tried yet. She is still planning on surgery and working at getting her a1c down.  She will RTC in 3 months for pessary care or sooner with vaginal bleeding, pelvic pain, difficulty voiding or defecating.    Atrophy  Consider use of vaginal estrogen cream.      I spent 10 minutes of face-to-face time with the patient on history and exam, discussion of prior testing, and counseling/education as in the above assessment documentation, as well as an additional 5 minutes of non-face-to-face time on the same day as the encounter on visit preparation, documentation, and test ordering to care for this new patient, excluding any procedures performed

## 2022-02-15 ENCOUNTER — Telehealth (INDEPENDENT_AMBULATORY_CARE_PROVIDER_SITE_OTHER): Payer: Self-pay | Admitting: Internal Medicine

## 2022-02-15 ENCOUNTER — Ambulatory Visit (INDEPENDENT_AMBULATORY_CARE_PROVIDER_SITE_OTHER): Payer: Medicare (Managed Care) | Admitting: Internal Medicine

## 2022-02-15 ENCOUNTER — Other Ambulatory Visit (INDEPENDENT_AMBULATORY_CARE_PROVIDER_SITE_OTHER): Payer: Self-pay | Admitting: Internal Medicine

## 2022-02-15 DIAGNOSIS — N390 Urinary tract infection, site not specified: Secondary | ICD-10-CM

## 2022-02-15 MED ORDER — AMOXICILLIN-POT CLAVULANATE 875-125 MG PO TABS
1.0000 | ORAL_TABLET | Freq: Two times a day (BID) | ORAL | 0 refills | Status: AC
Start: 2022-02-15 — End: 2022-02-22

## 2022-02-15 NOTE — Telephone Encounter (Signed)
Patient called requesting refills for the following medications:     - isosorbide mononitrate (IMDUR) 30 MG 24 hr tablet   - insulin NPH (NovoLIN N) 100 UNIT/ML injection   - apixaban (Eliquis) 5 MG   - Jardiance (did not see on med list)   - lisinopril (ZESTRIL) 40 MG tablet   - NIFEdipine XL (PROCARDIA XL) 30 MG 24 hr tablet   - bumetanide (BUMEX) 1 MG tablet       To be sent to [facility]

## 2022-02-24 ENCOUNTER — Encounter (INDEPENDENT_AMBULATORY_CARE_PROVIDER_SITE_OTHER): Payer: Self-pay | Admitting: Internal Medicine

## 2022-02-25 ENCOUNTER — Ambulatory Visit: Payer: Medicare (Managed Care)

## 2022-03-09 ENCOUNTER — Ambulatory Visit: Payer: Medicare (Managed Care) | Attending: Internal Medicine

## 2022-03-09 DIAGNOSIS — Z780 Asymptomatic menopausal state: Secondary | ICD-10-CM | POA: Insufficient documentation

## 2022-03-31 NOTE — Progress Notes (Signed)
Chief Complaint   Patient presents with    Pessary Check     Patient complains of discomfort and spotting with her pessary.       67 y.o. y/o female is here today for pessary check of her #7 long stem gelhorn pessary.  It remains uncomfortable for her. She is now open to trying a different size. Ultimately she just wishes to have surgery.    Previous HPI  [name] is a 67y/o G3P3 female who presents today for symptoms of prolapse that have gotten progressively worse in the last 8 months. Originally she felt a small ball in her vagina and now she feels like she has to press her uterus back up into her pelvis. She has no issues with urinating and denies any pain. She is not currently sexually active and has had no issues with intercourse in the past. 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 She was  initially fit with a #6 ring with support pessary for management of her stage 3 prolapse. She reports that it kept falling out so she stopped using. She originally was planning on surgical repair of her prolapse (colpocliesis) but it was cancelled due to her A1c being too high. She has been uncomfortable due to the prolapse, feels as if she is not able to fully empty her bladder.    Review of Systems  No changes to her ROS since last visit except as referenced in the HPI    Physical Exam    Urogynecology Exam    Chaperone: Johanna R, MA present during exam     Constitutional   General Appearance: HEALTHY-APPEARING  Level of Distress:   Ambulation: AMBULATING NORMALLY     Psychiatric  Mental Status: Normal Mood and Normal Affect  Orientation: to person, to place, and to time    Lungs  Respiratory effort:   No  dyspnea    Female Genitalia  External:   No  masses   Yes  atrophy  No  erythema     Bladder/Urethra:   Yes  normal meatus     Procedure Documentation:    Pessary Check:    Risks and benefits of pessary check procedure were explained including but not  limited to: bleeding, infection, damage to organs, and changes to bowel and/or bladder control. Pessary management, available options for vaginal estrogen replacement, symptoms suggesting ulceration or pessary-related infection, techniques for self-removal and repositioning of the device, and alternatives to pessary devices for the management of pelvic prolapse were discussed. Options to prevent vaginal irritation were discussed including vaginal estrogen, systemic hormone therapy, osphena, trimosan, or no treatment.  All the patient's questions were answered and patient consented to procedure.    For pessary check, pessary was removed without incident and cleaned.  Vagina examined without pessary using a speculum.    Findings:  Assessment of pessary location: atrophic  Pressure points:Yes   Ulceration: No   Abnormal discharge: No   Abrasions: No     Procedure Documentation    Pessary Insertion    Risks and benefits of pessary insertion procedure were explained including but not limited to: bleeding, infection, damage to organs, and changes to bowel and/or bladder control. Pessary management, available options for vaginal estrogen replacement, symptoms suggesting ulceration or pessary-related infection, techniques for self-removal and repositioning of the device, and alternatives to pessary devices for the management of pelvic prolapse were discussed. Options to prevent vaginal irritation were  discussed including vaginal estrogen, systemic hormone therapy, osphena, trimosan, or no treatment.    All the patient's questions were answered and patient consented to procedure.    Pessary insertion completed without complications, patient tolerated well.    1. Type: Cube  Size: # 5  comfortable    Assessment:    1. Complete uterovaginal prolapse        2. Vaginal pessary in situ        3. Encounter for pessary maintenance        4. Vaginal atrophy          Plan:    She was fit with #5 cube pessary today and tolerated the  procedure well. Unfortunately we do not have in stock and will have to order. Offered to try a size bigger or smaller since we have in office but patient declined. Per patient request, #7 glehorn replaced for use until we are able to insert the #6 cube pessary.   Continue to work on diabetic management as she will not be candidate for surgery until her A1C is < 7

## 2022-04-01 ENCOUNTER — Ambulatory Visit (INDEPENDENT_AMBULATORY_CARE_PROVIDER_SITE_OTHER): Payer: Medicare PPO | Admitting: Nurse Practitioner

## 2022-04-01 ENCOUNTER — Encounter (INDEPENDENT_AMBULATORY_CARE_PROVIDER_SITE_OTHER): Payer: Self-pay | Admitting: Nurse Practitioner

## 2022-04-01 VITALS — BP 172/86 | HR 69 | Temp 98.1°F | Resp 16 | Wt 179.2 lb

## 2022-04-01 DIAGNOSIS — Z4689 Encounter for fitting and adjustment of other specified devices: Secondary | ICD-10-CM

## 2022-04-01 DIAGNOSIS — N952 Postmenopausal atrophic vaginitis: Secondary | ICD-10-CM

## 2022-04-01 DIAGNOSIS — N813 Complete uterovaginal prolapse: Secondary | ICD-10-CM

## 2022-04-01 DIAGNOSIS — Z960 Presence of urogenital implants: Secondary | ICD-10-CM

## 2022-04-08 ENCOUNTER — Ambulatory Visit (INDEPENDENT_AMBULATORY_CARE_PROVIDER_SITE_OTHER): Payer: Medicare PPO | Admitting: Nurse Practitioner

## 2022-04-14 ENCOUNTER — Encounter (INDEPENDENT_AMBULATORY_CARE_PROVIDER_SITE_OTHER): Payer: Self-pay

## 2022-04-22 ENCOUNTER — Encounter (INDEPENDENT_AMBULATORY_CARE_PROVIDER_SITE_OTHER): Payer: Self-pay | Admitting: Internal Medicine

## 2022-05-12 ENCOUNTER — Encounter (INDEPENDENT_AMBULATORY_CARE_PROVIDER_SITE_OTHER): Payer: Self-pay | Admitting: Nurse Practitioner

## 2022-05-13 ENCOUNTER — Ambulatory Visit (INDEPENDENT_AMBULATORY_CARE_PROVIDER_SITE_OTHER): Payer: Medicare PPO | Admitting: Nurse Practitioner

## 2022-05-23 ENCOUNTER — Encounter (INDEPENDENT_AMBULATORY_CARE_PROVIDER_SITE_OTHER): Payer: Self-pay | Admitting: Nurse Practitioner

## 2022-05-23 ENCOUNTER — Ambulatory Visit (INDEPENDENT_AMBULATORY_CARE_PROVIDER_SITE_OTHER): Payer: Medicare PPO | Admitting: Nurse Practitioner

## 2022-05-23 VITALS — BP 122/69 | HR 77 | Temp 98.2°F | Resp 18 | Wt 174.5 lb

## 2022-05-23 DIAGNOSIS — N813 Complete uterovaginal prolapse: Secondary | ICD-10-CM

## 2022-05-23 DIAGNOSIS — Z4689 Encounter for fitting and adjustment of other specified devices: Secondary | ICD-10-CM

## 2022-05-23 DIAGNOSIS — Z960 Presence of urogenital implants: Secondary | ICD-10-CM

## 2022-05-23 DIAGNOSIS — N952 Postmenopausal atrophic vaginitis: Secondary | ICD-10-CM

## 2022-05-23 NOTE — Progress Notes (Signed)
Chief Complaint   Patient presents with    Pessary Check     Patient presents for a pessary check and fitting for a cube pessary. States she occasionally has bloody discharge with her current pessary in place, but has not seen any blood for the last few days.          67 y.o. y/o female is here today for pessary check of her #7 gelhorn and to place a #6 cube pessary. She finds the Gelhorn to be uncomfortable.     Previous HPI  [name] is a 67y/o G3P3 female who presents today for symptoms of prolapse that have gotten progressively worse in the last 8 months. Originally she felt a small ball in her vagina and now she feels like she has to press her uterus back up into her pelvis. She has no issues with urinating and denies any pain. She is not currently sexually active and has had no issues with intercourse in the past. 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 She was  initially fit with a #6 ring with support pessary for management of her stage 3 prolapse. She reports that it kept falling out so she stopped using. She originally was planning on surgical repair of her prolapse (colpocliesis) but it was cancelled due to her A1c being too high. She has been uncomfortable due to the prolapse, feels as if she is not able to fully empty her bladder.     Review of Systems  No changes to her ROS since last visit except as referenced in the HPI    Physical Exam:    Chaperone: Johanna R, MA present during exam     Constitutional:   General Appearance: HEALTHY-APPEARING  Level of Distress: NAD  Ambulation: AMBULATING NORMALLY     Psychiatric:  Mental Status: Normal Mood and Normal Affect  Orientation: to person, to place, and to time    Lungs:  Respiratory effort: No  dyspnea    Female Genitalia:  External:   No  masses,   No  atrophy  No  erythema     Bladder/Urethra:   Yes  normal meatus     Rectum:   Yes normal perianal skin     Procedure  Documentation:    Pessary Check:    Risks and benefits of pessary check procedure were explained including but not limited to: bleeding, infection, damage to organs, and changes to bowel and/or bladder control. Pessary management, available options for vaginal estrogen replacement, symptoms suggesting ulceration or pessary-related infection, techniques for self-removal and repositioning of the device, and alternatives to pessary devices for the management of pelvic prolapse were discussed. Options to prevent vaginal irritation were discussed including vaginal estrogen, systemic hormone therapy, osphena, trimosan, or no treatment.  All the patient's questions were answered and patient consented to procedure.    For pessary check, pessary was removed without incident and cleaned.  Vagina examined without pessary using a speculum.    Findings:  Assessment of pessary location: healthy  Pressure points:Yes   Ulceration: No   Abnormal discharge: No  Abrasions: No     Pessary type: Cube  Size: # 6    Assessment:    1. Complete uterovaginal prolapse        2. Vaginal pessary in situ        3. Encounter for pessary maintenance        4. Vaginal atrophy  Plan:    Prolapse/Pessary  #7 gelhorn cleaned and bagged for patient, #6 cube pessary placed. She denies discomfort. Discussed increased risk for erosion with the cube pessary  She will RTC in 2 weeks for pessary care or sooner with vaginal bleeding, pelvic pain, difficulty voiding or defecating.    Vaginal Atrophy  Continue vaginal estrogen cream twice weekly    I spent 10 minutes of face-to-face time with the patient on history and exam, discussion of prior testing, and counseling/education as in the above assessment documentation, as well as an additional 5 minutes of non-face-to-face time on the same day as the encounter on visit preparation, documentation, and test ordering to care for this new patient, excluding any procedures performed

## 2022-05-24 ENCOUNTER — Encounter (INDEPENDENT_AMBULATORY_CARE_PROVIDER_SITE_OTHER): Payer: Self-pay | Admitting: Internal Medicine

## 2022-05-24 ENCOUNTER — Ambulatory Visit (INDEPENDENT_AMBULATORY_CARE_PROVIDER_SITE_OTHER): Payer: Medicare PPO | Admitting: Internal Medicine

## 2022-05-24 VITALS — BP 155/57 | HR 69 | Temp 97.6°F | Wt 174.0 lb

## 2022-05-24 DIAGNOSIS — I5030 Unspecified diastolic (congestive) heart failure: Secondary | ICD-10-CM

## 2022-05-24 DIAGNOSIS — M545 Low back pain, unspecified: Secondary | ICD-10-CM

## 2022-05-24 DIAGNOSIS — N1832 Chronic kidney disease, stage 3b: Secondary | ICD-10-CM

## 2022-05-24 DIAGNOSIS — I10 Essential (primary) hypertension: Secondary | ICD-10-CM

## 2022-05-24 DIAGNOSIS — R809 Proteinuria, unspecified: Secondary | ICD-10-CM

## 2022-05-24 DIAGNOSIS — Z794 Long term (current) use of insulin: Secondary | ICD-10-CM

## 2022-05-24 DIAGNOSIS — I480 Paroxysmal atrial fibrillation: Secondary | ICD-10-CM

## 2022-05-24 DIAGNOSIS — Q782 Osteopetrosis: Secondary | ICD-10-CM

## 2022-05-24 DIAGNOSIS — M5441 Lumbago with sciatica, right side: Secondary | ICD-10-CM

## 2022-05-24 DIAGNOSIS — E7800 Pure hypercholesterolemia, unspecified: Secondary | ICD-10-CM

## 2022-05-24 DIAGNOSIS — E1129 Type 2 diabetes mellitus with other diabetic kidney complication: Secondary | ICD-10-CM

## 2022-05-24 DIAGNOSIS — G8929 Other chronic pain: Secondary | ICD-10-CM

## 2022-05-24 LAB — HEMOLYSIS INDEX(SOFT): Hemolysis Index: 6 Index (ref 0–24)

## 2022-05-24 LAB — LIPID PANEL
Cholesterol / HDL Ratio: 3.5 {index}
Cholesterol: 138 mg/dL (ref 0–199)
HDL: 40 mg/dL (ref 40–9999)
LDL Calculated: 78 mg/dL (ref 0–99)
Triglycerides: 102 mg/dL (ref 34–149)
VLDL Calculated: 20 mg/dL (ref 10–40)

## 2022-05-24 LAB — COMPREHENSIVE METABOLIC PANEL
ALT: 19 U/L (ref 0–55)
AST (SGOT): 19 U/L (ref 5–41)
Albumin/Globulin Ratio: 0.9 (ref 0.9–2.2)
Albumin: 3.5 g/dL (ref 3.5–5.0)
Alkaline Phosphatase: 117 U/L (ref 37–117)
Anion Gap: 6 (ref 5.0–15.0)
BUN: 27 mg/dL — ABNORMAL HIGH (ref 7.0–21.0)
Bilirubin, Total: 0.5 mg/dL (ref 0.2–1.2)
CO2: 27 mEq/L (ref 17–29)
Calcium: 9 mg/dL (ref 8.5–10.5)
Chloride: 105 mEq/L (ref 99–111)
Creatinine: 1 mg/dL (ref 0.4–1.0)
Globulin: 3.8 g/dL — ABNORMAL HIGH (ref 2.0–3.6)
Glucose: 122 mg/dL — ABNORMAL HIGH (ref 70–100)
Potassium: 4.6 mEq/L (ref 3.5–5.3)
Protein, Total: 7.3 g/dL (ref 6.0–8.3)
Sodium: 138 mEq/L (ref 135–145)
eGFR: >60 mL/min/{1.73_m2} (ref >=60)

## 2022-05-24 LAB — HEMOGLOBIN A1C
Average Estimated Glucose: 171.4 mg/dL
Hemoglobin A1C: 7.6 % — ABNORMAL HIGH (ref 4.6–5.6)

## 2022-05-24 LAB — VITAMIN D, 25 OH, TOTAL: Vitamin D, 25 OH, Total: 27 ng/mL — ABNORMAL LOW (ref 30–100)

## 2022-05-24 NOTE — Progress Notes (Signed)
[facility]ICE VISIT           [name]  is a 67 y.o.  female.  HPI    67 y/o female PMH HTN, DM, HFpEF, CKD3b, Afib on Eliquis p/w multiple complaints. Seen in clinic Jan 2024 for multiple complaints including back pain and weakness at that time with a UTI treated.    Video Spanish Interpretor used [phone]    Has been following with Uro, recent pessary change. Improved prolapse sxs but still with some back pain.    Finished abx for UTI. Did not help with pain but helped with weakness.    T2DM   A1c 7.6% 02/08/22  On NPH 25 U qAM + 40 U qPM; jardiance    HLD   Last lipid panel 02/08/22: HDL 40 LDL 44, VLDL 18  On lipitor tolerating well    CKD3b   CMP 02/08/22 with Cr 1.4    HFpEF   On Bumex alternating 2mg /1mg    On Coreg 25 BID    AF   On Eliquis    HTN   On Lisinopril 40 qD  Started Nifedipine last time  BP at home usually 118-120's    Review of Systems   Constitutional:  Negative for chills, fatigue and fever.   HENT:  Negative for congestion and sore throat.    Respiratory:  Negative for cough, chest tightness and shortness of breath.    Cardiovascular:  Negative for chest pain and leg swelling.   Gastrointestinal:  Negative for abdominal distention, abdominal pain, blood in stool, constipation, nausea and vomiting.   Musculoskeletal:  Positive for back pain. Negative for arthralgias and myalgias.   Skin:  Negative for rash.   Neurological:  Negative for weakness, numbness and headaches.   All other systems reviewed and are negative.       BP 155/57 (BP Site: Left arm)   Pulse 69   Temp 97.6 F (36.4 C)   Wt 78.9 kg (174 lb)   SpO2 99%   BMI 30.07 kg/m    Physical Exam  Vitals and nursing note reviewed.   Constitutional:       General: She is not in acute distress.     Appearance: Normal appearance. She is not ill-appearing.   HENT:      Head: Normocephalic and atraumatic.      Nose: Nose normal.      Mouth/Throat:      Mouth: Mucous membranes are moist.      Pharynx: Oropharynx is clear.    Eyes:      Extraocular Movements: Extraocular movements intact.      Pupils: Pupils are equal, round, and reactive to light.   Cardiovascular:      Rate and Rhythm: Normal rate and regular rhythm.      Heart sounds: Normal heart sounds. No murmur heard.     No friction rub. No gallop.   Pulmonary:      Effort: Pulmonary effort is normal. No respiratory distress.      Breath sounds: Normal breath sounds. No wheezing, rhonchi or rales.   Abdominal:      General: There is no distension.      Palpations: Abdomen is soft.      Tenderness: There is no abdominal tenderness. There is no guarding or rebound.   Musculoskeletal:         General: Normal range of motion.      Cervical back: Normal range of motion and neck  supple.      Right lower leg: No edema.      Left lower leg: No edema.   Skin:     General: Skin is warm.      Capillary Refill: Capillary refill takes less than 2 seconds.   Neurological:      General: No focal deficit present.      Mental Status: She is alert and oriented to person, place, and time.   Psychiatric:         Mood and Affect: Mood normal.         Behavior: Behavior normal.          1. Lumbar back pain  - Ambulatory referral to Physical Therapy; Future    2. Type 2 diabetes mellitus with microalbuminuria, with long-term current use of insulin  - Hemoglobin A1C  - Comprehensive metabolic panel    3. Diastolic heart failure, unspecified HF chronicity    4. Stage 3b chronic kidney disease    5. Paroxysmal atrial fibrillation    6. Pure hypercholesterolemia  - Lipid panel  - Comprehensive metabolic panel    7. Primary hypertension    8. Osteopetrosis  - Vitamin D,25 OH, Total    9. Chronic right-sided low back pain with right-sided sciatica  - Referral to Physical Therapy-[facility]; Future      T2DM   - A1c 7.6% 02/08/22  - Continue NPH 25 U qAM + 40 U qPM; jardiance  - Repeat A1c today     HLD   - Last lipid panel 02/08/22: HDL 40 LDL 44, VLDL 18  - Continue Lipitor  - Repeat    CKD3b   - CMP  02/08/22 with Cr 1.4  - Repeat today    HFpEF   - Continue Bumex 1 mg BID MWF  - Continue Coreg 25 BID  - CMP per above    AF   - Continue Eliquis, Coreg 25 BID    HTN   - Continue Lisinopril 40 qD  - Started on Procardia 30 mg qD in January, since then BP at home has been 110's-120's; favor component of white coat    Osteoporosis  - DXA scan Tscore -3.4  - Order Vit D  - Will f/u dentist (has hx of dental issues including dentures) prior to bisphos treatment  - No hx of dysphagia    No follow-ups on file.     Sofie Hartigan, MD  Internal Medicine PGY-3

## 2022-05-25 ENCOUNTER — Telehealth (INDEPENDENT_AMBULATORY_CARE_PROVIDER_SITE_OTHER): Payer: Self-pay

## 2022-05-25 NOTE — Telephone Encounter (Signed)
Zarro, pharmacist from [facility], states he was reviewing pt's medications and per guidelines, pt is recommended to be on a high intensity statin (or moderate intensity if unable to tolerate high) due to her ascvd risk and diabetes. RN will call [employer] back at [phone] once Dr. Mervin Kung can decide if she would like to place pt on statin based on the guidelines. Please advise, thank you

## 2022-05-25 NOTE — Telephone Encounter (Signed)
Updated [facility] pharmacy.

## 2022-05-26 ENCOUNTER — Telehealth (INDEPENDENT_AMBULATORY_CARE_PROVIDER_SITE_OTHER): Payer: Self-pay | Admitting: Internal Medicine

## 2022-05-26 NOTE — Telephone Encounter (Signed)
Pt be notified of the result. Pt verbalized understanding no question at this time.

## 2022-06-13 ENCOUNTER — Ambulatory Visit (INDEPENDENT_AMBULATORY_CARE_PROVIDER_SITE_OTHER): Payer: Medicare PPO | Admitting: Nurse Practitioner

## 2022-06-13 ENCOUNTER — Encounter (INDEPENDENT_AMBULATORY_CARE_PROVIDER_SITE_OTHER): Payer: Self-pay | Admitting: Nurse Practitioner

## 2022-06-13 VITALS — BP 144/68 | HR 57 | Temp 98.1°F | Resp 18 | Wt 178.0 lb

## 2022-06-13 DIAGNOSIS — N813 Complete uterovaginal prolapse: Secondary | ICD-10-CM

## 2022-06-13 DIAGNOSIS — N952 Postmenopausal atrophic vaginitis: Secondary | ICD-10-CM

## 2022-06-13 DIAGNOSIS — Z4689 Encounter for fitting and adjustment of other specified devices: Secondary | ICD-10-CM

## 2022-06-13 DIAGNOSIS — Z960 Presence of urogenital implants: Secondary | ICD-10-CM

## 2022-06-13 NOTE — Progress Notes (Signed)
Chief Complaint   Patient presents with    Pessary Check     Patient presents for pessary check. Denies any vaginal bleeding or pain associated with the pessary, states that it feels more comfortable than the Gellhorn pessary she used prior.        67 y.o. y/o female is here today for pessary check of her #6 cube pessary. She reports that this pessary is significantly more comfortable than the gelhorn pessary she has used in the past.     Previous HPI  [name] is a 67y/o G3P3 female who presents today for symptoms of prolapse that have gotten progressively worse in the last 8 months. Originally she felt a small ball in her vagina and now she feels like she has to press her uterus back up into her pelvis. She has no issues with urinating and denies any pain. She is not currently sexually active and has had no issues with intercourse in the past. 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 She was  initially fit with a #6 ring with support pessary for management of her stage 3 prolapse. She reports that it kept falling out so she stopped using. She originally was planning on surgical repair of her prolapse (colpocliesis) but it was cancelled due to her A1c being too high. She has been uncomfortable due to the prolapse, feels as if she is not able to fully empty her bladder.     History of Present Illness    Pelvic Organ Prolapse:  For symptoms patient reports:   No vaginal bulging   No vaginal pressure   No vaginal heaviness  is using a pessary  No vaginal bleeding   Yes vaginal discharge   No sexually active   Yes using vaginal estrogen     Genitourinary:  For bladder symptoms patient reports:    No  urge incontinence      No  urinary urgency   No  urinary frequency  No SUI     For voiding symptoms patient reports:   No difficulty urinating    No UTIs  No dysuria   No hematuria.    Colorectal  For symptoms patient reports:   No constipation     No fecal  incontinence  No rectal urgency    Review of Systems:  No changes to her ROS since last visit except as referenced in the HPI    Physical Exam:    Chaperone: Johanna R, MA present during exam     Constitutional:   General Appearance: HEALTHY-APPEARING  Level of Distress: NAD  Ambulation: AMBULATING NORMALLY     Psychiatric:  Mental Status: Normal Mood and Normal Affect  Orientation: to person, to place, and to time    Lungs:  Respiratory effort: No  dyspnea    Female Genitalia:  External:   No  masses,   No  atrophy  No  erythema     Bladder/Urethra:   Yes  normal meatus     Rectum:   Yes normal perianal skin     Procedure Documentation:    Pessary Check:    Risks and benefits of pessary check procedure were explained including but not limited to: bleeding, infection, damage to organs, and changes to bowel and/or bladder control. Pessary management, available options for vaginal estrogen replacement, symptoms suggesting ulceration or pessary-related infection, techniques for self-removal and repositioning of the device, and alternatives to pessary devices for  the management of pelvic prolapse were discussed. Options to prevent vaginal irritation were discussed including vaginal estrogen, systemic hormone therapy, osphena, trimosan, or no treatment.  All the patient's questions were answered and patient consented to procedure.    For pessary check, pessary was removed without incident and cleaned.  Vagina examined without pessary using a speculum.    Findings:  Assessment of pessary location: atrophic  Pressure points:Yes   Ulceration: Yes   Abnormal discharge: bleeding noted   Abrasions: Yes Location: right vaginal wall  Granulation tissue: Yes Location:right vaginal wall  Treated area with: Silver nitrate    Pessary type: Cube  Size: # 6    Assessment:    1. Complete uterovaginal prolapse        2. Vaginal pessary in situ        3. Encounter for pessary maintenance        4. Vaginal atrophy              Plan:    Prolapse/Pessary  Erosion noted with the cube pessary, treated with silver nitrate. Unsure that she is a candidate for a cube pessary due to the development of an erosion with just a couple weeks of use.   She has found all other types of pessaries tried to be ineffective or uncomfortable.   Will have her return to clinic to discuss her options with Dr. Othella Boyer since my attempts with a pessary have not been successful. Also discussed an external device such as the Femicushion as an option.   Encouraged her to continue to work on diabetic control. A1C remains elevated at 7.6.    Vaginal atrophy  Continue use of vaginal estrogen cream.     I spent 20 minutes of face-to-face time with the patient on history and exam, discussion of prior testing, and counseling/education as in the above assessment documentation, as well as an additional 5 minutes of non-face-to-face time on the same day as the encounter on visit preparation, documentation, and test ordering to care for this new patient, excluding any procedures performed

## 2022-06-15 ENCOUNTER — Encounter (INDEPENDENT_AMBULATORY_CARE_PROVIDER_SITE_OTHER): Payer: Self-pay

## 2022-06-22 ENCOUNTER — Encounter (INDEPENDENT_AMBULATORY_CARE_PROVIDER_SITE_OTHER): Payer: Self-pay | Admitting: Internal Medicine

## 2022-08-15 ENCOUNTER — Other Ambulatory Visit (INDEPENDENT_AMBULATORY_CARE_PROVIDER_SITE_OTHER): Payer: Self-pay | Admitting: Internal Medicine

## 2022-08-23 ENCOUNTER — Ambulatory Visit (INDEPENDENT_AMBULATORY_CARE_PROVIDER_SITE_OTHER): Payer: Medicare PPO | Admitting: Internal Medicine

## 2022-08-23 ENCOUNTER — Encounter (INDEPENDENT_AMBULATORY_CARE_PROVIDER_SITE_OTHER): Payer: Self-pay | Admitting: Internal Medicine

## 2022-08-23 VITALS — BP 137/72 | HR 66 | Temp 97.8°F | Wt 178.0 lb

## 2022-08-23 DIAGNOSIS — E1129 Type 2 diabetes mellitus with other diabetic kidney complication: Secondary | ICD-10-CM

## 2022-08-23 DIAGNOSIS — R809 Proteinuria, unspecified: Secondary | ICD-10-CM

## 2022-08-23 DIAGNOSIS — I10 Essential (primary) hypertension: Secondary | ICD-10-CM

## 2022-08-23 DIAGNOSIS — E7800 Pure hypercholesterolemia, unspecified: Secondary | ICD-10-CM

## 2022-08-23 DIAGNOSIS — Z794 Long term (current) use of insulin: Secondary | ICD-10-CM

## 2022-08-23 DIAGNOSIS — I5030 Unspecified diastolic (congestive) heart failure: Secondary | ICD-10-CM

## 2022-08-23 DIAGNOSIS — R1011 Right upper quadrant pain: Secondary | ICD-10-CM

## 2022-08-23 DIAGNOSIS — Z1231 Encounter for screening mammogram for malignant neoplasm of breast: Secondary | ICD-10-CM

## 2022-08-23 LAB — COMPREHENSIVE METABOLIC PANEL
ALT: 16 U/L (ref 0–55)
AST (SGOT): 22 U/L (ref 5–41)
Albumin/Globulin Ratio: 1 (ref 0.9–2.2)
Albumin: 3.5 g/dL (ref 3.5–5.0)
Alkaline Phosphatase: 119 U/L — ABNORMAL HIGH (ref 37–117)
Anion Gap: 7 (ref 5.0–15.0)
BUN: 27 mg/dL — ABNORMAL HIGH (ref 7–21)
Bilirubin, Total: 0.4 mg/dL (ref 0.2–1.2)
CO2: 27 mEq/L (ref 17–29)
Calcium: 9.1 mg/dL (ref 8.5–10.5)
Chloride: 109 mEq/L (ref 99–111)
Creatinine: 1.1 mg/dL — ABNORMAL HIGH (ref 0.4–1.0)
GFR: 54.8 mL/min/{1.73_m2} — ABNORMAL LOW (ref >=60.0)
Globulin: 3.5 g/dL (ref 2.0–3.6)
Glucose: 171 mg/dL — ABNORMAL HIGH (ref 70–100)
Hemolysis Index: 3 Index
Potassium: 3.8 mEq/L (ref 3.5–5.3)
Protein, Total: 7 g/dL (ref 6.0–8.3)
Sodium: 143 mEq/L (ref 135–145)

## 2022-08-23 LAB — LIPID PANEL
Cholesterol / HDL Ratio: 3.2 Index
Cholesterol: 135 mg/dL (ref <=199)
HDL: 42 mg/dL (ref >=40)
LDL Calculated: 67 mg/dL (ref 0–129)
Triglycerides: 132 mg/dL (ref 34–149)
VLDL Calculated: 26 mg/dL (ref 10–40)

## 2022-08-23 LAB — HEMOGLOBIN A1C
Average Estimated Glucose: 165.7 mg/dL
Hemoglobin A1C: 7.4 % — ABNORMAL HIGH (ref 4.6–5.6)

## 2022-08-23 MED ORDER — NIFEDIPINE ER OSMOTIC RELEASE 30 MG PO TB24
30.0000 mg | ORAL_TABLET | Freq: Every day | ORAL | Status: DC
Start: 2022-08-23 — End: 2022-09-02

## 2022-08-23 MED ORDER — NOVOLIN N 100 UNIT/ML SC SUSP
SUBCUTANEOUS | 3 refills | Status: DC
Start: 2022-08-23 — End: 2023-03-28

## 2022-08-23 MED ORDER — EMPAGLIFLOZIN 25 MG PO TABS
25.0000 mg | ORAL_TABLET | Freq: Every morning | ORAL | 2 refills | Status: DC
Start: 2022-08-23 — End: 2022-08-23

## 2022-08-23 MED ORDER — BUMETANIDE 1 MG PO TABS
ORAL_TABLET | ORAL | 3 refills | Status: DC
Start: 2022-08-23 — End: 2022-09-02

## 2022-08-23 MED ORDER — LISINOPRIL 40 MG PO TABS
40.0000 mg | ORAL_TABLET | Freq: Every day | ORAL | 1 refills | Status: DC
Start: 2022-08-23 — End: 2023-03-28

## 2022-08-23 MED ORDER — CARVEDILOL 25 MG PO TABS
25.0000 mg | ORAL_TABLET | Freq: Every day | ORAL | 1 refills | Status: DC
Start: 2022-08-23 — End: 2022-09-02

## 2022-08-23 MED ORDER — ATORVASTATIN CALCIUM 40 MG PO TABS
40.0000 mg | ORAL_TABLET | Freq: Every evening | ORAL | 3 refills | Status: DC
Start: 2022-08-23 — End: 2023-03-28

## 2022-08-23 NOTE — Progress Notes (Signed)
PRIMARY CARE   OFFICE VISIT            HPI   Chief Complaint   Patient presents with    Hypertension      HPI    67 y/o female PMH HTN, DM, HFpEF, CKD3b, Afib on Eliquis p/w multiple complaints. Seen in clinic Jan 2024 for multiple complaints including back pain and weakness at that time with a UTI treated.     Video Spanish Interpretor used [phone]     Has been following with Uro, recent pessary change. Improved prolapse sxs but still with some back pain.     Finished abx for UTI. Did not help with pain but helped with weakness.     T2DM   Most recent A1c 7.6%   On NPH 25 U qAM + 40 U qPM; jardiance  On Ace-I and Statin  She has eye exam today     HLD   On lipitor tolerating well     HFpEF   On Bumex alternating 2mg  (MWF)/1mg    On Coreg 25 BID     AF   No longer on Eliquis  Dabigatran 150 mg daily     HTN , well-controlled  On Lisinopril 40 qD  Carvedilol 25 mg once a day  Nifedipine 30 mg daily  BP at home usually 118-120's     ROS   Review of Systems   Constitutional:  Negative for activity change, appetite change, chills and fever.   Eyes:  Negative for visual disturbance.   Respiratory:  Negative for cough, chest tightness, shortness of breath and wheezing.    Cardiovascular:  Negative for chest pain, palpitations and leg swelling.   Gastrointestinal:  Positive for abdominal pain. Negative for constipation, diarrhea, nausea and vomiting.   Endocrine: Negative for polydipsia, polyphagia and polyuria.   Genitourinary:  Negative for dysuria.   Musculoskeletal:  Negative for back pain, gait problem and myalgias.   Skin:  Negative for rash.   Neurological:  Negative for dizziness.   Hematological:  Negative for adenopathy.   Psychiatric/Behavioral:  Negative for agitation.          Vital Signs   BP 137/72   Pulse 66   Temp 97.8 F (36.6 C)   Wt 80.7 kg (178 lb)   SpO2 96%   BMI 30.76 kg/m      Physical Exam   Physical Exam  Constitutional:       General: She is not in acute distress.     Appearance:  Normal appearance. She is normal weight. She is not ill-appearing or toxic-appearing.   HENT:      Head: Normocephalic and atraumatic.   Cardiovascular:      Rate and Rhythm: Normal rate and regular rhythm.      Heart sounds: Normal heart sounds. No murmur heard.     No gallop.   Pulmonary:      Effort: Pulmonary effort is normal. No respiratory distress.      Breath sounds: Normal breath sounds. No stridor. No wheezing.   Abdominal:      General: Abdomen is flat. Bowel sounds are normal. There is no distension.      Palpations: Abdomen is soft.   Musculoskeletal:         General: Normal range of motion.   Skin:     General: Skin is warm and dry.      Capillary Refill: Capillary refill takes less than 2 seconds.   Neurological:  General: No focal deficit present.      Mental Status: She is alert and oriented to person, place, and time. Mental status is at baseline.   Psychiatric:         Mood and Affect: Mood normal.         Behavior: Behavior normal.         Thought Content: Thought content normal.           Assessment/Plan   1. Essential hypertension  - lisinopril (ZESTRIL) 40 MG tablet; Take 1 tablet (40 mg) by mouth daily  Dispense: 90 tablet; Refill: 1  - Comprehensive Metabolic Panel; Future  - Comprehensive Metabolic Panel    2. Primary hypertension  - carvedilol (COREG) 25 MG tablet; Take 1 tablet (25 mg) by mouth daily  Dispense: 90 tablet; Refill: 1  - NIFEdipine XL (PROCARDIA XL) 30 MG 24 hr tablet; Take 1 tablet (30 mg) by mouth daily  - Comprehensive Metabolic Panel; Future  - Comprehensive Metabolic Panel    3. Type 2 diabetes mellitus with microalbuminuria, with long-term current use of insulin  - insulin NPH (NovoLIN N) 100 UNIT/ML injection; INJECT 25 UNITS SUBCUTANEOUSLY WITH BREAKFST AND 40  UNITS WITH DINNERI  Dispense: 60 mL; Refill: 3  - atorvastatin (LIPITOR) 40 MG tablet; Take 1 tablet (40 mg) by mouth every evening  Dispense: 90 tablet; Refill: 3  - Hemoglobin A1C; Future  - Lipid Panel;  Future  - Hemoglobin A1C  - Lipid Panel    4. Diastolic heart failure, unspecified HF chronicity  - bumetanide (BUMEX) 1 MG tablet; Take 2 mg MWF and 1 mg every other days  Dispense: 120 tablet; Refill: 3    5. Pure hypercholesterolemia  - atorvastatin (LIPITOR) 40 MG tablet; Take 1 tablet (40 mg) by mouth every evening  Dispense: 90 tablet; Refill: 3  - Lipid Panel; Future  - Lipid Panel    6. RUQ pain  - US Abdomen Complete; Future    7. Encounter for screening mammogram for malignant neoplasm of breast  - Mammo Screening 3D/Tomo Bilateral; Future

## 2022-09-02 ENCOUNTER — Inpatient Hospital Stay
Admission: EM | Admit: 2022-09-02 | Discharge: 2022-09-05 | DRG: 291 | Disposition: A | Discharge status: Home / Self Care | Payer: Medicare PPO | Attending: Student in an Organized Health Care Education/Training Program | Admitting: Student in an Organized Health Care Education/Training Program

## 2022-09-02 ENCOUNTER — Emergency Department: Payer: Medicare PPO

## 2022-09-02 DIAGNOSIS — Z6829 Body mass index (BMI) 29.0-29.9, adult: Secondary | ICD-10-CM

## 2022-09-02 DIAGNOSIS — H409 Unspecified glaucoma: Secondary | ICD-10-CM | POA: Diagnosis present

## 2022-09-02 DIAGNOSIS — D72829 Elevated white blood cell count, unspecified: Secondary | ICD-10-CM | POA: Diagnosis present

## 2022-09-02 DIAGNOSIS — I480 Paroxysmal atrial fibrillation: Secondary | ICD-10-CM | POA: Diagnosis present

## 2022-09-02 DIAGNOSIS — J810 Acute pulmonary edema: Secondary | ICD-10-CM

## 2022-09-02 DIAGNOSIS — E1122 Type 2 diabetes mellitus with diabetic chronic kidney disease: Secondary | ICD-10-CM | POA: Diagnosis present

## 2022-09-02 DIAGNOSIS — N189 Chronic kidney disease, unspecified: Secondary | ICD-10-CM | POA: Diagnosis present

## 2022-09-02 DIAGNOSIS — E663 Overweight: Secondary | ICD-10-CM | POA: Diagnosis present

## 2022-09-02 DIAGNOSIS — I071 Rheumatic tricuspid insufficiency: Secondary | ICD-10-CM | POA: Diagnosis present

## 2022-09-02 DIAGNOSIS — E669 Obesity, unspecified: Secondary | ICD-10-CM | POA: Diagnosis present

## 2022-09-02 DIAGNOSIS — E785 Hyperlipidemia, unspecified: Secondary | ICD-10-CM | POA: Diagnosis present

## 2022-09-02 DIAGNOSIS — Z79899 Other long term (current) drug therapy: Secondary | ICD-10-CM

## 2022-09-02 DIAGNOSIS — I509 Heart failure, unspecified: Principal | ICD-10-CM | POA: Diagnosis present

## 2022-09-02 DIAGNOSIS — E876 Hypokalemia: Secondary | ICD-10-CM | POA: Diagnosis present

## 2022-09-02 DIAGNOSIS — I130 Hypertensive heart and chronic kidney disease with heart failure and stage 1 through stage 4 chronic kidney disease, or unspecified chronic kidney disease: Principal | ICD-10-CM | POA: Diagnosis present

## 2022-09-02 DIAGNOSIS — I5033 Acute on chronic diastolic (congestive) heart failure: Secondary | ICD-10-CM | POA: Diagnosis present

## 2022-09-02 DIAGNOSIS — I5030 Unspecified diastolic (congestive) heart failure: Secondary | ICD-10-CM

## 2022-09-02 DIAGNOSIS — R079 Chest pain, unspecified: Secondary | ICD-10-CM

## 2022-09-02 DIAGNOSIS — Z794 Long term (current) use of insulin: Secondary | ICD-10-CM

## 2022-09-02 DIAGNOSIS — I2510 Atherosclerotic heart disease of native coronary artery without angina pectoris: Secondary | ICD-10-CM | POA: Diagnosis present

## 2022-09-02 DIAGNOSIS — E8770 Fluid overload, unspecified: Secondary | ICD-10-CM | POA: Diagnosis present

## 2022-09-02 LAB — WHOLE BLOOD GLUCOSE POCT
Whole Blood Glucose POCT: 110 mg/dL — ABNORMAL HIGH (ref 70–100)
Whole Blood Glucose POCT: 114 mg/dL — ABNORMAL HIGH (ref 70–100)

## 2022-09-02 LAB — LAB USE ONLY - CBC WITH DIFFERENTIAL
Absolute Basophils: 0.05 10*3/uL (ref 0.00–0.08)
Absolute Eosinophils: 0.27 10*3/uL (ref 0.00–0.44)
Absolute Immature Granulocytes: 0.05 10*3/uL (ref 0.00–0.07)
Absolute Lymphocytes: 1.18 10*3/uL (ref 0.42–3.22)
Absolute Monocytes: 1.02 10*3/uL — ABNORMAL HIGH (ref 0.21–0.85)
Absolute Neutrophils: 10.1 10*3/uL — ABNORMAL HIGH (ref 1.10–6.33)
Absolute nRBC: 0 10*3/uL (ref <=0.00)
Basophils %: 0.4 %
Eosinophils %: 2.1 %
Hematocrit: 43.8 % — ABNORMAL HIGH (ref 34.7–43.7)
Hemoglobin: 14.3 g/dL (ref 11.4–14.8)
Immature Granulocytes %: 0.4 %
Lymphocytes %: 9.3 %
MCH: 29.6 pg (ref 25.1–33.5)
MCHC: 32.6 g/dL (ref 31.5–35.8)
MCV: 90.7 fL (ref 78.0–96.0)
MPV: 11.2 fL (ref 8.9–12.5)
Monocytes %: 8.1 %
Neutrophils %: 79.7 %
Platelet Count: 203 10*3/uL (ref 142–346)
Preliminary Absolute Neutrophil Count: 10.1 10*3/uL — ABNORMAL HIGH (ref 1.10–6.33)
RBC: 4.83 10*6/uL (ref 3.90–5.10)
RDW: 14 % (ref 11–15)
WBC: 12.67 10*3/uL — ABNORMAL HIGH (ref 3.10–9.50)
nRBC %: 0 /100 WBC (ref <=0.0)

## 2022-09-02 LAB — COMPREHENSIVE METABOLIC PANEL
ALT: 22 U/L (ref 0–55)
AST (SGOT): 24 U/L (ref 5–41)
Albumin/Globulin Ratio: 1 (ref 0.9–2.2)
Albumin: 3.6 g/dL (ref 3.5–5.0)
Alkaline Phosphatase: 135 U/L — ABNORMAL HIGH (ref 37–117)
Anion Gap: 11 (ref 5.0–15.0)
BUN: 17 mg/dL (ref 7–21)
Bilirubin, Total: 1 mg/dL (ref 0.2–1.2)
CO2: 23 mEq/L (ref 17–29)
Calcium: 8.9 mg/dL (ref 8.5–10.5)
Chloride: 108 mEq/L (ref 99–111)
Creatinine: 0.9 mg/dL (ref 0.4–1.0)
GFR: >60 mL/min/{1.73_m2} (ref >=60.0)
Globulin: 3.6 g/dL (ref 2.0–3.6)
Glucose: 207 mg/dL — ABNORMAL HIGH (ref 70–100)
Potassium: 4.1 mEq/L (ref 3.5–5.3)
Protein, Total: 7.2 g/dL (ref 6.0–8.3)
Sodium: 142 mEq/L (ref 135–145)

## 2022-09-02 LAB — COVID-19 (SARS-COV-2) & INFLUENZA  A/B, NAA (ROCHE LIAT)
Influenza A RNA: NOT DETECTED
Influenza B RNA: NOT DETECTED
SARS-CoV-2 (COVID-19) RNA: NOT DETECTED

## 2022-09-02 LAB — NT-PROBNP: NT-ProBNP: 3018 pg/mL — ABNORMAL HIGH (ref <125)

## 2022-09-02 LAB — HIGH SENSITIVITY TROPONIN-I: hs Troponin: 15 ng/L — ABNORMAL HIGH (ref <=14.0)

## 2022-09-02 LAB — HIGH SENSITIVITY TROPONIN-I WITH DELTA
hs Troponin-I Delta: 1
hs Troponin: 13.7 ng/L (ref <=14.0)

## 2022-09-02 MED ORDER — INSULIN LISPRO 100 UNIT/ML SOLN (WRAP)
1.0000 [IU] | Freq: Three times a day (TID) | Status: DC
Start: 2022-09-02 — End: 2022-09-05
  Administered 2022-09-03: 5 [IU] via SUBCUTANEOUS
  Administered 2022-09-03: 3 [IU] via SUBCUTANEOUS
  Administered 2022-09-04: 5 [IU] via SUBCUTANEOUS
  Filled 2022-09-02: qty 15
  Filled 2022-09-02: qty 9
  Filled 2022-09-02: qty 15

## 2022-09-02 MED ORDER — POTASSIUM & SODIUM PHOSPHATES 280-160-250 MG PO PACK
2.0000 | PACK | ORAL | Status: DC | PRN
Start: 2022-09-02 — End: 2022-09-05

## 2022-09-02 MED ORDER — CARBOXYMETHYLCELLULOSE SODIUM 0.5 % OP SOLN
1.0000 [drp] | Freq: Three times a day (TID) | OPHTHALMIC | Status: DC | PRN
Start: 2022-09-02 — End: 2022-09-05
  Administered 2022-09-04 – 2022-09-05 (×2): 1 [drp] via OPHTHALMIC
  Filled 2022-09-02: qty 1

## 2022-09-02 MED ORDER — NIFEDIPINE ER OSMOTIC RELEASE 30 MG PO TB24
30.0000 mg | ORAL_TABLET | Freq: Every day | ORAL | Status: DC
Start: 2022-09-02 — End: 2022-09-02

## 2022-09-02 MED ORDER — HYDRALAZINE HCL 20 MG/ML IJ SOLN
10.0000 mg | Freq: Four times a day (QID) | INTRAMUSCULAR | Status: DC | PRN
Start: 2022-09-02 — End: 2022-09-05

## 2022-09-02 MED ORDER — MAGNESIUM SULFATE IN D5W 1-5 GM/100ML-% IV SOLN
1.0000 g | INTRAVENOUS | Status: DC | PRN
Start: 2022-09-02 — End: 2022-09-05

## 2022-09-02 MED ORDER — DEXTROSE 10 % IV BOLUS
12.5000 g | INTRAVENOUS | Status: DC | PRN
Start: 2022-09-02 — End: 2022-09-02

## 2022-09-02 MED ORDER — SALINE SPRAY 0.65 % NA SOLN
2.0000 | NASAL | Status: DC | PRN
Start: 2022-09-02 — End: 2022-09-05

## 2022-09-02 MED ORDER — GLUCAGON 1 MG IJ SOLR (WRAP)
1.0000 mg | INTRAMUSCULAR | Status: DC | PRN
Start: 2022-09-02 — End: 2022-09-05

## 2022-09-02 MED ORDER — POTASSIUM CHLORIDE 10 MEQ/100ML IV SOLN
10.0000 meq | INTRAVENOUS | Status: DC | PRN
Start: 2022-09-02 — End: 2022-09-05
  Administered 2022-09-03 (×2): 10 meq via INTRAVENOUS
  Filled 2022-09-02 (×2): qty 100

## 2022-09-02 MED ORDER — POTASSIUM CHLORIDE CRYS ER 20 MEQ PO TBCR
0.0000 meq | EXTENDED_RELEASE_TABLET | ORAL | Status: DC | PRN
Start: 2022-09-02 — End: 2022-09-05
  Administered 2022-09-03: 40 meq via ORAL
  Filled 2022-09-02: qty 2

## 2022-09-02 MED ORDER — DEXTROSE 10 % IV BOLUS
12.5000 g | INTRAVENOUS | Status: DC | PRN
Start: 2022-09-02 — End: 2022-09-05
  Filled 2022-09-02: qty 250

## 2022-09-02 MED ORDER — BENZOCAINE-MENTHOL MT LOZG (WRAP)
1.0000 | LOZENGE | OROMUCOSAL | Status: DC | PRN
Start: 2022-09-02 — End: 2022-09-05

## 2022-09-02 MED ORDER — INSULIN GLARGINE 100 UNIT/ML SC SOLN
20.0000 [IU] | Freq: Every morning | SUBCUTANEOUS | Status: DC
Start: 2022-09-03 — End: 2022-09-03

## 2022-09-02 MED ORDER — ATORVASTATIN CALCIUM 40 MG PO TABS
40.0000 mg | ORAL_TABLET | Freq: Every day | ORAL | Status: DC
Start: 2022-09-03 — End: 2022-09-05
  Administered 2022-09-03 – 2022-09-05 (×3): 40 mg via ORAL
  Filled 2022-09-02 (×3): qty 1

## 2022-09-02 MED ORDER — BUMETANIDE 0.25 MG/ML IJ SOLN
2.0000 mg | Freq: Two times a day (BID) | INTRAMUSCULAR | Status: DC
Start: 2022-09-02 — End: 2022-09-03
  Administered 2022-09-02 – 2022-09-03 (×2): 2 mg via INTRAVENOUS
  Filled 2022-09-02 (×2): qty 8

## 2022-09-02 MED ORDER — DEXTROSE 50 % IV SOLN
12.5000 g | INTRAVENOUS | Status: DC | PRN
Start: 2022-09-02 — End: 2022-09-02

## 2022-09-02 MED ORDER — TIMOLOL MALEATE 0.5 % OP SOLN
1.0000 [drp] | Freq: Every day | OPHTHALMIC | Status: DC
Start: 2022-09-03 — End: 2022-09-05
  Administered 2022-09-03 – 2022-09-05 (×3): 1 [drp] via OPHTHALMIC
  Filled 2022-09-02: qty 5

## 2022-09-02 MED ORDER — ACETAMINOPHEN 325 MG PO TABS
650.0000 mg | ORAL_TABLET | Freq: Three times a day (TID) | ORAL | Status: DC | PRN
Start: 2022-09-02 — End: 2022-09-05

## 2022-09-02 MED ORDER — BUMETANIDE 0.25 MG/ML IJ SOLN
1.0000 mg | Freq: Once | INTRAMUSCULAR | Status: AC
Start: 2022-09-02 — End: 2022-09-02
  Administered 2022-09-02: 1 mg via INTRAVENOUS
  Filled 2022-09-02: qty 4

## 2022-09-02 MED ORDER — GLUCAGON 1 MG IJ SOLR (WRAP)
1.0000 mg | INTRAMUSCULAR | Status: DC | PRN
Start: 2022-09-02 — End: 2022-09-02

## 2022-09-02 MED ORDER — LISINOPRIL 20 MG PO TABS
40.0000 mg | ORAL_TABLET | Freq: Every day | ORAL | Status: DC
Start: 2022-09-03 — End: 2022-09-05
  Administered 2022-09-03 – 2022-09-05 (×3): 40 mg via ORAL
  Filled 2022-09-02 (×3): qty 2

## 2022-09-02 MED ORDER — ENOXAPARIN SODIUM 40 MG/0.4ML IJ SOSY
40.0000 mg | PREFILLED_SYRINGE | Freq: Every day | INTRAMUSCULAR | Status: DC
Start: 2022-09-03 — End: 2022-09-04
  Administered 2022-09-03 – 2022-09-04 (×2): 40 mg via SUBCUTANEOUS
  Filled 2022-09-02 (×2): qty 0.4

## 2022-09-02 MED ORDER — GLUCOSE 40 % PO GEL (WRAP)
15.0000 g | ORAL | Status: DC | PRN
Start: 2022-09-02 — End: 2022-09-02

## 2022-09-02 MED ORDER — DEXTROSE 50 % IV SOLN
12.5000 g | INTRAVENOUS | Status: DC | PRN
Start: 2022-09-02 — End: 2022-09-05

## 2022-09-02 MED ORDER — BENZONATATE 100 MG PO CAPS
100.0000 mg | ORAL_CAPSULE | Freq: Three times a day (TID) | ORAL | Status: DC | PRN
Start: 2022-09-02 — End: 2022-09-05

## 2022-09-02 MED ORDER — GLUCOSE 40 % PO GEL (WRAP)
15.0000 g | ORAL | Status: DC | PRN
Start: 2022-09-02 — End: 2022-09-05
  Administered 2022-09-03: 15 g via ORAL
  Filled 2022-09-02: qty 12.8

## 2022-09-02 MED ORDER — NALOXONE HCL 0.4 MG/ML IJ SOLN (WRAP)
0.2000 mg | INTRAMUSCULAR | Status: DC | PRN
Start: 2022-09-02 — End: 2022-09-05

## 2022-09-02 MED ORDER — MELATONIN 3 MG PO TABS
3.0000 mg | ORAL_TABLET | Freq: Every evening | ORAL | Status: DC | PRN
Start: 2022-09-02 — End: 2022-09-05

## 2022-09-02 MED ORDER — ENOXAPARIN SODIUM 40 MG/0.4ML IJ SOSY
40.0000 mg | PREFILLED_SYRINGE | Freq: Every day | INTRAMUSCULAR | Status: DC
Start: 2022-09-02 — End: 2022-09-02

## 2022-09-02 NOTE — H&P (Signed)
[facility]      Patient: [name]  Date: 09/02/2022   DOB: 1955/12/29  Date of Admission: 09/02/2022   MRN: [id number]  Attending: Dorothyann Gibbs, MD         Chief Complaint   Patient presents with    Chest Pain    Shortness of Breath        History Gathered From: Patient, chart    HISTORY AND PHYSICAL     [name] is a 67 y.o. female with a PMHx of essential hypertension, dyslipidemia, type 2 diabetes mellitus, coronary artery disease, paroxysmal A-fib, class I obesity, glaucoma and history of CHF with diastolic dysfunction  who presented with progressive shortness of breath.    Patient endorses progressive shortness of breath, exertional dyspnea, orthopnea, and paroxysmal nocturnal dyspnea, along with general fluid overload over the past several days.  She has noted some extremity swelling.  She has not been urinating well.  She denies chest pains, palpitations, N/V/D, fevers or chills.    In the ED, satting 96 to 97% on 5 L/min via nasal cannula with increased respiratory rate to 32, slightly hypertensive.  Labs significant for mild leukocytosis 12.67, high-sensitivity troponin 15, NT proBNP 3018, otherwise within normal limits.  CXR shows worsening pulmonary edema suspicious for CHF.    Medical History[1]    Past Surgical History:   Procedure Laterality Date    BREAST CYST EXCISION  1993    bilateral    TUBAL LIGATION         Prior to Admission medications    Medication Sig Start Date End Date Taking? Authorizing Provider   atorvastatin (LIPITOR) 40 MG tablet Take 1 tablet (40 mg) by mouth every evening 08/23/22   Gami, Berline Chough, MD   Blood Glucose Monitoring Suppl (ACCU-CHEK AVIVA PLUS) w/Device Kit Check blood glucose level twice daily. ICD 10 Code E11.9, Z79.4 06/11/18   Castillo-Catoni, Maudry Mayhew, MD   bumetanide Janalyn Harder) 1 MG tablet Take by mouth Once every Monday, Wednesday and Friday morning 2 mg MWF    [provider]   bumetanide (BUMEX) 1 MG tablet Take 2 mg MWF and 1 mg  every other days 08/23/22   Melvenia Beam, MD   Calcium Carbonate-Vitamin D (CALCIUM-D PO) Take 1 tablet by mouth daily.    [provider]   carvedilol (COREG) 25 MG tablet Take 1 tablet (25 mg) by mouth daily 08/23/22   Melvenia Beam, MD   estradiol (ESTRACE) 0.1 MG/GM vaginal cream Place 0.4 g vaginally twice a week 10/19/21 06/13/22  Gevena Mart, NP   glucose blood (Accu-Chek Aviva) test strip Check blood glucose level twice daily. ICD 10 Code E11.9, Z79.4 11/19/21   Janie Morning, MD   insulin NPH (NovoLIN N) 100 UNIT/ML injection INJECT 25 UNITS SUBCUTANEOUSLY WITH BREAKFST AND 40  UNITS WITH DINNERI 08/23/22   Melvenia Beam, MD   Insulin Syringe-Needle U-100 (BD INSULIN SYRINGE ULTRAFINE) 31G X 5/16" 1 ML Misc ADMINSTER INSULIN UNDER THE SKIN 2 TIMES A DAY 11/19/21   Janie Morning, MD   lisinopril (ZESTRIL) 40 MG tablet Take 1 tablet (40 mg) by mouth daily 08/23/22   Gami, Berline Chough, MD   Multiple Vitamins-Minerals (multivitamin with minerals) tablet Take 1 tablet by mouth daily    [provider]   NIFEdipine XL (PROCARDIA XL) 30 MG 24 hr tablet Take 1 tablet (30 mg) by mouth daily 08/23/22   Gami, Berline Chough, MD  timolol (TIMOPTIC) 0.5 % ophthalmic solution Place 1 drop into both eyes daily    [provider]       Allergies[2]    Family History   Problem Relation Age of Onset    Heart disease Mother     Diabetes Mother     Hypertension Mother     Hyperlipidemia Mother     Heart attack Mother     Heart disease Father     Diabetes Father     Hypertension Father     Hyperlipidemia Father     Cirrhosis Father     Diabetes Sister     Liver cancer Maternal Aunt     Cancer Maternal Uncle         liver       Social History[3]    REVIEW OF SYSTEMS   Positive for: Shortness of breath  Negative for: Chest pain, N/V/D, subjective fever  All ROS completed and otherwise negative.    PHYSICAL EXAM     Vital Signs (most recent): BP 143/70   Pulse 76   Temp 98 F (36.7 C)   Resp 22   Ht  1.651 m (5\' 5" )   Wt 81.6 kg (180 lb)   SpO2 96%   BMI 29.95 kg/m   Constitutional: Distressed. Patient speaks freely in full sentences.   HEENT: NC/AT, PERRL, no scleral icterus or conjunctival pallor, no nasal discharge, MMM, oropharynx without erythema or exudate  Neck: trachea midline, supple, no cervical or supraclavicular lymphadenopathy or masses  Cardiovascular: RRR, normal S1 S2, no murmurs, gallops, palpable thrills, no JVD, Non-displaced PMI.  Respiratory: Normal rate. No retractions or increased work of breathing. Clear to auscultation and percussion bilaterally.  Gastrointestinal: +BS, non-distended, soft, non-tender, no rebound or guarding, no hepatosplenomegaly  Genitourinary: no suprapubic or costovertebral angle tenderness  Musculoskeletal: ROM and motor strength grossly normal. No clubbing, edema, or cyanosis. DP and radial pulses 2+ and symmetric.  Neurologic: EOMI, CN 2-12 grossly intact. no gross motor or sensory deficits  Psychiatric: AAOx3, affect and mood appropriate. The patient is alert, interactive, appropriate.    LABS & IMAGING     Recent Results (from the past 24 hour(s))   ECG 12 lead    Collection Time: 09/02/22  1:20 PM   Result Value Ref Range    Ventricular Rate 78 BPM    Atrial Rate  BPM    P-R Interval  ms    QRS Duration 74 ms    Q-T Interval 370 ms    QTC Calculation (Bezet) 421 ms    P Axis  degrees    R Axis 105 degrees    T Axis 41 degrees    IHS MUSE NARRATIVE AND IMPRESSION       ATRIAL FIBRILLATION  RIGHT AXIS DEVIATION  ABNORMAL ECG  WHEN COMPARED WITH ECG OF 21-Apr-2021 06:14,  VENTRICULAR RATE HAS INCREASED by  26 bpm  QRS AXIS SHIFTED RIGHT  T WAVE AMPLITUDE HAS DECREASED IN Anterolateral leads     High Sensitivity Troponin-I    Collection Time: 09/02/22  1:22 PM   Result Value Ref Range    hs Troponin 15.0 (H) <=14.0 ng/L   CBC with Differential (Component)    Collection Time: 09/02/22  1:22 PM   Result Value Ref Range    WBC 12.67 (H) 3.10 - 9.50 x10 3/uL     Hemoglobin 14.3 11.4 - 14.8 g/dL    Hematocrit 16.1 (H) 34.7 - 43.7 %  Platelet Count 203 142 - 346 x10 3/uL    MPV 11.2 8.9 - 12.5 fL    RBC 4.83 3.90 - 5.10 x10 6/uL    MCV 90.7 78.0 - 96.0 fL    MCH 29.6 25.1 - 33.5 pg    MCHC 32.6 31.5 - 35.8 g/dL    RDW 14 11 - 15 %    nRBC % 0.0 <=0.0 /100 WBC    Absolute nRBC 0.00 <=0.00 x10 3/uL    Preliminary Absolute Neutrophil Count 10.10 (H) 1.10 - 6.33 x10 3/uL    Neutrophils % 79.7 Not Established %    Lymphocytes % 9.3 Not Established %    Monocytes % 8.1 Not Established %    Eosinophils % 2.1 Not Established %    Basophils % 0.4 Not Established %    Immature Granulocytes % 0.4 Not Established %    Absolute Neutrophils 10.10 (H) 1.10 - 6.33 x10 3/uL    Absolute Lymphocytes 1.18 0.42 - 3.22 x10 3/uL    Absolute Monocytes 1.02 (H) 0.21 - 0.85 x10 3/uL    Absolute Eosinophils 0.27 0.00 - 0.44 x10 3/uL    Absolute Basophils 0.05 0.00 - 0.08 x10 3/uL    Absolute Immature Granulocytes 0.05 0.00 - 0.07 x10 3/uL   NT-ProBNP    Collection Time: 09/02/22  1:22 PM   Result Value Ref Range    NT-ProBNP 3,018 (H) <125 pg/mL   COVID-19 and Influenza ([place]) (symptomatic)    Collection Time: 09/02/22  1:28 PM    Specimen: Anterior Nares; Swab   Result Value Ref Range    SARS-CoV-2 (COVID-19) RNA Not Detected Not Detected    Influenza A RNA Not Detected Not Detected    Influenza B RNA Not Detected Not Detected   Comprehensive Metabolic Panel    Collection Time: 09/02/22  1:40 PM   Result Value Ref Range    Glucose 207 (H) 70 - 100 mg/dL    BUN 17 7 - 21 mg/dL    Creatinine 0.9 0.4 - 1.0 mg/dL    Sodium 161 096 - 045 mEq/L    Potassium 4.1 3.5 - 5.3 mEq/L    Chloride 108 99 - 111 mEq/L    CO2 23 17 - 29 mEq/L    Calcium 8.9 8.5 - 10.5 mg/dL    Anion Gap 40.9 5.0 - 15.0    GFR >60.0 >=60.0 mL/min/1.73 m2    AST (SGOT) 24 5 - 41 U/L    ALT 22 0 - 55 U/L    Alkaline Phosphatase 135 (H) 37 - 117 U/L    Albumin 3.6 3.5 - 5.0 g/dL    Protein, Total 7.2 6.0 - 8.3 g/dL    Globulin 3.6 2.0 -  3.6 g/dL    Albumin/Globulin Ratio 1.0 0.9 - 2.2    Bilirubin, Total 1.0 0.2 - 1.2 mg/dL       MICROBIOLOGY:  Blood Culture: None  Urine Culture: None    CARDIAC:  EKG Interpretation (upon my review): A-fib    Markers:  Recent Labs   Lab 09/02/22  1322   hs Troponin 15.0*       EMERGENCY DEPARTMENT COURSE:  Orders Placed This Encounter   Procedures    COVID-19 and Influenza ([facility]) (symptomatic)    Chest AP Portable    CBC with Differential (Order)    High Sensitivity Troponin-I    CBC with Differential (Component)    NT-ProBNP    Comprehensive Metabolic Panel  High Sensitivity Troponin-I at 2 hrs with calculated Delta    Comprehensive Metabolic Panel    CBC with Differential (Order)    Adult diet Therapeutic/ Modified; Solid; Regular (IDDSI level 7); Thin (IDDSI level 0)    Vital Signs    Vital signs    Pulse Oximetry    Progressive Mobility Protocol    Notify physician    I/O    Height    Weight    Skin assessment    Notify physician (Critical Blood Glucose Value)    Notify physician (Communication: Document Abnormal Blood Glucose)    POCT order (PRN hypoglycemia)    Adult Hypoglycemia Treatment Algorithm    Place sequential compression device    Maintain sequential compression device    Education: Activity    Education: Disease Process & Condition    Education: Pain Management    Education: Falls Risk    Education: Smoking Cessation    Fluid restriction    Target Glucose Goals    Nursing communication    NSG Communication: Glucose POCT order (PRN hypoglycemia)    Notify physician (Critical Blood Glucose Value)    Notify physician (Communication: Document Abnormal Blood Glucose)    POCT order (PRN hypoglycemia)    Adult Hypoglycemia Treatment Algorithm    NSG Communication: Glucose POCT order    Full Code    ECG 12 lead    Saline lock IV    Saline lock IV    Admit to Observation    Supervise For Meals Frequency: All meals       ASSESSMENT & PLAN     [name] is a 67 y.o. female admitted with Fluid  overload.    Acute on chronic diastolic heart failure  -Bumex 2 mg twice daily  -Monitor intake and output  -Monitor electrolytes and renal function  -Perform daily weights  -Fluid restriction 1500 mL per 24-hour.  -Consider repeat TTE this admission  -Consider AM consult to cardiology  -Hold Coreg     Hypertension  -Continue lisinopril and nifedipine  -Hold Coreg  -IV hydralazine as needed for systolics greater than 160    Type 2 diabetes  -Lantus 20 units every morning (may need to increase)  -Medium dose sliding scale insulin     Dyslipidemia  -Continue atorvastatin     Class I obesity:  -Lifestyle modification and weight loss.     Paroxysmal A-fib  -Hold Coreg    Glaucoma  -Timolol drops      Patient has BMI=Body mass index is 29.95 kg/m.  Diagnosis: Obesity based on BMI criteria        Nutrition: Heart healthy diet    DVT/VTE Prophylaxis:   Current Facility-Administered Medications (Includes Only Anticoagulants, Misc. Hematological)   Medication Dose Route Last Admin    [START ON 09/03/2022] enoxaparin (LOVENOX) syringe 40 mg  40 mg Subcutaneous         Code Status: Full Code    Patient Class: OBSERVATION.    Anticipated medical stability for discharge: 24 - 48 Hours      Signed,  Dorothyann Gibbs, MD    09/02/2022 3:06 PM  Time Elapsed: 45 minutes             [1]   Past Medical History:  Diagnosis Date    Congenital anomaly of heart     Diabetes mellitus type 2 in obese     Diabetic retinopathy 06/28/2013    Of the right eye, per patient  Glaucoma 06/01/2012    Hyperlipidemia     Hypertension     Obesity     Venous insufficiency 09/17/2014    Followed at [facility].  Wears compression stockings.    [2]   Allergies  Allergen Reactions    Shellfish-Derived Products    [3]   Social History  Tobacco Use    Smoking status: Never    Smokeless tobacco: Never   Vaping Use    Vaping status: Never Used   Substance Use Topics    Alcohol use: No    Drug use: No

## 2022-09-02 NOTE — Plan of Care (Signed)
Problem: Pain interferes with ability to perform ADL  Goal: Pain at adequate level as identified by patient  Outcome: Progressing  Flowsheets (Taken 09/02/2022 1752)  Pain at adequate level as identified by patient:   Identify patient comfort function goal   Assess for risk of opioid induced respiratory depression, including snoring/sleep apnea. Alert healthcare team of risk factors identified.   Assess pain on admission, during daily assessment and/or before any "as needed" intervention(s)   Reassess pain within 30-60 minutes of any procedure/intervention, per Pain Assessment, Intervention, Reassessment (AIR) Cycle   Evaluate if patient comfort function goal is met   Evaluate patient's satisfaction with pain management progress   Offer non-pharmacological pain management interventions   Consult/collaborate with Pain Service   Consult/collaborate with Physical Therapy, Occupational Therapy, and/or Speech Therapy   Include patient/patient care companion in decisions related to pain management as needed

## 2022-09-02 NOTE — ED to IP RN Note (Signed)
[facility] EMERGENCY DEPARTMENT   ED Nursing Note For The Receiving Inpatient Nurse - UPDATE     The following information is an update to the previous ED Nursing Admission Note:    2nd troponin due @ 1555      VITAL SIGNS     Vitals:    09/02/22 1415   BP: 143/70   Pulse: 76   Resp: 22   Temp:    SpO2: 96%     Pain Score: 8-severe pain (09/02/22 1312)

## 2022-09-02 NOTE — ED to IP RN Note (Signed)
[facility] EMERGENCY DEPARTMENT  ED NURSING NOTE FOR THE RECEIVING INPATIENT NURSE   ED NURSE sam   [place] [phone]   ED CHARGE RN Raynelle Fanning   ADMISSION INFORMATION   [name] is a 67 y.o. female admitted with an ED diagnosis of:    1. Acute on chronic congestive heart failure, unspecified heart failure type    2. Fluid overload    3. Acute pulmonary edema         Isolation: None   Allergies: Shellfish-derived products   Holding Orders confirmed? No   Belongings Documented? No   Home medications sent to pharmacy confirmed? No   NURSING CARE   Patient Comes From:   Mental Status: Home/Family Care  alert and oriented   ADL: Needs assistance with ADLs   Ambulation: mild difficulty   Pertinent Information  and Safety Concerns:     Broset Violence Risk Level: Low family stays with patient> spanish speaking son translates, pt is on 4L nasal cannula.      CT / NIH   CT Head ordered on this patient?  No   NIH/Dysphagia assessment done prior to admission? No   VITAL SIGNS (at the time of this note)      Vitals:    09/02/22 1415   BP: 143/70   Pulse: 76   Resp: 22   Temp:    SpO2: 96%     Pain Score: 8-severe pain (09/02/22 1312)

## 2022-09-02 NOTE — ED Provider Notes (Signed)
History     Chief Complaint   Patient presents with   . Chest Pain   . Shortness of Breath     HPI 67 year old past medical history of A-fib, CHF presents today for evaluation of shortness of breath.  Reports progressive dyspnea on exertion and shortness of breath at rest for the past week.  Some chest tightness and leg swelling.  Had to come home from work today because she was too tired to continue working and noted that her oxygen was low on her home pulse ox.  On arrival here she was 85% on room air.  Denies any other complaints at this time.    Medical History[1]    Past Surgical History:   Procedure Laterality Date   . BREAST CYST EXCISION  1993    bilateral   . TUBAL LIGATION         Family History   Problem Relation Age of Onset   . Heart disease Mother    . Diabetes Mother    . Hypertension Mother    . Hyperlipidemia Mother    . Heart attack Mother    . Heart disease Father    . Diabetes Father    . Hypertension Father    . Hyperlipidemia Father    . Cirrhosis Father    . Diabetes Sister    . Liver cancer Maternal Aunt    . Cancer Maternal Uncle         liver       Social  Social History[2]    .     Allergies[3]    Home Medications               atorvastatin (LIPITOR) 40 MG tablet     Take 1 tablet (40 mg) by mouth every evening     Blood Glucose Monitoring Suppl (ACCU-CHEK AVIVA PLUS) w/Device Kit     Check blood glucose level twice daily. ICD 10 Code E11.9, Z79.4     bumetanide (BUMEX) 1 MG tablet     Take by mouth Once every Monday, Wednesday and Friday morning 2 mg MWF     bumetanide (BUMEX) 1 MG tablet     Take 2 mg MWF and 1 mg every other days     Calcium Carbonate-Vitamin D (CALCIUM-D PO)     Take 1 tablet by mouth daily.     carvedilol (COREG) 25 MG tablet     Take 1 tablet (25 mg) by mouth daily     estradiol (ESTRACE) 0.1 MG/GM vaginal cream (Expired)     Place 0.4 g vaginally twice a week     glucose blood (Accu-Chek Aviva) test strip     Check blood glucose level twice daily. ICD 10 Code  E11.9, Z79.4     insulin NPH (NovoLIN N) 100 UNIT/ML injection     INJECT 25 UNITS SUBCUTANEOUSLY WITH BREAKFST AND 40  UNITS WITH DINNERI     Insulin Syringe-Needle U-100 (BD INSULIN SYRINGE ULTRAFINE) 31G X 5/16" 1 ML Misc     ADMINSTER INSULIN UNDER THE SKIN 2 TIMES A DAY     lisinopril (ZESTRIL) 40 MG tablet     Take 1 tablet (40 mg) by mouth daily     Multiple Vitamins-Minerals (multivitamin with minerals) tablet     Take 1 tablet by mouth daily     NIFEdipine XL (PROCARDIA XL) 30 MG 24 hr tablet     Take 1 tablet (30 mg) by mouth daily  timolol (TIMOPTIC) 0.5 % ophthalmic solution     Place 1 drop into both eyes daily             Review of Systems   All other systems reviewed and are negative.      Physical Exam    BP: 148/85, Heart Rate: 84, Temp: 98 F (36.7 C), SpO2: (!) 86 %, Weight: 81.6 kg    Physical Exam  Vitals reviewed.   Constitutional:       General: She is in acute distress.      Appearance: She is well-developed. She is obese. She is not ill-appearing or toxic-appearing.   HENT:      Head: Normocephalic and atraumatic.   Eyes:      Extraocular Movements: Extraocular movements intact.      Pupils: Pupils are equal, round, and reactive to light.   Neck:      Vascular: JVD present.   Cardiovascular:      Comments: Irregularly irregular with a rate in the 70s to 80s.  2+ lower extremity edema.  Hepatojugular reflex present  Pulmonary:      Comments: Saturating 95 to 96% on 5 L nasal cannula.  Speaking in full sentences mildly increased work of breathing that improved after being placed on nasal cannula with a respiratory rate of initially of around 22 that improved to 16-18 after being placed on nasal cannula.  Occasional dry cough.  Crackles at the bases.  Abdominal:      Palpations: Abdomen is soft.      Tenderness: There is no abdominal tenderness. There is no guarding or rebound.   Musculoskeletal:         General: Normal range of motion.      Right lower leg: Edema present.      Left lower  leg: Edema present.   Skin:     General: Skin is warm and dry.      Capillary Refill: Capillary refill takes less than 2 seconds.   Neurological:      General: No focal deficit present.      Mental Status: She is alert and oriented to person, place, and time.      Motor: No weakness.   Psychiatric:         Mood and Affect: Mood normal.         Behavior: Behavior normal.           MDM and ED Course     ED Medication Orders (From admission, onward)      Start Ordered     Status Ordering Provider    09/02/22 1327 09/02/22 1327  bumetanide (BUMEX) injection 1 mg  Once        Route: Intravenous  Ordered Dose: 1 mg       Ordered Ennis Delpozo C               Medical Decision Making  67 year old dyspnea on exertion and shortness of breath at rest.  On examination grossly fluid overloaded with lower extremity edema JVD and hepatojugular reflux.  In A-fib but without evidence of A-fib with RVR.  Crackles at the bases.  Seems consistent with fluid overload and CHF exacerbation.  Checking basic labs as well as cardiac biomarkers for screening to evaluate for atypical ACS as well as will check COVID and flu and chest x-ray.  Giving dose of bumetanide and anticipate likely admission for continued diuresis    Amount and/or Complexity of Data Reviewed  Labs:  ordered.  Radiology: ordered.  ECG/medicine tests: ordered.    Risk  Prescription drug management.          ED Course as of 09/02/22 1416   Fri Sep 02, 2022   1324 ED physician interpretation of EKG shows A-fib with normal rate.  Otherwise normal intervals.  No STEMI [AS]   1340 My interpretation of chest x-ray shows cardiomegaly with pulmonary edema [AS]   1416 Discussion with hospitalist about admission for CHF exacerbation. [AS]      ED Course User Index  [AS] Greggory Keen, MD             Procedures    Clinical Impression & Disposition     Clinical Impression  Final diagnoses:   None        ED Disposition       None             New Prescriptions    No  medications on file                     [1]   Past Medical History:  Diagnosis Date   . Congenital anomaly of heart    . Diabetes mellitus type 2 in obese    . Diabetic retinopathy 06/28/2013    Of the right eye, per patient   . Glaucoma 06/01/2012   . Hyperlipidemia    . Hypertension    . Obesity    . Venous insufficiency 09/17/2014    Followed at [facility].  Wears compression stockings.    [2]   Social History  Tobacco Use   . Smoking status: Never   . Smokeless tobacco: Never   Vaping Use   . Vaping status: Never Used   Substance Use Topics   . Alcohol use: No   . Drug use: No   [3]   Allergies  Allergen Reactions   . Shellfish-Derived Products     Greggory Keen, MD  Resident  09/02/22 [phone]

## 2022-09-02 NOTE — ED Triage Notes (Signed)
[name] is a 67 y.o. female  Presenting to the ED with SOB after one week of not feeling well and being tired. Pt came home from work today with worsening SOB and was 86% RA upon arrival to ER. No recent travel. Hx of afib and fluid in lungs.

## 2022-09-03 ENCOUNTER — Observation Stay (HOSPITAL_BASED_OUTPATIENT_CLINIC_OR_DEPARTMENT_OTHER): Payer: Medicare PPO

## 2022-09-03 DIAGNOSIS — I509 Heart failure, unspecified: Secondary | ICD-10-CM

## 2022-09-03 LAB — COMPREHENSIVE METABOLIC PANEL
ALT: 16 U/L (ref 0–55)
AST (SGOT): 15 U/L (ref 5–41)
Albumin/Globulin Ratio: 0.9 (ref 0.9–2.2)
Albumin: 3.1 g/dL — ABNORMAL LOW (ref 3.5–5.0)
Alkaline Phosphatase: 100 U/L (ref 37–117)
Anion Gap: 9 (ref 5.0–15.0)
BUN: 18 mg/dL (ref 7–21)
Bilirubin, Total: 1.1 mg/dL (ref 0.2–1.2)
CO2: 26 mEq/L (ref 17–29)
Calcium: 8.2 mg/dL — ABNORMAL LOW (ref 8.5–10.5)
Chloride: 107 mEq/L (ref 99–111)
Creatinine: 0.9 mg/dL (ref 0.4–1.0)
GFR: >60 mL/min/{1.73_m2} (ref >=60.0)
Globulin: 3.5 g/dL (ref 2.0–3.6)
Glucose: 59 mg/dL — ABNORMAL LOW (ref 70–100)
Potassium: 3.2 mEq/L — ABNORMAL LOW (ref 3.5–5.3)
Protein, Total: 6.6 g/dL (ref 6.0–8.3)
Sodium: 142 mEq/L (ref 135–145)

## 2022-09-03 LAB — WHOLE BLOOD GLUCOSE POCT
Whole Blood Glucose POCT: 57 mg/dL — ABNORMAL LOW (ref 70–100)
Whole Blood Glucose POCT: 73 mg/dL (ref 70–100)
Whole Blood Glucose POCT: 97 mg/dL (ref 70–100)
Whole Blood Glucose POCT: 168 mg/dL — ABNORMAL HIGH (ref 70–100)
Whole Blood Glucose POCT: 283 mg/dL — ABNORMAL HIGH (ref 70–100)
Whole Blood Glucose POCT: 206 mg/dL — ABNORMAL HIGH (ref 70–100)
Whole Blood Glucose POCT: 238 mg/dL — ABNORMAL HIGH (ref 70–100)

## 2022-09-03 LAB — ECHO ADULT TTE COMPLETE
AV Area (Cont Eq VTI): 3.428
AV Area (Cont Eq VTI): 3.62
AV Mean Gradient: 3
AV Mean Gradient: 4
Ao Root Diameter (2D): 3.4
BP Mod LV Ejection Fraction: 66
IVS Diastolic Thickness (2D): 0.968
LA Dimension (2D): 3.6
LA Volume Index (BP A-L): 24
LVID diastole (2D): 3.69
LVID systole (2D): 2.46
MV Area (PHT): 3.993
MV E/e' (Average): 12.79
Prox Ascending Aorta Diameter: 3.2
RV Basal Diastolic Dimension: 3.65
RV Systolic Pressure: 44.482
TAPSE: 1.6

## 2022-09-03 LAB — LAB USE ONLY - CBC WITH DIFFERENTIAL
Absolute Basophils: 0.03 10*3/uL (ref 0.00–0.08)
Absolute Eosinophils: 0.17 10*3/uL (ref 0.00–0.44)
Absolute Immature Granulocytes: 0.03 10*3/uL (ref 0.00–0.07)
Absolute Lymphocytes: 1.55 10*3/uL (ref 0.42–3.22)
Absolute Monocytes: 1.06 10*3/uL — ABNORMAL HIGH (ref 0.21–0.85)
Absolute Neutrophils: 7.51 10*3/uL — ABNORMAL HIGH (ref 1.10–6.33)
Absolute nRBC: 0 10*3/uL (ref <=0.00)
Basophils %: 0.3 %
Eosinophils %: 1.6 %
Hematocrit: 38.3 % (ref 34.7–43.7)
Hemoglobin: 13.2 g/dL (ref 11.4–14.8)
Immature Granulocytes %: 0.3 %
Lymphocytes %: 15 %
MCH: 30.9 pg (ref 25.1–33.5)
MCHC: 34.5 g/dL (ref 31.5–35.8)
MCV: 89.7 fL (ref 78.0–96.0)
MPV: 11.2 fL (ref 8.9–12.5)
Monocytes %: 10.2 %
Neutrophils %: 72.6 %
Platelet Count: 175 10*3/uL (ref 142–346)
Preliminary Absolute Neutrophil Count: 7.51 10*3/uL — ABNORMAL HIGH (ref 1.10–6.33)
RBC: 4.27 10*6/uL (ref 3.90–5.10)
RDW: 14 % (ref 11–15)
WBC: 10.35 10*3/uL — ABNORMAL HIGH (ref 3.10–9.50)
nRBC %: 0 /100 WBC (ref <=0.0)

## 2022-09-03 LAB — ECG 12-LEAD
Q-T Interval: 370 ms
QRS Duration: 74 ms
QTC Calculation (Bezet): 421 ms
R Axis: 105 degrees
T Axis: 41 degrees
Ventricular Rate: 78 {beats}/min

## 2022-09-03 MED ORDER — BUMETANIDE 1 MG PO TABS
1.0000 mg | ORAL_TABLET | ORAL | Status: DC
Start: 2022-09-03 — End: 2022-09-03

## 2022-09-03 MED ORDER — FUROSEMIDE 10 MG/ML IJ SOLN
20.0000 mg | Freq: Every day | INTRAMUSCULAR | Status: DC
Start: 2022-09-03 — End: 2022-09-03

## 2022-09-03 MED ORDER — BUMETANIDE 1 MG PO TABS
2.0000 mg | ORAL_TABLET | ORAL | Status: DC
Start: 2022-09-03 — End: 2022-09-05
  Administered 2022-09-03 – 2022-09-04 (×2): 2 mg via ORAL
  Filled 2022-09-03 (×2): qty 2

## 2022-09-03 MED ORDER — INSULIN GLARGINE 100 UNIT/ML SC SOLN
15.0000 [IU] | Freq: Every morning | SUBCUTANEOUS | Status: DC
Start: 2022-09-04 — End: 2022-09-05
  Administered 2022-09-04 – 2022-09-05 (×2): 15 [IU] via SUBCUTANEOUS
  Filled 2022-09-03 (×2): qty 15

## 2022-09-03 MED ORDER — FUROSEMIDE 10 MG/ML IJ SOLN
40.0000 mg | Freq: Every day | INTRAMUSCULAR | Status: AC
Start: 2022-09-03 — End: 2022-09-04
  Administered 2022-09-03 – 2022-09-04 (×2): 40 mg via INTRAVENOUS
  Filled 2022-09-03 (×2): qty 4

## 2022-09-03 MED ORDER — CARVEDILOL 12.5 MG PO TABS
12.5000 mg | ORAL_TABLET | Freq: Two times a day (BID) | ORAL | Status: DC
Start: 2022-09-03 — End: 2022-09-04
  Administered 2022-09-03: 12.5 mg via ORAL
  Filled 2022-09-03 (×2): qty 1

## 2022-09-03 NOTE — Progress Notes (Signed)
Pt's BG was 59, Rechecked by fingerstick was 57, Pt asymptomatic. Initiated Hypoglycemia protocol.  BG after was 73,97. Day RN made aware.

## 2022-09-03 NOTE — Progress Notes (Signed)
Nutritional Support Services  Nutrition Assessment    [name] 67 y.o. female   MRN: [id number]    Summary of Nutrition Recommendations:    Encourage po intake when appetite is down  Document daily meal intakes  Add Glucerna TID and document intakes  Each Glucerna shake provides 220 kcal and 10 gm protein.  Take daily wts, standing scale preferred  Monitor glucose    -----------------------------------------------------------------------------------------------------------------    D/w tech                                                        Assessment Data:   Referral Source: rn screen  Reason for Referral: MST-2    Nutrition: Met with pt at bedside with tech Victorino Dike to help interpret. Pt's tech was in the room and stated pt has been eating well. Pt's tech stated he has noticed the pt delay ordering food and eating her food when she gets it. Observed pt ate about 75% of her lunch. Pt stated everything is bland and has no flavor. Pt stated she does not eat salty foods because she will then retain water. Pt stated today she has eaten well, but sometimes she doesn't eat d/t having no appetite. Pt stated PTA it was the same. Pt stated her baseline is 2 meals/day and sometimes she will eat a granola bar. Pt stated UBW is 176 lbs and the last time she weighed that was 1-2 years ago. Pt stated she used to be heavier, but now is not. Did not see this in EMR. EMR shows wt fluctuations. Pt denied N/V/D/C, but stated she is allergic to shrimp. Pt stated she also used to go for walks, but now does not d/t her toe hurting. Pt agreeable to ONS.    Learning Needs: N/A    Hospital Admission: 67 y.o. female with a PMHx of essential hypertension, dyslipidemia, type 2 diabetes mellitus, coronary artery disease, paroxysmal A-fib, class I obesity, glaucoma and history of CHF with diastolic dysfunction  who presented with progressive shortness of breath.     Medical Hx:  has a past medical history of Congenital anomaly of  heart, Diabetes mellitus type 2 in obese, Diabetic retinopathy (06/28/2013), Glaucoma (06/01/2012), Hyperlipidemia, Hypertension, Obesity, and Venous insufficiency (09/17/2014).    PSH: has a past surgical history that includes Breast cyst excision (1993) and Tubal ligation.     Orders Placed This Encounter   Procedures    Adult diet Therapeutic/ Modified; Solid; Regular (IDDSI level 7); Thin (IDDSI level 0)     Intake:     09/02/22 1903 09/03/22 0600 09/03/22 0957   Intake (mL)   P.O. 120 mL 300 mL 180 mL   Percent Meal Consumed (%) 100%  --  100%   Data Collected by  --   --  Clinical Technician      09/03/22 1700   Intake (mL)   P.O. 360 mL   Percent Meal Consumed (%) 75%   Data Collected by Clinical Technician     ANTHROPOMETRIC  Height: 165.5 cm (5' 5.16")  Weight: 78.9 kg (173 lb 15.1 oz)  Weight Change: -1.25  Body mass index is 28.81 kg/m.         Weight Monitoring      Weight Weight Method BMI (calculated)   12/25/2017 86.637 kg  01/08/2018 85.276 kg      03/29/2018 87.998 kg      07/19/2018 85.73 kg   31.2 kg/m2    10/22/2018 83.915 kg   30.8 kg/m2    12/04/2018 85.73 kg   31.5 kg/m2    12/07/2018 83.462 kg  Stated  30.7 kg/m2    12/08/2018 81.012 kg  Standing Scale     01/21/2019 83.008 kg   30.5 kg/m2    04/14/2019 83.915 kg  Actual  30.8 kg/m2    05/09/2019 84.823 kg   31.2 kg/m2    09/16/2019 84.641 kg   30.9 kg/m2    01/21/2020 86.183 kg   31.7 kg/m2    03/23/2020 85.73 kg      08/03/2020 87.635 kg      10/09/2020 81.103 kg      10/14/2020 80.74 kg   28.8 kg/m2    10/15/2020 81.647 kg      10/23/2020 81.92 kg      01/22/2021 79.833 kg      04/20/2021 84.369 kg   30.1 kg/m2    04/21/2021 84.21 kg  Standing Scale     04/22/2021 84 kg  Standing Scale     05/03/2021 80.287 kg      07/20/2021 81.92 kg      08/13/2021 79.379 kg   29.2 kg/m2    10/19/2021 78.563 kg      11/15/2021 81.647 kg      11/19/2021 81.194 kg      01/05/2022 83.371 kg      01/07/2022 81.829 kg      02/08/2022 84.823 kg   32.4 kg/m2    02/11/2022 83.643 kg       04/01/2022 81.285 kg      05/23/2022 79.153 kg      05/24/2022 78.926 kg      06/13/2022 80.74 kg      08/23/2022 80.74 kg      09/02/2022 81.647 kg  Bed Scale  30 kg/m2     79.9 kg   29.3 kg/m2     79.9 kg   29.2 kg/m2    09/03/2022 78.9 kg  Standing Scale  28.8 kg/m2      Weight History Summary: 2.9% wt loss in 5 months. -not significant      ESTIMATED NEEDS    Total Daily Energy Needs: 1578 to 1972.5 kcal  Method for Calculating Energy Needs: 20 kcal - 25 kcal per kg  at 78.9 kg (Actual body weight)  Rationale: non-critical, non-obese       Total Daily Protein Needs: 94.68 to 102.57 g  Method for Calculating Protein Needs: 1.2 g - 1.3 g per kg at 78.9 kg (Actual body weight)  Rationale: non-critical, non-obese      Total Daily Fluid Needs: 1578 to 1972.5 ml  Method for Calculating Fluid Needs: 1 ml per kcal energy = 1578 to 1972.5 kcal  Rationale: or per MD      Pertinent Medications: lipitor, bumex, coreg, lovenox, lasix, lantus injection 20 units, lispro injecitn 1-8 units, zestril, procardia xl  PRN: glucose, D10W, klor-con m20, potassium chloride    IVF:  Infusion Meds[1]    Allergies[2]      Pertinent labs:  Recent Labs   Lab 09/03/22  0351 09/02/22  1340 09/02/22  1322   Sodium 142 142  --    Potassium 3.2* 4.1  --    Chloride 107 108  --    CO2 26 23  --    BUN  18 17  --    Creatinine 0.9 0.9  --    Glucose 59* 207*  --    Calcium 8.2* 8.9  --    GFR >60.0 >60.0  --    WBC 10.35*  --  12.67*   Hematocrit 38.3  --  43.8*   Hemoglobin 13.2  --  14.3       Physical Assessment: 07/27. Suspect age-related muscle loss.   Head: temple region: slight depression with decrease in muscle tone/resistance (mild muscle loss - temporalis), orbital region: slightly dark circles, somewhat hollow look, some decrease in bounce back of fat pads (mild fat loss), and buccal region: slight depression, somewhat sunken appearance, flat cheeks, decrease in bounce back of fat pads (moderate fat loss)  Upper Body: clavicle bone region:  some protrusion of the clavicle with decrease in muscle tone/resistance (mild muscle loss - pectoralis major), shoulder and acromion bone region: slight protrusion of acromion process, decrease in muscle tone/resistance (mild muscle loss - deltoid), upper arm region: some fat in pinch between fingers, but not ample (mild fat loss), dorsal hand region: slight depression, decrease in muscle tone/resistance (mild muscle loss - interosseous), and scapular bone region: slight protrusion of the scapula bone, mild depressions, decrease in muscle tone/resistance around scapula bone (mild muscle loss - trapezius, supraspinatus, infraspinatus)  Lower Body: anterior thigh and patellar region: slight depression along inner/outer thigh, patella slightly prominent, feel decrease in muscle tone/resistance in quadriceps to the patella (mild muscle loss - quadriceps) and posterior calf region: some shape to the bulb, but not well-developed, decrease in muscle tone/resistance (mild muscle loss - gastrocnemius)  Edema: edema: no sign of fluid accumulation   Skin: intact  GI function: WDL; LBM 07/26                                                                Nutrition Diagnosis      Inadequate Protein-energy intake related to decreased appetite as evidenced by need for ONS TID. -new                                                                Intervention     Nutrition recommendation - Please refer to top of note                                                                Monitoring/Evaluation       Goals:     Pt will meet >/=75% of EER via meals;  and ONS; by RD f/u. -new        Nutrition Risk Level: Moderate (will follow up at least 1 time per week and PRN)       Cyndra Numbers, MS, RD  Registered Dietitian  Spectralink [phone]         [1]   Current Facility-Administered  Medications   Medication Dose Route Frequency Last Rate   [2]   Allergies  Allergen Reactions    Shellfish-Derived Products

## 2022-09-03 NOTE — UM Notes (Addendum)
PATIENT NAME: [name],[name]   DOB: [birth year]     Admission Order   Admit to Observation (Order #[id number]) on 09/02/22       Admit Diagnosis: Acute pulmonary edema [J81.0]  Fluid overload [E87.70]  Acute on chronic congestive heart failure, unspecified heart failure type [I50.9]  Facility: [facility]    Reason for Admission 67 y.o. female with a PMHx of essential hypertension, dyslipidemia, type 2 diabetes mellitus, coronary artery disease, paroxysmal A-fib, class [name] obesity, glaucoma and history of CHF with diastolic dysfunction  presented with progressive shortness of breath.     Patient endorses progressive shortness of breath, exertional dyspnea, orthopnea, and paroxysmal nocturnal dyspnea, along with general fluid overload over the past several days.  She has noted some extremity swelling.  She has not been urinating well.  In the ED, satting 96 to 97% on 5 L/min via nasal cannula with increased respiratory rate to 32, slightly hypertensive.     Vital Signs  09/03/22 0946 -- -- -- -- 128/71 98 % 4 L/min --   09/03/22 07:07:06 97.9 F (36.6 C) -- 60 18 115/69 97 % 4 L/min --   09/03/22 03:47:50 99 F (37.2 C) -- 72 19 108/65 94 % 4 L/min 78.9 kg (173 lb 15.1 oz)   09/02/22 23:03:15 98.4 F (36.9 C) -- 72 17 124/64 95 % 4 L/min --   09/02/22 20:46:31 98.6 F (37 C) -- 79 18 126/72 95 % -- --   09/02/22 19:03:57 99.7 F (37.6 C) -- 72 18 134/70 94 % 4 L/min --   09/02/22 1754 98.2 F (36.8 C) -- 73 19 119/69 -- -- 79.9 kg (176 lb 2.4 oz)   09/02/22 17:20:18 98.2 F (36.8 C) -- 73 19 119/69 94 % -- --   09/02/22 1606 -- -- 75 18 148/67 97 % -- --   09/02/22 1501 -- -- 70 -- 148/72 96 % -- --   09/02/22 1424 -- -- -- -- -- -- -- 79.9 kg (176 lb 2.4 oz)   09/02/22 1415 -- -- 76 22 143/70 96 % -- --   09/02/22 1414 -- -- -- -- 143/70 -- -- --   09/02/22 1413 -- -- 76 -- -- 96 % -- --   09/02/22 1332 -- -- 77 32 Abnormal  -- 97 % -- --   09/02/22 1330 -- -- -- -- -- -- 4 L/min --   09/02/22  1314 -- -- 79 28 Abnormal  170/79 96 % -- --   09/02/22 1312 98 F (36.7 C) -- 84 -- 148/85 86 % Abnormal  -- 81.6 kg (180 lb)       Medications  Scheduled Meds:  Current Facility-Administered Medications   Medication Dose Route Frequency    atorvastatin  40 mg Oral Daily    bumetanide  2 mg Intravenous BID    enoxaparin  40 mg Subcutaneous Daily    insulin glargine  20 Units Subcutaneous QAM    insulin lispro  1-8 Units Subcutaneous TID AC    lisinopril  40 mg Oral Daily    timolol  1 drop Both Eyes Daily         Abnormal Labs   09/02/22 13:22 09/03/22 03:51   WBC 12.67 (H) 10.35 (H)   Hematocrit 43.8 (H) 38.3   Instrument Absolute Neutrophil Count 10.10 (H) 7.51 (H)   Monocytes Absolute Automated 1.02 (H) 1.06 (H)   Neutrophils Absolute 10.10 (H) 7.51 (H)  09/02/22 13:40 09/03/22 03:51   Glucose 207 (H) 59 (L)   Potassium 4.1 3.2 (L)   Calcium 8.9 8.2 (L)   Alkaline Phosphatase 135 (H) 100   Albumin 3.6 3.1 (L)     EKG Interpretation: A-fib     Lab 09/02/22  1322   hs Troponin 15.0*       Imaging CXR  1. Worsened pulmonary edema suspicious for CHF.  2. No pneumothorax.    ECHO  * Left ventricular chamber dimension, wall thickness and function are  normal. LVEF is 60-65%. No regional wall motion abnormalities. Diastolic  function is indeterminate.    * Right ventricle is normal in size. There is mildly reduced systolic  function.    * Right atrium is mildly dilated.    * The tricuspid valve is normal in structure with no significant stenosis.  There is moderate, central tricuspid regurgitation. RVSP is 35-51mmHg.    * No significant pericardial effusion.    Plan of Care  Acute on chronic diastolic heart failure  -Bumex 2 mg twice daily  -Monitor intake and output  -Monitor electrolytes and renal function  -Perform daily weights  -Fluid restriction 1500 mL per 24-hour.  -Consider repeat TTE this admission  -Consider AM consult to cardiology  -Hold Coreg     Hypertension  -Continue lisinopril and  nifedipine  -Hold Coreg  -IV hydralazine as needed for systolics greater than 160     Type 2 diabetes  -Lantus 20 units every morning (may need to increase)  -Medium dose sliding scale insulin     Dyslipidemia  -Continue atorvastatin     Class [name] obesity:  -Lifestyle modification and weight loss.     Paroxysmal A-fib  -Hold Coreg     Glaucoma  -Timolol drops        Patient has BMI=Body mass index is 29.95 kg/m.  Diagnosis: Obesity based on BMI criteria         Nutrition: Heart healthy diet    _____________________________    7.27.2024 concurrent review    #Acute on chronic HFpEF  #Fluid overload  -Patient presented with worsening shortness of breath  -proBNP on presentation was 3018  -CXR showed worsening pulmonary edema suspicious for CHF.  No pneumothorax  -Echocardiography done on 08/21/2021 showed LVEF of 60-65% with diastolic dysfunction  -Repeat echo today LVEF of 60-65% with indeterminate diastolic function, moderate tricuspid regurgitation  -Started on Bumex on admission  -Given Lasix 40 mg IV  -[facility] cardiology consulted, patient known to the service  -Fluid restriction to 1.5 L/day; salt restriction to less than 2 g daily  -Heart healthy diet     #Hypertension  - Hemodynamically stable  - Continue to monitor BP  - Continue on lisinopril 40 mg p.o. daily, carvedilol 12.5 mg p.o. twice daily  -Follow-up cardiology recommendations     #Hyperlipidemia  - Continue atorvastatin 40 mg p.o. daily     #History of CAD  #Paroxysmal A-fib  -Continue on Coreg as above  -Cardiology consulted     #Diabetes mellitus type 2  -Hemoglobin A1c recently checked 11 days ago was 7.4  -Continue on Lantus 20 units subcu  - Continue Accu-Chek and manage with SSI medium     #Hypokalemia  - Continue to monitor and replete per protocol     #Glaucoma  -Continue home timolol [name] drops    Nutrition: Heart healthy     Dispo: Discharge home pending cardiology evaluation and fluid overload improvement  Primary Coverage:  Payor: MEDICARE [employer] /  Plan: [employer] MEDICARE PPO / Product Type: MANAGED MEDICARE /     UTILIZATION REVIEW CONTACT: Elfredia Nevins, RN  Utilization Review   [facility]  [phone]  [phone]  Email: [email or web address]  Tax ID:  [id number]         NOTES TO REVIEWER:    This clinical review is based on/compiled from documentation provided by the treatment team within the patient's medical record.

## 2022-09-03 NOTE — Plan of Care (Addendum)
NURSING SHIFT NOTE     Patient: [name]  Day: 0      SHIFT EVENTS     Pt is A&Ox4.  Pt reported no pain. Pt able to walk to the bathroom with cane and with one assistant. On NC 4L SpO2 95%. Pt able to swallow pills without difficulty No significant event overnight. Plan of care discussed with patient; all concerns addressed. Safety bundle maintained. Purposeful hourly rounding completed.                   ASSESSMENT     Changes in assessment from patient's baseline this shift:    Neuro: No  CV: No  Pulm: No  Peripheral Vascular: No  HEENT: No  GI: No  BM during shift: No, Last BM: Last BM Date: 09/02/22  GU: No   Integ: No  MS: No    Pain: None  Pain Interventions: None  Medications Utilized: none      Mobility: PMP Activity: Step 6 - Walks in Room of [employer] (ft) (Step 6,7): 20 Feet           Lines     Patient Lines/Drains/Airways Status       Active Lines, Drains and Airways       Name Placement date Placement time Site Days    Peripheral IV 09/02/22 20 G Anterior;Proximal;Right Forearm 09/02/22  --  Forearm  1                         VITAL SIGNS     Vitals:    09/02/22 2303   BP: 124/64   Pulse: 72   Resp: 17   Temp: 98.4 F (36.9 C)   SpO2: 95%       Temp  Min: 98 F (36.7 C)  Max: 99.7 F (37.6 C)  Pulse  Min: 70  Max: 84  Resp  Min: 17  Max: 32  BP  Min: 119/69  Max: 170/79  SpO2  Min: 86 %  Max: 97 %      Intake/Output Summary (Last 24 hours) at 09/03/2022 0320  Last data filed at 09/02/2022 2303  Gross per 24 hour   Intake 120 ml   Output 300 ml   Net -180 ml            CARE PLAN        Problem: Pain interferes with ability to perform ADL  Goal: Pain at adequate level as identified by patient  09/03/2022 0319 by Willette Cluster, RN  Outcome: Progressing  Flowsheets (Taken 09/02/2022 1752 by Dustin Flock, RN)  Pain at adequate level as identified by patient:   Identify patient comfort function goal   Assess for risk of opioid induced respiratory depression, including snoring/sleep apnea. Alert  healthcare team of risk factors identified.   Assess pain on admission, during daily assessment and/or before any "as needed" intervention(s)   Reassess pain within 30-60 minutes of any procedure/intervention, per Pain Assessment, Intervention, Reassessment (AIR) Cycle   Evaluate if patient comfort function goal is met   Evaluate patient's satisfaction with pain management progress   Offer non-pharmacological pain management interventions   Consult/collaborate with Pain Service   Consult/collaborate with Physical Therapy, Occupational Therapy, and/or Speech Therapy   Include patient/patient care companion in decisions related to pain management as needed  09/03/2022 0318 by Willette Cluster, RN  Outcome: Progressing  09/03/2022 0318 by Willette Cluster, RN  Outcome:  Progressing  09/03/2022 0317 by Willette Cluster, RN  Outcome: Progressing  09/03/2022 0317 by Willette Cluster, RN  Outcome: Progressing     Problem: Side Effects from Pain Analgesia  Goal: Patient will experience minimal side effects of analgesic therapy  09/03/2022 0319 by Willette Cluster, RN  Outcome: Progressing  Flowsheets (Taken 09/03/2022 0319)  Patient will experience minimal side effects of analgesic therapy:   Monitor/assess patient's respiratory status (RR depth, effort, breath sounds)   Assess for changes in cognitive function   Prevent/manage side effects per LIP orders (i.e. nausea, vomiting, pruritus, constipation, urinary retention, etc.)   Evaluate for opioid-induced sedation with appropriate assessment tool (i.e. POSS)  09/03/2022 0318 by Willette Cluster, RN  Outcome: Progressing  09/03/2022 0318 by Willette Cluster, RN  Outcome: Progressing  09/03/2022 0317 by Willette Cluster, RN  Outcome: Progressing  09/03/2022 0317 by Willette Cluster, RN  Outcome: Progressing     Problem: Compromised Sensory Perception  Goal: Sensory Perception Interventions  09/03/2022 0319 by Willette Cluster, RN  Outcome: Progressing  Flowsheets (Taken 09/02/2022 2000)  Sensory Perception Interventions:  Offload heels, Pad bony prominences, Reposition q 2hrs/turn Clock, Q2 hour skin assessment under devices if present  09/03/2022 0318 by Willette Cluster, RN  Outcome: Progressing  09/03/2022 0318 by Willette Cluster, RN  Outcome: Progressing  09/03/2022 0317 by Willette Cluster, RN  Outcome: Progressing  09/03/2022 0317 by Willette Cluster, RN  Outcome: Progressing     Problem: Compromised Moisture  Goal: Moisture level Interventions  09/03/2022 0319 by Willette Cluster, RN  Outcome: Progressing  Flowsheets (Taken 09/02/2022 2000)  Moisture level Interventions: Moisture wicking products, Moisture barrier cream  09/03/2022 0318 by Willette Cluster, RN  Outcome: Progressing  09/03/2022 0318 by Willette Cluster, RN  Outcome: Progressing  09/03/2022 0317 by Willette Cluster, RN  Outcome: Progressing  09/03/2022 0317 by Willette Cluster, RN  Outcome: Progressing     Problem: Compromised Activity/Mobility  Goal: Activity/Mobility Interventions  09/03/2022 0319 by Willette Cluster, RN  Outcome: Progressing  Flowsheets (Taken 09/02/2022 2000)  Activity/Mobility Interventions: Pad bony prominences, TAP Seated positioning system when OOB, Promote PMP, Reposition q 2 hrs / turn clock, Offload heels  09/03/2022 0318 by Willette Cluster, RN  Outcome: Progressing  09/03/2022 0318 by Willette Cluster, RN  Outcome: Progressing  09/03/2022 0317 by Willette Cluster, RN  Outcome: Progressing  09/03/2022 0317 by Willette Cluster, RN  Outcome: Progressing     Problem: Moderate/High Fall Risk Score >5  Goal: Patient will remain free of falls  09/03/2022 0319 by Willette Cluster, RN  Outcome: Progressing  Flowsheets (Taken 09/02/2022 2000)  Moderate Risk (6-13):   MOD-Consider activation of bed alarm if appropriate   MOD-Apply bed exit alarm if patient is confused   MOD-Floor mat at bedside (where available) if appropriate   MOD-Consider a move closer to Nurses Station   MOD-Remain with patient during toileting   MOD-Place bedside commode and assistive devices out of sight when not in use   MOD-Re-orient confused  patients   MOD-Utilize diversion activities   MOD-Perform dangle, stand, walk (DSW) prior to mobilization   MOD-Request PT/OT consult order for patients with gait/mobility impairment   MOD-Use gait belt when appropriate   MOD-include family in multidisciplinary POC discussions   MOD- Consider video monitoring  09/03/2022 0318 by Willette Cluster, RN  Outcome: Progressing  09/03/2022 0318 by Willette Cluster, RN  Outcome: Progressing  09/03/2022 0317 by Willette Cluster, RN  Outcome: Progressing  09/03/2022 0317 by Willette Cluster, RN  Outcome: Progressing  Problem: Safety  Goal: Patient will be free from injury during hospitalization  09/03/2022 0319 by Willette Cluster, RN  Outcome: Progressing  Flowsheets (Taken 09/03/2022 [phone])  Patient will be free from injury during hospitalization:   Assess patient's risk for falls and implement fall prevention plan of care per policy   Provide and maintain safe environment   Use appropriate transfer methods   Ensure appropriate safety devices are available at the bedside   Include patient/ family/ care giver in decisions related to safety   Hourly rounding   Assess for patients risk for elopement and implement Elopement Risk Plan per policy   Provide alternative method of communication if needed (communication boards, writing)  09/03/2022 0318 by Willette Cluster, RN  Outcome: Progressing  09/03/2022 0318 by Willette Cluster, RN  Outcome: Progressing  09/03/2022 0317 by Willette Cluster, RN  Outcome: Progressing  Goal: Patient will be free from infection during hospitalization  09/03/2022 0319 by Willette Cluster, RN  Outcome: Progressing  Flowsheets (Taken 09/03/2022 0319)  Free from Infection during hospitalization:   Assess and monitor for signs and symptoms of infection   Monitor lab/diagnostic results   Monitor all insertion sites (i.e. indwelling lines, tubes, urinary catheters, and drains)   Encourage patient and family to use good hand hygiene technique  09/03/2022 0318 by Willette Cluster, RN  Outcome:  Progressing  09/03/2022 0318 by Willette Cluster, RN  Outcome: Progressing  09/03/2022 0317 by Willette Cluster, RN  Outcome: Progressing     Problem: Safety  Goal: Patient will be free from infection during hospitalization  09/03/2022 0319 by Willette Cluster, RN  Outcome: Progressing  Flowsheets (Taken 09/03/2022 0319)  Free from Infection during hospitalization:   Assess and monitor for signs and symptoms of infection   Monitor lab/diagnostic results   Monitor all insertion sites (i.e. indwelling lines, tubes, urinary catheters, and drains)   Encourage patient and family to use good hand hygiene technique  09/03/2022 0318 by Willette Cluster, RN  Outcome: Progressing  09/03/2022 0318 by Willette Cluster, RN  Outcome: Progressing  09/03/2022 0317 by Willette Cluster, RN  Outcome: Progressing

## 2022-09-03 NOTE — Plan of Care (Signed)
Problem: Pain interferes with ability to perform ADL  Goal: Pain at adequate level as identified by patient  Outcome: Progressing  Flowsheets (Taken 09/03/2022 0804)  Pain at adequate level as identified by patient:   Identify patient comfort function goal   Assess pain on admission, during daily assessment and/or before any "as needed" intervention(s)   Evaluate if patient comfort function goal is met     Problem: Safety  Goal: Patient will be free from injury during hospitalization  Outcome: Progressing  Flowsheets (Taken 09/03/2022 0804)  Patient will be free from injury during hospitalization:   Assess patient's risk for falls and implement fall prevention plan of care per policy   Provide and maintain safe environment     Problem: Everyday - Heart Failure  Goal: Stable Vital Signs and Fluid Balance  Outcome: Progressing  Flowsheets (Taken 09/03/2022 0804)  Stable Vital Signs and Fluid Balance:   Monitor labs and report abnormalities to physician   Monitor, assess vital signs and telemetry per policy  Goal: Mobility/Activity is Maintained at Optimal Level for Patient  Outcome: Progressing  Flowsheets (Taken 09/03/2022 0804)  Mobility/Activity is Maintained at Optimal Level for Patient:   Increase mobility as tolerated/progressive mobility protocol   Perform active/passive ROM  Goal: Nutritional Intake is Adequate  Outcome: Progressing  Flowsheets (Taken 09/03/2022 0804)  Nutritional Intake is Adequate: Fluid Restricction if needed

## 2022-09-03 NOTE — Progress Notes (Signed)
[facility]  PROGRESS NOTE      Patient: [name]  Date: 09/03/2022   LOS: 0 Days  Admission Date: 09/02/2022   MRN: [id number]  Attending: Cruz Condon, MD  When on service as the attending, please contact me on Epic Secure Chat from 7 AM - 7 PM for non-urgent issues. For urgent matters use XTend page from 7 AM - 7 PM.       ASSESSMENT/PLAN     [name] is a 67 y.o. female with PMHx significant for hypertension, hyperlipidemia, diabetes mellitus type 2, CAD, paroxysmal A-fib, obesity, glaucoma, HFpEF who presented for worsening shortness of breath and admitted for fluid overload      #Acute on chronic HFpEF  #Fluid overload  -Patient presented with worsening shortness of breath  -proBNP on presentation was 3018  -CXR showed worsening pulmonary edema suspicious for CHF.  No pneumothorax  -Echocardiography done on 08/21/2021 showed LVEF of 60-65% with diastolic dysfunction  -Repeat echo today LVEF of 60-65% with indeterminate diastolic function, moderate tricuspid regurgitation  -Started on Bumex on admission  -Given Lasix 40 mg IV  - cardiology consulted, patient known to the service  -Fluid restriction to 1.5 L/day; salt restriction to less than 2 g daily  -Heart healthy diet    #Hypertension  - Hemodynamically stable  - Continue to monitor BP  - Continue on lisinopril 40 mg p.o. daily, carvedilol 12.5 mg p.o. twice daily  -Follow-up cardiology recommendations    #Hyperlipidemia  - Continue atorvastatin 40 mg p.o. daily    #History of CAD  #Paroxysmal A-fib  -Continue on Coreg as above  -Cardiology consulted    #Diabetes mellitus type 2  -Hemoglobin A1c recently checked 11 days ago was 7.4  -Continue on Lantus 20 units subcu  - Continue Accu-Chek and manage with SSI medium    #Hypokalemia  - Continue to monitor and replete per protocol    #Glaucoma  -Continue home timolol I drops      Nutrition: Heart healthy          Patient has BMI=Body mass index is 28.81 kg/m.  Diagnosis: Overweight  based on BMI criteria            Recent Labs     09/03/22  0351 09/02/22  1340   Potassium 3.2* 4.1     Diagnosis: Hypokalemia       Code Status: Full Code    Dispo: Discharge home pending cardiology evaluation and fluid overload improvement    Family Contact: none    DVT Prophylaxis:   Current Facility-Administered Medications (Includes Only Anticoagulants, Misc. Hematological)   Medication Dose Route Last Admin    enoxaparin (LOVENOX) syringe 40 mg  40 mg Subcutaneous 40 mg at 09/03/22 0950          CHART  REVIEW & DISCUSSION     The following chart items were reviewed as of 1:47 PM on 09/03/22:  [x]  Lab Results [x]  Imaging Results   [x]  Problem List  [x]  Current Orders [x]  Current Medications  []  Allergies  []  Code Status []  Previous Notes   []  SDoH    The management and plan of care for this patient was discussed with the following specialty consultants:  [x]  Cardiology  []  Gastroenterology                 []  Infectious Disease  []  Pulmonology []  Neurology                []   Nephrology  []  Neurosurgery []  Orthopedic Surgery  []  Heme/Onc  []  General Surgery []  Psychiatry                                   []  Palliative    SUBJECTIVE     [name] was seen and examined at bedside today.  Lying in bed on 4 L of oxygen via NC.  Does not use oxygen at home  Has been receiving Bumex.  Started on Lasix 40 mg IV daily    MEDICATIONS     Current Facility-Administered Medications   Medication Dose Route Frequency    atorvastatin  40 mg Oral Daily    bumetanide  2 mg Oral Q Sun/Tue/Thu/Sat (AM)    carvedilol  12.5 mg Oral Q12H SCH    enoxaparin  40 mg Subcutaneous Daily    furosemide  40 mg Intravenous Daily    insulin glargine  20 Units Subcutaneous QAM    insulin lispro  1-8 Units Subcutaneous TID AC    lisinopril  40 mg Oral Daily    timolol  1 drop Both Eyes Daily       PHYSICAL EXAM     Vitals:    09/03/22 1112   BP: 135/74   Pulse: 82   Resp: 18   Temp: 97.9 F (36.6 C)   SpO2: 93%       Temperature: Temp   Min: 97.9 F (36.6 C)  Max: 99.7 F (37.6 C)  Pulse: Pulse  Min: 60  Max: 82  Respiratory: Resp  Min: 17  Max: 22  Non-Invasive BP: BP  Min: 108/65  Max: 148/67  Pulse Oximetry SpO2  Min: 93 %  Max: 98 %    Intake and Output Summary (Last 24 hours) at Date Time    Intake/Output Summary (Last 24 hours) at 09/03/2022 1347  Last data filed at 09/03/2022 1157  Gross per 24 hour   Intake 600 ml   Output 1600 ml   Net -1000 ml       GEN APPEARANCE: Lying in bed on 4 L of oxygen via NC;  A&OX3  HEENT: PERLA; EOMI; Conjunctiva Clear  NECK: Supple; No bruits  CVS: RRR, S1, S2; No M/G/R  LUNGS: Basal fine crackles appreciated on auscultation  ABD: Soft; No TTP; + Normoactive BS  EXT: No edema; Pulses 2+ and intact  NEURO: CN 2-12 intact; No Focal neurological deficits  MENTAL STATUS:     LABS     Recent Labs   Lab 09/03/22  0351 09/02/22  1322   WBC 10.35* 12.67*   RBC 4.27 4.83   Hemoglobin 13.2 14.3   Hematocrit 38.3 43.8*   MCV 89.7 90.7   Platelet Count 175 203       Recent Labs   Lab 09/03/22  0351 09/02/22  1340   Sodium 142 142   Potassium 3.2* 4.1   Chloride 107 108   CO2 26 23   BUN 18 17   Creatinine 0.9 0.9   Glucose 59* 207*   Calcium 8.2* 8.9       Recent Labs   Lab 09/03/22  0351 09/02/22  1340   ALT 16 22   AST (SGOT) 15 24   Bilirubin, Total 1.1 1.0   Albumin 3.1* 3.6   Alkaline Phosphatase 100 135*       Recent Labs   Lab 09/02/22  1613  09/02/22  1322   hs Troponin 13.7 15.0*             Microbiology Results (last 15 days)       Procedure Component Value Units Date/Time    COVID-19 and Influenza ([place]) (symptomatic) [[id number]]  (Normal) Collected: 09/02/22 1328    Order Status: Completed Specimen: Swab from Anterior Nares Updated: 09/02/22 1408     SARS-CoV-2 (COVID-19) RNA Not Detected     Influenza A RNA Not Detected     Influenza B RNA Not Detected    Narrative:      A result of "Detected" indicates POSITIVE for the presence of viral RNA  A result of "Not Detected" indicates NEGATIVE for the presence of  viral RNA    Test performed using the Roche cobas [place] SARS-CoV-2 & Influenza A/B assay. This is a multiplex real-time RT-PCR assay for the detection of SARS-CoV-2, influenza A, and influenza B virus RNA. Viral nucleic acids may persist in vivo, independent of viability. Detection of viral nucleic acid does not imply the presence of infectious virus, or that virus nucleic acid is the cause of clinical symptoms. Negative results do not preclude SARS-CoV-2, influenza A, and/or influenza B infection and should not be used as the sole basis for diagnosis, treatment or other patient management decisions. Invalid results may be due to inhibiting substances in the specimen and recollection should occur.              RADIOLOGY     Chest AP Portable    Result Date: 09/02/2022  1. Worsened pulmonary edema suspicious for CHF. 2. No pneumothorax. Nelta Numbers, MD 09/02/2022 1:42 PM   Echo Results       None          No results found for this or any previous visit.    Signed,  Cruz Condon, MD  1:47 PM 09/03/2022

## 2022-09-03 NOTE — Plan of Care (Signed)
Problem: Pain interferes with ability to perform ADL  Goal: Pain at adequate level as identified by patient  09/03/2022 0358 by Willette Cluster, RN  Flowsheets (Taken 09/02/2022 1752 by Dustin Flock, RN)  Pain at adequate level as identified by patient:   Identify patient comfort function goal   Assess for risk of opioid induced respiratory depression, including snoring/sleep apnea. Alert healthcare team of risk factors identified.   Assess pain on admission, during daily assessment and/or before any "as needed" intervention(s)   Reassess pain within 30-60 minutes of any procedure/intervention, per Pain Assessment, Intervention, Reassessment (AIR) Cycle   Evaluate if patient comfort function goal is met   Evaluate patient's satisfaction with pain management progress   Offer non-pharmacological pain management interventions   Consult/collaborate with Pain Service   Consult/collaborate with Physical Therapy, Occupational Therapy, and/or Speech Therapy   Include patient/patient care companion in decisions related to pain management as needed  09/03/2022 0357 by Willette Cluster, RN  Outcome: Progressing  09/03/2022 0319 by Willette Cluster, RN  Outcome: Progressing  Flowsheets (Taken 09/02/2022 1752 by Dustin Flock, RN)  Pain at adequate level as identified by patient:   Identify patient comfort function goal   Assess for risk of opioid induced respiratory depression, including snoring/sleep apnea. Alert healthcare team of risk factors identified.   Assess pain on admission, during daily assessment and/or before any "as needed" intervention(s)   Reassess pain within 30-60 minutes of any procedure/intervention, per Pain Assessment, Intervention, Reassessment (AIR) Cycle   Evaluate if patient comfort function goal is met   Evaluate patient's satisfaction with pain management progress   Offer non-pharmacological pain management interventions   Consult/collaborate with Pain Service   Consult/collaborate with Physical Therapy,  Occupational Therapy, and/or Speech Therapy   Include patient/patient care companion in decisions related to pain management as needed  09/03/2022 0318 by Willette Cluster, RN  Outcome: Progressing  09/03/2022 0318 by Willette Cluster, RN  Outcome: Progressing  09/03/2022 0317 by Willette Cluster, RN  Outcome: Progressing  09/03/2022 0317 by Willette Cluster, RN  Outcome: Progressing     Problem: Side Effects from Pain Analgesia  Goal: Patient will experience minimal side effects of analgesic therapy  09/03/2022 0358 by Willette Cluster, RN  Flowsheets (Taken 09/03/2022 [phone])  Patient will experience minimal side effects of analgesic therapy:   Monitor/assess patient's respiratory status (RR depth, effort, breath sounds)   Assess for changes in cognitive function   Prevent/manage side effects per LIP orders (i.e. nausea, vomiting, pruritus, constipation, urinary retention, etc.)   Evaluate for opioid-induced sedation with appropriate assessment tool (i.e. POSS)  09/03/2022 0357 by Willette Cluster, RN  Outcome: Progressing  09/03/2022 0319 by Willette Cluster, RN  Outcome: Progressing  Flowsheets (Taken 09/03/2022 0319)  Patient will experience minimal side effects of analgesic therapy:   Monitor/assess patient's respiratory status (RR depth, effort, breath sounds)   Assess for changes in cognitive function   Prevent/manage side effects per LIP orders (i.e. nausea, vomiting, pruritus, constipation, urinary retention, etc.)   Evaluate for opioid-induced sedation with appropriate assessment tool (i.e. POSS)  09/03/2022 0318 by Willette Cluster, RN  Outcome: Progressing  09/03/2022 0318 by Willette Cluster, RN  Outcome: Progressing  09/03/2022 0317 by Willette Cluster, RN  Outcome: Progressing  09/03/2022 0317 by Willette Cluster, RN  Outcome: Progressing     Problem: Compromised Sensory Perception  Goal: Sensory Perception Interventions  09/03/2022 0358 by Willette Cluster, RN  Flowsheets (Taken 09/02/2022 2000)  Sensory Perception Interventions: Offload heels, Pad  bony  prominences, Reposition q 2hrs/turn Clock, Q2 hour skin assessment under devices if present  09/03/2022 0357 by Willette Cluster, RN  Outcome: Progressing  09/03/2022 0319 by Willette Cluster, RN  Outcome: Progressing  Flowsheets (Taken 09/02/2022 2000)  Sensory Perception Interventions: Offload heels, Pad bony prominences, Reposition q 2hrs/turn Clock, Q2 hour skin assessment under devices if present  09/03/2022 0318 by Willette Cluster, RN  Outcome: Progressing  09/03/2022 0318 by Willette Cluster, RN  Outcome: Progressing  09/03/2022 0317 by Willette Cluster, RN  Outcome: Progressing  09/03/2022 0317 by Willette Cluster, RN  Outcome: Progressing     Problem: Compromised Moisture  Goal: Moisture level Interventions  09/03/2022 0358 by Willette Cluster, RN  Flowsheets (Taken 09/02/2022 2000)  Moisture level Interventions: Moisture wicking products, Moisture barrier cream  09/03/2022 0357 by Willette Cluster, RN  Outcome: Progressing  09/03/2022 0319 by Willette Cluster, RN  Outcome: Progressing  Flowsheets (Taken 09/02/2022 2000)  Moisture level Interventions: Moisture wicking products, Moisture barrier cream  09/03/2022 0318 by Willette Cluster, RN  Outcome: Progressing  09/03/2022 0318 by Willette Cluster, RN  Outcome: Progressing  09/03/2022 0317 by Willette Cluster, RN  Outcome: Progressing  09/03/2022 0317 by Willette Cluster, RN  Outcome: Progressing     Problem: Compromised Activity/Mobility  Goal: Activity/Mobility Interventions  09/03/2022 0358 by Willette Cluster, RN  Flowsheets (Taken 09/02/2022 2000)  Activity/Mobility Interventions: Pad bony prominences, TAP Seated positioning system when OOB, Promote PMP, Reposition q 2 hrs / turn clock, Offload heels  09/03/2022 0357 by Willette Cluster, RN  Outcome: Progressing  09/03/2022 0319 by Willette Cluster, RN  Outcome: Progressing  Flowsheets (Taken 09/02/2022 2000)  Activity/Mobility Interventions: Pad bony prominences, TAP Seated positioning system when OOB, Promote PMP, Reposition q 2 hrs / turn clock, Offload heels  09/03/2022 0318 by  Willette Cluster, RN  Outcome: Progressing  09/03/2022 0318 by Willette Cluster, RN  Outcome: Progressing  09/03/2022 0317 by Willette Cluster, RN  Outcome: Progressing  09/03/2022 0317 by Willette Cluster, RN  Outcome: Progressing     Problem: Moderate/High Fall Risk Score >5  Goal: Patient will remain free of falls  09/03/2022 0358 by Willette Cluster, RN  Flowsheets (Taken 09/02/2022 2000)  Moderate Risk (6-13):   MOD-Consider activation of bed alarm if appropriate   MOD-Apply bed exit alarm if patient is confused   MOD-Floor mat at bedside (where available) if appropriate   MOD-Consider a move closer to Nurses Station   MOD-Remain with patient during toileting   MOD-Place bedside commode and assistive devices out of sight when not in use   MOD-Re-orient confused patients   MOD-Utilize diversion activities   MOD-Perform dangle, stand, walk (DSW) prior to mobilization   MOD-Request PT/OT consult order for patients with gait/mobility impairment   MOD-Use gait belt when appropriate   MOD-include family in multidisciplinary POC discussions   MOD- Consider video monitoring  09/03/2022 0357 by Willette Cluster, RN  Outcome: Progressing  09/03/2022 0319 by Willette Cluster, RN  Outcome: Progressing  Flowsheets (Taken 09/02/2022 2000)  Moderate Risk (6-13):   MOD-Consider activation of bed alarm if appropriate   MOD-Apply bed exit alarm if patient is confused   MOD-Floor mat at bedside (where available) if appropriate   MOD-Consider a move closer to Nurses Station   MOD-Remain with patient during toileting   MOD-Place bedside commode and assistive devices out of sight when not in use   MOD-Re-orient confused patients   MOD-Utilize diversion activities   MOD-Perform dangle, stand, walk (DSW) prior to mobilization  MOD-Request PT/OT consult order for patients with gait/mobility impairment   MOD-Use gait belt when appropriate   MOD-include family in multidisciplinary POC discussions   MOD- Consider video monitoring  09/03/2022 0318 by Willette Cluster,  RN  Outcome: Progressing  09/03/2022 0318 by Willette Cluster, RN  Outcome: Progressing  09/03/2022 0317 by Willette Cluster, RN  Outcome: Progressing  09/03/2022 0317 by Willette Cluster, RN  Outcome: Progressing     Problem: Safety  Goal: Patient will be free from injury during hospitalization  09/03/2022 0358 by Willette Cluster, RN  Flowsheets (Taken 09/03/2022 [phone])  Patient will be free from injury during hospitalization:   Assess patient's risk for falls and implement fall prevention plan of care per policy   Provide and maintain safe environment   Use appropriate transfer methods   Ensure appropriate safety devices are available at the bedside   Include patient/ family/ care giver in decisions related to safety   Hourly rounding   Assess for patients risk for elopement and implement Elopement Risk Plan per policy   Provide alternative method of communication if needed (communication boards, writing)  09/03/2022 0357 by Willette Cluster, RN  Outcome: Progressing  09/03/2022 0319 by Willette Cluster, RN  Outcome: Progressing  Flowsheets (Taken 09/03/2022 0319)  Patient will be free from injury during hospitalization:   Assess patient's risk for falls and implement fall prevention plan of care per policy   Provide and maintain safe environment   Use appropriate transfer methods   Ensure appropriate safety devices are available at the bedside   Include patient/ family/ care giver in decisions related to safety   Hourly rounding   Assess for patients risk for elopement and implement Elopement Risk Plan per policy   Provide alternative method of communication if needed (communication boards, writing)  09/03/2022 0318 by Willette Cluster, RN  Outcome: Progressing  09/03/2022 0318 by Willette Cluster, RN  Outcome: Progressing  09/03/2022 0317 by Willette Cluster, RN  Outcome: Progressing  Goal: Patient will be free from infection during hospitalization  09/03/2022 0358 by Willette Cluster, RN  Flowsheets (Taken 09/03/2022 [phone])  Free from Infection during  hospitalization:   Assess and monitor for signs and symptoms of infection   Monitor lab/diagnostic results   Monitor all insertion sites (i.e. indwelling lines, tubes, urinary catheters, and drains)   Encourage patient and family to use good hand hygiene technique  09/03/2022 0357 by Willette Cluster, RN  Outcome: Progressing  09/03/2022 0319 by Willette Cluster, RN  Outcome: Progressing  Flowsheets (Taken 09/03/2022 0319)  Free from Infection during hospitalization:   Assess and monitor for signs and symptoms of infection   Monitor lab/diagnostic results   Monitor all insertion sites (i.e. indwelling lines, tubes, urinary catheters, and drains)   Encourage patient and family to use good hand hygiene technique  09/03/2022 0318 by Willette Cluster, RN  Outcome: Progressing  09/03/2022 0318 by Willette Cluster, RN  Outcome: Progressing  09/03/2022 0317 by Willette Cluster, RN  Outcome: Progressing     Problem: Safety  Goal: Patient will be free from infection during hospitalization  09/03/2022 0358 by Willette Cluster, RN  Flowsheets (Taken 09/03/2022 [phone])  Free from Infection during hospitalization:   Assess and monitor for signs and symptoms of infection   Monitor lab/diagnostic results   Monitor all insertion sites (i.e. indwelling lines, tubes, urinary catheters, and drains)   Encourage patient and family to use good hand hygiene technique  09/03/2022 0357 by Willette Cluster, RN  Outcome: Progressing  09/03/2022  6962 by Willette Cluster, RN  Outcome: Progressing  Flowsheets (Taken 09/03/2022 0319)  Free from Infection during hospitalization:   Assess and monitor for signs and symptoms of infection   Monitor lab/diagnostic results   Monitor all insertion sites (i.e. indwelling lines, tubes, urinary catheters, and drains)   Encourage patient and family to use good hand hygiene technique  09/03/2022 0318 by Willette Cluster, RN  Outcome: Progressing  09/03/2022 0318 by Willette Cluster, RN  Outcome: Progressing  09/03/2022 0317 by Willette Cluster, RN  Outcome:  Progressing     Problem: Day of Admission - Heart Failure  Goal: Heart Failure Admission  09/03/2022 0358 by Willette Cluster, RN  Flowsheets (Taken 09/03/2022 [phone])  Heart Failure Admission:   Standing Weight on admission, if unable to stand zero the bed and use the bed scale   Fluid restriction   Oxygen as needed   Strict Intake/Output   Vital signs and telemetry per policy   Assess for swelling/edema and document   Initiate education with patient and caregiver using CHF Warning Zones and Educational Videos ([employer] or [employer])  09/03/2022 0357 by Willette Cluster, RN  Outcome: Progressing     Problem: Everyday - Heart Failure  Goal: Stable Vital Signs and Fluid Balance  09/03/2022 0358 by Willette Cluster, RN  Flowsheets (Taken 09/03/2022 [phone])  Stable Vital Signs and Fluid Balance:   Daily Standing Weights in the morning using the same scale, after using the bathroom and before breadfast.  If unable to stand, zero the bed and use the bed scale   Monitor, assess vital signs and telemetry per policy   Monitor labs and report abnormalities to physician   Fluid Restriction   Strict Intake/Output   Assess for swelling/edema   Wean oxygen as needed if appropriate  09/03/2022 0357 by Willette Cluster, RN  Outcome: Progressing  Goal: Mobility/Activity is Maintained at Optimal Level for Patient  09/03/2022 0358 by Willette Cluster, RN  Flowsheets (Taken 09/03/2022 [phone])  Mobility/Activity is Maintained at Optimal Level for Patient:   Increase mobility as tolerated/progressive mobility protocol   Maintain SCD's as Ordered   Perform active/passive ROM   Reposition patient every 2 hours and as needed unless able to reposition self   Assess for changes in respiratory status, level of consciousness and/or development of fatigue   Consult with Physical Therapy and/or Occupational Therapy  09/03/2022 0357 by Willette Cluster, RN  Outcome: Progressing  Goal: Nutritional Intake is Adequate  09/03/2022 0358 by Willette Cluster, RN  Flowsheets (Taken 09/03/2022  [phone])  Nutritional Intake is Adequate:   Cardiac diet-2 gm Sodium   Fluid Restricction if needed   Consult/Collaborate with Nutritionist   Patient and family teaching on low sodium diet   Assess appetite,anorexia and amount of meal/food tolerated   Encourage/perform oral hygiene as appropriate  09/03/2022 0357 by Willette Cluster, RN  Outcome: Progressing  Goal: Teaching-Using CHF Warning Zones and Educational Videos  09/03/2022 0358 by Willette Cluster, RN  Flowsheets (Taken 09/03/2022 [phone])  Teaching-Using CHF Warning Zones and Educational Videos:   Signs & Symptoms of CHF   Daily Standing Weights & record   CHF Warning Zones and when to call for help   Medications   Fluid Restriction if appropriate   Sodium Restriction   Document in the Education Tab in EPIC with Teach-back  09/03/2022 0357 by Willette Cluster, RN  Outcome: Progressing     Problem: Everyday - Heart Failure  Goal: Mobility/Activity is Maintained at Optimal Level for Patient  09/03/2022 0358 by Willette Cluster, RN  Flowsheets (Taken 09/03/2022 [phone])  Mobility/Activity is Maintained at Optimal Level for Patient:   Increase mobility as tolerated/progressive mobility protocol   Maintain SCD's as Ordered   Perform active/passive ROM   Reposition patient every 2 hours and as needed unless able to reposition self   Assess for changes in respiratory status, level of consciousness and/or development of fatigue   Consult with Physical Therapy and/or Occupational Therapy  09/03/2022 0357 by Willette Cluster, RN  Outcome: Progressing     Problem: Everyday - Heart Failure  Goal: Stable Vital Signs and Fluid Balance  09/03/2022 0358 by Willette Cluster, RN  Flowsheets (Taken 09/03/2022 [phone])  Stable Vital Signs and Fluid Balance:   Daily Standing Weights in the morning using the same scale, after using the bathroom and before breadfast.  If unable to stand, zero the bed and use the bed scale   Monitor, assess vital signs and telemetry per policy   Monitor labs and report abnormalities to  physician   Fluid Restriction   Strict Intake/Output   Assess for swelling/edema   Wean oxygen as needed if appropriate  09/03/2022 0357 by Willette Cluster, RN  Outcome: Progressing  Goal: Mobility/Activity is Maintained at Optimal Level for Patient  09/03/2022 0358 by Willette Cluster, RN  Flowsheets (Taken 09/03/2022 [phone])  Mobility/Activity is Maintained at Optimal Level for Patient:   Increase mobility as tolerated/progressive mobility protocol   Maintain SCD's as Ordered   Perform active/passive ROM   Reposition patient every 2 hours and as needed unless able to reposition self   Assess for changes in respiratory status, level of consciousness and/or development of fatigue   Consult with Physical Therapy and/or Occupational Therapy  09/03/2022 0357 by Willette Cluster, RN  Outcome: Progressing  Goal: Nutritional Intake is Adequate  09/03/2022 0358 by Willette Cluster, RN  Flowsheets (Taken 09/03/2022 [phone])  Nutritional Intake is Adequate:   Cardiac diet-2 gm Sodium   Fluid Restricction if needed   Consult/Collaborate with Nutritionist   Patient and family teaching on low sodium diet   Assess appetite,anorexia and amount of meal/food tolerated   Encourage/perform oral hygiene as appropriate  09/03/2022 0357 by Willette Cluster, RN  Outcome: Progressing  Goal: Teaching-Using CHF Warning Zones and Educational Videos  09/03/2022 0358 by Willette Cluster, RN  Flowsheets (Taken 09/03/2022 [phone])  Teaching-Using CHF Warning Zones and Educational Videos:   Signs & Symptoms of CHF   Daily Standing Weights & record   CHF Warning Zones and when to call for help   Medications   Fluid Restriction if appropriate   Sodium Restriction   Document in the Education Tab in EPIC with Teach-back  09/03/2022 0357 by Willette Cluster, RN  Outcome: Progressing     Problem: Everyday - Heart Failure  Goal: Stable Vital Signs and Fluid Balance  09/03/2022 0358 by Willette Cluster, RN  Flowsheets (Taken 09/03/2022 [phone])  Stable Vital Signs and Fluid Balance:   Daily Standing Weights  in the morning using the same scale, after using the bathroom and before breadfast.  If unable to stand, zero the bed and use the bed scale   Monitor, assess vital signs and telemetry per policy   Monitor labs and report abnormalities to physician   Fluid Restriction   Strict Intake/Output   Assess for swelling/edema   Wean oxygen as needed if appropriate  09/03/2022 0357 by Willette Cluster, RN  Outcome: Progressing  Goal: Mobility/Activity is Maintained at Optimal Level for Patient  09/03/2022 0358 by Willette Cluster, RN  Flowsheets (Taken 09/03/2022 [phone])  Mobility/Activity is Maintained at Optimal Level for Patient:   Increase mobility as tolerated/progressive mobility protocol   Maintain SCD's as Ordered   Perform active/passive ROM   Reposition patient every 2 hours and as needed unless able to reposition self   Assess for changes in respiratory status, level of consciousness and/or development of fatigue   Consult with Physical Therapy and/or Occupational Therapy  09/03/2022 0357 by Willette Cluster, RN  Outcome: Progressing  Goal: Nutritional Intake is Adequate  09/03/2022 0358 by Willette Cluster, RN  Flowsheets (Taken 09/03/2022 [phone])  Nutritional Intake is Adequate:   Cardiac diet-2 gm Sodium   Fluid Restricction if needed   Consult/Collaborate with Nutritionist   Patient and family teaching on low sodium diet   Assess appetite,anorexia and amount of meal/food tolerated   Encourage/perform oral hygiene as appropriate  09/03/2022 0357 by Willette Cluster, RN  Outcome: Progressing  Goal: Teaching-Using CHF Warning Zones and Educational Videos  09/03/2022 0358 by Willette Cluster, RN  Flowsheets (Taken 09/03/2022 [phone])  Teaching-Using CHF Warning Zones and Educational Videos:   Signs & Symptoms of CHF   Daily Standing Weights & record   CHF Warning Zones and when to call for help   Medications   Fluid Restriction if appropriate   Sodium Restriction   Document in the Education Tab in EPIC with Teach-back  09/03/2022 0357 by Willette Cluster,  RN  Outcome: Progressing

## 2022-09-04 DIAGNOSIS — I509 Heart failure, unspecified: Secondary | ICD-10-CM

## 2022-09-04 LAB — CBC
Absolute nRBC: 0 10*3/uL (ref <=0.00)
Hematocrit: 41.3 % (ref 34.7–43.7)
Hemoglobin: 14 g/dL (ref 11.4–14.8)
MCH: 30.4 pg (ref 25.1–33.5)
MCHC: 33.9 g/dL (ref 31.5–35.8)
MCV: 89.6 fL (ref 78.0–96.0)
MPV: 11.2 fL (ref 8.9–12.5)
Platelet Count: 176 10*3/uL (ref 142–346)
RBC: 4.61 10*6/uL (ref 3.90–5.10)
RDW: 14 % (ref 11–15)
WBC: 9.91 10*3/uL — ABNORMAL HIGH (ref 3.10–9.50)
nRBC %: 0 /100 WBC (ref <=0.0)

## 2022-09-04 LAB — BASIC METABOLIC PANEL
Anion Gap: 10 (ref 5.0–15.0)
BUN: 21 mg/dL (ref 7–21)
CO2: 29 mEq/L (ref 17–29)
Calcium: 8.5 mg/dL (ref 8.5–10.5)
Chloride: 102 mEq/L (ref 99–111)
Creatinine: 1.1 mg/dL — ABNORMAL HIGH (ref 0.4–1.0)
GFR: 54.4 mL/min/{1.73_m2} — ABNORMAL LOW (ref >=60.0)
Glucose: 141 mg/dL — ABNORMAL HIGH (ref 70–100)
Potassium: 3.8 mEq/L (ref 3.5–5.3)
Sodium: 141 mEq/L (ref 135–145)

## 2022-09-04 LAB — WHOLE BLOOD GLUCOSE POCT
Whole Blood Glucose POCT: 128 mg/dL — ABNORMAL HIGH (ref 70–100)
Whole Blood Glucose POCT: 281 mg/dL — ABNORMAL HIGH (ref 70–100)
Whole Blood Glucose POCT: 156 mg/dL — ABNORMAL HIGH (ref 70–100)

## 2022-09-04 MED ORDER — CARVEDILOL 6.25 MG PO TABS
6.2500 mg | ORAL_TABLET | Freq: Two times a day (BID) | ORAL | Status: DC
Start: 2022-09-04 — End: 2022-09-05
  Administered 2022-09-04 – 2022-09-05 (×3): 6.25 mg via ORAL
  Filled 2022-09-04 (×3): qty 1

## 2022-09-04 MED ORDER — EMPAGLIFLOZIN 10 MG PO TABS
25.0000 mg | ORAL_TABLET | Freq: Every day | ORAL | Status: DC
Start: 2022-09-04 — End: 2022-09-05
  Administered 2022-09-04 – 2022-09-05 (×2): 25 mg via ORAL
  Filled 2022-09-04 (×2): qty 3

## 2022-09-04 MED ORDER — DABIGATRAN ETEXILATE MESYLATE 150 MG PO CAPS
150.0000 mg | ORAL_CAPSULE | Freq: Two times a day (BID) | ORAL | Status: DC
Start: 2022-09-04 — End: 2022-09-05
  Administered 2022-09-04 – 2022-09-05 (×2): 150 mg via ORAL
  Filled 2022-09-04 (×3): qty 1

## 2022-09-04 MED ORDER — BUMETANIDE 1 MG PO TABS
1.0000 mg | ORAL_TABLET | ORAL | Status: DC
Start: 2022-09-05 — End: 2022-09-05
  Administered 2022-09-05: 1 mg via ORAL
  Filled 2022-09-04: qty 1

## 2022-09-04 NOTE — Consults (Signed)
CARDIOLOGY CONSULT NOTE    Carolynne Edouard, MD   [facility]  Office phone:  [phone]     [facility] phone [phone]  [facility] phone [phone]    Date:  09/04/2022  10:09 AM    Patient:  [name].  DOB: 12/09/1955.  67 y.o.  female , MRN [id number]   Attending:  Cruz Condon, MD   Admission date:  09/02/2022       REASON FOR CONSULT   CHF    ASSESSMENT:   67 y.o. female with a history of non-obstructive CAD, AF, HFpEF, HTN, HL, DM, CKD, uterine prolapse, who presents with acute on chronic HFpEF.     PLAN:   HFpEF. Acute on chronic. She has been getting both bumex 2mg  IV and PO and lasix 40mg  IV. D/c IV lasix. She is on bumex 1mg  BID 4 days a week and bumex 1mg  daily 3 days a week. She is also on Jardiance which I restarted today.   She has not been weighing herself at home. Daughter thinks her normal weight is ~178lbs. It is 167 lbs this morning. Instructed patient on daily weights.   Cont outpatient diuretic regimen upon discharge and follow daily standing weights.   Afib. In AF with controlled to slow ventricular response. Recommend decreasing carvedilol to 6.25mg  BID.  On Pradaxa. Has not received Pradaxa since her admission 2 days ago ago. Restarted today.  Tricuspid regurg. Chronic mild-moderate.   CAD. Non-obstructive disease. Cont medical management.   HTN. BPs controlled. Monitor with medication changes.  CKD. Suspect baseline Cr is ~1.3. monitor with diuresis.     HISTORY OF PRESENT ILLNESS:   67 y.o. female with a history of non-obstructive CAD, AF, HFpEF, HTN, HL, DM, CKD, uterine prolapse, who presents with acute on chronic HFpEF.     She is normally followed by Dr. Fransisco Beau for HFpEF. It appears she has had issues balancing her HF and renal function. She was previously on lasix 60mg  daily and was changed to bumex 1mg  BID earlier this year due to worsening orthopnea. Symptoms improved but her renal function worsened so her bumex was decreased to 1mg  daily  and spironolactone was discontinued. She had issues with fluid retention and so her bumex was increased to 1mg  BID 4 days/week and 1mg  daily 3 days/week. She seemed to be doing well on this regimen but started to have worsening SOB/orthopnea about a week ago. They checked her O2 sat on Friday and it was 88% so she came to the ER. She does not weigh herself regularly at home. No chest pain, palpitations.     This H&P was obtained with the help of her daughter who assisted with interpretation.       DIAGNOSTICS:   Reports reviewed:    Independent review of telemetry and EKG shows:      Echo 09/03/22 showed EF of 60-65%, reduced RV function, mod TR, mild pulm HTN      MEDICATIONS:    INFUSION MEDS:      SCHEDULED MEDS:     Current Facility-Administered Medications   Medication Dose Route Frequency    atorvastatin  40 mg Oral Daily    bumetanide  2 mg Oral Q Sun/Tue/Thu/Sat (AM)    carvedilol  12.5 mg Oral Q12H SCH    enoxaparin  40 mg Subcutaneous Daily    insulin glargine  15 Units Subcutaneous QAM    insulin lispro  1-8 Units Subcutaneous TID AC  lisinopril  40 mg Oral Daily    timolol  1 drop Both Eyes Daily      PRN MEDS:   acetaminophen, benzocaine-menthol, benzonatate, carboxymethylcellulose sodium, dextrose **OR** dextrose **OR** dextrose **OR** glucagon (rDNA), hydrALAZINE, magnesium sulfate, melatonin, naloxone, potassium & sodium phosphates, potassium chloride **AND** potassium chloride, saline    ALLERGIES:   Allergies[1]     PAST MEDICAL HISTORY:     Past Medical History:   Diagnosis Date    Congenital anomaly of heart     Diabetes mellitus type 2 in obese     Diabetic retinopathy 06/28/2013    Of the right eye, per patient    Glaucoma 06/01/2012    Hyperlipidemia     Hypertension     Obesity     Venous insufficiency 09/17/2014    Followed at [facility].  Wears compression stockings.      Past Surgical History:   Procedure Laterality Date    BREAST CYST EXCISION  1993    bilateral    TUBAL  LIGATION         SOCIAL HISTORY:     Social History     Tobacco Use    Smoking status: Never    Smokeless tobacco: Never   Substance Use Topics    Alcohol use: No       FAMILY HISTORY:   family history includes Cancer in her maternal uncle; Cirrhosis in her father; Diabetes in her father, mother, and sister; Heart attack in her mother; Heart disease in her father and mother; Hyperlipidemia in her father and mother; Hypertension in her father and mother; Liver cancer in her maternal aunt.    FLUID BALANCE AND WEIGHT:                                           Last filed weight: Weight: 76.2 kg (167 lb 15.9 oz)  Weight on Admission: Weight: 81.6 kg (180 lb)  Net IO Since Admission: -3,430 mL [09/04/22 1009]     Intake/Output Summary (Last 24 hours) at 09/04/2022 0700  Last data filed at 09/04/2022 0600  Gross per 24 hour   Intake 950 ml   Output 4500 ml   Net -3550 ml   Weight change: -5.447 kg (-12 lb 0.2 oz)    Last 3 Weights for the past 72 hrs (Last 3 readings):   Weight   09/04/22 0338 76.2 kg (167 lb 15.9 oz)   09/03/22 0347 78.9 kg (173 lb 15.1 oz)   09/02/22 1754 79.9 kg (176 lb 2.4 oz)        PHYSICAL EXAM:                                                                 BP 129/88   Pulse (!) 58   Temp 97.3 F (36.3 C) (Oral)   Resp 18   Ht 1.655 m (5' 5.16")   Wt 76.2 kg (167 lb 15.9 oz)   SpO2 96%   BMI 27.82 kg/m     General Appearance:  Breathing comfortable, no acute distress  Head:  normocephalic  Neck:  No jugular venous distension  Lungs:  mild basilar crackles L>R, no wheezes, rhonchi, good respiratory effort, symmetric chest expansion  Cardiac:  irreg, normal S1, S2, no S3, no S4, no rub, 1/6 HSM  Abdomen:  Soft, non-tender, no rebound or guarding, non-distended, positive bowel sounds  Extremities: No LE edema  Neurologic:  Alert and oriented x3, mood and affect normal      LABS:   Labs reviewed:  Recent Labs   Lab 09/04/22  0336 09/03/22  0351 09/02/22  1322   WBC 9.91* 10.35* 12.67*    Hemoglobin 14.0 13.2 14.3   Hematocrit 41.3 38.3 43.8*   Platelet Count 176 175 203     Recent Labs   Lab 09/04/22  0336 09/03/22  0351 09/02/22  1340   Sodium 141 142 142   Potassium 3.8 3.2* 4.1   Chloride 102 107 108   CO2 29 26 23    BUN 21 18 17    Creatinine 1.1* 0.9 0.9   GFR 54.4* >60.0 >60.0   Glucose 141* 59* 207*   Calcium 8.5 8.2* 8.9     Recent Labs   Lab 09/02/22  1613 09/02/22  1322   hs Troponin 13.7 15.0*                 [1]   Allergies  Allergen Reactions    Shellfish-Derived Products

## 2022-09-04 NOTE — Plan of Care (Addendum)
NURSING SHIFT NOTE     Patient: [name]  Day: 1    Pt is A&Ox4.  Pt able to walk to the bathroom with one assistant using a cane. On 2L  SpO2 95%. Pt able to swallow pills without difficulty.  No significant event overnight. Plan of care discussed with patient; all concerns addressed. Safety bundle maintained. Purposeful hourly rounding completed.       ASSESSMENT     Changes in assessment from patient's baseline this shift:    Neuro: No  CV: No  Pulm: No  Peripheral Vascular: No  HEENT: No  GI: No  BM during shift: No, Last BM: Last BM Date: 09/03/22  GU: No   Integ: No  MS: No    Pain: none  Pain Interventions: none  Medications Utilized: none    Mobility: PMP Activity: Step 6 - Walks in Room of [employer] (ft) (Step 6,7): 25 Feet           Lines     Patient Lines/Drains/Airways Status       Active Lines, Drains and Airways       Name Placement date Placement time Site Days    Peripheral IV 09/02/22 20 G Anterior;Proximal;Right Forearm 09/02/22  --  Forearm  2                         VITAL SIGNS     Vitals:    09/03/22 2345   BP: 115/72   Pulse: 68   Resp:    Temp: 98.4 F (36.9 C)   SpO2: 95%       Temp  Min: 97.9 F (36.6 C)  Max: 99 F (37.2 C)  Pulse  Min: 60  Max: 83  Resp  Min: 15  Max: 19  BP  Min: 108/65  Max: 139/78  SpO2  Min: 93 %  Max: 98 %      Intake/Output Summary (Last 24 hours) at 09/04/2022 0138  Last data filed at 09/03/2022 2111  Gross per 24 hour   Intake 1040 ml   Output 3700 ml   Net -2660 ml            CARE PLAN        Problem: Pain interferes with ability to perform ADL  Goal: Pain at adequate level as identified by patient  09/04/2022 0130 by Willette Cluster, RN  Outcome: Progressing  Flowsheets (Taken 09/03/2022 0804 by Gerrit Heck, RN)  Pain at adequate level as identified by patient:   Identify patient comfort function goal   Assess pain on admission, during daily assessment and/or before any "as needed" intervention(s)   Evaluate if patient comfort function goal is  met  09/04/2022 0040 by Willette Cluster, RN  Outcome: Progressing  09/04/2022 0040 by Willette Cluster, RN  Outcome: Progressing  09/04/2022 0040 by Willette Cluster, RN  Outcome: Progressing     Problem: Side Effects from Pain Analgesia  Goal: Patient will experience minimal side effects of analgesic therapy  09/04/2022 0130 by Willette Cluster, RN  Outcome: Progressing  Flowsheets (Taken 09/03/2022 0319)  Patient will experience minimal side effects of analgesic therapy:   Monitor/assess patient's respiratory status (RR depth, effort, breath sounds)   Assess for changes in cognitive function   Prevent/manage side effects per LIP orders (i.e. nausea, vomiting, pruritus, constipation, urinary retention, etc.)   Evaluate for opioid-induced sedation with appropriate assessment tool (i.e. POSS)  09/04/2022 0040 by Willette Cluster,  RN  Outcome: Progressing  09/04/2022 0040 by Willette Cluster, RN  Outcome: Progressing  09/04/2022 0040 by Willette Cluster, RN  Outcome: Progressing     Problem: Compromised Sensory Perception  Goal: Sensory Perception Interventions  09/04/2022 0130 by Willette Cluster, RN  Outcome: Progressing  Flowsheets (Taken 09/03/2022 2000)  Sensory Perception Interventions: Offload heels, Pad bony prominences, Reposition q 2hrs/turn Clock, Q2 hour skin assessment under devices if present  09/04/2022 0040 by Willette Cluster, RN  Outcome: Progressing  09/04/2022 0040 by Willette Cluster, RN  Outcome: Progressing  09/04/2022 0040 by Willette Cluster, RN  Outcome: Progressing     Problem: Compromised Moisture  Goal: Moisture level Interventions  09/04/2022 0130 by Willette Cluster, RN  Outcome: Progressing  Flowsheets (Taken 09/03/2022 2000)  Moisture level Interventions: Moisture wicking products, Moisture barrier cream  09/04/2022 0040 by Willette Cluster, RN  Outcome: Progressing  09/04/2022 0040 by Willette Cluster, RN  Outcome: Progressing  09/04/2022 0040 by Willette Cluster, RN  Outcome: Progressing     Problem: Compromised Activity/Mobility  Goal: Activity/Mobility  Interventions  09/04/2022 0130 by Willette Cluster, RN  Outcome: Progressing  Flowsheets (Taken 09/03/2022 2000)  Activity/Mobility Interventions: Pad bony prominences, TAP Seated positioning system when OOB, Promote PMP, Reposition q 2 hrs / turn clock, Offload heels  09/04/2022 0040 by Willette Cluster, RN  Outcome: Progressing  09/04/2022 0040 by Willette Cluster, RN  Outcome: Progressing  09/04/2022 0040 by Willette Cluster, RN  Outcome: Progressing     Problem: Moderate/High Fall Risk Score >5  Goal: Patient will remain free of falls  09/04/2022 0130 by Willette Cluster, RN  Outcome: Progressing  Flowsheets (Taken 09/03/2022 2000)  Moderate Risk (6-13):   MOD-Consider activation of bed alarm if appropriate   MOD-Apply bed exit alarm if patient is confused   MOD-Floor mat at bedside (where available) if appropriate   MOD-Consider a move closer to Nurses Station   MOD-Remain with patient during toileting   MOD-Place bedside commode and assistive devices out of sight when not in use   MOD-Re-orient confused patients   MOD-Utilize diversion activities   MOD-Perform dangle, stand, walk (DSW) prior to mobilization   MOD-Request PT/OT consult order for patients with gait/mobility impairment   MOD-Use gait belt when appropriate   MOD-include family in multidisciplinary POC discussions   MOD- Consider video monitoring  09/04/2022 0040 by Willette Cluster, RN  Outcome: Progressing  09/04/2022 0040 by Willette Cluster, RN  Outcome: Progressing  09/04/2022 0040 by Willette Cluster, RN  Outcome: Progressing     Problem: Safety  Goal: Patient will be free from injury during hospitalization  09/04/2022 0130 by Willette Cluster, RN  Outcome: Progressing  Flowsheets (Taken 09/03/2022 0804 by Gerrit Heck, RN)  Patient will be free from injury during hospitalization:   Assess patient's risk for falls and implement fall prevention plan of care per policy   Provide and maintain safe environment  09/04/2022 0040 by Willette Cluster, RN  Outcome: Progressing  09/04/2022 0040 by Willette Cluster, RN  Outcome: Progressing  09/04/2022 0040 by Willette Cluster, RN  Outcome: Progressing  Goal: Patient will be free from infection during hospitalization  09/04/2022 0130 by Willette Cluster, RN  Outcome: Progressing  Flowsheets (Taken 09/03/2022 0319)  Free from Infection during hospitalization:   Assess and monitor for signs and symptoms of infection   Monitor lab/diagnostic results   Monitor all insertion sites (i.e. indwelling lines, tubes, urinary catheters, and drains)   Encourage patient and family to use good hand hygiene technique  09/04/2022 0040 by Willette Cluster, RN  Outcome: Progressing  09/04/2022 0040 by Willette Cluster, RN  Outcome: Progressing  09/04/2022 0040 by Willette Cluster, RN  Outcome: Progressing     Problem: Day of Admission - Heart Failure  Goal: Heart Failure Admission  09/04/2022 0130 by Willette Cluster, RN  Outcome: Progressing  Flowsheets (Taken 09/03/2022 0358)  Heart Failure Admission:   Standing Weight on admission, if unable to stand zero the bed and use the bed scale   Fluid restriction   Oxygen as needed   Strict Intake/Output   Vital signs and telemetry per policy   Assess for swelling/edema and document   Initiate education with patient and caregiver using CHF Warning Zones and Educational Videos ([employer] or [employer])  09/04/2022 0040 by Willette Cluster, RN  Outcome: Progressing  09/04/2022 0040 by Willette Cluster, RN  Outcome: Progressing  09/04/2022 0040 by Willette Cluster, RN  Outcome: Progressing     Problem: Everyday - Heart Failure  Goal: Stable Vital Signs and Fluid Balance  09/04/2022 0130 by Willette Cluster, RN  Outcome: Progressing  Flowsheets (Taken 09/03/2022 0804 by Gerrit Heck, RN)  Stable Vital Signs and Fluid Balance:   Monitor labs and report abnormalities to physician   Monitor, assess vital signs and telemetry per policy  09/04/2022 0040 by Willette Cluster, RN  Outcome: Progressing  09/04/2022 0040 by Willette Cluster, RN  Outcome: Progressing  09/04/2022 0040 by Willette Cluster, RN  Outcome:  Progressing  Goal: Mobility/Activity is Maintained at Optimal Level for Patient  09/04/2022 0130 by Willette Cluster, RN  Outcome: Progressing  Flowsheets (Taken 09/03/2022 0804 by Gerrit Heck, RN)  Mobility/Activity is Maintained at Optimal Level for Patient:   Increase mobility as tolerated/progressive mobility protocol   Perform active/passive ROM  09/04/2022 0040 by Willette Cluster, RN  Outcome: Progressing  09/04/2022 0040 by Willette Cluster, RN  Outcome: Progressing  09/04/2022 0040 by Willette Cluster, RN  Outcome: Progressing  Goal: Nutritional Intake is Adequate  09/04/2022 0130 by Willette Cluster, RN  Outcome: Progressing  Flowsheets (Taken 09/03/2022 0804 by Gerrit Heck, RN)  Nutritional Intake is Adequate: Fluid Restricction if needed  09/04/2022 0040 by Willette Cluster, RN  Outcome: Progressing  09/04/2022 0040 by Willette Cluster, RN  Outcome: Progressing  09/04/2022 0040 by Willette Cluster, RN  Outcome: Progressing  Goal: Teaching-Using CHF Warning Zones and Educational Videos  09/04/2022 0130 by Willette Cluster, RN  Outcome: Progressing  Flowsheets (Taken 09/03/2022 0358)  Teaching-Using CHF Warning Zones and Educational Videos:   Signs & Symptoms of CHF   Daily Standing Weights & record   CHF Warning Zones and when to call for help   Medications   Fluid Restriction if appropriate   Sodium Restriction   Document in the Education Tab in EPIC with Teach-back  09/04/2022 0040 by Willette Cluster, RN  Outcome: Progressing  09/04/2022 0040 by Willette Cluster, RN  Outcome: Progressing  09/04/2022 0040 by Willette Cluster, RN  Outcome: Progressing     Problem: Everyday - Heart Failure  Goal: Nutritional Intake is Adequate  09/04/2022 0040 by Willette Cluster, RN  Outcome: Progressing     Problem: Everyday - Heart Failure  Goal: Nutritional Intake is Adequate  09/04/2022 0040 by Willette Cluster, RN  Outcome: Progressing     Problem: Everyday - Heart Failure  Goal: Nutritional Intake is Adequate  09/04/2022 0040 by Willette Cluster, RN  Outcome: Progressing

## 2022-09-04 NOTE — Plan of Care (Signed)
Problem: Moderate/High Fall Risk Score >5  Goal: Patient will remain free of falls  Outcome: Progressing  Flowsheets (Taken 09/04/2022 2208)  Moderate Risk (6-13):   MOD-Consider activation of bed alarm if appropriate   MOD-Apply bed exit alarm if patient is confused   MOD-Floor mat at bedside (where available) if appropriate   MOD-Consider a move closer to Nurses Station   MOD-Remain with patient during toileting   MOD-Place bedside commode and assistive devices out of sight when not in use   MOD-Re-orient confused patients   MOD-Utilize diversion activities   MOD-Perform dangle, stand, walk (DSW) prior to mobilization   MOD-Request PT/OT consult order for patients with gait/mobility impairment   MOD-Use gait belt when appropriate   MOD-include family in multidisciplinary POC discussions   MOD- Consider video monitoring     Problem: Safety  Goal: Patient will be free from injury during hospitalization  Outcome: Progressing  Flowsheets (Taken 09/04/2022 2208)  Patient will be free from injury during hospitalization:   Assess patient's risk for falls and implement fall prevention plan of care per policy   Provide and maintain safe environment   Use appropriate transfer methods   Ensure appropriate safety devices are available at the bedside   Include patient/ family/ care giver in decisions related to safety   Hourly rounding   Assess for patients risk for elopement and implement Elopement Risk Plan per policy   Provide alternative method of communication if needed (communication boards, writing)  Goal: Patient will be free from infection during hospitalization  Outcome: Progressing     Problem: Everyday - Heart Failure  Goal: Stable Vital Signs and Fluid Balance  Outcome: Progressing  Flowsheets (Taken 09/04/2022 2208)  Stable Vital Signs and Fluid Balance:   Monitor, assess vital signs and telemetry per policy   Monitor labs and report abnormalities to physician   Strict Intake/Output   Fluid Restriction   Assess  for swelling/edema   Wean oxygen as needed if appropriate  Goal: Mobility/Activity is Maintained at Optimal Level for Patient  Outcome: Progressing  Flowsheets (Taken 09/04/2022 2208)  Mobility/Activity is Maintained at Optimal Level for Patient:   Increase mobility as tolerated/progressive mobility protocol   Maintain SCD's as Ordered   Perform active/passive ROM   Reposition patient every 2 hours and as needed unless able to reposition self   Assess for changes in respiratory status, level of consciousness and/or development of fatigue   Consult with Physical Therapy and/or Occupational Therapy  Goal: Nutritional Intake is Adequate  Outcome: Progressing  Goal: Teaching-Using CHF Warning Zones and Educational Videos  Outcome: Progressing

## 2022-09-04 NOTE — Plan of Care (Deleted)
NURSING SHIFT NOTE     Patient: [name]  Day: 1      Pt is A&Ox4. Pt able to walk to the bathroom with one assistant using a cane. On 2L SpO2 95%. Pt able to swallow pills without difficulty.  No significant event overnight. Plan of care discussed with patient; all concerns addressed. Safety bundle maintained. Purposeful hourly rounding completed.     ASSESSMENT     Changes in assessment from patient's baseline this shift:    Neuro: No  CV: No  Pulm: No  Peripheral Vascular: No  HEENT: No  GI: No  BM during shift: No, Last BM: Last BM Date: 09/03/22  GU: No   Integ: Yes  MS: No    Pain:   Pain Interventions:   Medications Utilized:       Mobility: PMP Activity: Step 6 - Walks in Room of Distance Walked (ft) (Step 6,7): 25 Feet           Lines     Patient Lines/Drains/Airways Status       Active Lines, Drains and Airways       Name Placement date Placement time Site Days    Peripheral IV 09/02/22 20 G Anterior;Proximal;Right Forearm 09/02/22  --  Forearm  2                         VITAL SIGNS     Vitals:    09/03/22 2345   BP: 115/72   Pulse: 68   Resp:    Temp: 98.4 F (36.9 C)   SpO2: 95%       Temp  Min: 97.9 F (36.6 C)  Max: 99 F (37.2 C)  Pulse  Min: 60  Max: 83  Resp  Min: 15  Max: 19  BP  Min: 108/65  Max: 139/78  SpO2  Min: 93 %  Max: 98 %      Intake/Output Summary (Last 24 hours) at 09/04/2022 0042  Last data filed at 09/03/2022 2028  Gross per 24 hour   Intake 840 ml   Output 3700 ml   Net -2860 ml            CARE PLAN        Problem: Pain interferes with ability to perform ADL  Goal: Pain at adequate level as identified by patient  09/04/2022 0040 by Willette Cluster, RN  Outcome: Progressing  09/04/2022 0040 by Willette Cluster, RN  Outcome: Progressing  09/04/2022 0040 by Willette Cluster, RN  Outcome: Progressing     Problem: Side Effects from Pain Analgesia  Goal: Patient will experience minimal side effects of analgesic therapy  09/04/2022 0040 by Willette Cluster, RN  Outcome: Progressing  09/04/2022 0040  by Willette Cluster, RN  Outcome: Progressing  09/04/2022 0040 by Willette Cluster, RN  Outcome: Progressing     Problem: Compromised Sensory Perception  Goal: Sensory Perception Interventions  09/04/2022 0040 by Willette Cluster, RN  Outcome: Progressing  09/04/2022 0040 by Willette Cluster, RN  Outcome: Progressing  09/04/2022 0040 by Willette Cluster, RN  Outcome: Progressing     Problem: Compromised Moisture  Goal: Moisture level Interventions  09/04/2022 0040 by Willette Cluster, RN  Outcome: Progressing  09/04/2022 0040 by Willette Cluster, RN  Outcome: Progressing  09/04/2022 0040 by Willette Cluster, RN  Outcome: Progressing     Problem: Compromised Activity/Mobility  Goal: Activity/Mobility Interventions  09/04/2022 0040 by Willette Cluster, RN  Outcome: Progressing  09/04/2022  0040 by Willette Cluster, RN  Outcome: Progressing  09/04/2022 0040 by Willette Cluster, RN  Outcome: Progressing     Problem: Moderate/High Fall Risk Score >5  Goal: Patient will remain free of falls  09/04/2022 0040 by Willette Cluster, RN  Outcome: Progressing  09/04/2022 0040 by Willette Cluster, RN  Outcome: Progressing  09/04/2022 0040 by Willette Cluster, RN  Outcome: Progressing     Problem: Safety  Goal: Patient will be free from injury during hospitalization  09/04/2022 0040 by Willette Cluster, RN  Outcome: Progressing  09/04/2022 0040 by Willette Cluster, RN  Outcome: Progressing  09/04/2022 0040 by Willette Cluster, RN  Outcome: Progressing  Goal: Patient will be free from infection during hospitalization  09/04/2022 0040 by Willette Cluster, RN  Outcome: Progressing  09/04/2022 0040 by Willette Cluster, RN  Outcome: Progressing  09/04/2022 0040 by Willette Cluster, RN  Outcome: Progressing     Problem: Day of Admission - Heart Failure  Goal: Heart Failure Admission  09/04/2022 0040 by Willette Cluster, RN  Outcome: Progressing  09/04/2022 0040 by Willette Cluster, RN  Outcome: Progressing  09/04/2022 0040 by Willette Cluster, RN  Outcome: Progressing     Problem: Everyday - Heart Failure  Goal: Stable Vital Signs and Fluid  Balance  09/04/2022 0040 by Willette Cluster, RN  Outcome: Progressing  09/04/2022 0040 by Willette Cluster, RN  Outcome: Progressing  09/04/2022 0040 by Willette Cluster, RN  Outcome: Progressing  Goal: Mobility/Activity is Maintained at Optimal Level for Patient  09/04/2022 0040 by Willette Cluster, RN  Outcome: Progressing  09/04/2022 0040 by Willette Cluster, RN  Outcome: Progressing  09/04/2022 0040 by Willette Cluster, RN  Outcome: Progressing  Goal: Nutritional Intake is Adequate  09/04/2022 0040 by Willette Cluster, RN  Outcome: Progressing  09/04/2022 0040 by Willette Cluster, RN  Outcome: Progressing  09/04/2022 0040 by Willette Cluster, RN  Outcome: Progressing  Goal: Teaching-Using CHF Warning Zones and Educational Videos  09/04/2022 0040 by Willette Cluster, RN  Outcome: Progressing  09/04/2022 0040 by Willette Cluster, RN  Outcome: Progressing  09/04/2022 0040 by Willette Cluster, RN  Outcome: Progressing

## 2022-09-04 NOTE — Plan of Care (Signed)
Problem: Compromised Sensory Perception  Goal: Sensory Perception Interventions  Outcome: Progressing  Flowsheets (Taken 09/03/2022 2000 by Willette Cluster, RN)  Sensory Perception Interventions: Offload heels, Pad bony prominences, Reposition q 2hrs/turn Clock, Q2 hour skin assessment under devices if present     Problem: Compromised Moisture  Goal: Moisture level Interventions  Outcome: Progressing  Flowsheets (Taken 09/03/2022 2000 by Willette Cluster, RN)  Moisture level Interventions: Moisture wicking products, Moisture barrier cream     Problem: Safety  Goal: Patient will be free from injury during hospitalization  Outcome: Progressing  Flowsheets (Taken 09/03/2022 0804 by Gerrit Heck, RN)  Patient will be free from injury during hospitalization:   Assess patient's risk for falls and implement fall prevention plan of care per policy   Provide and maintain safe environment     Problem: Everyday - Heart Failure  Goal: Stable Vital Signs and Fluid Balance  Outcome: Progressing  Flowsheets (Taken 09/04/2022 0133 by Willette Cluster, RN)  Stable Vital Signs and Fluid Balance:   Daily Standing Weights in the morning using the same scale, after using the bathroom and before breadfast.  If unable to stand, zero the bed and use the bed scale   Monitor, assess vital signs and telemetry per policy   Monitor labs and report abnormalities to physician   Strict Intake/Output   Assess for swelling/edema   Wean oxygen as needed if appropriate   Fluid Restriction

## 2022-09-04 NOTE — Progress Notes (Signed)
[facility]  PROGRESS NOTE      Patient: [name]  Date: 09/04/2022   LOS: 1 Days  Admission Date: 09/02/2022   MRN: [id number]  Attending: Cruz Condon, MD  When on service as the attending, please contact me on Epic Secure Chat from 7 AM - 7 PM for non-urgent issues. For urgent matters use XTend page from 7 AM - 7 PM.       ASSESSMENT/PLAN     [name] is a 67 y.o. female with PMHx significant for hypertension, hyperlipidemia, diabetes mellitus type 2, CAD, paroxysmal A-fib, obesity, glaucoma, HFpEF who presented for worsening shortness of breath and admitted for fluid overload      #Acute on chronic HFpEF  #Fluid overload  -Patient presented with worsening shortness of breath  -proBNP on presentation was 3018  -CXR showed worsening pulmonary edema suspicious for CHF.  No pneumothorax  -Echocardiography done on 08/21/2021 showed LVEF of 60-65% with diastolic dysfunction  -Repeat echo today LVEF of 60-65% with indeterminate diastolic function, moderate tricuspid regurgitation  -Started on Bumex on admission  -[place] cardiology consulted, patient known to the service  - Lasix discontinued by cardiology due to slight bump in creatinine   -Fluid restriction to 1.5 L/day; salt restriction to less than 2 g daily  Patient tolerated room air at rest, desaturated on ambulation at the hallway  -Will continue on Bumex, possible discharge tomorrow  -Heart healthy diet    #Hypertension  - Hemodynamically stable  - Continue to monitor BP  - Continue on lisinopril 40 mg p.o. daily, carvedilol 12.5 mg p.o. twice daily  -Follow-up cardiology recommendations    #Hyperlipidemia  - Continue atorvastatin 40 mg p.o. daily    #History of CAD  #Paroxysmal A-fib, well-controlled  -Continue on Coreg as above  -Cardiology consulted    #Diabetes mellitus type 2  -Hemoglobin A1c recently checked 11 days ago was 7.4  -Continue on Lantus 20 units subcu  - Continue Accu-Chek and manage with SSI medium    #Hypokalemia  -  Continue to monitor and replete per protocol    #Glaucoma  -Continue home timolol I drops      Nutrition: Heart healthy          Patient has BMI=Body mass index is 27.82 kg/m.  Diagnosis: Overweight based on BMI criteria           Recent Labs     09/04/22  0336 09/03/22  0351 09/02/22  1340   Potassium 3.8 3.2* 4.1     Diagnosis: Hypokalemia       Code Status: Full Code    Dispo: Discharge home pending cardiology evaluation and fluid overload improvement    Family Contact: none    DVT Prophylaxis:   Current Facility-Administered Medications (Includes Only Anticoagulants, Misc. Hematological)   Medication Dose Route Last Admin    dabigatran (PRADAXA) capsule 150 mg  150 mg Oral            CHART  REVIEW & DISCUSSION     The following chart items were reviewed as of 4:37 PM on 09/04/22:  [x]  Lab Results [x]  Imaging Results   [x]  Problem List  [x]  Current Orders [x]  Current Medications  []  Allergies  []  Code Status []  Previous Notes   []  SDoH    The management and plan of care for this patient was discussed with the following specialty consultants:  [x]  Cardiology  []  Gastroenterology                 []   Infectious Disease  []  Pulmonology []  Neurology                []  Nephrology  []  Neurosurgery []  Orthopedic Surgery  []  Heme/Onc  []  General Surgery []  Psychiatry                                   []  Palliative    SUBJECTIVE     [name] was seen and examined at bedside today.  Patient lying on 2 L overnight, tolerated room air at rest.  Required oxygen on ambulation  Has been receiving Bumex.  Discontinued Lasix    MEDICATIONS     Current Facility-Administered Medications   Medication Dose Route Frequency    atorvastatin  40 mg Oral Daily    [START ON 09/05/2022] bumetanide  1 mg Oral Q Mon/Wed/Fri (AM)    bumetanide  2 mg Oral Q Sun/Tue/Thu/Sat (AM)    carvedilol  6.25 mg Oral Q12H [facility]    dabigatran  150 mg Oral Q12H [facility]    empagliflozin  25 mg Oral Daily    insulin glargine  15 Units Subcutaneous QAM     insulin lispro  1-8 Units Subcutaneous TID AC    lisinopril  40 mg Oral Daily    timolol  1 drop Both Eyes Daily       PHYSICAL EXAM     Vitals:    09/04/22 1523   BP: 133/75   Pulse: 63   Resp: 17   Temp: 98.4 F (36.9 C)   SpO2: 95%       Temperature: Temp  Min: 97.3 F (36.3 C)  Max: 98.4 F (36.9 C)  Pulse: Pulse  Min: 58  Max: 79  Respiratory: Resp  Min: 16  Max: 18  Non-Invasive BP: BP  Min: 115/72  Max: 139/78  Pulse Oximetry SpO2  Min: 93 %  Max: 98 %    Intake and Output Summary (Last 24 hours) at Date Time    Intake/Output Summary (Last 24 hours) at 09/04/2022 1637  Last data filed at 09/04/2022 1205  Gross per 24 hour   Intake 1130 ml   Output 2600 ml   Net -1470 ml       GEN APPEARANCE: Lying in bed on 4 L of oxygen via NC;  A&OX3  HEENT: PERLA; EOMI; Conjunctiva Clear  NECK: Supple; No bruits  CVS: RRR, S1, S2; No M/G/R  LUNGS: Basal fine crackles appreciated on auscultation  ABD: Soft; No TTP; + Normoactive BS  EXT: No edema; Pulses 2+ and intact  NEURO: CN 2-12 intact; No Focal neurological deficits  MENTAL STATUS:     LABS     Recent Labs   Lab 09/04/22  0336 09/03/22  0351 09/02/22  1322   WBC 9.91* 10.35* 12.67*   RBC 4.61 4.27 4.83   Hemoglobin 14.0 13.2 14.3   Hematocrit 41.3 38.3 43.8*   MCV 89.6 89.7 90.7   Platelet Count 176 175 203       Recent Labs   Lab 09/04/22  0336 09/03/22  0351 09/02/22  1340   Sodium 141 142 142   Potassium 3.8 3.2* 4.1   Chloride 102 107 108   CO2 29 26 23    BUN 21 18 17    Creatinine 1.1* 0.9 0.9   Glucose 141* 59* 207*   Calcium 8.5 8.2* 8.9       Recent  Labs   Lab 09/03/22  0351 09/02/22  1340   ALT 16 22   AST (SGOT) 15 24   Bilirubin, Total 1.1 1.0   Albumin 3.1* 3.6   Alkaline Phosphatase 100 135*       Recent Labs   Lab 09/02/22  1613 09/02/22  1322   hs Troponin 13.7 15.0*             Microbiology Results (last 15 days)       Procedure Component Value Units Date/Time    COVID-19 and Influenza ([place]) (symptomatic) [[id number]]  (Normal) Collected: 09/02/22 1328     Order Status: Completed Specimen: Swab from Anterior Nares Updated: 09/02/22 1408     SARS-CoV-2 (COVID-19) RNA Not Detected     Influenza A RNA Not Detected     Influenza B RNA Not Detected    Narrative:      A result of "Detected" indicates POSITIVE for the presence of viral RNA  A result of "Not Detected" indicates NEGATIVE for the presence of viral RNA    Test performed using the Roche cobas [place] SARS-CoV-2 & Influenza A/B assay. This is a multiplex real-time RT-PCR assay for the detection of SARS-CoV-2, influenza A, and influenza B virus RNA. Viral nucleic acids may persist in vivo, independent of viability. Detection of viral nucleic acid does not imply the presence of infectious virus, or that virus nucleic acid is the cause of clinical symptoms. Negative results do not preclude SARS-CoV-2, influenza A, and/or influenza B infection and should not be used as the sole basis for diagnosis, treatment or other patient management decisions. Invalid results may be due to inhibiting substances in the specimen and recollection should occur.              RADIOLOGY     Chest AP Portable    Result Date: 09/02/2022  1. Worsened pulmonary edema suspicious for CHF. 2. No pneumothorax. Nelta Numbers, MD 09/02/2022 1:42 PM   Echo Results       None          No results found for this or any previous visit.    Signed,  Cruz Condon, MD  4:37 PM 09/04/2022

## 2022-09-04 NOTE — UM Notes (Signed)
PATIENT NAME: [name],[name]   DOB: 01/25/1956     7.28.2024 Continued Stay Review: inpatient status as of 7.27.24    Diagnosis: Acute On Chronic Congestive Heart Failure, Unspecified Heart Failure Type     Lungs:  mild basilar crackles L>R, no wheezes, rhonchi, good respiratory effort, symmetric chest expansion       Vital Signs  09/04/22 0855 -- -- -- 129/88 -- --   09/04/22 0854 -- -- -- 129/88 -- --   09/04/22 07:32:17 97.3 F (36.3 C) 58 Abnormal  18 126/87 96 % --   09/04/22 0338 97.9 F (36.6 C) 69 18 128/72 98 % 2 L/min       Medications  Scheduled Meds:  Current Facility-Administered Medications   Medication Dose Route Frequency    atorvastatin  40 mg Oral Daily    [START ON 09/05/2022] bumetanide  1 mg Oral Q Mon/Wed/Fri (AM)    bumetanide  2 mg Oral Q Sun/Tue/Thu/Sat (AM)    carvedilol  6.25 mg Oral Q12H SCH    dabigatran  150 mg Oral Q12H SCH    empagliflozin  25 mg Oral Daily    insulin glargine  15 Units Subcutaneous QAM    insulin lispro  1-8 Units Subcutaneous TID AC    lisinopril  40 mg Oral Daily    timolol  1 drop Both Eyes Daily         Abnormal Labs  WBC 9.91  Glucose 141  Creat 1.1  EGFR 54.4    Imaging Yesterday's ECHO  Left ventricular chamber dimension, wall thickness and function are  normal. LVEF is 60-65%. No regional wall motion abnormalities. Diastolic  function is indeterminate.    * Right ventricle is normal in size. There is mildly reduced systolic  function.    * Right atrium is mildly dilated.    * The tricuspid valve is normal in structure with no significant stenosis.  There is moderate, central tricuspid regurgitation. RVSP is 35-31mmHg.    * No significant pericardial effusion.    Plan of Care  HFpEF. Acute on chronic. She has been getting both bumex 2mg  IV and PO and lasix 40mg  IV. D/c IV lasix. She is on bumex 1mg  BID 4 days a week and bumex 1mg  daily 3 days a week. She is also on Jardiance which [name] restarted today.   She has not been weighing herself at home. Daughter  thinks her normal weight is ~178lbs. It is 167 lbs this morning. Instructed patient on daily weights.   Cont outpatient diuretic regimen upon discharge and follow daily standing weights.   Afib. In AF with controlled to slow ventricular response. Recommend decreasing carvedilol to 6.25mg  BID.  On Pradaxa. Has not received Pradaxa since her admission 2 days ago ago. Restarted today.  Tricuspid regurg. Chronic mild-moderate.   CAD. Non-obstructive disease. Cont medical management.   HTN. BPs controlled. Monitor with medication changes.  CKD. Suspect baseline Cr is ~1.3. monitor with diuresis.     Primary Coverage:  Payor: MEDICARE [employer] / Plan: [employer] MEDICARE PPO / Product Type: MANAGED MEDICARE /     UTILIZATION REVIEW CONTACT: Elfredia Nevins, RN  Utilization Review   [facility]  [phone]  [phone]  Email: [email or web address]  Tax ID:  [id number]         NOTES TO REVIEWER:    This clinical review is based on/compiled from documentation provided by the treatment team within the patient's medical record.

## 2022-09-05 DIAGNOSIS — I4819 Other persistent atrial fibrillation: Secondary | ICD-10-CM

## 2022-09-05 DIAGNOSIS — I5030 Unspecified diastolic (congestive) heart failure: Secondary | ICD-10-CM

## 2022-09-05 LAB — CBC
Absolute nRBC: 0 10*3/uL (ref <=0.00)
Hematocrit: 43.8 % — ABNORMAL HIGH (ref 34.7–43.7)
Hemoglobin: 15 g/dL — ABNORMAL HIGH (ref 11.4–14.8)
MCH: 30.7 pg (ref 25.1–33.5)
MCHC: 34.2 g/dL (ref 31.5–35.8)
MCV: 89.8 fL (ref 78.0–96.0)
MPV: 11.9 fL (ref 8.9–12.5)
Platelet Count: 193 10*3/uL (ref 142–346)
RBC: 4.88 10*6/uL (ref 3.90–5.10)
RDW: 13 % (ref 11–15)
WBC: 8.89 10*3/uL (ref 3.10–9.50)
nRBC %: 0 /100 WBC (ref <=0.0)

## 2022-09-05 LAB — WHOLE BLOOD GLUCOSE POCT
Whole Blood Glucose POCT: 217 mg/dL — ABNORMAL HIGH (ref 70–100)
Whole Blood Glucose POCT: 168 mg/dL — ABNORMAL HIGH (ref 70–100)
Whole Blood Glucose POCT: 242 mg/dL — ABNORMAL HIGH (ref 70–100)
Whole Blood Glucose POCT: 187 mg/dL — ABNORMAL HIGH (ref 70–100)

## 2022-09-05 LAB — BASIC METABOLIC PANEL
Anion Gap: 11 (ref 5.0–15.0)
BUN: 28 mg/dL — ABNORMAL HIGH (ref 7–21)
CO2: 26 mEq/L (ref 17–29)
Calcium: 8.8 mg/dL (ref 8.5–10.5)
Chloride: 102 mEq/L (ref 99–111)
Creatinine: 1.1 mg/dL — ABNORMAL HIGH (ref 0.4–1.0)
GFR: 54.4 mL/min/{1.73_m2} — ABNORMAL LOW (ref >=60.0)
Glucose: 175 mg/dL — ABNORMAL HIGH (ref 70–100)
Potassium: 3.7 mEq/L (ref 3.5–5.3)
Sodium: 139 mEq/L (ref 135–145)

## 2022-09-05 LAB — NT-PROBNP: NT-ProBNP: 1290 pg/mL — ABNORMAL HIGH (ref <125)

## 2022-09-05 MED ORDER — CARVEDILOL 6.25 MG PO TABS
6.2500 mg | ORAL_TABLET | Freq: Two times a day (BID) | ORAL | 1 refills | Status: AC
Start: 2022-09-05 — End: 2022-11-04

## 2022-09-05 NOTE — Progress Notes (Signed)
Pt d/c home with no current needs. Daughter to transport.    09/05/22 1331   Discharge Disposition   Physical Discharge Disposition Home   Mode of Transportation Car   Patient/Family/POA notified of transfer plan Yes   Patient agreeable to discharge plan/expected d/c date? Yes   Family/POA agreeable to discharge plan/expected d/c date? Yes   Bedside nurse notified of transport plan? Yes   CM Interventions   Multidisciplinary rounds/family meeting before d/c? Yes   Medicare Checklist   Is this a Medicare patient? Yes     Sherryll Burger BSN, RN CM  RN Case Manager I  [facility]  [phone]

## 2022-09-05 NOTE — Progress Notes (Signed)
Initial Case Management Assessment and Discharge Planning [facility]   Patient Name: [name], [name]   Date of Birth Mar 10, 1955   Attending Physician: Cruz Condon, MD   Primary Care Physician: Melvenia Beam, MD   Length of Stay 2   Reason for Consult / Chief Complaint Initial assessment        Situation   Admission DX:   1. Acute on chronic congestive heart failure, unspecified heart failure type    2. Fluid overload    3. Acute pulmonary edema        A/O Status: X 3    LACE Score: 11    Patient admitted from: ER  Admission Status: inpatient    Health Care Agent: Self         Background     Advanced directive:   Not Received    has NO advance directive     Code Status:   Full Code     Residence: Apartment has stairs (6)    PCP: Melvenia Beam, MD  Patient Contact:   [phone] (home)     [phone] (mobile)     Emergency contact:   Extended Emergency Contact Information  Primary Emergency Contact: [facility]  Address: [address]           [address]           [place], [place] [address] [place]  Home Phone: [phone]  Mobile Phone: [phone]  Relation: Daughter  Secondary Emergency Contact: Annett Gula  Address: [address]           [place], [place] [address] [place]  Mobile Phone: [phone]  Relation: Other      ADL/IADL's: Independent  Previous Level of function: 6 Modified Independent     DME: Single point cane  Rollator (4 wheeled walker)    Pharmacy:     [facility] [id number] - [place], [place] - [address]  [address]  [place] [address]  Phone: [phone] Fax: [phone]      Prescription Coverage: Yes    Home Health: The patient is not currently receiving home health services.    Previous SNF/AR: none    UAI on file?: No  Transport for discharge? Mode of transportation: Sales executive - Family/Friend to drive patient  Agreeable to Home with family post-discharge:  Yes     Assessment   CM spoke to pt  at bedside; face sheet verified. Pt resides with daughter. Per pt, modified independent at baseline. No current needs. Daughter to transport upon d/c.   BARRIERS TO DISCHARGE: medical condition     Recommendation   D/C Plan A: Home with family    D/C Plan B: Home with home health         Sherryll Burger BSN, RN CM  RN Case Manager I  [facility]  [phone]

## 2022-09-05 NOTE — Discharge Summary (Signed)
[facility]      Patient: [name]  Admission Date: 09/02/2022   DOB: [birth year]  Discharge Date: 09/05/2022    MRN: [id number]  Discharge Attending:Geira B Denbel, MD     Referring Physician: Melvenia Beam, MD  PCP: Melvenia Beam, MD       DISCHARGE SUMMARY     Discharge Information   Admission Diagnosis:   #Acute on chronic HFpEF  #Fluid overload  #Hypertension  #Hyperlipidemia  #History of CAD  #Paroxysmal A-fib, well-controlled  #Diabetes mellitus type 2  #Hypokalemia  #Glaucoma    Discharge Diagnosis:   Active Hospital Problems    Diagnosis    Acute on chronic congestive heart failure, unspecified heart failure type    Fluid overload   #Acute on chronic HFpEF  #Fluid overload  #Hypertension  #Hyperlipidemia  #History of CAD  #Paroxysmal A-fib, well-controlled  #Diabetes mellitus type 2  #Hypokalemia  #Glaucoma           Admission Condition: Acutely sick  Discharge Condition: Stable  Consultants: Cardiology  Functional Status: Active  Discharged to: [facility]   Presentation History   Per admission H&P:    [name] is a 67 y.o. female with PMHx significant for hypertension, hyperlipidemia, diabetes mellitus type 2, CAD, paroxysmal A-fib, obesity, glaucoma, HFpEF who presented for worsening shortness of breath and admitted for fluid overload     Hospital Course (2 Days)     #Acute on chronic HFpEF  #Fluid overload  -Patient presented with worsening shortness of breath  -proBNP on presentation was 3018  -CXR showed worsening pulmonary edema suspicious for CHF.  No pneumothorax  -Echocardiography done on 08/21/2021 showed LVEF of 60-65% with diastolic dysfunction  -Repeat echo 09/04/2022 showed LVEF of 60-65% with indeterminate diastolic function, moderate tricuspid regurgitation  -Started on Bumex on admission  -Received additional IV Lasix  -[place] cardiology evaluated and followed the patient while in the hospital  - Lasix discontinued by cardiology due to slight bump in  creatinine   -Fluid restriction to 1.5 L/day; salt restriction to less than 2 g daily  -Patient tolerated room air on discharge  -Discharged on Bumex home dose,  -Advised on compliance with medications, fluid and salt restrictions  -Patient will have cardiology follow-up with  Dr. Fransisco Beau on Wednesday     #Hypertension  - Hemodynamically stable on discharge  - Continued on lisinopril 40 mg p.o. daily, carvedilol 12.5 mg p.o. twice daily      Patient's other conditions including hyperlipidemia, history of CAD, paroxysmal A-fib, diabetes mellitus type 2, hypokalemia, glaucoma has been closely monitored and managed with no significant event until discharge          Progress Note/Physical Exam at Discharge     Subjective:Patient is stable for discharge.    Vitals:    09/05/22 0957 09/05/22 0958 09/05/22 1132 09/05/22 1614   BP: 129/80 129/80 143/81 120/78   Pulse:  65 64 72   Resp:   19 20   Temp:   97.7 F (36.5 C) 97.3 F (36.3 C)   TempSrc:   Oral Oral   SpO2:   96% 97%   Weight:       Height:           General: NAD, AAOx3  HEENT: Sclera anicteric, no conjunctival injection, OP: Clear, MMM  Neck: Supple, FROM, no LAD  Cardiovascular: RRR, no m/r/g  Lungs: CTAB, no w/r/r  Abdomen: Soft, +  BS, NT/ND, no masses, no g/r  Extremities: No C/C/E  Skin: No rashes or lesions noted  Neuro: No Focal neurological deficits       Diagnostics     Labs/Studies Pending at Discharge: No    Last Labs   Recent Labs   Lab 09/05/22  0439 09/04/22  0336 09/03/22  0351   WBC 8.89 9.91* 10.35*   RBC 4.88 4.61 4.27   Hemoglobin 15.0* 14.0 13.2   Hematocrit 43.8* 41.3 38.3   MCV 89.8 89.6 89.7   Platelet Count 193 176 175       Recent Labs   Lab 09/05/22  0439 09/04/22  0336 09/03/22  0351 09/02/22  1340   Sodium 139 141 142 142   Potassium 3.7 3.8 3.2* 4.1   Chloride 102 102 107 108   CO2 26 29 26 23    BUN 28* 21 18 17    Creatinine 1.1* 1.1* 0.9 0.9   Glucose 175* 141* 59* 207*   Calcium 8.8 8.5 8.2* 8.9       Microbiology Results (last 15 days)        Procedure Component Value Units Date/Time    COVID-19 and Influenza ([place]) (symptomatic) [[id number]]  (Normal) Collected: 09/02/22 1328    Order Status: Completed Specimen: Swab from Anterior Nares Updated: 09/02/22 1408     SARS-CoV-2 (COVID-19) RNA Not Detected     Influenza A RNA Not Detected     Influenza B RNA Not Detected    Narrative:      A result of "Detected" indicates POSITIVE for the presence of viral RNA  A result of "Not Detected" indicates NEGATIVE for the presence of viral RNA    Test performed using the Roche cobas [place] SARS-CoV-2 & Influenza A/B assay. This is a multiplex real-time RT-PCR assay for the detection of SARS-CoV-2, influenza A, and influenza B virus RNA. Viral nucleic acids may persist in vivo, independent of viability. Detection of viral nucleic acid does not imply the presence of infectious virus, or that virus nucleic acid is the cause of clinical symptoms. Negative results do not preclude SARS-CoV-2, influenza A, and/or influenza B infection and should not be used as the sole basis for diagnosis, treatment or other patient management decisions. Invalid results may be due to inhibiting substances in the specimen and recollection should occur.              Patient Instructions   Discharge Diet: Heart healthy  Discharge Activity: Active    Follow Up Appointment:   Follow-up Information       Gami, Berline Chough, MD .    Specialty: Internal Medicine  Contact information:  [address] [place]  [address]  [place] [address]  [phone]                              Discharge Medications:     Medication List        CHANGE how you take these medications      bumetanide 2 MG tablet  Commonly known as: BUMEX  What changed: Another medication with the same name was removed. Continue taking this medication, and follow the directions you see here.     carvedilol 6.25 MG tablet  Commonly known as: COREG  Take 1 tablet (6.25 mg) by mouth every 12 (twelve) hours  What changed:   medication  strength  how much to take  when to take this  CONTINUE taking these medications      Accu-Chek Aviva Plus w/Device Kit  Check blood glucose level twice daily. ICD 10 Code E11.9, Z79.4     atorvastatin 40 MG tablet  Commonly known as: LIPITOR  Take 1 tablet (40 mg) by mouth every evening     dabigatran 150 MG Caps  Commonly known as: PRADAXA     glucose blood test strip  Commonly known as: Accu-Chek Aviva  Check blood glucose level twice daily. ICD 10 Code E11.9, Z79.4     Insulin Syringe-Needle U-100 31G X 5/16" 1 ML Misc  Commonly known as: BD INSULIN SYRINGE ULTRAFINE  ADMINSTER INSULIN UNDER THE SKIN 2 TIMES A DAY     Jardiance 25 MG tablet  Generic drug: empagliflozin     lisinopril 40 MG tablet  Commonly known as: ZESTRIL  Take 1 tablet (40 mg) by mouth daily     NovoLIN N 100 UNIT/ML injection  Generic drug: insulin NPH  INJECT 25 UNITS SUBCUTANEOUSLY WITH BREAKFST AND 40  UNITS WITH DINNERI     timolol 0.5 % ophthalmic solution  Commonly known as: TIMOPTIC               Where to Get Your Medications        These medications were sent to [facility] [id number] [address] [address],  - [address]  [address], [place] [address]      Phone: [phone]   carvedilol 6.25 MG tablet          Time spent examining patient, discussing with patient/family regarding hospital course, chart review, reconciling medications and discharge planning: 50 minutes.    Signed,  Cruz Condon, MD    6:24 PM 09/05/2022

## 2022-09-05 NOTE — Discharge Instr - AVS First Page (Addendum)
[facility] DISCHARGE INSTRUCTIONS     Date of Admission: 09/02/2022    Date of Discharge: 09/05/2022    Discharge Physician: Cruz Condon, MD    Thank you for choosing the [facility]  for your healthcare needs. It was a privilege caring for you. I am genuinely concerned about your health and comfort.      Below, please find detailed instructions regarding your medical care.     You were admitted for Fluid overload. It is important that you make your follow up appointments listed below.  Bring these discharge papers to your follow-up visit with your medical provider.   I cannot stress the importance of follow up enough.      Consultants: Cardiology    Pending Studies: None    Discharge Diet: Heart healthy diet    Surgery/Procedure: None    Further Instructions: You are strongly advised to comply with home medications.  As you will have follow-up appointment with Dr. Fransisco Beau on Wednesday, future adjustment of your medications to include new, adjusted dose will be during that appointment.  You are advised to be strict on taking your daily weight, medical record, avoid excessive fluid intake more than 1.5 L, salt restriction to less than 2 g to prevent from fluid overload.    Follow up Appointments:          Please take your medications as prescribed. As we discussed, please review the attached handouts for more information about important side effects of your medication.    If you have any fever, chest pain, palpitations, shortness of breath or difficulty breathing, worsening nausea and vomiting, or lower leg pain please seek medical attention immediately.     Finally, below please find the results of imaging studies that you had in the hospital. Please review this with your primary care doctor.    You will likely be receiving a survey about the care you received in our hospital. It would mean a lot to me and all of the care team if you could fill that out and return the form. We're always looking to improve  and your feedback will help [place] do that.     I wish you good health. Please do not hesitate to contact me if I can be of assistance. I aim to provide excellent care.     Respectfully Yours,    Cruz Condon, MD        If you are unable to obtain an appointment, unable to obtain newly prescribed medications, or are unclear about any of your discharge instructions please contact your discharging physician at [phone] (M-F, 8am-3pm) or weekends and after hours via the hospital operator [phone], hospital case manager, or your primary care physician.    Imaging Studies while hospitalized:  Chest AP Portable    Result Date: 09/02/2022  1. Worsened pulmonary edema suspicious for CHF. 2. No pneumothorax. Nelta Numbers, MD 09/02/2022 1:42 PM   Echo Results       Procedure Component Value Units Date/Time    Echo Adult TTE Complete [[id number]] Collected: 09/03/22 0828     Updated: 09/03/22 1035     MV Area (PHT) 3.993     RV Systolic Pressure 44.482     LA Volume Index (BP A-L) 24     MV E/e' (Average) 12.79     Atrial Septum Findings No evidence of interatrial shunt by color Doppler.     Aortic Valve Findings The aortic valve is tricuspid  with no significant regurgitation or stenosis.     Pulmonary Valve Findings The pulmonic valve is normal in structure with no significant regurgitation or stenosis.     Mitral Valve Findings The mitral valve is normal in structure with no significant regurgitation or stenosis.     Tricuspid Valve Findings The tricuspid valve is normal in structure with no significant stenosis. There is moderate, central tricuspid regurgitation. RVSP is 35-75mmHg.     IVS Diastolic Thickness (2D) 0.968     LVID diastole (2D) 3.69     LVID systole (2D) 2.46     BP Mod LV Ejection Fraction 66     LA Dimension (2D) 3.6     AV Mean Gradient 3     AV Mean Gradient 4     AV Area (Cont Eq VTI) 3.428     AV Area (Cont Eq VTI) 3.62     Ao Root Diameter (2D) 3.4     Prox Ascending Aorta Diameter 3.2     RV  Basal Diastolic Dimension 3.65     TAPSE 1.6     Summary --     Left ventricular chamber dimension, wall thickness and function are normal. LVEF is 60-65%. No regional wall motion abnormalities. Diastolic function is indeterminate. Right ventricle is normal in size. There is mildly reduced systolic function. Right   atrium is mildly dilated The tricuspid valve is normal in structure with no significant stenosis. There is moderate, central tricuspid regurgitation. RVSP is 35-52mmHg. No significant pericardial effusion.       Narrative:      Name:     [name]  Age:     66 years  DOB:     1955-04-26  Gender:     Female  MRN:     [id number]  Wt:     174 lb  BSA:     1.93 m2  HR:     66 bpm  Systolic BP:     371 mmHg  Diastolic BP:     69 mmHg  Technical Quality:     Fair  Exam Date/Time:     09/03/2022 8:28 AM  Study Site:     [facility]  Exam Type:     ECHO ADULT TTE COMPLETE    Study Info  Indications      Acute heart failure -  Procedure    Complete two-dimensional, color flow and spectral Doppler transthoracic  echocardiogram is performed.    Staff  Sonographer:     Reinaldo Raddle RDCS, RVT  Ordering Physician:     Cruz Condon    65 in      History/Risk Factors  Hypertension:     Yes  Hyperlipidemia:     Yes  Pulmonary Hypertension:     Yes  Coronary Artery Disease     Yes  Arrhythmias:     Atrial fibrillation  Diabetes Mellitus:     Type II    Prior Cardiac Studies  Date of Prior Echo:     May 06, 2021      Summary    * Left ventricular chamber dimension, wall thickness and function are  normal. LVEF is 60-65%. No regional wall motion abnormalities. Diastolic  function is indeterminate.    * Right ventricle is normal in size. There is mildly reduced systolic  function.    * Right atrium is mildly dilated.    * The tricuspid valve is normal in structure with no significant stenosis.  There is  moderate, central tricuspid regurgitation. RVSP is 35-65mmHg.    * No significant pericardial effusion.      Findings  Left  Ventricle    Left ventricular chamber dimension, wall thickness and function are normal.  LVEF is 60-65%. No regional wall motion abnormalities. Diastolic function is  indeterminate.  Right Ventricle    Right ventricle is normal in size. There is mildly reduced systolic  function.      Left Atrium    Left atrium is normal in size.    Right Atrium    Right atrium is mildly dilated.    Atrial Septum    No evidence of interatrial shunt by color Doppler.      Aortic Valve    The aortic valve is tricuspid with no significant regurgitation or stenosis.    Pulmonary Valve    The pulmonic valve is normal in structure with no significant regurgitation  or stenosis.    Mitral Valve    The mitral valve is normal in structure with no significant regurgitation or  stenosis.    Tricuspid Valve    The tricuspid valve is normal in structure with no significant stenosis.  There is moderate, central tricuspid regurgitation. RVSP is 35-52mmHg.      Pulmonary Arteries    Normal estimated right ventricular systolic pressure.    Aorta    Aortic root and ascending aorta are normal in size.    Inferior Vena Cava    The IVC is normal in size with normal respiratory variation.    Pericardium / Pleural Effusion    No significant pericardial effusion.      Measurements  2D Measurements  ----------------------------------------------------------------------  Name                                 Value        Normal  ----------------------------------------------------------------------    Parasternal 2D  ----------------------------------------------------------------------   IVS Diastolic Thickness  (2D)                               0.97 cm     0.60-0.90   LVID Diastole (2D)                 3.69 cm     3.80-5.20    LVIW Diastolic Thickness  (2D)                               0.86 cm     0.60-0.95   LVID Systole (2D)                  2.46 cm     2.20-3.50   LVOT Diameter                      2.40 cm                 LA Dimension (2D)                   3.60 cm     2.70-3.80   Ao Root Diameter (2D)              3.40 cm     2.70-3.70   Ao Root Diameter (2D) Index     1.76 cm/m2         <  2.20   Sinus of Valsalva Diameter         3.46 cm     2.70-3.30   Sinus of Valsalva Index         1.80 cm/m2     1.60-2.00    Ao Sinotub Junction  Diameter                           3.20 cm     2.30-2.90   Prox Asc Ao Diameter               3.20 cm     2.30-3.10   Prox Asc Ao Diameter Index      1.66 cm/m2     1.30-1.90     LV Ejection Fraction 2D  ----------------------------------------------------------------------  LV EF (BP MOD)                        66 %         54-74     Apical 2D Dimensions  ----------------------------------------------------------------------   RV Basal Diastolic  Dimension                          3.65 cm     2.50-4.10    RV Mid-Cavity Diastolic  Dimension                          3.07 cm     1.90-3.50   RV Diastolic Length (4C)           8.20 cm     5.90-8.30   LA Volume Index (BP A-L)        24.24 ml/m2   16.00-34.00   RA Area (4C)                     19.50 cm2       <=18.00   RV FAC (4C)                           40 %          >=35  M-mode Measurements  ----------------------------------------------------------------------  Name                                 Value        Normal  ----------------------------------------------------------------------    M-Mode  ----------------------------------------------------------------------  Ao Root Diameter (MM)              3.40 cm                 TAPSE                              1.60 cm        >=1.60  LVOT/Aortic Valve Doppler Measurements  ----------------------------------------------------------------------  Name                                 Value        Normal  ----------------------------------------------------------------------    LVOT Doppler  ----------------------------------------------------------------------  LVOT VTI  19.70 cm                 LVOT Peak Velocity                 1.03 m/s                 LVOT Mean Gradient               2.00 mmHg                 LVOT SV                              89 ml                 LVOT SV Index                    46 ml/m\S\2                   AoV Doppler  ----------------------------------------------------------------------  AV VTI                            26.00 cm                 AV Mean Gradient                    4 mmHg           <20   AV Area (Cont Eq VTI)             3.43 cm2        >=3.00   AV Area Index (Cont Eq VTI)     1.78 cm2/m2  RVOT/Pulmonic Valve Doppler Measurements  ----------------------------------------------------------------------  Name                                 Value        Normal  ----------------------------------------------------------------------    PV Doppler  ----------------------------------------------------------------------  PV VTI                            25.70 cm                 PV Peak Velocity                  1.25 m/s                 PV Mean Gradient                 3.00 mmHg                   PV Regurgitation Doppler  ----------------------------------------------------------------------   PR Peak End Diastolic  Velocity                          1.19 m/s  Mitral Valve Measurements  ----------------------------------------------------------------------  Name                                 Value        Normal  ----------------------------------------------------------------------    MV Doppler  ----------------------------------------------------------------------  MV E Peak Velocity  0.95 m/s                 MV Decel Time                       190 ms                   MV Annular TDI  ----------------------------------------------------------------------  MV Septal e' Velocity             0.09 m/s        >=0.07   MV E/e' (Septal)                     11.05        <=8.00   MV Lateral e' Velocity            0.07 m/s        >=0.10   MV E/e' (Lateral)                    14.53                  MV E/e' (Average)                    12.79       <=14.00  Tricuspid Valve Measurements  ----------------------------------------------------------------------  Name                                 Value        Normal  ----------------------------------------------------------------------    TV Doppler  ----------------------------------------------------------------------  TV E Peak Velocity                0.49 m/s                   TV Regurgitation Doppler  ----------------------------------------------------------------------  TR Peak Velocity                  3.02 m/s        <=2.80   TR Peak Gradient                   36 mmHg                 RA Pressure                         8 mmHg           <=3   RV Systolic Pressure               44 mmHg           <36   TR ERO (PISA)                     0.28 cm2                   TV Annular TDI  ----------------------------------------------------------------------  TV Lateral Ann s' Velocity        0.11 m/s     0.10-0.19  Aorta / Venous Measurements  ----------------------------------------------------------------------  Name                                 Value        Normal  ----------------------------------------------------------------------    IVC/SVC  ----------------------------------------------------------------------  IVC Diameter (Exp 2D)              2.32 cm        <=2.10    IVC Diameter Percent Change  (2D)                                  23 %          >=50      Report Signatures  Finalized by Ruben Reason Vlacancich  DO on 09/03/2022 10:35 AM  Preliminary by Reinaldo Raddle  RDCS, RVT on 09/03/2022 09:01 AM          No results found for this or any previous visit.    Please be sure to review these imaging studies with your primary medical provider.

## 2022-09-05 NOTE — Discharge Instr - Other Orders (Signed)
Follow-up   Cardiology appointment 3-5 days and 3 weeks post-discharge recommended:  Patient has an appointment on 09/07/22 with her Primary Cardiologist, Dr. Fransisco Beau.     PCP appointment 2 weeks post-discharge recommended.      Cardiac Rehab Referral:   Benefits of Cardiac Rehab discussed. Written information provided to the patient for discussion at follow-up Cardiology appointment.     Patient education   (x) 90 days Commitment to Care  (x) CHF Warning Zones  (x) Take all medications as directed.  Continue to take your medications unless directed by your doctor to stop.  (x) Heart Healthy Diet:  Limit the amount of salt/sodium you eat to 2,000 mg per day.  (x) Fluid Restriction: Limit the amount of fluid you take in each day. You should drink no more than 6 to 8 cups of fluids (50 - 60 ounces) per day unless your doctor gives you other directions.  ( ) Smoking Cessation Counseling  ( ) Substance Use Cessation Counseling  ( ) ETOH counseling     Patient reminded to call Cardiologist if even one of the following signs/symptoms are present:     Weigh at same time of day, every morning after voiding. Notify Cardiologist of weight gain of 2 pounds or more in a day or 3-5 pounds or more in 1 week.  Check blood pressure every day, or as directed by doctor.  Report increases in blood pressure.  Increased swelling or tightness in hands/abdomen/legs/ankles/feet.  Increased shortness of breath.  Increased number of pillows needed to sleep at night  New or more frequent chest pain or tightness.  New onset of dizziness or lightheadedness after standing up.  Trouble doing activities you normally do.

## 2022-09-05 NOTE — Progress Notes (Signed)
IN-PATIENT HEART FAILURE PATIENT CARE NURSE NAVIGATOR PROGRESS NOTE    Patient name: [name]  Patient DOB: 25-Oct-1955   Admission date: 09/03/2022    Spoke with patient via Interpreter ID [phone]. Provided all documents in native language (Spanish).  Patient is resting comfortably in bed.  She denies denies shortness of breath and states that she is feeling better since admission). Reviewed HF Education packet (in [place]) in detail; all questions answered.    What matters most to the patient? "My daughter works with Dr. Fransisco Beau."  Who cooks for the patient? Daughter/self  Who provides transportation? Daughter/self  Patient has standing scale and blood pressure cuff.    ECHO/EF: 09/03/22 - Summary    * Left ventricular chamber dimension, wall thickness and function are  normal. LVEF is 60-65%. No regional wall motion abnormalities. Diastolic  function is indeterminate.    * Right ventricle is normal in size. There is mildly reduced systolic  function.    * Right atrium is mildly dilated.    * The tricuspid valve is normal in structure with no significant stenosis.  There is moderate, central tricuspid regurgitation. RVSP is 35-2mmHg.    * No significant pericardial effusion.    Admission weight:   09/02/22 - 81.7 kg (bed scale); 79.9 kg (method not documented)  Daily Weight: 09/05/22 - 76 kg (standing); 7/28 - 76.2 kg (standing) 7/27 - 78.9 kg (standing)    Heart Healthy Diet: 2 gram sodium restriction  Fluid restriction: 1,500 ml    Malnutrition Screening  Have you recently lost weight without trying? No 0  Have you been eating poorly because of a decreased appetite? No 0     Follow-up   Cardiology appointment 3-5 days and 3 weeks post-discharge recommended:  Patient has an appointment on 09/07/22 with her Primary Cardiologist, Dr. Fransisco Beau.    PCP appointment 2 weeks post-discharge recommended.     Cardiac Rehab Referral:   Benefits of Cardiac Rehab discussed. Written information provided to the patient for discussion at  follow-up Cardiology appointment.    Patient education minutes: 20 minutes  (x) 90 days Commitment to Care  (x) CHF Warning Zones  (x) Take all medications as directed.  Continue to take your medications unless directed by your doctor to stop.  (x) Heart Healthy Diet:  Limit the amount of salt/sodium you eat to 2,000 mg per day.  (x) Fluid Restriction: Limit the amount of fluid you take in each day. You should drink no more than 6 to 8 cups of fluids (50 - 60 ounces) per day unless your doctor gives you other directions.  ( ) Smoking Cessation Counseling  ( ) Substance Use Cessation Counseling  ( ) ETOH counseling    Patient reminded to call Cardiologist if even one of the following signs/symptoms are present:    Weigh at same time of day, every morning after voiding. Notify Cardiologist of weight gain of 2 pounds or more in a day or 3-5 pounds or more in 1 week.  Check blood pressure every day, or as directed by doctor.  Report increases in blood pressure.  Increased swelling or tightness in hands/abdomen/legs/ankles/feet.  Increased shortness of breath.  Increased number of pillows needed to sleep at night  New or more frequent chest pain or tightness.  New onset of dizziness or lightheadedness after standing up.  Trouble doing activities you normally do.    Geralynn Ochs, MS, BSN, RN, CCRN-CMC  Patient Care Nurse Navigator, Heart Failure & Sepsis  [facility]  [phone]

## 2022-09-05 NOTE — Progress Notes (Signed)
[facility]  Progress Note      Date Time: 09/05/22 9:46 AM  Patient Name: [name], [name]     Assessment:       pEF. Acute on chronic. She has been getting both bumex 2mg  IV and PO and lasix 40mg  IV. D/c IV lasix. She is on bumex 1mg  BID 4 days a week and bumex 1mg  daily 3 days a week.   She has not been weighing herself at home. Daughter thinks her normal weight is ~178lbs. It is 167 lbs this morning. Instructed patient on daily weights.   Cont outpatient diuretic regimen upon discharge and follow daily standing weights.   Afib. In AF with controlled to slow ventricular response. Recommend decreasing carvedilol to 6.25mg  BID.  On Pradaxa.   Tricuspid regurg. Chronic mild-moderate.   CAD. Non-obstructive disease. Cont medical management.   HTN. BPs controlled. Monitor with medication changes.  CKD. Suspect baseline Cr is ~1.3. monitor with diuresis.   Plan:     Discussed with daughter importance of medication compliance and close monitoring of blood pressure and weight.  She will follow-up with Dr. Fransisco Beau on Wednesday.  Consideration will be given to starting Aldactone 12 and half p.o. daily.  Currently it was not started due to renal insufficiency.  Will Embden home on current diuretic regimen and Jardiance, lisinopril combination.      Signed by: Fortino Sic, MD  [facility])  [facility] [phone], [phone]  Office [phone]  MD LINE [phone]    Problem List[1]    Subjective:   Denies chest pain, SOB or palpitations.    Medications:   Current Medications[2]  Reviewed  Physical Exam:     Vitals:    09/05/22 0717   BP: 132/83   Pulse: 61   Resp: 19   Temp: 97.7 F (36.5 C)   SpO2: 90%     Temp (24hrs), Avg:98.2 F (36.8 C), Min:97.7 F (36.5 C), Max:98.4 F (36.9 C)      Telemetry reviewed no changes.     Intake and Output Summary (Last 24 hours) at Date Time    Intake/Output Summary (Last 24 hours) at 09/05/2022 0946  Last data filed at 09/05/2022 0600  Gross per 24 hour   Intake 640  ml   Output 750 ml   Net -110 ml       General Appearance:  Breathing comfortable, no acute distress  Head:  normocephalic  Eyes:  EOM's intact  Neck:  No carotid bruit or jugular venous distension, brisk carotid upstroke  Lungs:  Clear to auscultation throughout, no wheezes, rhonchi or rales, good respiratory effort   Chest Wall:  No tenderness or deformity  Heart:  S1, S2 normal, no S3,, irregular rhythm, PMI not displaced, no rub   Abdomen:  Soft, non-tender, positive bowel sounds, no hepatojugular reflux  Extremities:  No cyanosis, clubbing or edema  Pulses:  Equal radial pulses, 4/4 symmetric  Neurologic:  Alert and oriented x3, mood and affect normal    Labs:   Recent CMP   Recent Labs     09/05/22  0439   Glucose 175*   BUN 28*   Creatinine 1.1*   Sodium 139   Chloride 102   CO2 26   Calcium 8.8     Recent CARDIAC ENZYMES No results for input(s): "TROPONIN", "ISTATTROPONI", "CK" in the last 24 hours.    Invalid input(s): "TROPONINT", "CKMB[24"  Recent TSH Invalid input(s): "TSH[24"  Recent PT/PTT No results for  input(s): "INR", "PTT" in the last 24 hours.    Invalid input(s): "PTI", "COUM", "COUMP", "ACOAG", "ACOAP[24"  Recent CBC WITH DIFF   Recent Labs     09/05/22  0439   RBC 4.88   Hemoglobin 15.0*   Hematocrit 43.8*   MCV 89.8   MCHC 34.2   RDW 13   MPV 11.9   Platelet Count 193     Recent LIPID PANEL No results for input(s): "CHOL", "TRIG", "HDL" in the last 24 hours.    Invalid input(s): "LDLC", "VLDLC", "LRAT[24"  Recent ABG No results for input(s): "TEMP", "FIO2", "RATE", "MODE", "ETCO2", "PEEP" in the last 24 hours.    Invalid input(s): "APH", "APCO2", "APO2", "AHCO3", "ATCO2", "ABE", "AOSAT", "ABGS", "ALLEN", "STATS", "O2DEL", "O2FLO", "PRESS", "VNTMN", "PRSUP", "TIVOL[24"  Recent echo@    [facility]:   Radiological Procedure reviewed.    ECG-atrial fibrillation rate 80 bpm    This note was generated by the Epic EMR system/ Dragon speech recognition and may contain inherent errors or omissions not  intended by the user. Grammatical errors, random word insertions, deletions and pronoun errors  are occasional consequences of this technology due to software limitations. Not all errors are caught or corrected. If there are questions or concerns about the content of this note or information contained within the body of this dictation they should be addressed directly with the author for clarification.        [1]   Patient Active Problem List  Diagnosis    Glaucoma    Type 2 diabetes mellitus without complication, with long-term current use of insulin    Hypertension    Hyperlipidemia    Fatty liver    Non-rheumatic mitral regurgitation    Venous insufficiency    Uterovaginal prolapse, complete    CAD (coronary artery disease)    Atrial fibrillation    Diastolic heart failure    Nodule of right lung    Pulmonary hypertension    History of COVID-19    Pulmonary edema, acute    Type 2 diabetes mellitus with obesity    Moderate nonproliferative diabetic retinopathy    Vaginal pessary in situ    Stage 3b chronic kidney disease    Fluid overload    Acute on chronic congestive heart failure, unspecified heart failure type   [2]   Current Facility-Administered Medications   Medication Dose Route Frequency Provider Last Rate Last Admin    acetaminophen (TYLENOL) tablet 650 mg  650 mg Oral TID PRN Dorothyann Gibbs, MD        atorvastatin (LIPITOR) tablet 40 mg  40 mg Oral Daily Dorothyann Gibbs, MD   40 mg at 09/04/22 0855    benzocaine-menthol (CEPACOL/CHLORASEPTIC) lozenge 1 lozenge  1 lozenge Buccal Q2H PRN Dorothyann Gibbs, MD        benzonatate (TESSALON) capsule 100 mg  100 mg Oral TID PRN Dorothyann Gibbs, MD        bumetanide (BUMEX) tablet 1 mg  1 mg Oral Q Mon/Wed/Fri (AM) Carolynne Edouard, MD        bumetanide [facility]) tablet 2 mg  2 mg Oral Q Sun/Tue/Thu/Sat (AM) Cruz Condon, MD   2 mg at 09/04/22 0854    carboxymethylcellulose (REFRESH TEARS) 0.5 % ophthalmic solution 1 drop  1 drop Both Eyes TID PRN  Dorothyann Gibbs, MD   1 drop at 09/04/22 2116    carvedilol (COREG) tablet 6.25 mg  6.25 mg Oral Q12H [facility] Carolynne Edouard, MD  6.25 mg at 09/04/22 2110    dabigatran (PRADAXA) capsule 150 mg  150 mg Oral Q12H [facility] Carolynne Edouard, MD   150 mg at 09/04/22 2110    dextrose (GLUCOSE) 40 % oral gel 15 g of glucose  15 g of glucose Oral PRN Dorothyann Gibbs, MD   15 g of glucose at 09/03/22 4627    Or    dextrose (D10W) 10% bolus 125 mL  12.5 g Intravenous PRN Dorothyann Gibbs, MD        Or    dextrose 50 % bolus 12.5 g  12.5 g Intravenous PRN Dorothyann Gibbs, MD        Or    glucagon (rDNA) (GLUCAGEN) injection 1 mg  1 mg Intramuscular PRN Dorothyann Gibbs, MD        empagliflozin (JARDIANCE) tablet 25 mg  25 mg Oral Daily Carolynne Edouard, MD   25 mg at 09/04/22 1248    hydrALAZINE (APRESOLINE) injection 10 mg  10 mg Intravenous Q6H PRN Dorothyann Gibbs, MD        insulin glargine (LANTUS) injection 15 Units  15 Units Subcutaneous QAM Coralyn Pear B, MD   15 Units at 09/04/22 0855    insulin lispro injection 1-8 Units  1-8 Units Subcutaneous TID [facility] Dorothyann Gibbs, MD   5 Units at 09/04/22 1251    lisinopril (ZESTRIL) tablet 40 mg  40 mg Oral Daily Laverta Baltimore T, MD   40 mg at 09/04/22 0854    magnesium sulfate 1g in dextrose 5% IVPB (premix)  1 g Intravenous PRN Dorothyann Gibbs, MD        melatonin tablet 3 mg  3 mg Oral QHS PRN Dorothyann Gibbs, MD        naloxone [facility]) injection 0.2 mg  0.2 mg Intravenous PRN Dorothyann Gibbs, MD        potassium & sodium phosphates (PHOS-NAK) 280-160-250 MG packet 2 packet  2 packet Oral PRN Dorothyann Gibbs, MD        potassium chloride (KLOR-CON M20) CR tablet 0-40 mEq  0-40 mEq Oral PRN Dorothyann Gibbs, MD   40 mEq at 09/03/22 0350    And    potassium chloride 10 mEq in 100 mL IVPB (premix)  10 mEq Intravenous PRN Dorothyann Gibbs, MD   Stopped at 09/03/22 1106    saline (OCEAN NASAL SPRAY) 0.65 % nasal solution 2 spray  2 spray  Each Nare Q4H PRN Dorothyann Gibbs, MD        timolol (TIMOPTIC) 0.5 % ophthalmic solution 1 drop  1 drop Both Eyes Daily Dorothyann Gibbs, MD   1 drop at 09/04/22 [phone]

## 2022-09-07 ENCOUNTER — Other Ambulatory Visit: Payer: Self-pay

## 2022-09-07 NOTE — Progress Notes (Signed)
Ambulatory Care Management Post-Discharge Support    Care Coordinator attempted to contact the patients daughter at [phone] to inquire about assistance in scheduling a follow up cardiology and pcp appointment as part of the HF initiative. The patient did not answer but a detailed message was left explaining the reason for my call.    Gerald Leitz,   Care [facility]   [facility]  [address] [place], [place] [address]  T [phone]

## 2022-09-09 NOTE — Progress Notes (Signed)
Name: [name]    ### Patient Details  Date of Birth: 09/01/1955  MRN: [id number]    ### Encounter Details  Arrival Date: 09/02/2022 01:08 PM EDT  Discharge Date: 09/05/2022 05:33 PM EDT  Encounter ID: [id number] TCM7/29/2024   5:33:00PM    ### Related interaction  [place] - TCM V2 Post Discharge Outreach (Post Discharge TCM V2 Outreach 1) ([email or web address])    ### Required Interventions and Feedback     Call Status         Voicemail Left:     Yes (edited by NG on 09/09/2022 12:07 PM EDT)    Comments::     Attempted to contact pt. at [phone]. Left vm message on machine for pt. to return the call re: CHF Management.  Gerald Leitz, Care Coordinator also reached out on 09/07/22. (edited by NG on 09/09/2022 12:08 PM EDT)     General Status          General Status - Issues:     Related to original diagnosis (edited by NG on 09/09/2022 12:08 PM EDT)    Comments:     Unreached (edited by NG on 09/09/2022 12:08 PM EDT)     General Status - Actions Taken         Other (Add Details in Comments):     Yes (edited by NG on 09/09/2022 12:08 PM EDT)    Comments:     Unreached (edited by NG on 09/09/2022 12:08 PM EDT)     Additional Questions         Comments:     Unreached (edited by NG on 09/09/2022 12:08 PM EDT)     Interpreter Used         Interpreter Used:     Yes (edited by NG on 09/09/2022 12:06 PM EDT)    If yes, what language?:     Spanish [phone] (edited by NG on 09/09/2022 12:06 PM EDT)    Julieanne Cotton, MS, [facility]  Pronouns She/Her  Ambulatory Case Manager    [facility]  [address]  [place], [place] [address]  [place] [phone]  [email or web address]

## 2022-10-07 ENCOUNTER — Other Ambulatory Visit (FREE_STANDING_LABORATORY_FACILITY): Payer: Medicare PPO

## 2022-10-07 DIAGNOSIS — I509 Heart failure, unspecified: Secondary | ICD-10-CM

## 2022-10-07 DIAGNOSIS — Z5181 Encounter for therapeutic drug level monitoring: Secondary | ICD-10-CM

## 2022-10-07 DIAGNOSIS — N289 Disorder of kidney and ureter, unspecified: Secondary | ICD-10-CM

## 2022-10-07 LAB — BASIC METABOLIC PANEL
Anion Gap: 7 (ref 5.0–15.0)
BUN: 18 mg/dL (ref 7–21)
CO2: 28 meq/L (ref 17–29)
Calcium: 8.4 mg/dL — ABNORMAL LOW (ref 8.5–10.5)
Chloride: 105 meq/L (ref 99–111)
Creatinine: 1.1 mg/dL — ABNORMAL HIGH (ref 0.4–1.0)
GFR: 54.4 mL/min/{1.73_m2} — ABNORMAL LOW (ref >=60.0)
Glucose: 120 mg/dL — ABNORMAL HIGH (ref 70–100)
Hemolysis Index: 2 {index}
Potassium: 4.8 meq/L (ref 3.5–5.3)
Sodium: 140 meq/L (ref 135–145)

## 2022-10-13 ENCOUNTER — Encounter (INDEPENDENT_AMBULATORY_CARE_PROVIDER_SITE_OTHER): Payer: Self-pay | Admitting: Internal Medicine

## 2022-10-21 ENCOUNTER — Other Ambulatory Visit (INDEPENDENT_AMBULATORY_CARE_PROVIDER_SITE_OTHER): Payer: Self-pay | Admitting: Internal Medicine

## 2022-11-22 ENCOUNTER — Ambulatory Visit (INDEPENDENT_AMBULATORY_CARE_PROVIDER_SITE_OTHER): Payer: Medicare PPO | Admitting: Internal Medicine

## 2022-11-25 ENCOUNTER — Other Ambulatory Visit (INDEPENDENT_AMBULATORY_CARE_PROVIDER_SITE_OTHER): Payer: Medicare PPO

## 2022-11-25 DIAGNOSIS — I5030 Unspecified diastolic (congestive) heart failure: Secondary | ICD-10-CM

## 2022-11-25 LAB — BASIC METABOLIC PANEL
Anion Gap: 9 (ref 5.0–15.0)
BUN: 17 mg/dL (ref 7–21)
CO2: 28 meq/L (ref 17–29)
Calcium: 8.8 mg/dL (ref 8.5–10.5)
Chloride: 106 meq/L (ref 99–111)
Creatinine: 1.1 mg/dL — ABNORMAL HIGH (ref 0.4–1.0)
GFR: 54.4 mL/min/{1.73_m2} — ABNORMAL LOW (ref >=60.0)
Glucose: 84 mg/dL (ref 70–100)
Hemolysis Index: 12 {index}
Potassium: 4.2 meq/L (ref 3.5–5.3)
Sodium: 143 meq/L (ref 135–145)

## 2022-11-29 ENCOUNTER — Encounter (INDEPENDENT_AMBULATORY_CARE_PROVIDER_SITE_OTHER): Payer: Self-pay | Admitting: Internal Medicine

## 2022-12-16 ENCOUNTER — Encounter (INDEPENDENT_AMBULATORY_CARE_PROVIDER_SITE_OTHER): Payer: Self-pay | Admitting: Internal Medicine

## 2022-12-16 ENCOUNTER — Other Ambulatory Visit (FREE_STANDING_LABORATORY_FACILITY): Payer: Medicare PPO | Admitting: Student in an Organized Health Care Education/Training Program

## 2022-12-16 DIAGNOSIS — L97909 Non-pressure chronic ulcer of unspecified part of unspecified lower leg with unspecified severity: Secondary | ICD-10-CM

## 2022-12-19 LAB — CULTURE AND GRAM STAIN, AEROBIC BACTERIA, WOUND/TISSUE/FLUID: Gram Stain: NONE SEEN — AB

## 2022-12-21 ENCOUNTER — Telehealth (INDEPENDENT_AMBULATORY_CARE_PROVIDER_SITE_OTHER): Payer: Self-pay | Admitting: Internal Medicine

## 2022-12-21 DIAGNOSIS — E1129 Type 2 diabetes mellitus with other diabetic kidney complication: Secondary | ICD-10-CM

## 2022-12-21 DIAGNOSIS — R809 Proteinuria, unspecified: Secondary | ICD-10-CM

## 2022-12-21 MED ORDER — GLUCOSE BLOOD VI STRP
ORAL_STRIP | 2 refills | Status: DC
Start: 2022-12-21 — End: 2022-12-26

## 2022-12-21 MED ORDER — INSULIN SYRINGE-NEEDLE U-100 31G X 5/16" 1 ML MISC
2 refills | Status: DC
Start: 2022-12-21 — End: 2022-12-26

## 2022-12-21 NOTE — Telephone Encounter (Signed)
Patient called (Spanish Speaking) to request syringes and test strips to [employer]. She is scheduled for appt with PCP on 12/26/22. She is completely out.

## 2022-12-21 NOTE — Telephone Encounter (Signed)
Dr. Mervin Kung sent both refills.

## 2022-12-22 LAB — LAB USE ONLY - CULTURE ANAEROBIC BACTERIA, WOUND/TISSUE/FLUID: Culture Anaerobic: NO GROWTH

## 2022-12-26 ENCOUNTER — Ambulatory Visit (INDEPENDENT_AMBULATORY_CARE_PROVIDER_SITE_OTHER): Payer: Medicare PPO | Admitting: Internal Medicine

## 2022-12-26 ENCOUNTER — Encounter (INDEPENDENT_AMBULATORY_CARE_PROVIDER_SITE_OTHER): Payer: Self-pay | Admitting: Internal Medicine

## 2022-12-26 VITALS — BP 170/82 | HR 62 | Temp 97.7°F | Wt 179.0 lb

## 2022-12-26 DIAGNOSIS — I480 Paroxysmal atrial fibrillation: Secondary | ICD-10-CM

## 2022-12-26 DIAGNOSIS — E7800 Pure hypercholesterolemia, unspecified: Secondary | ICD-10-CM

## 2022-12-26 DIAGNOSIS — I10 Essential (primary) hypertension: Secondary | ICD-10-CM

## 2022-12-26 DIAGNOSIS — E1129 Type 2 diabetes mellitus with other diabetic kidney complication: Secondary | ICD-10-CM

## 2022-12-26 DIAGNOSIS — Z23 Encounter for immunization: Secondary | ICD-10-CM

## 2022-12-26 DIAGNOSIS — Z794 Long term (current) use of insulin: Secondary | ICD-10-CM

## 2022-12-26 DIAGNOSIS — R809 Proteinuria, unspecified: Secondary | ICD-10-CM

## 2022-12-26 LAB — COMPREHENSIVE METABOLIC PANEL
ALT: 14 U/L (ref <=55)
AST (SGOT): 23 U/L (ref <=41)
Albumin/Globulin Ratio: 0.9 (ref 0.9–2.2)
Albumin: 3.3 g/dL — ABNORMAL LOW (ref 3.5–5.0)
Alkaline Phosphatase: 118 U/L — ABNORMAL HIGH (ref 37–117)
Anion Gap: 6 (ref 5.0–15.0)
BUN: 17 mg/dL (ref 7–21)
Bilirubin, Total: 0.4 mg/dL (ref 0.2–1.2)
CO2: 25 meq/L (ref 17–29)
Calcium: 8 mg/dL — ABNORMAL LOW (ref 8.5–10.5)
Chloride: 108 meq/L (ref 99–111)
Creatinine: 0.9 mg/dL (ref 0.4–1.0)
GFR: >60 mL/min/{1.73_m2} (ref >=60.0)
Globulin: 3.5 g/dL (ref 2.0–3.6)
Glucose: 105 mg/dL — ABNORMAL HIGH (ref 70–100)
Hemolysis Index: 4 {index}
Potassium: 3.8 meq/L (ref 3.5–5.3)
Protein, Total: 6.8 g/dL (ref 6.0–8.3)
Sodium: 139 meq/L (ref 135–145)

## 2022-12-26 LAB — LIPID PANEL
Cholesterol / HDL Ratio: 3.3 {index}
Cholesterol: 143 mg/dL (ref <=199)
HDL: 43 mg/dL (ref >=40)
LDL Calculated: 79 mg/dL (ref 0–99)
Triglycerides: 103 mg/dL (ref 34–149)
VLDL Calculated: 21 mg/dL (ref 10–40)

## 2022-12-26 LAB — HEMOGLOBIN A1C
Average Estimated Glucose: 188.6 mg/dL
Hemoglobin A1C: 8.2 % — ABNORMAL HIGH (ref 4.6–5.6)

## 2022-12-26 MED ORDER — INSULIN SYRINGE-NEEDLE U-100 31G X 5/16" 1 ML MISC
2 refills | Status: DC
Start: 2022-12-26 — End: 2023-12-28

## 2022-12-26 MED ORDER — DABIGATRAN ETEXILATE MESYLATE 150 MG PO CAPS
150.0000 mg | ORAL_CAPSULE | Freq: Two times a day (BID) | ORAL | 1 refills | Status: DC
Start: 2022-12-26 — End: 2023-03-28

## 2022-12-26 MED ORDER — CARVEDILOL 25 MG PO TABS
25.0000 mg | ORAL_TABLET | Freq: Two times a day (BID) | ORAL | 2 refills | Status: DC
Start: 2022-12-26 — End: 2023-03-28

## 2022-12-26 MED ORDER — GLUCOSE BLOOD VI STRP: ORAL_STRIP | 2 refills | Status: DC

## 2022-12-26 NOTE — Progress Notes (Signed)
  PRIMARY CARE   OFFICE VISIT                  HPI   Chief Complaint   Patient presents with    Diabetes Follow-up      HPI    67 y/o female PMH HTN, DM, HFpEF, CKD3b, Afib on Pradaxa here for follow up     Video Spanish Interpretor used [phone]     W

## 2023-01-03 ENCOUNTER — Encounter (INDEPENDENT_AMBULATORY_CARE_PROVIDER_SITE_OTHER): Payer: Self-pay | Admitting: Internal Medicine

## 2023-03-10 ENCOUNTER — Encounter (INDEPENDENT_AMBULATORY_CARE_PROVIDER_SITE_OTHER): Payer: Self-pay | Admitting: Internal Medicine

## 2023-03-28 ENCOUNTER — Ambulatory Visit (INDEPENDENT_AMBULATORY_CARE_PROVIDER_SITE_OTHER): Payer: Medicare PPO | Admitting: Internal Medicine

## 2023-03-28 ENCOUNTER — Encounter (INDEPENDENT_AMBULATORY_CARE_PROVIDER_SITE_OTHER): Payer: Self-pay | Admitting: Internal Medicine

## 2023-03-28 VITALS — BP 164/69 | HR 70 | Temp 98.2°F | Wt 179.0 lb

## 2023-03-28 DIAGNOSIS — R809 Proteinuria, unspecified: Secondary | ICD-10-CM

## 2023-03-28 DIAGNOSIS — Z794 Long term (current) use of insulin: Secondary | ICD-10-CM

## 2023-03-28 DIAGNOSIS — H6121 Impacted cerumen, right ear: Secondary | ICD-10-CM

## 2023-03-28 DIAGNOSIS — I480 Paroxysmal atrial fibrillation: Secondary | ICD-10-CM

## 2023-03-28 DIAGNOSIS — I10 Essential (primary) hypertension: Secondary | ICD-10-CM

## 2023-03-28 DIAGNOSIS — E7800 Pure hypercholesterolemia, unspecified: Secondary | ICD-10-CM

## 2023-03-28 DIAGNOSIS — I5030 Unspecified diastolic (congestive) heart failure: Secondary | ICD-10-CM

## 2023-03-28 DIAGNOSIS — E1129 Type 2 diabetes mellitus with other diabetic kidney complication: Secondary | ICD-10-CM

## 2023-03-28 DIAGNOSIS — N1832 Chronic kidney disease, stage 3b: Secondary | ICD-10-CM

## 2023-03-28 LAB — COMPREHENSIVE METABOLIC PANEL
ALT: 20 U/L (ref <=55)
AST (SGOT): 25 U/L (ref <=41)
Albumin/Globulin Ratio: 0.9 (ref 0.9–2.2)
Albumin: 3.5 g/dL (ref 3.5–5.0)
Alkaline Phosphatase: 117 U/L (ref 37–117)
Anion Gap: 11 (ref 5.0–15.0)
BUN: 28 mg/dL — ABNORMAL HIGH (ref 7–21)
Bilirubin, Total: 0.5 mg/dL (ref 0.2–1.2)
CO2: 29 meq/L (ref 17–29)
Calcium: 8.7 mg/dL (ref 8.5–10.5)
Chloride: 102 meq/L (ref 99–111)
Creatinine: 1.2 mg/dL — ABNORMAL HIGH (ref 0.4–1.0)
GFR: 49 mL/min/{1.73_m2} — ABNORMAL LOW (ref >=60.0)
Globulin: 4 g/dL — ABNORMAL HIGH (ref 2.0–3.6)
Glucose: 128 mg/dL — ABNORMAL HIGH (ref 70–100)
Hemolysis Index: 13 {index}
Potassium: 4.7 meq/L (ref 3.5–5.3)
Protein, Total: 7.5 g/dL (ref 6.0–8.3)
Sodium: 142 meq/L (ref 135–145)

## 2023-03-28 LAB — URINE MICROALBUMIN, RANDOM
Urine Creatinine: 47 mg/dL
Urine Microalbumin/Creatinine Ratio: 15 ug/mg (ref <30)
Urine Microalbumin: 7 ug/mL (ref 0.0–30.0)

## 2023-03-28 LAB — HEMOGLOBIN A1C
Average Estimated Glucose: 188.6 mg/dL
Hemoglobin A1C: 8.2 % — ABNORMAL HIGH (ref 4.6–5.6)

## 2023-03-28 MED ORDER — CARBAMIDE PEROXIDE 6.5 % OT SOLN: 5.0000 [drp] | Freq: Two times a day (BID) | OTIC | 2 refills | Status: DC

## 2023-03-28 MED ORDER — ATORVASTATIN CALCIUM 40 MG PO TABS: 40.0000 mg | ORAL_TABLET | Freq: Every evening | ORAL | 3 refills | Status: DC

## 2023-03-28 MED ORDER — NOVOLIN N 100 UNIT/ML SC SUSP: SUBCUTANEOUS | 3 refills | Status: DC

## 2023-03-28 MED ORDER — EMPAGLIFLOZIN 25 MG PO TABS: 25.0000 mg | ORAL_TABLET | Freq: Every morning | ORAL | 3 refills | Status: DC

## 2023-03-28 MED ORDER — DABIGATRAN ETEXILATE MESYLATE 150 MG PO CAPS: 150.0000 mg | ORAL_CAPSULE | Freq: Two times a day (BID) | ORAL | 1 refills | Status: DC

## 2023-03-28 MED ORDER — LISINOPRIL 40 MG PO TABS
40.0000 mg | ORAL_TABLET | Freq: Every day | ORAL | 1 refills | Status: DC
Start: 2023-03-28 — End: 2023-11-10

## 2023-03-28 NOTE — Assessment & Plan Note (Signed)
 Orders:    atorvastatin (LIPITOR) 40 MG tablet; Take 1 tablet (40 mg) by mouth every evening

## 2023-03-28 NOTE — Assessment & Plan Note (Signed)
 Orders:    dabigatran (PRADAXA) 150 MG Cap; Take 1 capsule (150 mg) by mouth every 12 (twelve) hours

## 2023-03-28 NOTE — Assessment & Plan Note (Signed)
 Orders:    Comprehensive Metabolic Panel; Future

## 2023-03-28 NOTE — Progress Notes (Addendum)
 [place] PRIMARY CARE   OFFICE VISIT                HPI     Chief Complaint   Patient presents with    Diabetes Follow-up      HPI      68 y/o female PMH HTN, DM, HFpEF, CKD3b, Afib on Pradaxa , Uterine Prolapse here for follow up     Video Spanish Interpretor used [phone]    Right ear, unable to hear from it. No pain, no ringing; no discharge     Was hospitalized in July 2024 for Acute CHF; she followed up with her cardiologist, Dr Katrine     T2DM   Most recent A1c 8.2%   Chesks BS twice a day  Am 80-100  PM 200-220  On NPH 25U qAM + 40 U qPM; jardiance     Change 30 U in AM and night time 35U  On Ace-I and Statin  Up to date with eye exam     HLD   On lipitor tolerating well     HFpEF  On Lisinopril  40 mg daily  On Bumex  alternating 2mg  (MWF)/1mg    On Coreg  12.5 BID unable to tolerate due to fatigue on the higher dose  Had hyperkalemia with spironolactone  Echo 09/04/2022 showed LVEF of 60-65% with indeterminate diastolic function, moderate tricuspid regurgitation      AF   No longer on Eliquis  due to price  Dabigatran  150 mg BID     HTN ,with white coat syndrome  On Lisinopril  40 qD  Carvedilol  12.5 mg BID  BP at home usually 120-130's      ROS   Review of Systems   Constitutional:  Negative for activity change, appetite change, chills and fever.   HENT:  Negative for ear pain.    Eyes:  Negative for visual disturbance.   Respiratory:  Negative for cough, chest tightness, shortness of breath and wheezing.    Cardiovascular:  Negative for chest pain, palpitations and leg swelling.   Gastrointestinal:  Negative for abdominal pain, constipation, diarrhea, nausea and vomiting.   Endocrine: Negative for polydipsia, polyphagia and polyuria.   Genitourinary:  Negative for dysuria.   Musculoskeletal:  Negative for back pain, gait problem and myalgias.   Skin:  Negative for rash.   Neurological:  Negative for dizziness.   Hematological:  Negative for adenopathy.   Psychiatric/Behavioral:  Negative for agitation.        Vital  Signs   BP 164/69 (BP Site: Left arm, Patient Position: Sitting, Cuff Size: Small)   Pulse 70   Temp 98.2 F (36.8 C)   Wt 81.2 kg (179 lb)   BMI 29.64 kg/m     Physical Exam   Physical Exam  Constitutional:       General: She is not in acute distress.     Appearance: Normal appearance. She is normal weight. She is not ill-appearing or toxic-appearing.   HENT:      Head: Normocephalic and atraumatic.      Right Ear: There is impacted cerumen.   Cardiovascular:      Rate and Rhythm: Normal rate and regular rhythm.      Heart sounds: Normal heart sounds. No murmur heard.     No gallop.   Pulmonary:      Effort: Pulmonary effort is normal. No respiratory distress.      Breath sounds: Normal breath sounds. No stridor. No wheezing.   Abdominal:  General: Abdomen is flat. Bowel sounds are normal. There is no distension.      Palpations: Abdomen is soft.   Musculoskeletal:         General: Normal range of motion.   Skin:     General: Skin is warm and dry.      Capillary Refill: Capillary refill takes less than 2 seconds.   Neurological:      General: No focal deficit present.      Mental Status: She is alert and oriented to person, place, and time. Mental status is at baseline.   Psychiatric:         Mood and Affect: Mood normal.         Behavior: Behavior normal.         Thought Content: Thought content normal.         Assessment/Plan     Assessment & Plan  Type 2 diabetes mellitus with microalbuminuria, with long-term current use of insulin   Today we made changes to her Insulin  dose as below due to uncontrolled DM: uterine prolapse surgery has been on hold until Hgb A1c is below 7%.   Orders:    insulin  NPH (NovoLIN  N) 100 UNIT/ML injection; INJECT 30 UNITS SUBCUTANEOUSLY WITH BREAKFST AND 35  UNITS WITH DINNERI    atorvastatin  (LIPITOR) 40 MG tablet; Take 1 tablet (40 mg) by mouth every evening    empagliflozin  (Jardiance ) 25 MG tablet; Take 1 tablet (25 mg) by mouth every morning    Urine Microalbumin,  Random; Future    Hemoglobin A1C; Future    Comprehensive Metabolic Panel; Future    Diastolic heart failure, unspecified HF chronicity         Paroxysmal atrial fibrillation    Orders:    dabigatran  (PRADAXA ) 150 MG Cap; Take 1 capsule (150 mg) by mouth every 12 (twelve) hours    Stage 3b chronic kidney disease    Orders:    Comprehensive Metabolic Panel; Future    Essential hypertension    Orders:    lisinopril  (ZESTRIL ) 40 MG tablet; Take 1 tablet (40 mg) by mouth daily    Comprehensive Metabolic Panel; Future    Pure hypercholesterolemia    Orders:    atorvastatin  (LIPITOR) 40 MG tablet; Take 1 tablet (40 mg) by mouth every evening    Impacted cerumen of right ear  Unable to successful remove wax with water irrigation; pt advised to use debrox for few days and return in one week if still having symptoms  Orders:    Ear wax removal    carbamide peroxide (DEBROX) 6.5 % otic solution; Place 5 drops into the right ear 2 (two) times daily

## 2023-05-15 ENCOUNTER — Encounter (INDEPENDENT_AMBULATORY_CARE_PROVIDER_SITE_OTHER): Payer: Self-pay | Admitting: Internal Medicine

## 2023-05-23 IMAGING — OT DXA BONE DENSITY
2 series · 2 of 2 positions shown · non-contrast
Comparison: none

REASON FOR EXAM: Screening for osteoporosis.

RISK FACTORS:  Diabetes. Asthma.
PRIOR EXAMS:  None.
METHOD:  Scans of the lumbar spine between L1-L4 and left hip were performed using dual energy X-ray densitometry (DXA)

[Series 1: — · 1 of 1 slices shown (1 of 2)]
[im 1/1]
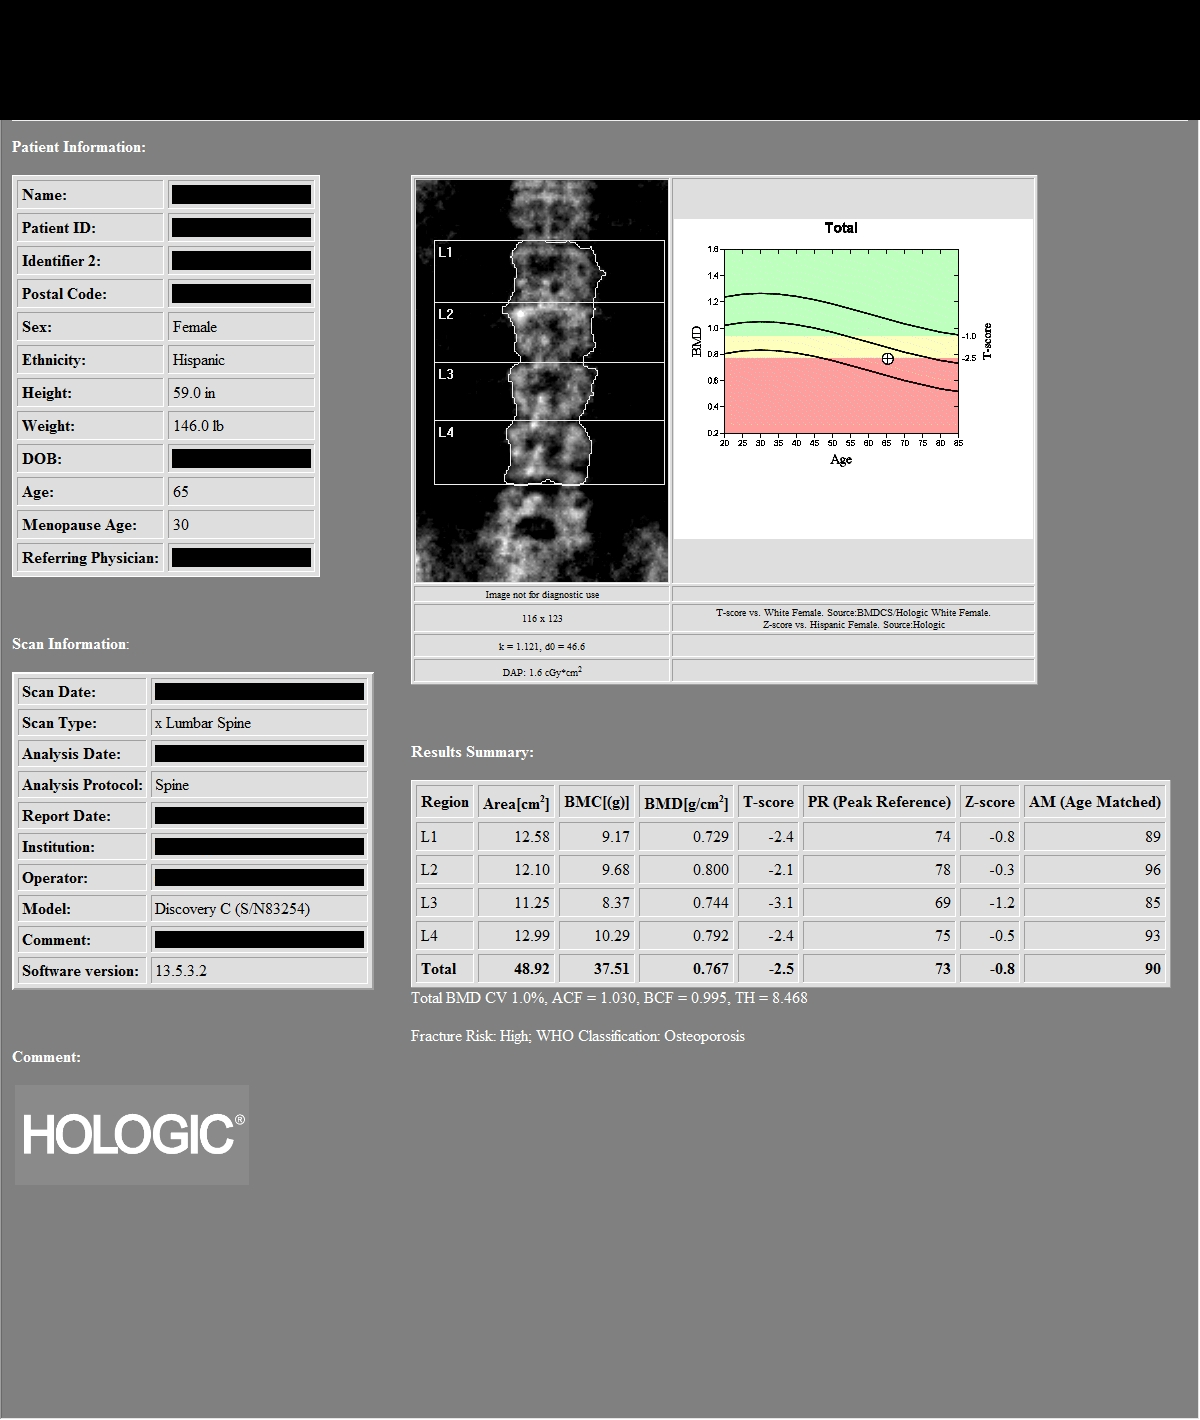

[Series 2: — · left · 1 of 1 slices shown (2 of 2)]
[im 1/1]
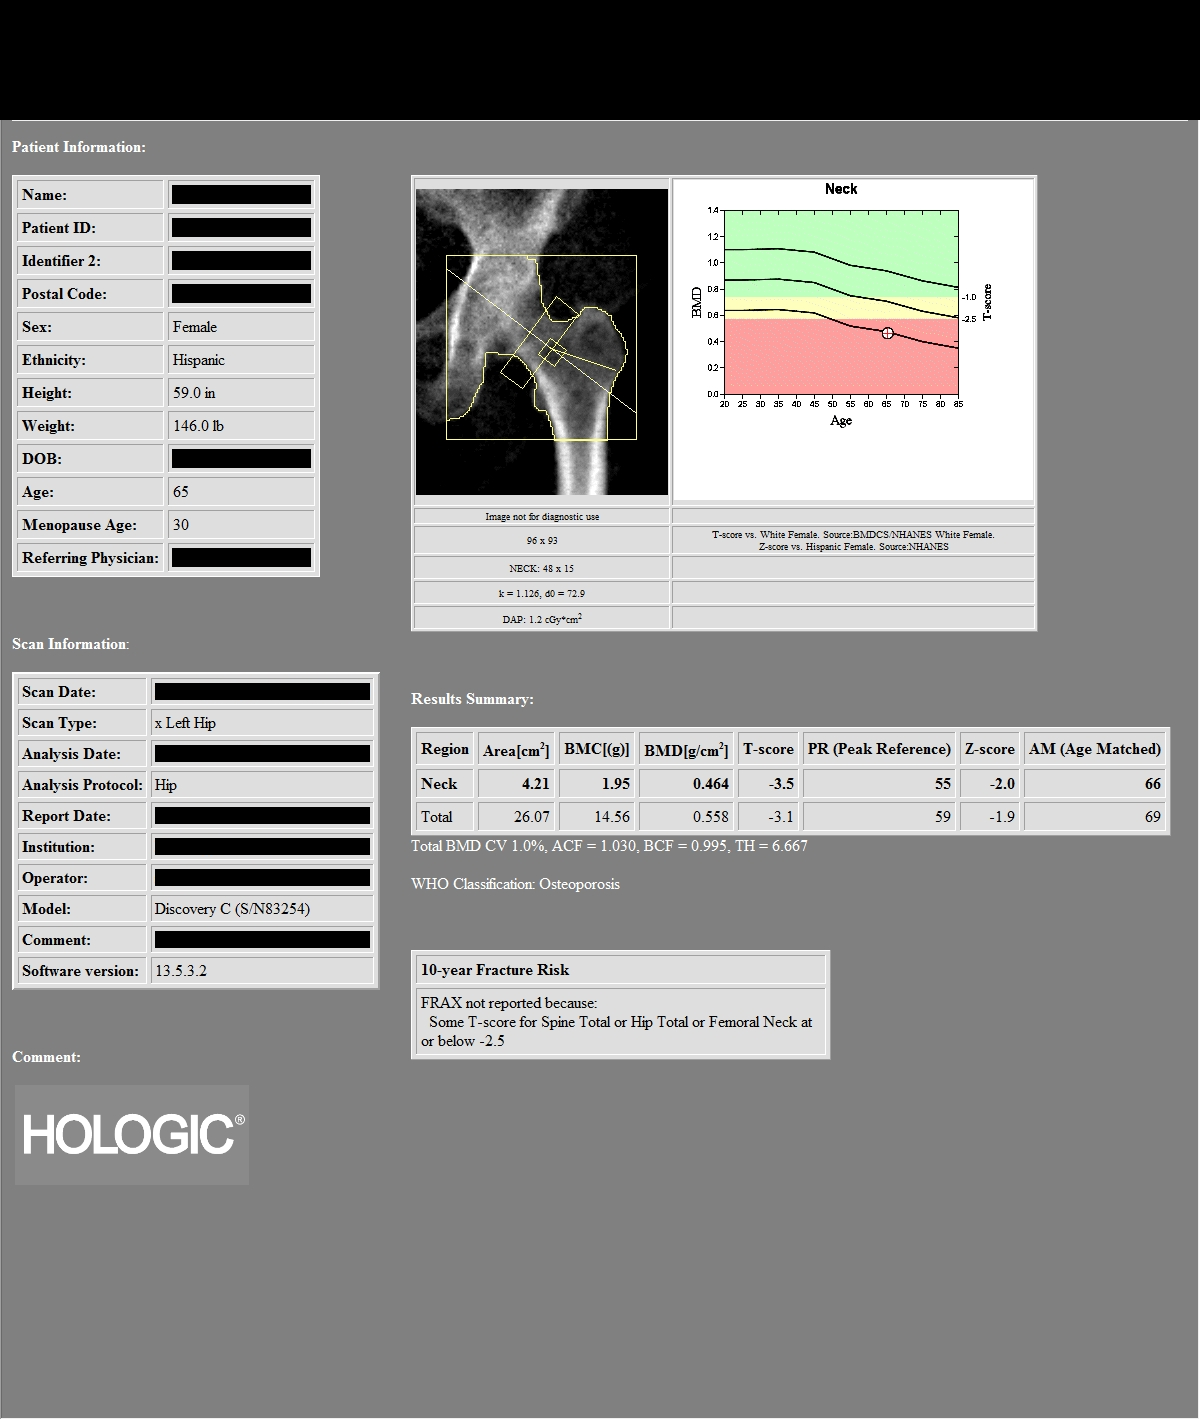

[2 of 2 positions shown; findings below may reference images not displayed]

IMPRESSION: As defined by World Health Organization, the patient meets the criteria for OSTEOPOROSIS based on lumbar spine and left hip T-scores.

PATIENT DEMOGRAPHICS:  65-year-old Hispanic female.
FINDINGS: 1.    Review of scanogram images shows no factor invalidating scan results.  

2.    The lumbar spine exam using L1-L4 regions shows average Bone Mineral Density is 0.767 gm/cm2 of Hydroxyapatite.  The T-score (comparing patient with a young adult group) is 2.5 standard deviations BELOW mean. The Z-score (comparing patient with an age-matched group) is 0.8 standard deviations BELOW mean.

3.  The left hip exam using femoral neck region of interest shows average Bone Mineral Density is 0.464 gm/cm2 of Hydroxyapatite. The T-score (comparing patient with a young adult group) is 3.5 standard deviations BELOW mean. The Z-score (comparing patient with an age-matched group) is 2.0 standard deviations BELOW mean.

RECOMMENDATIONS:  The patient states that she is taking supplements on a regular basis.  The patient should continue being a non-smoker and regular exercise to patient tolerance would be of benefit.  The patient is currently not taking prescribed medication for prevention of bone loss.  According to criteria established by the National Osteoporosis Foundation, the patient DOES meet the current indications for prescribed medical therapy.  The National Osteoporosis Foundation now recommends followup DXA scanning every two years in patients at risk regardless of whether the patient is undergoing pharmacological treatment.

## 2023-06-20 ENCOUNTER — Ambulatory Visit (INDEPENDENT_AMBULATORY_CARE_PROVIDER_SITE_OTHER): Payer: Medicare PPO | Admitting: Internal Medicine

## 2023-06-20 ENCOUNTER — Encounter (INDEPENDENT_AMBULATORY_CARE_PROVIDER_SITE_OTHER): Payer: Self-pay | Admitting: Internal Medicine

## 2023-06-20 VITALS — BP 166/79 | HR 60 | Temp 97.8°F | Wt 181.0 lb

## 2023-06-20 DIAGNOSIS — Z794 Long term (current) use of insulin: Secondary | ICD-10-CM

## 2023-06-20 DIAGNOSIS — R809 Proteinuria, unspecified: Secondary | ICD-10-CM

## 2023-06-20 DIAGNOSIS — E7800 Pure hypercholesterolemia, unspecified: Secondary | ICD-10-CM

## 2023-06-20 DIAGNOSIS — N1832 Chronic kidney disease, stage 3b: Secondary | ICD-10-CM

## 2023-06-20 DIAGNOSIS — E1129 Type 2 diabetes mellitus with other diabetic kidney complication: Secondary | ICD-10-CM

## 2023-06-20 DIAGNOSIS — I10 Essential (primary) hypertension: Secondary | ICD-10-CM

## 2023-06-20 LAB — COMPREHENSIVE METABOLIC PANEL
ALT: 24 U/L (ref <=55)
AST (SGOT): 32 U/L (ref <=41)
Albumin/Globulin Ratio: 0.9 (ref 0.9–2.2)
Albumin: 3.6 g/dL (ref 3.5–5.0)
Alkaline Phosphatase: 128 U/L — ABNORMAL HIGH (ref 37–117)
Anion Gap: 7 (ref 5.0–15.0)
BUN: 26 mg/dL — ABNORMAL HIGH (ref 7–21)
Bilirubin, Total: 0.5 mg/dL (ref 0.2–1.2)
CO2: 27 meq/L (ref 17–29)
Calcium: 8.9 mg/dL (ref 8.5–10.5)
Chloride: 104 meq/L (ref 99–111)
Creatinine: 1 mg/dL (ref 0.4–1.0)
GFR: >60 mL/min/1.73 m2 (ref >=60.0)
Globulin: 3.8 g/dL — ABNORMAL HIGH (ref 2.0–3.6)
Glucose: 129 mg/dL — ABNORMAL HIGH (ref 70–100)
Hemolysis Index: 9 {index}
Potassium: 4.7 meq/L (ref 3.5–5.3)
Protein, Total: 7.4 g/dL (ref 6.0–8.3)
Sodium: 138 meq/L (ref 135–145)

## 2023-06-20 LAB — HEMOGLOBIN A1C
Average Estimated Glucose: 203 mg/dL
Hemoglobin A1C: 8.7 % — ABNORMAL HIGH (ref 4.6–5.6)

## 2023-06-20 LAB — LIPID PANEL
Cholesterol / HDL Ratio: 3.8 {index}
Cholesterol: 177 mg/dL (ref <=199)
HDL: 46 mg/dL (ref >=40)
LDL Calculated: 101 mg/dL — ABNORMAL HIGH (ref 0–99)
Triglycerides: 149 mg/dL (ref 34–149)
VLDL Calculated: 30 mg/dL (ref 10–40)

## 2023-06-20 NOTE — Patient Instructions (Signed)
 Change your insulin  dose to 35 U in the morning and night time 30U

## 2023-06-20 NOTE — Assessment & Plan Note (Addendum)
 Cont Carvedilol  and Amlodipine   I have advised her she can take additional dose of amlodipine  for SBP>150  Orders:    Comprehensive Metabolic Panel; Future

## 2023-06-20 NOTE — Assessment & Plan Note (Addendum)
 Orders:    Comprehensive Metabolic Panel; Future

## 2023-06-20 NOTE — Assessment & Plan Note (Addendum)
 Cont Atorvastatin   Orders:    Lipid Panel; Future    Comprehensive Metabolic Panel; Future

## 2023-06-20 NOTE — Progress Notes (Signed)
 [facility]   OFFICE VISIT                HPI     Chief Complaint   Patient presents with    Hypertension      HPI      68 y/o female PMH HTN, DM, HFpEF, CKD3b, Afib on Pradaxa , Uterine Prolapse here for follow up     Video Spanish Interpretor used [phone]      T2DM   Most recent A1c 8.2%   Chesks BS twice a day  Am 80-100  PM 200-220  On NPH 35U qAM + 35 U qPM; jardiance  25 mg daily  Change 35 U in AM and night time 30U  She worries about the low numbers in the morning  On Ace-I and Statin  Up to date with eye exam     HLD   On lipitor tolerating well     HFpEF  On Lisinopril  40 mg daily  Amlodipine  5 mg daily  On Bumex  alternating 2mg  (MWF)/1mg    On Coreg  12.5 BID unable to tolerate due to fatigue on the higher dose  Had hyperkalemia with spironolactone  Echo 09/04/2022 showed LVEF of 60-65% with indeterminate diastolic function, moderate tricuspid regurgitation   She says Dr Katrine placed her back on Nifedipine  ER 30 mg however she had stop due to low BP readings     AF   No longer on Eliquis  due to price  Dabigatran  150 mg BID     HTN ,with white coat syndrome  On Lisinopril  40 qD  Carvedilol  12.5 mg BID  BP at home usually 120-130's  Nifedipine  30 mg daily restarted however pt says      ROS   Review of Systems   Constitutional:  Negative for activity change, appetite change, chills and fever.   Eyes:  Negative for visual disturbance.   Respiratory:  Negative for cough, chest tightness, shortness of breath and wheezing.    Cardiovascular:  Negative for chest pain, palpitations and leg swelling.   Gastrointestinal:  Negative for abdominal pain, constipation, diarrhea, nausea and vomiting.   Endocrine: Negative for polydipsia, polyphagia and polyuria.   Genitourinary:  Negative for dysuria.   Musculoskeletal:  Negative for back pain, gait problem and myalgias.   Skin:  Negative for rash.   Neurological:  Negative for dizziness.   Hematological:  Negative for adenopathy.   Psychiatric/Behavioral:  Negative for  agitation.        Vital Signs   BP 166/79 (BP Site: Left arm, Patient Position: Sitting, Cuff Size: Medium)   Pulse 60   Temp 97.8 F (36.6 C)   Wt 82.1 kg (181 lb)   SpO2 92%   BMI 29.97 kg/m     Physical Exam   Physical Exam  Constitutional:       General: She is not in acute distress.     Appearance: Normal appearance. She is not ill-appearing or toxic-appearing.   HENT:      Head: Normocephalic and atraumatic.   Cardiovascular:      Rate and Rhythm: Normal rate and regular rhythm.      Heart sounds: Normal heart sounds. No murmur heard.     No gallop.   Pulmonary:      Effort: Pulmonary effort is normal. No respiratory distress.      Breath sounds: Normal breath sounds. No stridor. No wheezing.   Abdominal:      General: Abdomen is flat. Bowel sounds are normal. There  is no distension.      Palpations: Abdomen is soft.   Musculoskeletal:         General: Normal range of motion.   Skin:     General: Skin is warm and dry.      Capillary Refill: Capillary refill takes less than 2 seconds.   Neurological:      General: No focal deficit present.      Mental Status: She is alert and oriented to person, place, and time. Mental status is at baseline.   Psychiatric:         Mood and Affect: Mood normal.         Behavior: Behavior normal.         Thought Content: Thought content normal.         Assessment/Plan     Assessment & Plan  Type 2 diabetes mellitus with microalbuminuria, with long-term current use of insulin  (CMS/HCC)  Advised to increase morning dose to 35U and decrease night dose to 30U  Cont Jardiance   Orders:    Hemoglobin A1C; Future    Comprehensive Metabolic Panel; Future    Stage 3b chronic kidney disease (CMS/HCC)    Orders:    Comprehensive Metabolic Panel; Future    Pure hypercholesterolemia  Cont Atorvastatin   Orders:    Lipid Panel; Future    Comprehensive Metabolic Panel; Future    Primary hypertension  Cont Carvedilol  and Amlodipine   I have advised her she can take additional dose of  amlodipine  for SBP>150  Orders:    Comprehensive Metabolic Panel; Future

## 2023-06-21 ENCOUNTER — Ambulatory Visit (INDEPENDENT_AMBULATORY_CARE_PROVIDER_SITE_OTHER): Payer: Self-pay | Admitting: Internal Medicine

## 2023-06-21 DIAGNOSIS — E1129 Type 2 diabetes mellitus with other diabetic kidney complication: Secondary | ICD-10-CM

## 2023-06-29 ENCOUNTER — Encounter (INDEPENDENT_AMBULATORY_CARE_PROVIDER_SITE_OTHER): Payer: Self-pay | Admitting: Internal Medicine

## 2023-07-25 ENCOUNTER — Encounter (INDEPENDENT_AMBULATORY_CARE_PROVIDER_SITE_OTHER): Payer: Self-pay | Admitting: Internal Medicine

## 2023-07-25 ENCOUNTER — Ambulatory Visit (INDEPENDENT_AMBULATORY_CARE_PROVIDER_SITE_OTHER): Admitting: Internal Medicine

## 2023-07-25 VITALS — BP 130/75 | HR 67 | Temp 98.4°F | Wt 180.0 lb

## 2023-07-25 DIAGNOSIS — H6121 Impacted cerumen, right ear: Secondary | ICD-10-CM

## 2023-07-25 DIAGNOSIS — G629 Polyneuropathy, unspecified: Secondary | ICD-10-CM

## 2023-07-25 DIAGNOSIS — E1129 Type 2 diabetes mellitus with other diabetic kidney complication: Secondary | ICD-10-CM

## 2023-07-25 DIAGNOSIS — Z794 Long term (current) use of insulin: Secondary | ICD-10-CM

## 2023-07-25 DIAGNOSIS — N1832 Chronic kidney disease, stage 3b: Secondary | ICD-10-CM

## 2023-07-25 DIAGNOSIS — R61 Generalized hyperhidrosis: Secondary | ICD-10-CM

## 2023-07-25 DIAGNOSIS — R809 Proteinuria, unspecified: Secondary | ICD-10-CM

## 2023-07-25 LAB — COMPREHENSIVE METABOLIC PANEL
ALT: 21 U/L (ref <=55)
AST (SGOT): 20 U/L (ref <=41)
Albumin/Globulin Ratio: 0.7 — ABNORMAL LOW (ref 0.9–2.2)
Albumin: 3.1 g/dL — ABNORMAL LOW (ref 3.5–5.0)
Alkaline Phosphatase: 143 U/L — ABNORMAL HIGH (ref 37–117)
Anion Gap: 10 (ref 5.0–15.0)
BUN: 26 mg/dL — ABNORMAL HIGH (ref 7–21)
Bilirubin, Total: 0.4 mg/dL (ref 0.2–1.2)
CO2: 29 meq/L (ref 17–29)
Calcium: 9 mg/dL (ref 8.5–10.5)
Chloride: 102 meq/L (ref 99–111)
Creatinine: 1 mg/dL (ref 0.4–1.0)
GFR: >60 mL/min/1.73 m2 (ref >=60.0)
Globulin: 4.6 g/dL — ABNORMAL HIGH (ref 2.0–3.6)
Glucose: 250 mg/dL — ABNORMAL HIGH (ref 70–100)
Hemolysis Index: 8 {index}
Potassium: 4.9 meq/L (ref 3.5–5.3)
Protein, Total: 7.7 g/dL (ref 6.0–8.3)
Sodium: 141 meq/L (ref 135–145)

## 2023-07-25 LAB — HEMOGLOBIN A1C
Average Estimated Glucose: 211.6 mg/dL
Hemoglobin A1C: 9 % — ABNORMAL HIGH (ref 4.6–5.6)

## 2023-07-25 LAB — LAB USE ONLY - CBC WITH DIFFERENTIAL
Absolute Basophils: 0.06 x10 3/uL (ref 0.00–0.08)
Absolute Eosinophils: 0.37 x10 3/uL (ref 0.00–0.44)
Absolute Immature Granulocytes: 0.05 x10 3/uL (ref 0.00–0.07)
Absolute Lymphocytes: 1.64 x10 3/uL (ref 0.42–3.22)
Absolute Monocytes: 0.82 x10 3/uL (ref 0.21–0.85)
Absolute Neutrophils: 5.14 x10 3/uL (ref 1.10–6.33)
Absolute nRBC: 0 x10 3/uL (ref <=0.00)
Basophils %: 0.7 %
Eosinophils %: 4.6 %
Hematocrit: 46.8 % — ABNORMAL HIGH (ref 34.7–43.7)
Hemoglobin: 14.5 g/dL (ref 11.4–14.8)
Immature Granulocytes %: 0.6 %
Lymphocytes %: 20.3 %
MCH: 29.1 pg (ref 25.1–33.5)
MCHC: 31 g/dL — ABNORMAL LOW (ref 31.5–35.8)
MCV: 94 fL (ref 78.0–96.0)
MPV: 11.7 fL (ref 8.9–12.5)
Monocytes %: 10.1 %
Neutrophils %: 63.7 %
Platelet Count: 298 x10 3/uL (ref 142–346)
Preliminary Absolute Neutrophil Count: 5.14 x10 3/uL (ref 1.10–6.33)
RBC: 4.98 x10 6/uL (ref 3.90–5.10)
RDW: 13 % (ref 11–15)
WBC: 8.08 x10 3/uL (ref 3.10–9.50)
nRBC %: 0 /100{WBCs} (ref <=0.0)

## 2023-07-25 LAB — T4, FREE: T4 Free: 1.18 ng/dL (ref 0.69–1.48)

## 2023-07-25 LAB — TSH: TSH: 0.7 u[IU]/mL (ref 0.35–4.94)

## 2023-07-25 NOTE — Assessment & Plan Note (Addendum)
 Orders:    Comprehensive Metabolic Panel; Future

## 2023-07-25 NOTE — Progress Notes (Signed)
 [facility]   OFFICE VISIT                HPI     Chief Complaint   Patient presents with    Cold    Night Sweats    Hand tingling       HPI    68 y/o female PMH HTN, DM, HFpEF, CKD3b, Afib on Pradaxa , Uterine Prolapse here for above    Spanish interpreter used    8 days symptoms of  Night sweats and feeling cold  No fever  SOB and slight cough  Denies congestion, runny nose, sore thraot  No sick contact    Hands are numb and painful  Burning feet on the right calf  She has uncontrolled DM  She is on Insulin   She did not make the changes I recommended on the last visit and she says she sometimes sees BS as high as in the 300's but very infrequent;     Right ear Pain   Onset chronic        ROS   Review of Systems   Constitutional:  Positive for chills and diaphoresis. Negative for activity change and fever.   HENT:  Positive for ear pain. Negative for congestion, ear discharge and sore throat.    Respiratory:  Positive for cough and shortness of breath. Negative for chest tightness.    Cardiovascular:  Negative for chest pain.   Gastrointestinal:  Negative for abdominal pain.   Neurological:  Negative for light-headedness and headaches.       Vital Signs   BP 130/75 (BP Site: Left arm, Patient Position: Sitting, Cuff Size: Small)   Pulse 67   Temp 98.4 F (36.9 C)   Wt 81.6 kg (180 lb)   SpO2 96%   BMI 29.81 kg/m     Physical Exam   Physical Exam  Constitutional:       General: She is not in acute distress.     Appearance: She is not ill-appearing.   HENT:      Right Ear: There is impacted cerumen.   Cardiovascular:      Rate and Rhythm: Normal rate.      Heart sounds: No murmur heard.  Pulmonary:      Effort: Pulmonary effort is normal.      Breath sounds: Normal breath sounds. No wheezing.   Abdominal:      General: Abdomen is flat.      Palpations: Abdomen is soft.      Tenderness: There is no abdominal tenderness.   Skin:     General: Skin is warm and dry.   Neurological:      General: No focal  deficit present.      Mental Status: She is alert and oriented to person, place, and time.   Psychiatric:         Mood and Affect: Mood normal.         Behavior: Behavior normal.         Assessment/Plan     Assessment & Plan  Night sweats  Have considered infeciton including viral syndrome; vitals are stable  No indication for testig for flu or covid as she is outside of window for treamtent; did encourage rest and hydration  Will also test for thyroid  disease due to family hx    Orders:    TSH; Future    T4, Free; Future    CBC with Differential (Order); Future    Type 2  diabetes mellitus with microalbuminuria, with long-term current use of insulin  (CMS/HCC)    Orders:    Hemoglobin A1C; Future    Stage 3b chronic kidney disease (CMS/HCC)    Orders:    Comprehensive Metabolic Panel; Future    Neuropathy  Likley due to uncontrolled DM  We discussed about making the changes with her insulin  dosing as discussed on the last office visit and to follow up in 4 weeks       Impacted cerumen of right ear    Orders:    Ear wax removal

## 2023-07-26 ENCOUNTER — Ambulatory Visit (INDEPENDENT_AMBULATORY_CARE_PROVIDER_SITE_OTHER): Payer: Self-pay | Admitting: Internal Medicine

## 2023-09-19 ENCOUNTER — Ambulatory Visit (INDEPENDENT_AMBULATORY_CARE_PROVIDER_SITE_OTHER): Admitting: Internal Medicine

## 2023-09-21 ENCOUNTER — Encounter (INDEPENDENT_AMBULATORY_CARE_PROVIDER_SITE_OTHER): Payer: Self-pay | Admitting: Internal Medicine

## 2023-10-06 ENCOUNTER — Encounter (INDEPENDENT_AMBULATORY_CARE_PROVIDER_SITE_OTHER): Payer: Self-pay | Admitting: Internal Medicine

## 2023-10-17 ENCOUNTER — Other Ambulatory Visit (INDEPENDENT_AMBULATORY_CARE_PROVIDER_SITE_OTHER)

## 2023-10-17 ENCOUNTER — Other Ambulatory Visit (FREE_STANDING_LABORATORY_FACILITY): Payer: Self-pay

## 2023-10-17 ENCOUNTER — Encounter (INDEPENDENT_AMBULATORY_CARE_PROVIDER_SITE_OTHER): Payer: Self-pay

## 2023-10-17 DIAGNOSIS — N9489 Other specified conditions associated with female genital organs and menstrual cycle: Secondary | ICD-10-CM

## 2023-10-17 DIAGNOSIS — I871 Compression of vein: Secondary | ICD-10-CM

## 2023-10-17 LAB — BASIC METABOLIC PANEL
Anion Gap: 9 (ref 5.0–15.0)
BUN: 33 mg/dL — ABNORMAL HIGH (ref 7–21)
CO2: 27 meq/L (ref 17–29)
Calcium: 9.1 mg/dL (ref 8.5–10.5)
Chloride: 108 meq/L (ref 99–111)
Creatinine: 1.2 mg/dL — ABNORMAL HIGH (ref 0.4–1.0)
GFR: 48.7 mL/min/1.73 m2 — ABNORMAL LOW (ref >=60.0)
Glucose: 114 mg/dL — ABNORMAL HIGH (ref 70–100)
Hemolysis Index: 9 {index}
Potassium: 4.6 meq/L (ref 3.5–5.3)
Sodium: 144 meq/L (ref 135–145)

## 2023-10-17 LAB — CBC
Absolute nRBC: 0 x10 3/uL (ref <=0.00)
Hematocrit: 45.5 % — ABNORMAL HIGH (ref 34.7–43.7)
Hemoglobin: 14.9 g/dL — ABNORMAL HIGH (ref 11.4–14.8)
MCH: 29.9 pg (ref 25.1–33.5)
MCHC: 32.7 g/dL (ref 31.5–35.8)
MCV: 91.2 fL (ref 78.0–96.0)
MPV: 12.8 fL — ABNORMAL HIGH (ref 8.9–12.5)
Platelet Count: 237 x10 3/uL (ref 142–346)
RBC: 4.99 x10 6/uL (ref 3.90–5.10)
RDW: 14 % (ref 11–15)
WBC: 8.56 x10 3/uL (ref 3.10–9.50)
nRBC %: 0 /100{WBCs} (ref <=0.0)

## 2023-10-17 LAB — PT/INR
INR: 1.3 — ABNORMAL HIGH (ref 0.9–1.1)
PT: 14.9 s — ABNORMAL HIGH (ref 10.1–12.9)

## 2023-11-03 ENCOUNTER — Encounter (INDEPENDENT_AMBULATORY_CARE_PROVIDER_SITE_OTHER): Payer: Self-pay | Admitting: Internal Medicine

## 2023-11-03 ENCOUNTER — Ambulatory Visit (INDEPENDENT_AMBULATORY_CARE_PROVIDER_SITE_OTHER): Admitting: Internal Medicine

## 2023-11-03 VITALS — BP 130/77 | HR 73 | Temp 98.7°F | Ht 65.0 in | Wt 177.0 lb

## 2023-11-03 DIAGNOSIS — I10 Essential (primary) hypertension: Secondary | ICD-10-CM

## 2023-11-03 DIAGNOSIS — R809 Proteinuria, unspecified: Secondary | ICD-10-CM

## 2023-11-03 DIAGNOSIS — L97919 Non-pressure chronic ulcer of unspecified part of right lower leg with unspecified severity: Secondary | ICD-10-CM

## 2023-11-03 DIAGNOSIS — E1129 Type 2 diabetes mellitus with other diabetic kidney complication: Secondary | ICD-10-CM

## 2023-11-03 DIAGNOSIS — I83019 Varicose veins of right lower extremity with ulcer of unspecified site: Secondary | ICD-10-CM

## 2023-11-03 DIAGNOSIS — N1832 Chronic kidney disease, stage 3b: Secondary | ICD-10-CM

## 2023-11-03 DIAGNOSIS — E7800 Pure hypercholesterolemia, unspecified: Secondary | ICD-10-CM

## 2023-11-03 DIAGNOSIS — Z794 Long term (current) use of insulin: Secondary | ICD-10-CM

## 2023-11-03 DIAGNOSIS — R051 Acute cough: Secondary | ICD-10-CM

## 2023-11-03 LAB — IHS AMB POCT SOFIA (TM) COVID-19 & FLU A/B
Sofia Influenza A Ag POCT: NEGATIVE
Sofia Influenza B Ag POCT: NEGATIVE
Sofia SARS COV2 Antigen POCT: NEGATIVE

## 2023-11-03 NOTE — Assessment & Plan Note (Addendum)
 Cont Atrovastatin  Orders:    Comprehensive Metabolic Panel; Future    Lipid Panel; Future

## 2023-11-03 NOTE — Progress Notes (Signed)
 [facility]   OFFICE VISIT                HPI     Chief Complaint   Patient presents with    Diabetes     Follow up     Cough     For 2 days      HPI    68 y/o female PMH HTN, DM, HFpEF, CKD3b, Afib on Pradaxa , Uterine Prolapse here for above     Spanish interpreter used    Cough started 2 days ago  Feels chills, no fever  Has not gotten covid or flu vaccine yet  No sick contact    She also has ulcer on her right lower leg after a procedure at a vein clinic for varicose vein; she says there was a lot of fluid discharge initially; no she says the ulcer is becoming bigger; she wears compression stocking    T2DM   Most recent A1c 9.0%   Chesks BS twice a day  Lately she feels her BS is much better controlled  Am 100's  PM 130-140  jardiance  25 mg daily  Currently on NPH 30 U in AM and night time 35U  On Ace-I and Statin  Up to date with eye exam- she says she has upcoming surgery     HLD   On lipitor tolerating well     HFpEF  On Lisinopril  40 mg daily  Amlodipine  5 mg daily  On Bumex  alternating 1mg  daily  On Coreg  6.25 BID   She is back on NIfedipine   Had hyperkalemia with spironolactone  Echo 09/04/2022 showed LVEF of 60-65% with indeterminate diastolic function, moderate tricuspid regurgitation   She says Dr Katrine placed her back on Nifedipine  ER 30 mg however she had stop due to low BP readings     AF   No longer on Eliquis  due to price  Dabigatran  150 mg BID     HTN ,with white coat syndrome  On Lisinopril  40 qD  Carvedilol  12.5 mg BID  BP at home usually 120-130's  Nifedipine  30 mg daily restarted however pt says      ROS   Review of Systems   Constitutional:  Positive for chills. Negative for activity change, appetite change and fever.   Eyes:  Negative for visual disturbance.   Respiratory:  Positive for cough. Negative for chest tightness, shortness of breath and wheezing.    Cardiovascular:  Negative for chest pain, palpitations and leg swelling.   Gastrointestinal:  Negative for abdominal pain,  constipation, diarrhea, nausea and vomiting.   Endocrine: Negative for polydipsia, polyphagia and polyuria.   Genitourinary:  Negative for dysuria.   Musculoskeletal:  Negative for back pain, gait problem and myalgias.   Skin:  Positive for wound. Negative for rash.   Neurological:  Negative for dizziness.   Hematological:  Negative for adenopathy.   Psychiatric/Behavioral:  Negative for agitation.        Vital Signs   BP 130/77 (BP Site: Right arm, Patient Position: Sitting, Cuff Size: Large)   Pulse 73   Temp 98.7 F (37.1 C) (Oral)   Ht 1.651 m (5' 5)   Wt 80.3 kg (177 lb)   SpO2 97%   BMI 29.45 kg/m   Physical Exam   Physical Exam  Constitutional:       General: She is not in acute distress.     Appearance: Normal appearance. She is obese. She is not ill-appearing or toxic-appearing.  HENT:      Head: Normocephalic and atraumatic.   Cardiovascular:      Rate and Rhythm: Normal rate and regular rhythm.      Heart sounds: Normal heart sounds. No murmur heard.     No gallop.   Pulmonary:      Effort: Pulmonary effort is normal. No respiratory distress.      Breath sounds: Normal breath sounds. No stridor. No wheezing.   Abdominal:      General: Abdomen is flat. Bowel sounds are normal. There is no distension.      Palpations: Abdomen is soft.   Musculoskeletal:         General: Normal range of motion.   Skin:     General: Skin is warm and dry.      Capillary Refill: Capillary refill takes less than 2 seconds.      Comments: Ulcerated wound with surround redness; there clear discharge noted on gauze pad   Neurological:      General: No focal deficit present.      Mental Status: She is alert and oriented to person, place, and time. Mental status is at baseline.   Psychiatric:         Mood and Affect: Mood normal.         Behavior: Behavior normal.         Thought Content: Thought content normal.        Assessment/Plan     Assessment & Plan  Acute cough  Negative Flu and covid   Likley viral URI; we  discussed conservative treatment  Orders:    [facility]ofia(TM) SARS COVID19 & Flu A/B POCT; Future    SARS-CoV-2 (COVID-19) RNA, PCR; Future    Ulcerated leg varices, right (CMS/HCC)    Orders:    Referral to Wound Clinic ([facility]); Future    Type 2 diabetes mellitus with microalbuminuria, with long-term current use of insulin  (CMS/HCC)  Cont Jardiance , insulin   Orders:    Hemoglobin A1C; Future    Comprehensive Metabolic Panel; Future    Stage 3b chronic kidney disease (CMS/HCC)    Orders:    Comprehensive Metabolic Panel; Future    Pure hypercholesterolemia  Cont Atrovastatin  Orders:    Comprehensive Metabolic Panel; Future    Lipid Panel; Future    Primary hypertension  Cont Lisinopril  and Amlodipine , carvedilol   Orders:    Comprehensive Metabolic Panel; Future

## 2023-11-03 NOTE — Assessment & Plan Note (Addendum)
 Cont Lisinopril  and Amlodipine , carvedilol   Orders:    Comprehensive Metabolic Panel; Future

## 2023-11-03 NOTE — Assessment & Plan Note (Signed)
 Orders:    Comprehensive Metabolic Panel; Future

## 2023-11-04 LAB — SARS-COV-2 (COVID-19) RNA, PCR: SARS-CoV-2 (COVID-19) RNA: NOT DETECTED

## 2023-11-07 ENCOUNTER — Ambulatory Visit (INDEPENDENT_AMBULATORY_CARE_PROVIDER_SITE_OTHER)

## 2023-11-07 ENCOUNTER — Other Ambulatory Visit

## 2023-11-08 ENCOUNTER — Other Ambulatory Visit (INDEPENDENT_AMBULATORY_CARE_PROVIDER_SITE_OTHER): Payer: Self-pay | Admitting: Internal Medicine

## 2023-11-08 DIAGNOSIS — I10 Essential (primary) hypertension: Secondary | ICD-10-CM

## 2023-11-10 ENCOUNTER — Encounter (INDEPENDENT_AMBULATORY_CARE_PROVIDER_SITE_OTHER): Payer: Self-pay | Admitting: Internal Medicine

## 2023-11-10 ENCOUNTER — Other Ambulatory Visit

## 2023-11-10 ENCOUNTER — Ambulatory Visit (INDEPENDENT_AMBULATORY_CARE_PROVIDER_SITE_OTHER)

## 2023-11-10 DIAGNOSIS — Z23 Encounter for immunization: Secondary | ICD-10-CM

## 2023-11-10 DIAGNOSIS — E7800 Pure hypercholesterolemia, unspecified: Secondary | ICD-10-CM

## 2023-11-10 DIAGNOSIS — Z794 Long term (current) use of insulin: Secondary | ICD-10-CM

## 2023-11-10 DIAGNOSIS — E1129 Type 2 diabetes mellitus with other diabetic kidney complication: Secondary | ICD-10-CM

## 2023-11-10 DIAGNOSIS — N1832 Chronic kidney disease, stage 3b: Secondary | ICD-10-CM

## 2023-11-10 DIAGNOSIS — I10 Essential (primary) hypertension: Secondary | ICD-10-CM

## 2023-11-10 DIAGNOSIS — R809 Proteinuria, unspecified: Secondary | ICD-10-CM

## 2023-11-10 LAB — LIPID PANEL
Cholesterol / HDL Ratio: 4.1 {index}
Cholesterol: 182 mg/dL (ref <=199)
HDL: 44 mg/dL (ref >=40)
LDL Calculated: 115 mg/dL — ABNORMAL HIGH (ref 0–99)
Triglycerides: 126 mg/dL (ref 34–149)
VLDL Calculated: 21 mg/dL (ref 10–40)

## 2023-11-10 LAB — COMPREHENSIVE METABOLIC PANEL
ALT: 19 U/L (ref <=55)
AST (SGOT): 21 U/L (ref <=41)
Albumin/Globulin Ratio: 1 (ref 0.9–2.2)
Albumin: 3.5 g/dL (ref 3.5–4.9)
Alkaline Phosphatase: 139 U/L — ABNORMAL HIGH (ref 37–117)
Anion Gap: 9 (ref 5.0–15.0)
BUN: 19 mg/dL (ref 7–21)
Bilirubin, Total: 0.6 mg/dL (ref 0.2–1.2)
CO2: 30 meq/L — ABNORMAL HIGH (ref 17–29)
Calcium: 9.2 mg/dL (ref 8.5–10.5)
Chloride: 105 meq/L (ref 99–111)
Creatinine: 1 mg/dL (ref 0.4–1.0)
GFR: >60 mL/min/1.73 m2 (ref >=60.0)
Globulin: 3.6 g/dL (ref 2.0–3.6)
Glucose: 78 mg/dL (ref 70–100)
Hemolysis Index: 3 {index}
Potassium: 4.4 meq/L (ref 3.5–5.3)
Protein, Total: 7.1 g/dL (ref 6.0–8.3)
Sodium: 144 meq/L (ref 135–145)

## 2023-11-10 LAB — HEMOGLOBIN A1C
Average Estimated Glucose: 194.4 mg/dL
Hemoglobin A1C: 8.4 % — ABNORMAL HIGH (ref 4.6–5.6)

## 2023-11-10 NOTE — Progress Notes (Signed)
 Patient came into office for Covid and Flu  Administration. Injection received Left and Right  Deltoid. Patient left in good condition, no reaction.

## 2023-11-14 ENCOUNTER — Ambulatory Visit: Attending: Internal Medicine | Admitting: Foot & Ankle Surgery

## 2023-11-14 VITALS — Ht 65.0 in | Wt 179.0 lb

## 2023-11-14 DIAGNOSIS — E1140 Type 2 diabetes mellitus with diabetic neuropathy, unspecified: Secondary | ICD-10-CM | POA: Insufficient documentation

## 2023-11-14 DIAGNOSIS — L97812 Non-pressure chronic ulcer of other part of right lower leg with fat layer exposed: Secondary | ICD-10-CM | POA: Insufficient documentation

## 2023-11-14 DIAGNOSIS — L97312 Non-pressure chronic ulcer of right ankle with fat layer exposed: Secondary | ICD-10-CM | POA: Insufficient documentation

## 2023-11-14 DIAGNOSIS — I872 Venous insufficiency (chronic) (peripheral): Secondary | ICD-10-CM | POA: Insufficient documentation

## 2023-11-14 DIAGNOSIS — I83019 Varicose veins of right lower extremity with ulcer of unspecified site: Secondary | ICD-10-CM

## 2023-11-14 DIAGNOSIS — I83013 Varicose veins of right lower extremity with ulcer of ankle: Secondary | ICD-10-CM

## 2023-11-14 DIAGNOSIS — Z7984 Long term (current) use of oral hypoglycemic drugs: Secondary | ICD-10-CM | POA: Insufficient documentation

## 2023-11-14 DIAGNOSIS — R600 Localized edema: Secondary | ICD-10-CM | POA: Insufficient documentation

## 2023-11-14 DIAGNOSIS — E11622 Type 2 diabetes mellitus with other skin ulcer: Secondary | ICD-10-CM | POA: Insufficient documentation

## 2023-11-14 DIAGNOSIS — Z794 Long term (current) use of insulin: Secondary | ICD-10-CM | POA: Insufficient documentation

## 2023-11-14 NOTE — Progress Notes (Signed)
 [facility]  [facility]:  [address], [place], [place] [address]  T [phone]  F [phone]  Initial Wound Evaluation - Nursing Documentation    PATIENT: [name] DOB: 09-03-1955   MR #: [id number]  AGE: 68 y.o.    REFERRING MD:  Jacob Harlon GRADE, MD PRIMARY MD: Jacob Harlon GRADE, MD      Chief Complaint   Patient presents with    Wound Check    Edema       History of Presenting Illness   [name] is a 68 y.o. female who initially presents on 11/14/23 with Right Medial Ankle VLU.   Prior treatments include: antiseptic spray and dry dressing.  Patient referred by Dr. Jacob.    Patient reports Never smoker.    PMH: HTN, HLD, venous insufficiency, CAD, a-fib, DM Type 2, CKD stage 3b, glaucoma.    Neurovascular Exam Date of assessment 11/14/23.  Semmes-Weinstein: LOPS.  Pulses: R: DP & PT: biphasic; L: DP & PT: triphasic.    Current providers involved in patient's care:  PCP: Lige PATRIC Jacob, MD.  Recent A1C: 8.4.  Pt reports BG today: 103.  ABI: ABI deferred in-clinic due to location of wound coincides with cuff location and would cause Pt pain.    Assessment & Plan     Case Management:  Patient identification verified using two identifiers.  This patient has been determined to be at risk for a fall, extra precautionary measures have been taken to ensure their safety.    A review of the H&P and other pertinent information was done.    RN reconciled meds and allergies.  Patient arrived ambulating with a cane and accompanied by her daughter, who helps as historian.    Patient works as a Copy at [employer] and reports that she has noticed a lot of drainage from the ulcer, but it has improved reduced as of late. She also states that she never has pain from the ulcer.    Treatment:  R Medial Ankle: zinc to peri-ulcer, moisturizer to surrounding intact skin.   Aquacel AG, 4x4. ABD to bolster anterior ankle. Coban 2.    RTC: 1  week.    Procedures:  Additional Wound Procedures Lower Leg Right;Lower    Performed by: Celinda Arabia, RN  Authorized by: Jerona Rosina SAILOR, DPM  Associated wounds:   Wound 11/14/23 Other (Comment) Edema Management Lower Leg Right;Lower    Body area:  Lower extremity  Lower extremity location:  R lower leg    Compression wrap type: multi-layer    Wrap location: right    Wrap location equal debrided location?: yes    Debrided location: right    Multi-layer: 2 layer     Procedure was performed in a clean field. The leg was washed with soap and water and then patted dry. Moisturizer was applied to the leg. A multi-layer compression bandage was applied per manufacturer's instructions. Patient tolerated procedure well and without complaints of pain or discomfort. Patient was educated regarding signs and symptoms of overcompression, including unrelieved pain, toes turning red or purple, or numbness in foot.  Patient was instructed to manually remove outer layer (without scissors) and contact clinic immediately.       Patient Education:  - Plan of care and wound dressings.  - Elevate legs for 30 mins, 3x/day, above the level of the heart. Above the level of the heart, is key here. The easiest way to achieve  this is to lay down flat and prop your legs up with at least 2 pillows.  - Nutrition: with patients with chronic or slow-healing wounds, we recommend protein. Take your bodyweight, divide by 2 = daily protein goal, in grams.   - Provided Pt/CG with printed handouts: nutrition, compression wrap care, cast cover, and leg elevation.  - If wraps get wet/damaged, or if they're too tight, or if s/he has any questions: please call the [facility]. We are open M-F, 8:30-4:30, closed on weekends. If patient needs care during our off hours, please don't hesitate and go to Urgent Care or Emergency Room. Provided Pt with a printed handout regarding compressing wraps, and to never use scissors to remove wraps if too tight.   -  Patient and/or Caregiver verbalized understanding.     Dwayne Castor, RN   11/14/2023

## 2023-11-14 NOTE — Progress Notes (Signed)
 [facility]    Provider Documentation    The patient was seen in the office today and a initial evaluation was performed, including a history & focused exam.     History of Present Illness Initial Visit   [name] is a 68 y.o. 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  Has known history of venous insufficiency and has had history of multiple venous procedures done.  Has been seen at a vein clinic outside of the [facility].  According to the patient, ulcer has been present for the past 3 months.  Was referred to us  by her primary care provider, Dr. Jacob.     Previous treatments include applying bacitracin to the wound site, applying it 3 times a day and covering it with a bandage.  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  Her daughter who is accompanying her during today's visit states that she does not do much other than walk around, stating that her partner that works with her does more of the heavy workload.    Past Medical / Surgical / Family / Social History:     Medical History[1]  Past Surgical History[2]   Family History[3]  Social History[4]    Allergies and Home Medications:     Allergies[5]  Current Medications[6]    Physical Exam   There were no vitals filed for this visit.    Body mass index is 29.79 kg/m.    General Appearance - alert, well appearing and in no distress  Mental Status - alert, oriented to person, place and time  Vascular - See wound care exam  Skin - see wound care exam    NV Exam Obtained on 11/14/23  Right DP Pulse : Heard via doppler: Biphasic  Right PT Pulse : Heard via doppler: Triphasic  Left DP Pulse : Heard via doppler: Biphasic  Left PT Pulse : Heard via doppler: Triphasic  Left foot protective sensation impaired  Right foot protective sensation impaired    Focused Wound Care Exam    Wound Assessment:   Wound 11/14/23 Other (Comment) Edema Management Lower Leg Right;Lower (Active)   Date First Assessed/Time First Assessed: 11/14/23 1503   Primary Wound Type: Other (Comment)  Secondary Wound Type - Other: Edema Management  Location: Lower Leg  Wound Location Orientation: Right;Lower  Present on Original Admission: Yes      Assessments 11/14/2023  3:18 PM   Wound Image         Wound 11/14/23 Vascular Ulcer Lower Leg Distal;Right;Medial (Active)   Date First Assessed/Time First Assessed: 11/14/23 1503   Primary Wound Type: Vascular Ulcer  Location: Lower Leg  Wound Location Orientation: Distal;Right;Medial  Present on Original Admission: Yes      Assessments 11/14/2023  3:18 PM   Wound Image     Wound Base Description Fibrin/slough;Full Thickness;Moist;Red;Yellow;Slick   Peri-wound Description Erythema;Pink   Wound Length (cm) 2.1 cm   Wound Width (cm) 1.4 cm   Wound Depth (cm) 0.2 cm   Wound Surface Area (cm^2) 2.31 cm^2   Wound Volume (cm^3) 0.308 cm^3   Closure Open   Drainage Amount Scant   Drainage Description Serosanguinous   Tunneling No   Undermining  No   Margins Defined edges   Treatments Cleansed   Topical Zinc - oxide paste   Dressing Alginate Ag;Gauze;Abdominal  Dressing       Edema  RLE Edema: Pitting; 1+ barely detectable  R Foot Measurements(cm): 22 cm  R Ankle Measurements(cm): 21 cm  R Calf Measurements(cm): 37 cm  RLE Treatment: RLE Multilayer  RLE Multilayer Tx: Coban 2                       Semmes Weinstein  RLE: Protective sensation impaired  LLE: Protective sensation impaired    Labs and Imaging Reviewed   I have reviewed pertinent labs, imaging and cultures.    Assessment & Plan   I examined the patient for an wound(s) that has been resistant to healing despite numerous interventions. The plan is to now examine any underlying characteristics that may impede healing. I decided that the wounds are not self limited and significantly impact the patient's daily living.    Wound  Assessment and Plan:  The wounds are assessed as followed: this is the initial visit.  There were no signs of acute clinical or systemic infection, including erythema, warmth, induration, fluctuance, tenderness to palpation, purulence or malodor.  Procedures, if indicated based on today's evaluation of the patient and ulcer/wound characteristics, are detailed below.    Wound Care Plan:  Advanced Wound dressings appropriate to the needs of the wound have been initiated to provide a microenvironment which can enhance healing including maintaining a moist environment, decreasing bioburden and insulating and protecting the wound.   The treatment plan will change  as stated in nursing notes.   An antimicrobial dressing was applied as the primary dressing for control of possible excess bioburden : silver alginate    Edema:    I reviewed the circumference measurements. The edema is stable and the management plan will change.  The following edema recommendations have been made: Patient instructed to elevate the lower extremities above the level of the heart when possible.   Active compression with the use of Coban 2.     Vascular Status:   The patient's vascular status was assessed and is adequate for healing.     Nutrition:    Nutrition status is inadequate.  I made suggestions regarding appropriate protein intake, vitamin supplementation, and adequate hydration. Recommendations include Juven- Arginaid amino acid supplementation, high protein shake , healthy diet, and optimized control of blood sugar as elevated A1c impairs wound healing    Other comorbidities that may affect wound healing: Non-healing wounds are a symptom of their significant comorbid diseases. Diabetes, neuropathy, Venous insufficiency, and edema    Coordination of Care: I recommended ongoing care with PCP Dr. Jacob    Patient Education:  The patient was educated on: Dressing management, optimization of nutrition, signs and symptoms of infection , call  the clinic with any concerns, and strict ER precautions   Patient and daughter verbalized understanding and agreed with POC.   Home health was not indicated at this time..    The patient will continue to be seen on a regular basis.  Follow up in clinic in 1 week and PRN interval nurse visits for dressing changes.    The patient's condition required a significant, identifiable E/M service above and beyond the other service provided or services beyond the usual preoperative and postoperative care associated with the procedure that was performed.     Procedure Note   DEBRIDEMENT Lower Leg Distal;Right;Medial    Performed by: Jerona Rosina SAILOR, DPM  Authorized by: Jerona Rosina SAILOR, DPM  Associated wounds:   Wound 11/14/23 Vascular  Ulcer Lower Leg Distal;Right;Medial      Consent:     Consent obtained:  Written    Consent given by:  Patient    Risks discussed: Yes      Time out: Immediately prior to the procedure a time out was called    Debridement Details:     Performed by:  Physician    Type: excisional      Level: subcutaneous tissue      Pain control:  Lidocaine  2%    Pain control administration: topical        Time taken:  11/14/2023 3:18 PM  Pre-debridement measurements    Length (cm):  2.1    Width (cm):  1.4    Depth (cm):  0.2    Surface Area (cm^2):  2.31    Time taken:  11/14/2023 3:19 PM    Post-debridement measurements    Length (cm):  2.2    Width (cm):  1.5    Depth (cm):  0.3    Percent Debrided (%):  100    Surface Area (cm^2):  2.59    Area Debrided (cm^2):  2.59    Volume (cm^3):  0.52      Tissue and other material debrided: subcutaneous tissue      Devitalized tissue debrided: biofilm, fibrin, necrotic debris and slough      Instrument:  Curette    Amount of bleeding: small    Specimen sent for culture:   Specimen sent for culture comment:  No  Specimen sent for pathology:   Specimen sent for pathology comment:  No  Culture obtained:  no  Culture obtained comment:  Culture not obtained.    Hemostasis  obtained with:  Pressure    Response to treatment:  Procedure was tolerated well   The wound bed is comprised of 25-50% beefy red granulation tissue and 25-50% slough/fibrin non-viable tissue.      I decided to perform debridement today because, based on the patient's current status, there is an expectation that serial debridement will improve healing potential, reduce or control tissue infection, and prepare the tissue for surgical management. Surgical debridement is required to excise a specific, targeted area of devitalized or necrotic tissue along the margin of viable tissue by sharp dissection. I have reviewed the individualized treatment plan for this patient who requires a debridement. Debridement is needed as part of the plan to control heavy wound colonization. I previously assessed the patient's vascular status which is adequate. I have ensured that this ulcer has been adequately off-loaded. I am continuing to address nutritional issues, monitoring weights, encouraging the use of supplements and drawing laboratory tests when appropriate.          [1]   Past Medical History:  Diagnosis Date    Congenital anomaly of heart     Diabetes mellitus type 2 in obese     Diabetic retinopathy (CMS/HCC) 06/28/2013    Of the right eye, per patient    Glaucoma 06/01/2012    Hyperlipidemia     Hypertension     Obesity     Venous insufficiency 09/17/2014    Followed at [facility].  Wears compression stockings.    [2]   Past Surgical History:  Procedure Laterality Date    BREAST CYST EXCISION  1993    bilateral    TUBAL LIGATION     [3]   Family History  Problem Relation Name Age of Onset    Heart disease Mother  Diabetes Mother      Hypertension Mother      Hyperlipidemia Mother      Heart attack Mother      Heart disease Father      Diabetes Father      Hypertension Father      Hyperlipidemia Father      Cirrhosis Father      Diabetes Sister      Liver cancer Maternal Aunt      Cancer Maternal Uncle           liver   [4]   Social History  Tobacco Use    Smoking status: Never     Passive exposure: Never    Smokeless tobacco: Never   Vaping Use    Vaping status: Never Used   Substance Use Topics    Alcohol use: Never    Drug use: No   [5]   Allergies  Allergen Reactions    Shellfish-Derived Products    [6]   Current Outpatient Medications:     amLODIPine  (NORVASC ) 5 MG tablet, Take 1 tablet (5 mg) by mouth once daily, Disp: , Rfl:     atorvastatin  (LIPITOR) 40 MG tablet, Take 1 tablet (40 mg) by mouth every evening, Disp: 90 tablet, Rfl: 3    Blood Glucose Monitoring Suppl (ACCU-CHEK AVIVA PLUS) w/Device Kit, Check blood glucose level twice daily. ICD 10 Code E11.9, Z79.4, Disp: 1 kit, Rfl: 1    bumetanide  (BUMEX ) 2 MG tablet, Take 1 tablet (2 mg) by mouth Once every Monday, Wednesday and Friday morning, Disp: , Rfl:     carvedilol  (COREG ) 12.5 MG tablet, Take 0.5 tablets (6.25 mg) by mouth 2 (two) times daily with meals, Disp: , Rfl:     dabigatran  (PRADAXA ) 150 MG Cap, Take 1 capsule (150 mg) by mouth every 12 (twelve) hours, Disp: 180 capsule, Rfl: 1    empagliflozin  (Jardiance ) 25 MG tablet, Take 1 tablet (25 mg) by mouth every morning, Disp: 90 tablet, Rfl: 3    glucose blood (Accu-Chek Aviva) test strip, Check blood glucose level twice daily. ICD 10 Code E11.9, Z79.4, Disp: 200 each, Rfl: 2    insulin  NPH (NovoLIN  N) 100 UNIT/ML injection, INJECT 30 UNITS SUBCUTANEOUSLY WITH BREAKFST AND 35  UNITS WITH DINNERI, Disp: 60 mL, Rfl: 3    Insulin  Syringe-Needle U-100 (BD INSULIN  SYRINGE ULTRAFINE) 31G X 5/16 1 ML Misc, ADMINSTER INSULIN  UNDER THE SKIN 2 TIMES A DAY, Disp: 300 each, Rfl: 2    lisinopril  (ZESTRIL ) 40 MG tablet, TAKE 1 TABLET BY MOUTH DAILY, Disp: 90 tablet, Rfl: 0    timolol  (TIMOPTIC ) 0.5 % ophthalmic solution, Place 1 drop into both eyes daily, Disp: , Rfl:     carbamide peroxide (DEBROX) 6.5 % otic solution, Place 5 drops into the right ear 2 (two) times daily (Patient not taking: Reported on  11/14/2023), Disp: 15 mL, Rfl: 2

## 2023-11-15 ENCOUNTER — Ambulatory Visit (INDEPENDENT_AMBULATORY_CARE_PROVIDER_SITE_OTHER): Payer: Self-pay | Admitting: Internal Medicine

## 2023-11-21 ENCOUNTER — Ambulatory Visit: Attending: Internal Medicine | Admitting: Foot & Ankle Surgery

## 2023-11-21 VITALS — BP 132/73 | HR 74 | Temp 97.5°F

## 2023-11-21 DIAGNOSIS — L97312 Non-pressure chronic ulcer of right ankle with fat layer exposed: Secondary | ICD-10-CM

## 2023-11-21 DIAGNOSIS — R600 Localized edema: Secondary | ICD-10-CM

## 2023-11-21 DIAGNOSIS — I10 Essential (primary) hypertension: Secondary | ICD-10-CM | POA: Insufficient documentation

## 2023-11-21 DIAGNOSIS — L97511 Non-pressure chronic ulcer of other part of right foot limited to breakdown of skin: Secondary | ICD-10-CM | POA: Insufficient documentation

## 2023-11-21 DIAGNOSIS — I872 Venous insufficiency (chronic) (peripheral): Secondary | ICD-10-CM | POA: Insufficient documentation

## 2023-11-21 DIAGNOSIS — I83018 Varicose veins of right lower extremity with ulcer other part of lower leg: Secondary | ICD-10-CM | POA: Insufficient documentation

## 2023-11-21 DIAGNOSIS — Z7984 Long term (current) use of oral hypoglycemic drugs: Secondary | ICD-10-CM | POA: Insufficient documentation

## 2023-11-21 DIAGNOSIS — E11621 Type 2 diabetes mellitus with foot ulcer: Secondary | ICD-10-CM

## 2023-11-21 DIAGNOSIS — L97812 Non-pressure chronic ulcer of other part of right lower leg with fat layer exposed: Secondary | ICD-10-CM | POA: Insufficient documentation

## 2023-11-21 DIAGNOSIS — I83013 Varicose veins of right lower extremity with ulcer of ankle: Secondary | ICD-10-CM

## 2023-11-21 DIAGNOSIS — E1142 Type 2 diabetes mellitus with diabetic polyneuropathy: Secondary | ICD-10-CM | POA: Insufficient documentation

## 2023-11-21 NOTE — Addendum Note (Signed)
 Addended by: [place] Pelto N. on: 11/21/2023 04:51 PM     Modules accepted: Level of Service

## 2023-11-21 NOTE — Progress Notes (Signed)
 [facility]  [facility]:  [address] [place], [place] [address]  T [phone]  F [phone]  Initial Wound Evaluation - Nursing Documentation    PATIENT: [name] DOB: [birth year]   MR #: [id number]  AGE: 68 y.o.    REFERRING MD:  Jacob Harlon GRADE, MD PRIMARY MD: Jacob Harlon GRADE, MD      Chief Complaint   Patient presents with    Wound Check    Edema     Assessment & Plan   Patient arrived in the wound center accompanied by daughter. RN reconciled meds and allergies.    Case Management:  Patient identification verified using two identifiers.  This patient has been determined to be at risk for a fall, extra precautionary measures have been taken to ensure their safety.      Treatment:  R Medial Ankle: zinc to peri-ulcer, moisturizer to surrounding intact skin. Aquacel AG, 4x4. ABD to bolster anterior ankle. Coban 2.  R big toe dorsal: xeroform, kerlix. (Provider ordered closed toes but patient and Daughter refused)    RTC: 1 week.    Additional Wound Procedures    Performed by: Yasmine Kilbourne IV V, RN  Authorized by: Jerona Rosina SAILOR, DPM  Associated wounds:   Wound 11/14/23 Other (Comment) Edema Management Lower Leg Right;Lower  Wound 11/14/23 Vascular Ulcer Lower Leg Distal;Right;Medial  Wound 11/21/23 Toe D1, great Right;Dorsal    Body area:  Lower extremity  Lower extremity location:  R lower leg    Compression wrap type: multi-layer    Wrap location: right    Wrap location equal debrided location?: yes    Debrided location: right    Multi-layer: 2 layer     Procedure was performed in a clean field. The leg was washed with soap and water and then patted dry. Moisturizer was applied to the leg. A multi-layer compression bandage was applied per manufacturer's instructions. Patient tolerated procedure well and without complaints of pain or discomfort. Patient was educated regarding signs and symptoms of overcompression, including unrelieved pain, toes  turning red or purple, or numbness in foot.  Patient was instructed to manually remove outer layer (without scissors) and contact clinic immediately.       Patient Education:  - Plan of care and wound dressings.  - Elevate legs for 30 mins, 3x/day, above the level of the heart. Above the level of the heart, is key here. The easiest way to achieve this is to lay down flat and prop your legs up with at least 2 pillows.  - Nutrition: with patients with chronic or slow-healing wounds, we recommend protein. Take your bodyweight, divide by 2 = daily protein goal, in grams.   - Provided Pt/CG with printed handouts: nutrition, compression wrap care, cast cover, and leg elevation.  - If wraps get wet/damaged, or if they're too tight, or if s/he has any questions: please call the [facility]. We are open M-F, 8:30-4:30, closed on weekends. If patient needs care during our off hours, please don't hesitate and go to Urgent Care or Emergency Room. Provided Pt with a printed handout regarding compressing wraps, and to never use scissors to remove wraps if too tight.   - Patient and/or Caregiver verbalized understanding.     Juwon Scripter IV LULLA Ket, RN   11/21/2023

## 2023-11-21 NOTE — Progress Notes (Signed)
 [facility]    Provider Documentation    The patient was seen in the office today and a initial evaluation was performed, including a history & focused exam.     History of Present Illness Initial Visit   [name] is a 68 y.o. 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  Has known history of venous insufficiency and has had history of multiple venous procedures done.  Has been seen at a vein clinic outside of the [facility].  According to the patient, ulcer has been present for the past 3 months.  Was referred to us  by her primary care provider, Dr. Jacob.     Previous treatments include applying bacitracin to the wound site, applying it 3 times a day and covering it with a bandage.  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  Her daughter who is accompanying her during today's visit states that she does not do much other than walk around, stating that her partner that works with her does more of the heavy workload.    Pertinent Wound Care History   Has been able to elevate her legs during work hours.     Interval History 11/21/2023:    she reports: tolerating treatment well and denies constitutional symptoms. Took an Arboriculturist to file down her nails on the right foot. Now presents with a superficial wound to dorsal R hallux. Has been able to elevate her legs during work.     Past Medical / Surgical / Family / Social History:     Medical History[1]  Past Surgical History[2]   Family History[3]  Social History[4]    Allergies and Home Medications:     Allergies[5]  Current Medications[6]    Physical Exam     Vitals:    11/21/23 1514   BP: 132/73   Pulse: 74   Temp: 97.5 F (36.4 C)       There is no height or weight on file to calculate BMI.    General Appearance - alert, well appearing and in no distress  Mental Status -  alert, oriented to person, place and time  Vascular - See wound care exam  Skin - see wound care exam    NV Exam Obtained on 11/14/23  Right DP Pulse : Heard via doppler: Biphasic  Right PT Pulse : Heard via doppler: Triphasic  Left DP Pulse : Heard via doppler: Biphasic  Left PT Pulse : Heard via doppler: Triphasic  Left foot protective sensation impaired  Right foot protective sensation impaired    Focused Wound Care Exam   Wound Assessment:   Wound 11/14/23 Other (Comment) Edema Management Lower Leg Right;Lower (Active)   Date First Assessed/Time First Assessed: 11/14/23 1503   Primary Wound Type: Other (Comment)  Secondary Wound Type - Other: Edema Management  Location: Lower Leg  Wound Location Orientation: Right;Lower  Present on Original Admission: Yes      Assessments 11/21/2023  3:22 PM   Wound Image         Wound 11/14/23 Vascular Ulcer Lower Leg Distal;Right;Medial (Active)   Date First Assessed/Time First Assessed: 11/14/23 1503   Primary Wound Type: Vascular Ulcer  Location: Lower Leg  Wound Location Orientation: Distal;Right;Medial  Present on Original Admission: Yes      Assessments 11/21/2023  3:23 PM   Wound Image  Wound Base Description Full Thickness;Fibrin/slough;Satellite Lesions   Peri-wound Description Hyperpigmented   Wound Length (cm) 1.9 cm   Wound Width (cm) 1.2 cm   Wound Depth (cm) 0.1 cm   Wound Surface Area (cm^2) 1.79 cm^2   Wound Volume (cm^3) 0.119 cm^3   Closure Open   Drainage Amount Small   Drainage Description Serosanguinous   Margins Open;Defined edges   Treatments Cleansed;Compression Wrap1   Compression Wrap 1 Coban 2 or equivalent   Topical Moisturizing cream;Zinc - oxide paste   Dressing Change Freq 1x/week       Wound 11/21/23 Toe D1, great Right;Dorsal (Active)   Date First Assessed/Time First Assessed: 11/21/23 1522   Location: Toe D1, great  Wound Location Orientation: Right;Dorsal      Assessments 11/21/2023  3:24 PM   Wound Image     Wound Base Description Partial  Thickness   Peri-wound Description Dry   Wound Length (cm) 0.4 cm   Wound Width (cm) 0.5 cm   Wound Depth (cm) 0.1 cm   Wound Surface Area (cm^2) 0.16 cm^2   Wound Volume (cm^3) 0.01 cm^3   Closure Open   Drainage Amount None   Margins Defined edges   Treatments Cleansed       Edema  RLE Edema: Pitting; 2+ indentation < 5mm  R Foot Measurements(cm): 20.5 cm  R Ankle Measurements(cm): 21.5 cm  R Calf Measurements(cm): 35.5 cm  RLE Treatment: RLE Multilayer  RLE Multilayer Tx: Coban 2                            Labs and Imaging Reviewed   I have reviewed pertinent labs, imaging and cultures.    Assessment & Plan   I examined the patient for an wound(s) that has been resistant to healing despite numerous interventions. The plan is to now examine any underlying characteristics that may impede healing. I decided that the wounds are not self limited and significantly impact the patient's daily living.    Wound Assessment and Plan:  The wounds are assessed as followed: improving.  Presents with a superficial wound to the right hallux (NEW). No debridement indicated for this wound.   There were no signs of acute clinical or systemic infection, including erythema, warmth, induration, fluctuance, tenderness to palpation, purulence or malodor.  Procedures, if indicated based on today's evaluation of the patient and ulcer/wound characteristics, are detailed below.    Wound Care Plan:  Advanced Wound dressings appropriate to the needs of the wound have been initiated to provide a microenvironment which can enhance healing including maintaining a moist environment, decreasing bioburden and insulating and protecting the wound.   The treatment plan will remain the same  as stated in nursing notes.   An antimicrobial dressing was applied as the primary dressing for control of possible excess bioburden : silver alginate    Edema:    I reviewed the circumference measurements. The edema is stable and the management plan will change.   The following edema recommendations have been made: Patient instructed to elevate the lower extremities above the level of the heart when possible.   Active compression with the use of Coban 2.     Vascular Status:   The patient's vascular status was assessed and is adequate for healing.     Nutrition:    Nutrition status is inadequate.  I made suggestions regarding appropriate protein intake, vitamin supplementation, and adequate hydration. Recommendations include Juven- Arginaid  amino acid supplementation, high protein shake , healthy diet, and optimized control of blood sugar as elevated A1c impairs wound healing    Other comorbidities that may affect wound healing: Non-healing wounds are a symptom of their significant comorbid diseases. Diabetes, neuropathy, Venous insufficiency, and edema    Coordination of Care: I recommended ongoing care with PCP Dr. Jacob    Patient Education:  The patient was educated on: Dressing management, optimization of nutrition, signs and symptoms of infection , call the clinic with any concerns, and strict ER precautions   Patient and daughter verbalized understanding and agreed with POC.   Home health was not indicated at this time..    The patient will continue to be seen on a regular basis.  Follow up in clinic in 1 week and PRN interval nurse visits for dressing changes.    The patient's condition required a significant, identifiable E/M service above and beyond the other service provided or services beyond the usual preoperative and postoperative care associated with the procedure that was performed.     Procedure Note   DEBRIDEMENT Lower Leg Distal;Right;Medial    Performed by: Jerona Rosina SAILOR, DPM  Authorized by: Jerona Rosina SAILOR, DPM  Associated wounds:   Wound 11/14/23 Vascular Ulcer Lower Leg Distal;Right;Medial      Consent:     Consent obtained:  Written    Consent given by:  Patient    Risks discussed: Yes      Time out: Immediately prior to the procedure a time out  was called    Debridement Details:     Performed by:  Physician    Type: excisional      Level: subcutaneous tissue      Pain control:  Lidocaine  2%    Pain control administration: topical        Time taken:  11/21/2023 3:23 PM  Pre-debridement measurements    Length (cm):  1.9    Width (cm):  1.2    Depth (cm):  0.1    Surface Area (cm^2):  1.79    Time taken:  11/21/2023 3:24 PM    Post-debridement measurements    Length (cm):  2    Width (cm):  1.3    Depth (cm):  0.3    Percent Debrided (%):  100    Surface Area (cm^2):  2.04    Area Debrided (cm^2):  2.04    Volume (cm^3):  0.41    Devitalized tissue debrided: biofilm, fibrin, necrotic debris and slough      Instrument:  Curette    Amount of bleeding: small    Specimen sent for culture:   Specimen sent for culture comment:  No  Specimen sent for pathology:   Specimen sent for pathology comment:  No  Culture obtained:  no  Culture obtained comment:  Culture not obtained.    Hemostasis obtained with:  Pressure    Procedural pain:  0    Post-procedural pain:  2    Response to treatment:  Procedure was tolerated well   The wound bed is comprised of 25-50% beefy red granulation tissue and 25-50% slough/fibrin non-viable tissue.      I decided to perform debridement today because, based on the patient's current status, there is an expectation that serial debridement will improve healing potential, reduce or control tissue infection, and prepare the tissue for surgical management. Surgical debridement is required to excise a specific, targeted area of devitalized or necrotic tissue along the margin of  viable tissue by sharp dissection. I have reviewed the individualized treatment plan for this patient who requires a debridement. Debridement is needed as part of the plan to control heavy wound colonization. I previously assessed the patient's vascular status which is adequate. I have ensured that this ulcer has been adequately off-loaded. I am continuing to address  nutritional issues, monitoring weights, encouraging the use of supplements and drawing laboratory tests when appropriate.            [1]   Past Medical History:  Diagnosis Date    Congenital anomaly of heart     Diabetes mellitus type 2 in obese     Diabetic retinopathy (CMS/HCC) 06/28/2013    Of the right eye, per patient    Glaucoma 06/01/2012    Hyperlipidemia     Hypertension     Obesity     Venous insufficiency 09/17/2014    Followed at [facility].  Wears compression stockings.    [2]   Past Surgical History:  Procedure Laterality Date    BREAST CYST EXCISION  1993    bilateral    TUBAL LIGATION     [3]   Family History  Problem Relation Name Age of Onset    Heart disease Mother      Diabetes Mother      Hypertension Mother      Hyperlipidemia Mother      Heart attack Mother      Heart disease Father      Diabetes Father      Hypertension Father      Hyperlipidemia Father      Cirrhosis Father      Diabetes Sister      Liver cancer Maternal Aunt      Cancer Maternal Uncle          liver   [4]   Social History  Tobacco Use    Smoking status: Never     Passive exposure: Never    Smokeless tobacco: Never   Vaping Use    Vaping status: Never Used   Substance Use Topics    Alcohol use: Never    Drug use: No   [5]   Allergies  Allergen Reactions    Shellfish-Derived Products    [6]   Current Outpatient Medications:     amLODIPine  (NORVASC ) 5 MG tablet, Take 1 tablet (5 mg) by mouth once daily, Disp: , Rfl:     atorvastatin  (LIPITOR) 40 MG tablet, Take 1 tablet (40 mg) by mouth every evening, Disp: 90 tablet, Rfl: 3    Blood Glucose Monitoring Suppl (ACCU-CHEK AVIVA PLUS) w/Device Kit, Check blood glucose level twice daily. ICD 10 Code E11.9, Z79.4, Disp: 1 kit, Rfl: 1    bumetanide  (BUMEX ) 2 MG tablet, Take 1 tablet (2 mg) by mouth Once every Monday, Wednesday and Friday morning, Disp: , Rfl:     carbamide peroxide (DEBROX) 6.5 % otic solution, Place 5 drops into the right ear 2 (two) times daily,  Disp: 15 mL, Rfl: 2    carvedilol  (COREG ) 12.5 MG tablet, Take 0.5 tablets (6.25 mg) by mouth 2 (two) times daily with meals, Disp: , Rfl:     dabigatran  (PRADAXA ) 150 MG Cap, Take 1 capsule (150 mg) by mouth every 12 (twelve) hours, Disp: 180 capsule, Rfl: 1    empagliflozin  (Jardiance ) 25 MG tablet, Take 1 tablet (25 mg) by mouth every morning, Disp: 90 tablet, Rfl: 3    glucose blood (Accu-Chek Aviva)  test strip, Check blood glucose level twice daily. ICD 10 Code E11.9, Z79.4, Disp: 200 each, Rfl: 2    insulin  NPH (NovoLIN  N) 100 UNIT/ML injection, INJECT 30 UNITS SUBCUTANEOUSLY WITH BREAKFST AND 35  UNITS WITH DINNERI, Disp: 60 mL, Rfl: 3    Insulin  Syringe-Needle U-100 (BD INSULIN  SYRINGE ULTRAFINE) 31G X 5/16 1 ML Misc, ADMINSTER INSULIN  UNDER THE SKIN 2 TIMES A DAY, Disp: 300 each, Rfl: 2    lisinopril  (ZESTRIL ) 40 MG tablet, TAKE 1 TABLET BY MOUTH DAILY, Disp: 90 tablet, Rfl: 0    timolol  (TIMOPTIC ) 0.5 % ophthalmic solution, Place 1 drop into both eyes daily, Disp: , Rfl:

## 2023-11-28 ENCOUNTER — Ambulatory Visit: Attending: Foot & Ankle Surgery | Admitting: Foot & Ankle Surgery

## 2023-11-28 VITALS — BP 132/72 | HR 80 | Temp 97.7°F | Resp 18

## 2023-11-28 DIAGNOSIS — I872 Venous insufficiency (chronic) (peripheral): Secondary | ICD-10-CM | POA: Insufficient documentation

## 2023-11-28 DIAGNOSIS — L97812 Non-pressure chronic ulcer of other part of right lower leg with fat layer exposed: Secondary | ICD-10-CM | POA: Insufficient documentation

## 2023-11-28 DIAGNOSIS — I10 Essential (primary) hypertension: Secondary | ICD-10-CM | POA: Insufficient documentation

## 2023-11-28 DIAGNOSIS — I83018 Varicose veins of right lower extremity with ulcer other part of lower leg: Secondary | ICD-10-CM | POA: Insufficient documentation

## 2023-11-28 DIAGNOSIS — E1142 Type 2 diabetes mellitus with diabetic polyneuropathy: Secondary | ICD-10-CM | POA: Insufficient documentation

## 2023-11-28 DIAGNOSIS — L97512 Non-pressure chronic ulcer of other part of right foot with fat layer exposed: Secondary | ICD-10-CM | POA: Insufficient documentation

## 2023-11-28 DIAGNOSIS — R600 Localized edema: Secondary | ICD-10-CM

## 2023-11-28 DIAGNOSIS — I83013 Varicose veins of right lower extremity with ulcer of ankle: Secondary | ICD-10-CM

## 2023-11-28 DIAGNOSIS — L97312 Non-pressure chronic ulcer of right ankle with fat layer exposed: Secondary | ICD-10-CM

## 2023-11-28 DIAGNOSIS — Z7984 Long term (current) use of oral hypoglycemic drugs: Secondary | ICD-10-CM | POA: Insufficient documentation

## 2023-11-28 NOTE — Progress Notes (Signed)
 [facility]  [facility]:  [address] [place], [place] [address]  T [phone]  F [phone]  Initial Wound Evaluation - Nursing Documentation    PATIENT: [name] DOB: [birth year]   MR #: [id number]  AGE: 68 y.o.    REFERRING MD:  Jacob Harlon GRADE, MD PRIMARY MD: Jacob Harlon GRADE, MD      Chief Complaint   Patient presents with    Wound Check    Edema     Assessment & Plan   Patient arrived in the wound center accompanied by daughter. RN reconciled meds and allergies.    Case Management:  Patient identification verified using two identifiers.  This patient has been determined to be at risk for a fall, extra precautionary measures have been taken to ensure their safety.    Pt declined interpreter at this time.   BG as reported by pt: 100      Treatment:  R Medial Ankle: urgoclean,  ABD to bolster anterior ankle. Coban 2.  R big toe dorsal: xeroform, kerlix.     RTC: 1 week.      Additional Wound Procedures    Performed by: Margretta Pao, RN  Authorized by: Jerona Rosina SAILOR, DPM    Body area:  Lower extremity  Lower extremity location:  R lower leg and L lower leg    Compression wrap type: multi-layer    Wrap location: right    Wrap location equal debrided location?: yes    Debrided location: right    Multi-layer: coban 2     Procedure was performed in a clean field. The leg was washed with soap and water and then patted dry. Moisturizer was applied to the leg. A multi-layer compression bandage was applied per manufacturer's instructions. Patient tolerated procedure well and without complaints of pain or discomfort. Patient was educated regarding signs and symptoms of overcompression, including unrelieved pain, toes turning red or purple, or numbness in foot.  Patient was instructed to manually remove outer layer (without scissors) and contact clinic immediately.         Patient Education:  - Plan of care and wound dressings.  - Elevate legs for 30 mins,  3x/day, above the level of the heart. Above the level of the heart, is key here. The easiest way to achieve this is to lay down flat and prop your legs up with at least 2 pillows.  - Nutrition: with patients with chronic or slow-healing wounds, we recommend protein. Take your bodyweight, divide by 2 = daily protein goal, in grams.   - Provided Pt/CG with printed handouts: nutrition, compression wrap care, cast cover, and leg elevation.  - If wraps get wet/damaged, or if they're too tight, or if s/he has any questions: please call the [facility]. We are open M-F, 8:30-4:30, closed on weekends. If patient needs care during our off hours, please don't hesitate and go to Urgent Care or Emergency Room. Provided Pt with a printed handout regarding compressing wraps, and to never use scissors to remove wraps if too tight.   - Patient and/or Caregiver verbalized understanding.     Pao Margretta, RN   11/28/2023

## 2023-11-28 NOTE — Progress Notes (Signed)
 [facility]    Provider Documentation    The patient was seen in the office today and a initial evaluation was performed, including a history & focused exam.     History of Present Illness Initial Visit   [name] is a 68 y.o. 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  Has known history of venous insufficiency and has had history of multiple venous procedures done.  Has been seen at a vein clinic outside of the [facility] network.  According to the patient, ulcer has been present for the past 3 months.  Was referred to us  by her primary care provider, Dr. Jacob.     Previous treatments include applying bacitracin to the wound site, applying it 3 times a day and covering it with a bandage.  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  Her daughter who is accompanying her during today's visit states that she does not do much other than walk around, stating that her partner that works with her does more of the heavy workload.    Pertinent Wound Care History   Has been able to elevate her legs during work hours.     Interval History 11/28/2023:    she reports: tolerating treatment well and denies constitutional symptoms. Previously she had taken an emory board to file her nails on the right foot, creating a new wound to the right hallux last visit. This has also improved with current therapy. VLU on the RLE continues to respond well to current treatment plan.    Past Medical / Surgical / Family / Social History:     Medical History[1]  Past Surgical History[2]   Family History[3]  Social History[4]    Allergies and Home Medications:     Allergies[5]  Current Medications[6]    Physical Exam     Vitals:    11/28/23 1459   BP: 132/72   Pulse: 80   Resp: 18   Temp: 97.7 F (36.5 C)       There is no height or weight on file to calculate  BMI.    General Appearance - alert, well appearing and in no distress  Mental Status - alert, oriented to person, place and time  Vascular - See wound care exam  Skin - see wound care exam    NV Exam Obtained on 11/14/23  Right DP Pulse : Heard via doppler: Biphasic  Right PT Pulse : Heard via doppler: Triphasic  Left DP Pulse : Heard via doppler: Biphasic  Left PT Pulse : Heard via doppler: Triphasic  Left foot protective sensation impaired  Right foot protective sensation impaired    Focused Wound Care Exam   Wound Assessment:   Wound 11/14/23 Other (Comment) Edema Management Lower Leg Right;Lower (Active)   Date First Assessed/Time First Assessed: 11/14/23 1503   Primary Wound Type: Other (Comment)  Secondary Wound Type - Other: Edema Management  Location: Lower Leg  Wound Location Orientation: Right;Lower  Present on Original Admission: Yes      Assessments 11/28/2023  3:07 PM   Wound Image         Wound 11/14/23 Vascular Ulcer Lower Leg Distal;Right;Medial (Active)   Date First Assessed/Time First Assessed: 11/14/23 1503   Primary Wound Type: Vascular Ulcer  Location: Lower Leg  Wound Location Orientation: Distal;Right;Medial  Present on Original Admission:  Yes      Assessments 11/28/2023  3:08 PM   Wound Image     Wound Base Description Red;Granulation tissue;Fibrin/slough   Peri-wound Description Red   Wound Length (cm) 1.3 cm   Wound Width (cm) 1.5 cm   Wound Depth (cm) 0.1 cm   Wound Surface Area (cm^2) 1.53 cm^2   Wound Volume (cm^3) 0.102 cm^3   Closure Open   Drainage Amount Moderate   Drainage Description Serous   Tunneling No   Undermining  No   Margins Defined edges   Treatments Cleansed;Dressing applied   Topical Zinc - oxide paste   Dressing Change Freq 1x/week       Wound 11/21/23 Toe D1, great Right;Dorsal (Active)   Date First Assessed/Time First Assessed: 11/21/23 1522   Location: Toe D1, great  Wound Location Orientation: Right;Dorsal      Assessments 11/28/2023  3:09 PM   Wound Image     Wound  Base Description Scab;Partial Thickness   Peri-wound Description Clean;Dry   Wound Length (cm) 0.2 cm   Wound Width (cm) 0.3 cm   Wound Depth (cm) 0.1 cm   Wound Surface Area (cm^2) 0.05 cm^2   Wound Volume (cm^3) 0.003 cm^3   Closure Open   Drainage Amount None   Tunneling No   Undermining  No   Margins Attached edges   Treatments Cleansed;Dressing applied   Dressing Xeroform   Dressing Change Freq 1x/week       Edema  RLE Edema: Pitting; 1+ barely detectable  R Foot Measurements(cm): 21.5 cm  R Ankle Measurements(cm): 21 cm  R Calf Measurements(cm): 26 cm                            Labs and Imaging Reviewed   I have reviewed pertinent labs, imaging and cultures.    Assessment & Plan   I examined the patient for an wound(s) that has been resistant to healing despite numerous interventions. The plan is to now examine any underlying characteristics that may impede healing. I decided that the wounds are not self limited and significantly impact the patient's daily living.    Wound Assessment and Plan:  The wounds are assessed as followed: improving.  Both RLE ulcer and right hallux wound are showing improvement.  There were no signs of acute clinical or systemic infection, including erythema, warmth, induration, fluctuance, tenderness to palpation, purulence or malodor.  Procedures, if indicated based on today's evaluation of the patient and ulcer/wound characteristics, are detailed below.    Wound Care Plan:  Advanced Wound dressings appropriate to the needs of the wound have been initiated to provide a microenvironment which can enhance healing including maintaining a moist environment, decreasing bioburden and insulating and protecting the wound.   The treatment plan will remain the same  as stated in nursing notes.   An antimicrobial dressing was applied as the primary dressing for control of possible excess bioburden : silver alginate    Edema:    I reviewed the circumference measurements. The edema is improving  and the management plan will change.  The following edema recommendations have been made: Patient instructed to elevate the lower extremities above the level of the heart when possible.   Active compression with the use of Coban 2.     Vascular Status:   The patient's vascular status was assessed and is adequate for healing.     Nutrition:    Nutrition status is  inadequate.  I made suggestions regarding appropriate protein intake, vitamin supplementation, and adequate hydration. Recommendations include Juven- Arginaid amino acid supplementation, high protein shake , healthy diet, and optimized control of blood sugar as elevated A1c impairs wound healing    Other comorbidities that may affect wound healing: Non-healing wounds are a symptom of their significant comorbid diseases. Diabetes, neuropathy, Venous insufficiency, and edema    Coordination of Care: I recommended ongoing care with PCP Dr. Jacob    Patient Education:  The patient was educated on: Dressing management, optimization of nutrition, signs and symptoms of infection , call the clinic with any concerns, and strict ER precautions   Patient and daughter verbalized understanding and agreed with POC.   Home health was not indicated at this time..    The patient will continue to be seen on a regular basis.  Follow up in clinic in 1 week and PRN interval nurse visits for dressing changes.    The patient's condition required a significant, identifiable E/M service above and beyond the other service provided or services beyond the usual preoperative and postoperative care associated with the procedure that was performed.     Procedure Note   DEBRIDEMENT Lower Leg Distal;Right;Medial    Performed by: Jerona Rosina SAILOR, DPM  Authorized by: Jerona Rosina SAILOR, DPM  Associated wounds:   Wound 11/14/23 Vascular Ulcer Lower Leg Distal;Right;Medial      Consent:     Consent obtained:  Written    Consent given by:  Patient    Risks discussed: Yes      Time out:  Immediately prior to the procedure a time out was called    Debridement Details:     Performed by:  Physician    Type: excisional      Level: subcutaneous tissue      Pain control:  Lidocaine  2%    Pain control administration: topical        Time taken:  11/28/2023 3:08 PM  Pre-debridement measurements    Length (cm):  1.3    Width (cm):  1.5    Depth (cm):  0.1    Surface Area (cm^2):  1.53    Time taken:  11/28/2023 3:09 PM    Post-debridement measurements    Length (cm):  1.4    Width (cm):  1.6    Depth (cm):  0.2    Percent Debrided (%):  100    Surface Area (cm^2):  1.76    Area Debrided (cm^2):  1.76    Volume (cm^3):  0.23    Devitalized tissue debrided: biofilm, fibrin, necrotic debris and slough      Instrument:  Curette    Amount of bleeding: small    Specimen sent for culture:   Specimen sent for culture comment:  No  Specimen sent for pathology:   Specimen sent for pathology comment:  No  Culture obtained:  no  Culture obtained comment:  Culture not obtained.    Hemostasis obtained with:  Pressure    Procedural pain:  0    Post-procedural pain:  2    Response to treatment:  Procedure was tolerated well   The wound bed is comprised of 25-50% beefy red granulation tissue and 25-50% slough/fibrin non-viable tissue.      I decided to perform debridement today because, based on the patient's current status, there is an expectation that serial debridement will improve healing potential, reduce or control tissue infection, and prepare the tissue for surgical management. Surgical  debridement is required to excise a specific, targeted area of devitalized or necrotic tissue along the margin of viable tissue by sharp dissection. I have reviewed the individualized treatment plan for this patient who requires a debridement. Debridement is needed as part of the plan to control heavy wound colonization. I previously assessed the patient's vascular status which is adequate. I have ensured that this ulcer has been  adequately off-loaded. I am continuing to address nutritional issues, monitoring weights, encouraging the use of supplements and drawing laboratory tests when appropriate.                [1]   Past Medical History:  Diagnosis Date    Congenital anomaly of heart     Diabetes mellitus type 2 in obese     Diabetic retinopathy (CMS/HCC) 06/28/2013    Of the right eye, per patient    Glaucoma 06/01/2012    Hyperlipidemia     Hypertension     Obesity     Venous insufficiency 09/17/2014    Followed at [facility].  Wears compression stockings.    [2]   Past Surgical History:  Procedure Laterality Date    BREAST CYST EXCISION  1993    bilateral    TUBAL LIGATION     [3]   Family History  Problem Relation Name Age of Onset    Heart disease Mother      Diabetes Mother      Hypertension Mother      Hyperlipidemia Mother      Heart attack Mother      Heart disease Father      Diabetes Father      Hypertension Father      Hyperlipidemia Father      Cirrhosis Father      Diabetes Sister      Liver cancer Maternal Aunt      Cancer Maternal Uncle          liver   [4]   Social History  Tobacco Use    Smoking status: Never     Passive exposure: Never    Smokeless tobacco: Never   Vaping Use    Vaping status: Never Used   Substance Use Topics    Alcohol use: Never    Drug use: No   [5]   Allergies  Allergen Reactions    Shellfish-Derived Products    [6]   Current Outpatient Medications:     amLODIPine  (NORVASC ) 5 MG tablet, Take 1 tablet (5 mg) by mouth once daily, Disp: , Rfl:     atorvastatin  (LIPITOR) 40 MG tablet, Take 1 tablet (40 mg) by mouth every evening, Disp: 90 tablet, Rfl: 3    Blood Glucose Monitoring Suppl (ACCU-CHEK AVIVA PLUS) w/Device Kit, Check blood glucose level twice daily. ICD 10 Code E11.9, Z79.4, Disp: 1 kit, Rfl: 1    bumetanide  (BUMEX ) 2 MG tablet, Take 1 tablet (2 mg) by mouth Once every Monday, Wednesday and Friday morning, Disp: , Rfl:     carbamide peroxide (DEBROX) 6.5 % otic solution,  Place 5 drops into the right ear 2 (two) times daily, Disp: 15 mL, Rfl: 2    carvedilol  (COREG ) 12.5 MG tablet, Take 0.5 tablets (6.25 mg) by mouth 2 (two) times daily with meals, Disp: , Rfl:     dabigatran  (PRADAXA ) 150 MG Cap, Take 1 capsule (150 mg) by mouth every 12 (twelve) hours, Disp: 180 capsule, Rfl: 1    empagliflozin  (Jardiance ) 25 MG  tablet, Take 1 tablet (25 mg) by mouth every morning, Disp: 90 tablet, Rfl: 3    glucose blood (Accu-Chek Aviva) test strip, Check blood glucose level twice daily. ICD 10 Code E11.9, Z79.4, Disp: 200 each, Rfl: 2    insulin  NPH (NovoLIN  N) 100 UNIT/ML injection, INJECT 30 UNITS SUBCUTANEOUSLY WITH BREAKFST AND 35  UNITS WITH DINNERI, Disp: 60 mL, Rfl: 3    Insulin  Syringe-Needle U-100 (BD INSULIN  SYRINGE ULTRAFINE) 31G X 5/16 1 ML Misc, ADMINSTER INSULIN  UNDER THE SKIN 2 TIMES A DAY, Disp: 300 each, Rfl: 2    lisinopril  (ZESTRIL ) 40 MG tablet, TAKE 1 TABLET BY MOUTH DAILY, Disp: 90 tablet, Rfl: 0    timolol  (TIMOPTIC ) 0.5 % ophthalmic solution, Place 1 drop into both eyes daily, Disp: , Rfl:

## 2023-12-05 ENCOUNTER — Ambulatory Visit: Attending: Internal Medicine | Admitting: Foot & Ankle Surgery

## 2023-12-05 VITALS — BP 146/76 | HR 57 | Temp 97.7°F

## 2023-12-05 DIAGNOSIS — I872 Venous insufficiency (chronic) (peripheral): Secondary | ICD-10-CM | POA: Insufficient documentation

## 2023-12-05 DIAGNOSIS — E11621 Type 2 diabetes mellitus with foot ulcer: Secondary | ICD-10-CM | POA: Insufficient documentation

## 2023-12-05 DIAGNOSIS — L97512 Non-pressure chronic ulcer of other part of right foot with fat layer exposed: Secondary | ICD-10-CM | POA: Insufficient documentation

## 2023-12-05 DIAGNOSIS — E1142 Type 2 diabetes mellitus with diabetic polyneuropathy: Secondary | ICD-10-CM | POA: Insufficient documentation

## 2023-12-05 DIAGNOSIS — Z794 Long term (current) use of insulin: Secondary | ICD-10-CM | POA: Insufficient documentation

## 2023-12-05 DIAGNOSIS — L97312 Non-pressure chronic ulcer of right ankle with fat layer exposed: Secondary | ICD-10-CM

## 2023-12-05 DIAGNOSIS — I83018 Varicose veins of right lower extremity with ulcer other part of lower leg: Secondary | ICD-10-CM | POA: Insufficient documentation

## 2023-12-05 DIAGNOSIS — I83013 Varicose veins of right lower extremity with ulcer of ankle: Secondary | ICD-10-CM

## 2023-12-05 DIAGNOSIS — R600 Localized edema: Secondary | ICD-10-CM

## 2023-12-05 DIAGNOSIS — Z7984 Long term (current) use of oral hypoglycemic drugs: Secondary | ICD-10-CM | POA: Insufficient documentation

## 2023-12-05 DIAGNOSIS — L97812 Non-pressure chronic ulcer of other part of right lower leg with fat layer exposed: Secondary | ICD-10-CM | POA: Insufficient documentation

## 2023-12-05 NOTE — Progress Notes (Signed)
 [facility]  [facility]:  [address] [place], [place] [address]  T [phone]  F [phone]  Initial Wound Evaluation - Nursing Documentation    PATIENT: [name] DOB: 17-Nov-1955   MR #: [id number]  AGE: 68 y.o.    REFERRING MD:  Jacob Harlon GRADE, MD PRIMARY MD: Jacob Harlon GRADE, MD      Chief Complaint   Patient presents with    Edema    Wound Check     Assessment & Plan   Patient arrived in the wound center accompanied by daughter. RN reconciled meds and allergies.    Case Management:  Patient identification verified using two identifiers.  This patient has been determined to be at risk for a fall, extra precautionary measures have been taken to ensure their safety.    Pt's daughter served as interpreter   BG as reported by pt:100 mg/dl       Treatment:  R Medial Ankle: urgoclean ( fenestrated )  ABD to bolster anterior ankle. Coban 2.  R big toe dorsal: xeroform, kerlix medipore tape     RTC: 1 week.    Additional Wound Procedures    Performed by: Wilfred Alpers, RN  Authorized by: Jerona Rosina SAILOR, DPM  Associated wounds:   Wound 11/14/23 Other (Comment) Edema Management Lower Leg Right;Lower  Wound 11/14/23 Vascular Ulcer Lower Leg Distal;Right;Medial    Body area:  Lower extremity  Lower extremity location:  R lower leg    Compression wrap type: multi-layer    Wrap location: right    Wrap location equal debrided location?: yes    Debrided location: right    Multi-layer: coban 2     Procedure was performed in a clean field. The leg was washed with soap and water and then patted dry. Moisturizer was applied to the leg. A multi-layer compression bandage was applied per manufacturer's instructions. Patient tolerated procedure well and without complaints of pain or discomfort. Patient was educated regarding signs and symptoms of overcompression, including unrelieved pain, toes turning red or purple, or numbness in foot.  Patient was instructed to  manually remove outer layer (without scissors) and contact clinic immediately.             Patient Education:  - Plan of care and wound dressings.  - Elevate legs for 30 mins, 3x/day, above the level of the heart. Above the level of the heart, is key here. The easiest way to achieve this is to lay down flat and prop your legs up with at least 2 pillows.  - Nutrition: with patients with chronic or slow-healing wounds, we recommend protein. Take your bodyweight, divide by 2 = daily protein goal, in grams.   - Provided Pt/CG with printed handouts: nutrition, compression wrap care, cast cover, and leg elevation.  - If wraps get wet/damaged, or if they're too tight, or if s/he has any questions: please call the [facility] We are open M-F, 8:30-4:30, closed on weekends. If patient needs care during our off hours, please don't hesitate and go to Urgent Care or Emergency Room. Provided Pt with a printed handout regarding compressing wraps, and to never use scissors to remove wraps if too tight.   - Patient and/or Caregiver verbalized understanding.     [place], RN   12/05/2023

## 2023-12-05 NOTE — Progress Notes (Signed)
 [facility]    Provider Documentation    The patient was seen in the office today and a initial evaluation was performed, including a history & focused exam.     History of Present Illness Initial Visit   [name] is a 67 y.o. 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  Has known history of venous insufficiency and has had history of multiple venous procedures done.  Has been seen at a vein clinic outside of the [facility] network.  According to the patient, ulcer has been present for the past 3 months.  Was referred to us  by her primary care provider, Dr. Jacob.     Previous treatments include applying bacitracin to the wound site, applying it 3 times a day and covering it with a bandage.  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  Her daughter who is accompanying her during today's visit states that she does not do much other than walk around, stating that her partner that works with her does more of the heavy workload.    Pertinent Wound Care History   Has been able to elevate her legs during work hours.     Interval History 12/05/2023:    she reports: tolerating treatment well, no new complaints, denies constitutional symptoms.     Past Medical / Surgical / Family / Social History:     Medical History[1]  Past Surgical History[2]   Family History[3]  Social History[4]    Allergies and Home Medications:     Allergies[5]  Current Medications[6]    Physical Exam     Vitals:    12/05/23 1524   BP: 146/76   Pulse: (!) 57   Temp: 97.7 F (36.5 C)   SpO2: 97%       There is no height or weight on file to calculate BMI.    General Appearance - alert, well appearing and in no distress  Mental Status - alert, oriented to person, place and time  Vascular - See wound care exam  Skin - see wound care exam    NV Exam Obtained on  11/14/23  Right DP Pulse : Heard via doppler: Biphasic  Right PT Pulse : Heard via doppler: Triphasic  Left DP Pulse : Heard via doppler: Biphasic  Left PT Pulse : Heard via doppler: Triphasic  Left foot protective sensation impaired  Right foot protective sensation impaired    Focused Wound Care Exam   Wound Assessment:   Wound 11/14/23 Other (Comment) Edema Management Lower Leg Right;Lower (Active)   Date First Assessed/Time First Assessed: 11/14/23 1503   Primary Wound Type: Other (Comment)  Secondary Wound Type - Other: Edema Management  Location: Lower Leg  Wound Location Orientation: Right;Lower  Present on Original Admission: Yes      Assessments 12/05/2023  3:33 PM   Wound Image     Treatments Cleansed   Topical Moisturizing cream       Wound 11/14/23 Vascular Ulcer Lower Leg Distal;Right;Medial (Active)   Date First Assessed/Time First Assessed: 11/14/23 1503   Primary Wound Type: Vascular Ulcer  Location: Lower Leg  Wound Location Orientation: Distal;Right;Medial  Present on Original Admission: Yes      Assessments 12/05/2023  3:33 PM   Wound Image     Wound Base Description Moist;Fibrin/slough;Full Thickness   Peri-wound Description Peeling/Flaking;Fragile;Hyperpigmented  Wound Length (cm) 1.5 cm   Wound Width (cm) 1.3 cm   Wound Depth (cm) 0.2 cm   Wound Surface Area (cm^2) 1.53 cm^2   Wound Volume (cm^3) 0.204 cm^3   Closure Open   Drainage Amount Scant   Drainage Description Serosanguinous   Tunneling No   Undermining  No   Margins Open;Defined edges   Treatments Cleansed   Compression Wrap 1 Coban 2 or equivalent   Dressing Abdominal Dressing   Dressing Change Freq 1x/week       Wound 11/21/23 Toe D1, great Right;Dorsal (Active)   Date First Assessed/Time First Assessed: 11/21/23 1522   Location: Toe D1, great  Wound Location Orientation: Right;Dorsal      Assessments 12/05/2023  3:46 PM   Wound Image     Wound Base Description Dry;Eschar   Peri-wound Description Dry;Fragile   Wound Length (cm) 0.3 cm    Wound Width (cm) 0.4 cm   Wound Depth (cm) 0.1 cm   Wound Surface Area (cm^2) 0.09 cm^2   Wound Volume (cm^3) 0.006 cm^3   Closure Approximated, closed and dry   Drainage Amount None   Tunneling No   Undermining  No   Margins Attached edges   Treatments Cleansed   Topical Hyphochlorous Acid   Dressing Xeroform;Gauze       Edema  RLE Edema: Non-pitting  R Foot Measurements(cm): 21.5 cm  R Ankle Measurements(cm): 22 cm  R Calf Measurements(cm): 33.5 cm                            Labs and Imaging Reviewed   I have reviewed pertinent labs, imaging and cultures.    Assessment & Plan   I examined the patient for an wound(s) that has been resistant to healing despite numerous interventions. The plan is to now examine any underlying characteristics that may impede healing. I decided that the wounds are not self limited and significantly impact the patient's daily living.    Wound Assessment and Plan:  The wounds are assessed as followed: improving.  Both RLE ulcer and right hallux wound are showing improvement.  There were no signs of acute clinical or systemic infection, including erythema, warmth, induration, fluctuance, tenderness to palpation, purulence or malodor.  Procedures, if indicated based on today's evaluation of the patient and ulcer/wound characteristics, are detailed below.    Wound Care Plan:  Advanced Wound dressings appropriate to the needs of the wound have been initiated to provide a microenvironment which can enhance healing including maintaining a moist environment, decreasing bioburden and insulating and protecting the wound.   The treatment plan will remain the same  as stated in nursing notes.   An antimicrobial dressing was applied as the primary dressing for control of possible excess bioburden : silver alginate    Edema:    I reviewed the circumference measurements. The edema is improving and the management plan will change.  The following edema recommendations have been made: Patient  instructed to elevate the lower extremities above the level of the heart when possible.   Active compression with the use of Coban 2.     Vascular Status:   The patient's vascular status was assessed and is adequate for healing.     Nutrition:    Nutrition status is inadequate.  I made suggestions regarding appropriate protein intake, vitamin supplementation, and adequate hydration. Recommendations include Juven- Arginaid amino acid supplementation, high protein shake , healthy diet, and  optimized control of blood sugar as elevated A1c impairs wound healing    Other comorbidities that may affect wound healing: Non-healing wounds are a symptom of their significant comorbid diseases. Diabetes, neuropathy, Venous insufficiency, and edema    Coordination of Care: I recommended ongoing care with PCP Dr. Jacob    Patient Education:  The patient was educated on: Dressing management, optimization of nutrition, signs and symptoms of infection , call the clinic with any concerns, and strict ER precautions   Patient and daughter verbalized understanding and agreed with POC.   Home health was not indicated at this time..    The patient will continue to be seen on a regular basis.  Follow up in clinic in 1 week and PRN interval nurse visits for dressing changes.    The patient's condition required a significant, identifiable E/M service above and beyond the other service provided or services beyond the usual preoperative and postoperative care associated with the procedure that was performed.     Procedure Note   DEBRIDEMENT Lower Leg Distal;Right;Medial    Performed by: Jerona Rosina SAILOR, DPM  Authorized by: Jerona Rosina SAILOR, DPM  Associated wounds:   Wound 11/14/23 Vascular Ulcer Lower Leg Distal;Right;Medial      Consent:     Consent obtained:  Written    Consent given by:  Patient    Risks discussed: Yes      Time out: Immediately prior to the procedure a time out was called    Debridement Details:     Performed by:   Physician    Type: excisional      Level: subcutaneous tissue      Pain control:  Lidocaine  2%    Pain control administration: topical        Time taken:  12/05/2023 3:33 PM  Pre-debridement measurements    Length (cm):  1.5    Width (cm):  1.3    Depth (cm):  0.2    Surface Area (cm^2):  1.53    Time taken:  12/05/2023 3:34 PM    Post-debridement measurements    Length (cm):  1.6    Width (cm):  1.4    Depth (cm):  0.3    Percent Debrided (%):  100    Surface Area (cm^2):  1.76    Area Debrided (cm^2):  1.76    Volume (cm^3):  0.35    Devitalized tissue debrided: biofilm, fibrin, necrotic debris and slough      Amount of bleeding: small    Specimen sent for culture:   Specimen sent for culture comment:  No  Specimen sent for pathology:   Specimen sent for pathology comment:  No  Culture obtained:  no  Culture obtained comment:  Culture not obtained.    Hemostasis obtained with:  Pressure    Procedural pain:  Insensate    Post-procedural pain:  Insensate    Response to treatment:  Procedure was tolerated well   The wound bed is comprised of 25-50% beefy red granulation tissue and 25-50% slough/fibrin non-viable tissue.      I decided to perform debridement today because, based on the patient's current status, there is an expectation that serial debridement will improve healing potential, reduce or control tissue infection, and prepare the tissue for surgical management. Surgical debridement is required to excise a specific, targeted area of devitalized or necrotic tissue along the margin of viable tissue by sharp dissection. I have reviewed the individualized treatment plan for this patient who  requires a debridement. Debridement is needed as part of the plan to control heavy wound colonization. I previously assessed the patient's vascular status which is adequate. I have ensured that this ulcer has been adequately off-loaded. I am continuing to address nutritional issues, monitoring weights, encouraging the use of  supplements and drawing laboratory tests when appropriate.                  [1]   Past Medical History:  Diagnosis Date    Congenital anomaly of heart     Diabetes mellitus type 2 in obese     Diabetic retinopathy (CMS/HCC) 06/28/2013    Of the right eye, per patient    Glaucoma 06/01/2012    Hyperlipidemia     Hypertension     Obesity     Venous insufficiency 09/17/2014    Followed at [facility].  Wears compression stockings.    [2]   Past Surgical History:  Procedure Laterality Date    BREAST CYST EXCISION  1993    bilateral    TUBAL LIGATION     [3]   Family History  Problem Relation Name Age of Onset    Heart disease Mother      Diabetes Mother      Hypertension Mother      Hyperlipidemia Mother      Heart attack Mother      Heart disease Father      Diabetes Father      Hypertension Father      Hyperlipidemia Father      Cirrhosis Father      Diabetes Sister      Liver cancer Maternal Aunt      Cancer Maternal Uncle          liver   [4]   Social History  Tobacco Use    Smoking status: Never     Passive exposure: Never    Smokeless tobacco: Never   Vaping Use    Vaping status: Never Used   Substance Use Topics    Alcohol use: Never    Drug use: No   [5]   Allergies  Allergen Reactions    Shellfish-Derived Products    [6]   Current Outpatient Medications:     amLODIPine  (NORVASC ) 5 MG tablet, Take 1 tablet (5 mg) by mouth once daily, Disp: , Rfl:     atorvastatin  (LIPITOR) 40 MG tablet, Take 1 tablet (40 mg) by mouth every evening, Disp: 90 tablet, Rfl: 3    Blood Glucose Monitoring Suppl (ACCU-CHEK AVIVA PLUS) w/Device Kit, Check blood glucose level twice daily. ICD 10 Code E11.9, Z79.4, Disp: 1 kit, Rfl: 1    bumetanide  (BUMEX ) 2 MG tablet, Take 1 tablet (2 mg) by mouth Once every Monday, Wednesday and Friday morning, Disp: , Rfl:     carbamide peroxide (DEBROX) 6.5 % otic solution, Place 5 drops into the right ear 2 (two) times daily, Disp: 15 mL, Rfl: 2    carvedilol  (COREG ) 12.5 MG tablet,  Take 0.5 tablets (6.25 mg) by mouth 2 (two) times daily with meals, Disp: , Rfl:     dabigatran  (PRADAXA ) 150 MG Cap, Take 1 capsule (150 mg) by mouth every 12 (twelve) hours, Disp: 180 capsule, Rfl: 1    empagliflozin  (Jardiance ) 25 MG tablet, Take 1 tablet (25 mg) by mouth every morning, Disp: 90 tablet, Rfl: 3    glucose blood (Accu-Chek Aviva) test strip, Check blood glucose level twice daily. ICD 10  Code E11.9, Z79.4, Disp: 200 each, Rfl: 2    insulin  NPH (NovoLIN  N) 100 UNIT/ML injection, INJECT 30 UNITS SUBCUTANEOUSLY WITH BREAKFST AND 35  UNITS WITH DINNERI, Disp: 60 mL, Rfl: 3    Insulin  Syringe-Needle U-100 (BD INSULIN  SYRINGE ULTRAFINE) 31G X 5/16 1 ML Misc, ADMINSTER INSULIN  UNDER THE SKIN 2 TIMES A DAY, Disp: 300 each, Rfl: 2    lisinopril  (ZESTRIL ) 40 MG tablet, TAKE 1 TABLET BY MOUTH DAILY, Disp: 90 tablet, Rfl: 0    timolol  (TIMOPTIC ) 0.5 % ophthalmic solution, Place 1 drop into both eyes daily, Disp: , Rfl:

## 2023-12-12 ENCOUNTER — Ambulatory Visit: Attending: Vascular Surgery | Admitting: Foot & Ankle Surgery

## 2023-12-12 VITALS — BP 153/75 | HR 61 | Temp 98.2°F

## 2023-12-12 DIAGNOSIS — Z794 Long term (current) use of insulin: Secondary | ICD-10-CM | POA: Insufficient documentation

## 2023-12-12 DIAGNOSIS — R600 Localized edema: Secondary | ICD-10-CM

## 2023-12-12 DIAGNOSIS — Z7984 Long term (current) use of oral hypoglycemic drugs: Secondary | ICD-10-CM | POA: Insufficient documentation

## 2023-12-12 DIAGNOSIS — L97312 Non-pressure chronic ulcer of right ankle with fat layer exposed: Secondary | ICD-10-CM

## 2023-12-12 DIAGNOSIS — I83013 Varicose veins of right lower extremity with ulcer of ankle: Secondary | ICD-10-CM

## 2023-12-12 DIAGNOSIS — E11622 Type 2 diabetes mellitus with other skin ulcer: Secondary | ICD-10-CM | POA: Insufficient documentation

## 2023-12-12 DIAGNOSIS — E1140 Type 2 diabetes mellitus with diabetic neuropathy, unspecified: Secondary | ICD-10-CM | POA: Insufficient documentation

## 2023-12-12 DIAGNOSIS — L97812 Non-pressure chronic ulcer of other part of right lower leg with fat layer exposed: Secondary | ICD-10-CM | POA: Insufficient documentation

## 2023-12-12 DIAGNOSIS — I872 Venous insufficiency (chronic) (peripheral): Secondary | ICD-10-CM | POA: Insufficient documentation

## 2023-12-12 NOTE — Progress Notes (Signed)
 [facility]  [facility]:  [address] [place], [place] [address]  T [phone]  F [phone]  Wound Follow-up - Nursing Documentation    PATIENT: [name] DOB: 1955-12-04   MR #: [id number]  AGE: 68 y.o.    REFERRING MD:  Jacob Harlon GRADE, MD PRIMARY MD: Jacob Harlon GRADE, MD      Chief Complaint   Patient presents with    Wound Check        Assessment & Plan     Case Management:  Patient identification verified using two identifiers.  This patient has been determined to be at risk for a fall, extra precautionary measures have been taken to ensure their safety.          Treatment:    POC: R Medial Ankle: urgoclean (fenestrated),  ABD to bolster anterior ankle, and coban 2    R big toe dorsal: HEALED - left open to air    RTC: 1 week        Procedures:  Additional Wound Procedures    Performed by: Babara Almarie PARAS, RN  Authorized by: Jerona Rosina SAILOR, DPM  Associated wounds:   Wound 11/14/23 Other (Comment) Edema Management Lower Leg Right;Lower  Wound 11/14/23 Vascular Ulcer Lower Leg Distal;Right;Medial    Body area:  Lower extremity  Lower extremity location:  R lower leg and L lower leg    Compression wrap type: multi-layer    Wrap location: bilateral    Wrap location equal debrided location?: yes    Debrided location: bilateral    Multi-layer: coban 2     Procedure was performed in a clean field. The leg was washed with soap and water and then patted dry. Moisturizer was applied to the leg. A multi-layer compression bandage was applied per manufacturer's instructions. Patient tolerated procedure well and without complaints of pain or discomfort. Patient was educated regarding signs and symptoms of overcompression, including unrelieved pain, toes turning red or purple, or numbness in foot.  Patient was instructed to manually remove outer layer (without scissors) and contact clinic immediately.         Patient Education:      If wraps get wet/damaged, or if  they're too tight, or if s/he has any questions: please call the [facility]. We are open M-F, 8:30-4:30, closed on weekends. If patient needs care during our off hours, please don't hesitate and go to Urgent Care or Emergency Room. Pt never to use scissors to remove wraps if too tight. Patient verbalized understanding.      Almarie PARAS Babara, RN   12/12/2023

## 2023-12-12 NOTE — Progress Notes (Signed)
 [facility]    Provider Documentation    The patient was seen in the office today and a initial evaluation was performed, including a history & focused exam.     History of Present Illness Initial Visit   [name] is a 68 y.o. 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  Has known history of venous insufficiency and has had history of multiple venous procedures done.  Has been seen at a vein clinic outside of the [facility] network.  According to the patient, ulcer has been present for the past 3 months.  Was referred to us  by her primary care provider, Dr. Jacob.     Previous treatments include applying bacitracin to the wound site, applying it 3 times a day and covering it with a bandage.  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  Her daughter who is accompanying her during today's visit states that she does not do much other than walk around, stating that her partner that works with her does more of the heavy workload.    Pertinent Wound Care History   Has been able to elevate her legs during work hours.     Interval History 12/12/2023:    she reports: tolerating treatment well, no new complaints, denies constitutional symptoms.     Past Medical / Surgical / Family / Social History:     Medical History[1]  Past Surgical History[2]   Family History[3]  Social History[4]    Allergies and Home Medications:     Allergies[5]  Current Medications[6]    Physical Exam     Vitals:    12/12/23 1547   BP: 153/75   Pulse: 61   Temp: 98.2 F (36.8 C)       There is no height or weight on file to calculate BMI.    General Appearance - alert, well appearing and in no distress  Mental Status - alert, oriented to person, place and time  Vascular - See wound care exam  Skin - see wound care exam    NV Exam Obtained on 11/14/23  Right DP Pulse :  Heard via doppler: Biphasic  Right PT Pulse : Heard via doppler: Triphasic  Left DP Pulse : Heard via doppler: Biphasic  Left PT Pulse : Heard via doppler: Triphasic  Left foot protective sensation impaired  Right foot protective sensation impaired    Focused Wound Care Exam   Wound Assessment:   Wound 11/14/23 Other (Comment) Edema Management Lower Leg Right;Lower (Active)   Date First Assessed/Time First Assessed: 11/14/23 1503   Primary Wound Type: Other (Comment)  Secondary Wound Type - Other: Edema Management  Location: Lower Leg  Wound Location Orientation: Right;Lower  Present on Original Admission: Yes      Assessments 12/12/2023  3:55 PM   Wound Image         Wound 11/14/23 Vascular Ulcer Lower Leg Distal;Right;Medial (Active)   Date First Assessed/Time First Assessed: 11/14/23 1503   Primary Wound Type: Vascular Ulcer  Location: Lower Leg  Wound Location Orientation: Distal;Right;Medial  Present on Original Admission: Yes      Assessments 12/12/2023  3:00 PM 12/12/2023  3:55 PM   Wound Image      Wound Base Description Granulation tissue;Full Thickness;Fibrin/slough;Eschar;Scattered --   Peri-wound Description Dark edges --   Wound Length (cm) 1.3 cm  0.7 cm   Wound Width (cm) 1.7 cm 0.8 cm   Wound Depth (cm) 0.1 cm 0.1 cm   Wound Surface Area (cm^2) 1.74 cm^2 0.44 cm^2   Wound Volume (cm^3) 0.116 cm^3 0.029 cm^3   Drainage Amount Small --   Drainage Description Serosanguinous --   Treatments Cleansed --   Compression Wrap 1 Coban 2 or equivalent --   Dressing Abdominal Dressing --   Dressing Change Freq 1x/week --       Wound 11/21/23 Toe D1, great Right;Dorsal (Active)   Date First Assessed/Time First Assessed: 11/21/23 1522   Location: Toe D1, great  Wound Location Orientation: Right;Dorsal      Assessments 12/12/2023  3:56 PM   Wound Image     Wound Length (cm) 0 cm   Wound Width (cm) 0 cm   Wound Depth (cm) 0 cm   Wound Surface Area (cm^2) 0 cm^2   Wound Volume (cm^3) 0 cm^3   Closure Healed   Dressing  Open to air       Edema  RLE Edema: Pitting; 2+ indentation < 5mm  R Foot Measurements(cm): 22 cm  R Ankle Measurements(cm): 21.5 cm  R Calf Measurements(cm): 35.5 cm  RLE Treatment: RLE Multilayer  RLE Multilayer Tx: Coban 2                            Labs and Imaging Reviewed   I have reviewed pertinent labs, imaging and cultures.    Assessment & Plan   I examined the patient for an wound(s) that has been resistant to healing despite numerous interventions. The plan is to now examine any underlying characteristics that may impede healing. I decided that the wounds are not self limited and significantly impact the patient's daily living.    Wound Assessment and Plan:  The wounds are assessed as followed: improving.  Both RLE ulcer and right hallux wound are showing improvement.  There were no signs of acute clinical or systemic infection, including erythema, warmth, induration, fluctuance, tenderness to palpation, purulence or malodor.  Procedures, if indicated based on today's evaluation of the patient and ulcer/wound characteristics, are detailed below.    Wound Care Plan:  Advanced Wound dressings appropriate to the needs of the wound have been initiated to provide a microenvironment which can enhance healing including maintaining a moist environment, decreasing bioburden and insulating and protecting the wound.   The treatment plan will remain the same  as stated in nursing notes.   An antimicrobial dressing was applied as the primary dressing for control of possible excess bioburden : silver alginate    Edema:    I reviewed the circumference measurements. The edema is improving and the management plan will change.  The following edema recommendations have been made: Patient instructed to elevate the lower extremities above the level of the heart when possible.   Active compression with the use of Coban 2.     Vascular Status:   The patient's vascular status was assessed and is adequate for healing.      Nutrition:    Nutrition status is inadequate.  I made suggestions regarding appropriate protein intake, vitamin supplementation, and adequate hydration. Recommendations include Juven- Arginaid amino acid supplementation, high protein shake , healthy diet, and optimized control of blood sugar as elevated A1c impairs wound healing    Other comorbidities that may affect wound healing: Non-healing wounds are a symptom of their significant comorbid diseases. Diabetes,  neuropathy, Venous insufficiency, and edema    Coordination of Care: I recommended ongoing care with PCP Dr. Jacob    Patient Education:  The patient was educated on: Dressing management, optimization of nutrition, signs and symptoms of infection , call the clinic with any concerns, and strict ER precautions   Patient and daughter verbalized understanding and agreed with POC.   Home health was not indicated at this time..    The patient will continue to be seen on a regular basis.  Follow up in clinic in 1 week and PRN interval nurse visits for dressing changes.    The patient's condition required a significant, identifiable E/M service above and beyond the other service provided or services beyond the usual preoperative and postoperative care associated with the procedure that was performed.     Procedure Note   DEBRIDEMENT Lower Leg Distal;Right;Medial    Performed by: Jerona Rosina SAILOR, DPM  Authorized by: Jerona Rosina SAILOR, DPM  Associated wounds:   Wound 11/14/23 Vascular Ulcer Lower Leg Distal;Right;Medial      Consent:     Consent obtained:  Written    Consent given by:  Patient    Risks discussed: Yes      Time out: Immediately prior to the procedure a time out was called    Debridement Details:     Performed by:  Physician    Type: excisional      Level: subcutaneous tissue      Pain control:  Lidocaine  2%    Pain control administration: topical        Time taken:  12/12/2023 3:55 PM  Pre-debridement measurements    Length (cm):  0.7    Width  (cm):  0.8    Depth (cm):  0.1    Surface Area (cm^2):  0.44    Time taken:  12/12/2023 3:56 PM    Post-debridement measurements    Length (cm):  0.8    Width (cm):  0.9    Depth (cm):  0.2    Percent Debrided (%):  100    Surface Area (cm^2):  0.57    Area Debrided (cm^2):  0.57    Volume (cm^3):  0.08    Devitalized tissue debrided: biofilm, callus, fibrin, necrotic debris and slough      Instrument:  Curette    Amount of bleeding: small      Hemostasis obtained with:  Pressure    Procedural pain:  Insensate    Post-procedural pain:  Insensate    Response to treatment:  Procedure was tolerated well   The wound bed is comprised of 25-50% beefy red granulation tissue and 25-50% slough/fibrin non-viable tissue.      I decided to perform debridement today because, based on the patient's current status, there is an expectation that serial debridement will improve healing potential, reduce or control tissue infection, and prepare the tissue for surgical management. Surgical debridement is required to excise a specific, targeted area of devitalized or necrotic tissue along the margin of viable tissue by sharp dissection. I have reviewed the individualized treatment plan for this patient who requires a debridement. Debridement is needed as part of the plan to control heavy wound colonization. I previously assessed the patient's vascular status which is adequate. I have ensured that this ulcer has been adequately off-loaded. I am continuing to address nutritional issues, monitoring weights, encouraging the use of supplements and drawing laboratory tests when appropriate.                    [  1]   Past Medical History:  Diagnosis Date    Congenital anomaly of heart     Diabetes mellitus type 2 in obese     Diabetic retinopathy (CMS/HCC) 06/28/2013    Of the right eye, per patient    Glaucoma 06/01/2012    Hyperlipidemia     Hypertension     Obesity     Venous insufficiency 09/17/2014    Followed at [facility].  Wears compression stockings.    [2]   Past Surgical History:  Procedure Laterality Date    BREAST CYST EXCISION  1993    bilateral    TUBAL LIGATION     [3]   Family History  Problem Relation Name Age of Onset    Heart disease Mother      Diabetes Mother      Hypertension Mother      Hyperlipidemia Mother      Heart attack Mother      Heart disease Father      Diabetes Father      Hypertension Father      Hyperlipidemia Father      Cirrhosis Father      Diabetes Sister      Liver cancer Maternal Aunt      Cancer Maternal Uncle          liver   [4]   Social History  Tobacco Use    Smoking status: Never     Passive exposure: Never    Smokeless tobacco: Never   Vaping Use    Vaping status: Never Used   Substance Use Topics    Alcohol use: Never    Drug use: No   [5]   Allergies  Allergen Reactions    Shellfish-Derived Products    [6]   Current Outpatient Medications:     amLODIPine  (NORVASC ) 5 MG tablet, Take 1 tablet (5 mg) by mouth once daily, Disp: , Rfl:     atorvastatin  (LIPITOR) 40 MG tablet, Take 1 tablet (40 mg) by mouth every evening, Disp: 90 tablet, Rfl: 3    Blood Glucose Monitoring Suppl (ACCU-CHEK AVIVA PLUS) w/Device Kit, Check blood glucose level twice daily. ICD 10 Code E11.9, Z79.4, Disp: 1 kit, Rfl: 1    bumetanide  (BUMEX ) 2 MG tablet, Take 1 tablet (2 mg) by mouth Once every Monday, Wednesday and Friday morning, Disp: , Rfl:     carbamide peroxide (DEBROX) 6.5 % otic solution, Place 5 drops into the right ear 2 (two) times daily, Disp: 15 mL, Rfl: 2    carvedilol  (COREG ) 12.5 MG tablet, Take 0.5 tablets (6.25 mg) by mouth 2 (two) times daily with meals, Disp: , Rfl:     dabigatran  (PRADAXA ) 150 MG Cap, Take 1 capsule (150 mg) by mouth every 12 (twelve) hours, Disp: 180 capsule, Rfl: 1    empagliflozin  (Jardiance ) 25 MG tablet, Take 1 tablet (25 mg) by mouth every morning, Disp: 90 tablet, Rfl: 3    glucose blood (Accu-Chek Aviva) test strip, Check blood glucose level twice daily. ICD  10 Code E11.9, Z79.4, Disp: 200 each, Rfl: 2    insulin  NPH (NovoLIN  N) 100 UNIT/ML injection, INJECT 30 UNITS SUBCUTANEOUSLY WITH BREAKFST AND 35  UNITS WITH DINNERI, Disp: 60 mL, Rfl: 3    Insulin  Syringe-Needle U-100 (BD INSULIN  SYRINGE ULTRAFINE) 31G X 5/16 1 ML Misc, ADMINSTER INSULIN  UNDER THE SKIN 2 TIMES A DAY, Disp: 300 each, Rfl: 2    lisinopril  (ZESTRIL ) 40 MG tablet,  TAKE 1 TABLET BY MOUTH DAILY, Disp: 90 tablet, Rfl: 0    timolol  (TIMOPTIC ) 0.5 % ophthalmic solution, Place 1 drop into both eyes daily, Disp: , Rfl:

## 2023-12-15 ENCOUNTER — Other Ambulatory Visit (FREE_STANDING_LABORATORY_FACILITY)

## 2023-12-15 DIAGNOSIS — Z01818 Encounter for other preprocedural examination: Secondary | ICD-10-CM

## 2023-12-15 DIAGNOSIS — I871 Compression of vein: Secondary | ICD-10-CM

## 2023-12-15 LAB — BASIC METABOLIC PANEL
Anion Gap: 9 (ref 5.0–15.0)
BUN: 22 mg/dL — ABNORMAL HIGH (ref 7–21)
CO2: 28 meq/L (ref 17–29)
Calcium: 9.2 mg/dL (ref 8.5–10.5)
Chloride: 104 meq/L (ref 99–111)
Creatinine: 1.3 mg/dL — ABNORMAL HIGH (ref 0.4–1.0)
GFR: 44.3 mL/min/1.73 m2 — ABNORMAL LOW (ref >=60.0)
Glucose: 212 mg/dL — ABNORMAL HIGH (ref 70–100)
Hemolysis Index: 10 {index}
Potassium: 4.1 meq/L (ref 3.5–5.3)
Sodium: 141 meq/L (ref 135–145)

## 2023-12-15 LAB — PT/INR
INR: 1.2 — ABNORMAL HIGH (ref 0.9–1.1)
PT: 13.9 s — ABNORMAL HIGH (ref 10.1–12.9)

## 2023-12-15 LAB — CBC
Absolute nRBC: 0 x10 3/uL (ref <=0.00)
Hematocrit: 44.6 % — ABNORMAL HIGH (ref 34.7–43.7)
Hemoglobin: 14.7 g/dL (ref 11.4–14.8)
MCH: 29.9 pg (ref 25.1–33.5)
MCHC: 33 g/dL (ref 31.5–35.8)
MCV: 90.7 fL (ref 78.0–96.0)
MPV: 12.4 fL (ref 8.9–12.5)
Platelet Count: 218 x10 3/uL (ref 142–346)
RBC: 4.92 x10 6/uL (ref 3.90–5.10)
RDW: 14 % (ref 11–15)
WBC: 8.32 x10 3/uL (ref 3.10–9.50)
nRBC %: 0 /100{WBCs} (ref <=0.0)

## 2023-12-19 ENCOUNTER — Ambulatory Visit: Attending: Foot & Ankle Surgery | Admitting: Foot & Ankle Surgery

## 2023-12-19 VITALS — BP 154/71 | HR 61 | Temp 97.9°F | Resp 18

## 2023-12-19 DIAGNOSIS — I83018 Varicose veins of right lower extremity with ulcer other part of lower leg: Secondary | ICD-10-CM | POA: Insufficient documentation

## 2023-12-19 DIAGNOSIS — L97812 Non-pressure chronic ulcer of other part of right lower leg with fat layer exposed: Secondary | ICD-10-CM | POA: Insufficient documentation

## 2023-12-19 DIAGNOSIS — R600 Localized edema: Secondary | ICD-10-CM

## 2023-12-19 DIAGNOSIS — Z794 Long term (current) use of insulin: Secondary | ICD-10-CM | POA: Insufficient documentation

## 2023-12-19 DIAGNOSIS — E1142 Type 2 diabetes mellitus with diabetic polyneuropathy: Secondary | ICD-10-CM | POA: Insufficient documentation

## 2023-12-19 DIAGNOSIS — L97312 Non-pressure chronic ulcer of right ankle with fat layer exposed: Secondary | ICD-10-CM

## 2023-12-19 DIAGNOSIS — I872 Venous insufficiency (chronic) (peripheral): Secondary | ICD-10-CM | POA: Insufficient documentation

## 2023-12-19 DIAGNOSIS — Z7984 Long term (current) use of oral hypoglycemic drugs: Secondary | ICD-10-CM | POA: Insufficient documentation

## 2023-12-19 DIAGNOSIS — I10 Essential (primary) hypertension: Secondary | ICD-10-CM | POA: Insufficient documentation

## 2023-12-19 NOTE — Progress Notes (Signed)
 [facility]    Provider Documentation    The patient was seen in the office today and a initial evaluation was performed, including a history & focused exam.     History of Present Illness Initial Visit   [name] is a 68 y.o. 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  Has known history of venous insufficiency and has had history of multiple venous procedures done.  Has been seen at a vein clinic outside of the [place] network.  According to the patient, ulcer has been present for the past 3 months.  Was referred to us  by her primary care provider, Dr. Jacob.     Previous treatments include applying bacitracin to the wound site, applying it 3 times a day and covering it with a bandage.  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  Her daughter who is accompanying her during today's visit states that she does not do much other than walk around, stating that her partner that works with her does more of the heavy workload.    Pertinent Wound Care History   Has been able to elevate her legs during work hours.     Interval History 12/19/2023:    she reports: tolerating treatment well, no new complaints, denies constitutional symptoms.     Past Medical / Surgical / Family / Social History:     Medical History[1]  Past Surgical History[2]   Family History[3]  Social History[4]    Allergies and Home Medications:     Allergies[5]  Current Medications[6]    Physical Exam     Vitals:    12/19/23 1602   BP: 154/71   Pulse: 61   Resp: 18   Temp: 97.9 F (36.6 C)       There is no height or weight on file to calculate BMI.    General Appearance - alert, well appearing and in no distress  Mental Status - alert, oriented to person, place and time  Vascular - See wound care exam  Skin - see wound care exam    NV Exam Obtained on 11/14/23  Right  DP Pulse : Heard via doppler: Biphasic  Right PT Pulse : Heard via doppler: Triphasic  Left DP Pulse : Heard via doppler: Biphasic  Left PT Pulse : Heard via doppler: Triphasic  Left foot protective sensation impaired  Right foot protective sensation impaired    Focused Wound Care Exam   Wound Assessment:   Wound 11/14/23 Other (Comment) Edema Management Lower Leg Right;Lower (Active)   Date First Assessed/Time First Assessed: 11/14/23 1503   Primary Wound Type: Other (Comment)  Secondary Wound Type - Other: Edema Management  Location: Lower Leg  Wound Location Orientation: Right;Lower  Present on Original Admission: Yes      Assessments 12/19/2023  4:10 PM   Wound Image         Wound 11/14/23 Vascular Ulcer Lower Leg Distal;Right;Medial (Active)   Date First Assessed/Time First Assessed: 11/14/23 1503   Primary Wound Type: Vascular Ulcer  Location: Lower Leg  Wound Location Orientation: Distal;Right;Medial  Present on Original Admission: Yes      Assessments 12/19/2023  4:10 PM   Wound Image     Wound Base Description Red;Granulation tissue;Full Thickness   Peri-wound Description Clean;Dry   Wound Length (cm) 1.4 cm   Wound Width (  cm) 0.8 cm   Wound Depth (cm) 0.1 cm   Wound Surface Area (cm^2) 0.88 cm^2   Wound Volume (cm^3) 0.059 cm^3   Closure Open   Drainage Amount Moderate   Drainage Description Serosanguinous   Tunneling No   Undermining  No   Margins Defined edges   Treatments Cleansed;Dressing applied   Compression Wrap 1 Coban 2 or equivalent   Dressing Change Freq 1x/week       Edema  RLE Edema: Non-pitting  R Foot Measurements(cm): 20.5 cm  R Ankle Measurements(cm): 21 cm  R Calf Measurements(cm): 35 cm  RLE Treatment: RLE Multilayer  RLE Multilayer Tx: Coban 2                            Labs and Imaging Reviewed   I have reviewed pertinent labs, imaging and cultures.    Assessment & Plan   I examined the patient for an wound(s) that has been resistant to healing despite numerous interventions. The plan  is to now examine any underlying characteristics that may impede healing. I decided that the wounds are not self limited and significantly impact the patient's daily living.    Wound Assessment and Plan:  The wounds are assessed as followed: improving.  Both RLE ulcer and right hallux wound are showing improvement.  There were no signs of acute clinical or systemic infection, including erythema, warmth, induration, fluctuance, tenderness to palpation, purulence or malodor.  Procedures, if indicated based on today's evaluation of the patient and ulcer/wound characteristics, are detailed below.    Wound Care Plan:  Advanced Wound dressings appropriate to the needs of the wound have been initiated to provide a microenvironment which can enhance healing including maintaining a moist environment, decreasing bioburden and insulating and protecting the wound.   The treatment plan will change  as stated in nursing notes.   An antimicrobial dressing was applied as the primary dressing for control of possible excess bioburden : Prisma Ag advanced collagen therapy    Edema:    I reviewed the circumference measurements. The edema is improving and the management plan will change.  The following edema recommendations have been made: Patient instructed to elevate the lower extremities above the level of the heart when possible.   Active compression with the use of Coban 2.     Vascular Status:   The patient's vascular status was assessed and is adequate for healing.     Nutrition:    Nutrition status is inadequate.  I made suggestions regarding appropriate protein intake, vitamin supplementation, and adequate hydration. Recommendations include Juven- Arginaid amino acid supplementation, high protein shake , healthy diet, and optimized control of blood sugar as elevated A1c impairs wound healing    Other comorbidities that may affect wound healing: Non-healing wounds are a symptom of their significant comorbid diseases. Diabetes,  neuropathy, Venous insufficiency, and edema    Coordination of Care: I recommended ongoing care with PCP Dr. Jacob    Patient Education:  The patient was educated on: Dressing management, optimization of nutrition, signs and symptoms of infection , call the clinic with any concerns, and strict ER precautions   Patient and daughter verbalized understanding and agreed with POC.   Home health was not indicated at this time..    The patient will continue to be seen on a regular basis.  Follow up in clinic in 1 week and PRN interval nurse visits for dressing changes.  The patient's condition required a significant, identifiable E/M service above and beyond the other service provided or services beyond the usual preoperative and postoperative care associated with the procedure that was performed.     Procedure Note   DEBRIDEMENT Lower Leg Distal;Right;Medial    Performed by: Jerona Rosina SAILOR, DPM  Authorized by: Jerona Rosina SAILOR, DPM  Associated wounds:   Wound 11/14/23 Vascular Ulcer Lower Leg Distal;Right;Medial      Consent:     Consent obtained:  Written    Consent given by:  Patient    Risks discussed: Yes      Time out: Immediately prior to the procedure a time out was called    Debridement Details:     Performed by:  Physician    Type: excisional      Level: subcutaneous tissue      Pain control:  Lidocaine  2%    Pain control administration: topical        Time taken:  12/19/2023 4:10 PM  Pre-debridement measurements    Length (cm):  1.4    Width (cm):  0.8    Depth (cm):  0.1    Surface Area (cm^2):  0.88    Time taken:  12/19/2023 4:11 PM    Post-debridement measurements    Length (cm):  1.5    Width (cm):  0.9    Depth (cm):  0.2    Percent Debrided (%):  100    Surface Area (cm^2):  1.06    Area Debrided (cm^2):  1.06    Volume (cm^3):  0.14    Devitalized tissue debrided: biofilm, fibrin, necrotic debris and slough      Instrument:  Curette    Amount of bleeding: small    Specimen sent for culture:    Specimen sent for culture comment:  No  Specimen sent for pathology:   Specimen sent for pathology comment:  No  Culture obtained:  no  Culture obtained comment:  Culture not obtained.    Hemostasis obtained with:  Pressure    Procedural pain:  Insensate    Post-procedural pain:  Insensate    Response to treatment:  Procedure was tolerated well   The wound bed is comprised of >50% beefy red granulation tissue and <25% slough/fibrin non-viable tissue.      I decided to perform debridement today because, based on the patient's current status, there is an expectation that serial debridement will improve healing potential, reduce or control tissue infection, and prepare the tissue for surgical management. Surgical debridement is required to excise a specific, targeted area of devitalized or necrotic tissue along the margin of viable tissue by sharp dissection. I have reviewed the individualized treatment plan for this patient who requires a debridement. Debridement is needed as part of the plan to control heavy wound colonization. I previously assessed the patient's vascular status which is adequate. I have ensured that this ulcer has been adequately off-loaded. I am continuing to address nutritional issues, monitoring weights, encouraging the use of supplements and drawing laboratory tests when appropriate.                      [1]   Past Medical History:  Diagnosis Date    Congenital anomaly of heart     Diabetes mellitus type 2 in obese     Diabetic retinopathy (CMS/HCC) 06/28/2013    Of the right eye, per patient    Glaucoma 06/01/2012    Hyperlipidemia  Hypertension     Obesity     Venous insufficiency 09/17/2014    Followed at [facility].  Wears compression stockings.    [2]   Past Surgical History:  Procedure Laterality Date    BREAST CYST EXCISION  1993    bilateral    TUBAL LIGATION     [3]   Family History  Problem Relation Name Age of Onset    Heart disease Mother      Diabetes Mother       Hypertension Mother      Hyperlipidemia Mother      Heart attack Mother      Heart disease Father      Diabetes Father      Hypertension Father      Hyperlipidemia Father      Cirrhosis Father      Diabetes Sister      Liver cancer Maternal Aunt      Cancer Maternal Uncle          liver   [4]   Social History  Tobacco Use    Smoking status: Never     Passive exposure: Never    Smokeless tobacco: Never   Vaping Use    Vaping status: Never Used   Substance Use Topics    Alcohol use: Never    Drug use: No   [5]   Allergies  Allergen Reactions    Shellfish Protein-Containing Drug Products    [6]   Current Outpatient Medications:     amLODIPine  (NORVASC ) 5 MG tablet, Take 1 tablet (5 mg) by mouth once daily, Disp: , Rfl:     atorvastatin  (LIPITOR) 40 MG tablet, Take 1 tablet (40 mg) by mouth every evening, Disp: 90 tablet, Rfl: 3    Blood Glucose Monitoring Suppl (ACCU-CHEK AVIVA PLUS) w/Device Kit, Check blood glucose level twice daily. ICD 10 Code E11.9, Z79.4, Disp: 1 kit, Rfl: 1    bumetanide  (BUMEX ) 2 MG tablet, Take 1 tablet (2 mg) by mouth Once every Monday, Wednesday and Friday morning, Disp: , Rfl:     carbamide peroxide (DEBROX) 6.5 % otic solution, Place 5 drops into the right ear 2 (two) times daily, Disp: 15 mL, Rfl: 2    carvedilol  (COREG ) 12.5 MG tablet, Take 0.5 tablets (6.25 mg) by mouth 2 (two) times daily with meals, Disp: , Rfl:     dabigatran  (PRADAXA ) 150 MG Cap, Take 1 capsule (150 mg) by mouth every 12 (twelve) hours, Disp: 180 capsule, Rfl: 1    empagliflozin  (Jardiance ) 25 MG tablet, Take 1 tablet (25 mg) by mouth every morning, Disp: 90 tablet, Rfl: 3    glucose blood (Accu-Chek Aviva) test strip, Check blood glucose level twice daily. ICD 10 Code E11.9, Z79.4, Disp: 200 each, Rfl: 2    insulin  NPH (NovoLIN  N) 100 UNIT/ML injection, INJECT 30 UNITS SUBCUTANEOUSLY WITH BREAKFST AND 35  UNITS WITH DINNERI, Disp: 60 mL, Rfl: 3    Insulin  Syringe-Needle U-100 (BD INSULIN  SYRINGE ULTRAFINE) 31G X  5/16 1 ML Misc, ADMINSTER INSULIN  UNDER THE SKIN 2 TIMES A DAY, Disp: 300 each, Rfl: 2    lisinopril  (ZESTRIL ) 40 MG tablet, TAKE 1 TABLET BY MOUTH DAILY, Disp: 90 tablet, Rfl: 0    timolol  (TIMOPTIC ) 0.5 % ophthalmic solution, Place 1 drop into both eyes daily, Disp: , Rfl:

## 2023-12-19 NOTE — Progress Notes (Signed)
 Amb[facility]  [facility]:  [address], [place], [place] [address]  T [phone]  F [phone]  Wound Follow-up - Nursing Documentation    PATIENT: [name] DOB: September 26, 1955   MR #: [id number]  AGE: 68 y.o.    REFERRING MD:  Jacob Harlon GRADE, MD PRIMARY MD: Jacob Harlon GRADE, MD      Chief Complaint   Patient presents with    Wound Check    Edema        Assessment & Plan     Case Management:  Patient identification verified using two identifiers.  This patient is not a fall risk.  RN reconciled meds and allergies.        Treatment:  Wound 11/14/23 Vascular Ulcer Lower Leg Distal;Right;Medial-Dressing:  (Prima collagen, UrgotulAg,  ABD to bolster anterior ankle, and coban 2)     RTC: 1 week    Wound 11/14/23 Vascular Ulcer Lower Leg Distal;Right;Medial-Dressing Change Freq: 1x/week    Compression  RLE Treatment: RLE Multilayer Coban 2        Procedures:  Additional Wound Procedures    Performed by: Miranda Pao, RN  Authorized by: [name], DPM    Body area:  Lower extremity  Lower extremity location:  R lower leg    Compression wrap type: multi-layer    Wrap location: right    Wrap location equal debrided location?: yes    Debrided location: right    Multi-layer: coban 2     Procedure was performed in a clean field. The leg was washed with soap and water and then patted dry. Moisturizer was applied to the leg. A multi-layer compression bandage was applied per manufacturer's instructions. Patient tolerated procedure well and without complaints of pain or discomfort. Patient was educated regarding signs and symptoms of overcompression, including unrelieved pain, toes turning red or purple, or numbness in foot.  Patient was instructed to manually remove outer layer (without scissors) and contact clinic immediately.        Patient Education:  Elevate legs for 30 mins, 3x/day, above the level of the heart. Above the level of the heart, is key here. The  easiest way to achieve this is to lay down flat and prop your legs up with at least 2 pillows.If wraps get wet/damaged, or if they're too tight, or if s/he has any questions: please call the [facility]. We are open M-F, 8:30-4:30, closed on weekends. If patient needs care during our off hours, please don't hesitate and go to Urgent Care or Emergency Room. Provided Pt with a printed handout regarding compressing wraps, and to never use scissors to remove wraps if too tight. Patient verbalized understanding.    [name], RN   12/19/2023

## 2023-12-22 HISTORY — PX: VASCULAR SURGERY: SHX849

## 2023-12-26 ENCOUNTER — Other Ambulatory Visit (INDEPENDENT_AMBULATORY_CARE_PROVIDER_SITE_OTHER): Payer: Self-pay | Admitting: Internal Medicine

## 2023-12-26 ENCOUNTER — Ambulatory Visit: Attending: Foot & Ankle Surgery | Admitting: Foot & Ankle Surgery

## 2023-12-26 VITALS — BP 148/82 | Temp 98.2°F

## 2023-12-26 DIAGNOSIS — E113399 Type 2 diabetes mellitus with moderate nonproliferative diabetic retinopathy without macular edema, unspecified eye: Secondary | ICD-10-CM

## 2023-12-26 DIAGNOSIS — I83013 Varicose veins of right lower extremity with ulcer of ankle: Secondary | ICD-10-CM | POA: Insufficient documentation

## 2023-12-26 DIAGNOSIS — L97312 Non-pressure chronic ulcer of right ankle with fat layer exposed: Secondary | ICD-10-CM | POA: Insufficient documentation

## 2023-12-26 DIAGNOSIS — R600 Localized edema: Secondary | ICD-10-CM | POA: Insufficient documentation

## 2023-12-26 NOTE — Progress Notes (Signed)
 Ambulatory [facility]  [facility]:  [address] [place], [place] [address]  T [phone]  F [phone]  Wound Follow-up - Nursing Documentation    PATIENT: [name] DOB: December 06, 1955   MR #: [id number]  AGE: 68 y.o.    REFERRING MD:  Jacob Harlon GRADE, MD PRIMARY MD: Jacob Harlon GRADE, MD      Chief Complaint   Patient presents with    Edema    Wound Check        Assessment & Plan     Case Management:  Patient identification verified using two identifiers.  This patient has been determined to be at risk for a fall, extra precautionary measures have been taken to ensure their safety.          Treatment:    POC: R Medial Ankle: restore Ag to right medial ankle, ABD to bolster, coban 2, stockinette.    RTC: 1 week        Procedures:  Additional Wound Procedures    Performed by: Wilfred Alpers, RN  Authorized by: Jerona Rosina SAILOR, DPM    Body area:  Lower extremity  Lower extremity location:  R lower leg    Compression wrap type: multi-layer    Wrap location: right    Wrap location equal debrided location?: yes    Debrided location: right    Multi-layer: coban 2     Procedure was performed in a clean field. The leg was washed with soap and water and then patted dry. Moisturizer was applied to the leg. A multi-layer compression bandage was applied per manufacturer's instructions. Patient tolerated procedure well and without complaints of pain or discomfort. Patient was educated regarding signs and symptoms of overcompression, including unrelieved pain, toes turning red or purple, or numbness in foot.  Patient was instructed to manually remove outer layer (without scissors) and contact clinic immediately.       Patient Education:  If wraps get wet/damaged, or if they're too tight, or if s/he has any questions: please call the [facility]. We are open M-F, 8:30-4:30, closed on weekends. If patient needs care during our off hours, please don't hesitate and go to Urgent Care or  Emergency Room. Pt never to use scissors to remove wraps if too tight. Patient verbalized understanding.    [place], RN   12/26/2023

## 2023-12-26 NOTE — Progress Notes (Signed)
 [facility]    Provider Documentation    The patient was seen in the office today and a follow up evaluation was performed, including a history & focused exam.     History of Present Illness Initial Visit   [name] is a 67 y.o. 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  Has known history of venous insufficiency and has had history of multiple venous procedures done.  Has been seen at a vein clinic outside of the [place] network.  According to the patient, ulcer has been present for the past 3 months.  Was referred to us  by her primary care provider, Dr. Jacob.     Previous treatments include applying bacitracin to the wound site, applying it 3 times a day and covering it with a bandage.  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  Her daughter who is accompanying her during today's visit states that she does not do much other than walk around, stating that her partner that works with her does more of the heavy workload.    Pertinent Wound Care History   Has been able to elevate her legs during work hours.     Interval History 12/26/2023:    she reports: tolerating treatment well, no new complaints, denies constitutional symptoms. Prisma applied last week to ulcer site on the RLE. Dressings are dry, clean and intact. Currently wrapped in a coban 2 compression dressing.    Past Medical / Surgical / Family / Social History:     Medical History[1]  Past Surgical History[2]   Family History[3]  Social History[4]    Allergies and Home Medications:     Allergies[5]  Current Medications[6]    Physical Exam     Vitals:    12/26/23 1601   BP: 148/82   Temp: 98.2 F (36.8 C)   SpO2: 97%       There is no height or weight on file to calculate BMI.    General Appearance - alert, well appearing and in no distress  Mental Status - alert,  oriented to person, place and time  Vascular - See wound care exam  Skin - see wound care exam    NV Exam Obtained on 11/14/23  Right DP Pulse : Heard via doppler: Biphasic  Right PT Pulse : Heard via doppler: Triphasic  Left DP Pulse : Heard via doppler: Biphasic  Left PT Pulse : Heard via doppler: Triphasic  Left foot protective sensation impaired  Right foot protective sensation impaired    Focused Wound Care Exam   Wound Assessment:   Wound 11/14/23 Other (Comment) Edema Management Lower Leg Right;Lower (Active)   Date First Assessed/Time First Assessed: 11/14/23 1503   Primary Wound Type: Other (Comment)  Secondary Wound Type - Other: Edema Management  Location: Lower Leg  Wound Location Orientation: Right;Lower  Present on Original Admission: Yes      Assessments 12/26/2023  3:51 PM   Wound Image     Treatments Cleansed   Topical Hyphochlorous Acid;Moisturizing cream       Wound 11/14/23 Vascular Ulcer Lower Leg Distal;Right;Medial (Active)   Date First Assessed/Time First Assessed: 11/14/23 1503   Primary Wound Type: Vascular Ulcer  Location: Lower Leg  Wound Location Orientation: Distal;Right;Medial  Present on Original Admission: Yes      Assessments 12/26/2023  3:52 PM  12/26/2023  4:06 PM   Wound Image       Wound Base Description Pink;Peeling;Partial Thickness --   Peri-wound Description Peeling/Flaking;Fragile;Dry --   Wound Length (cm) 0.9 cm --   Wound Width (cm) 0.8 cm --   Wound Depth (cm) 0.1 cm --   Wound Surface Area (cm^2) 0.57 cm^2 --   Wound Volume (cm^3) 0.038 cm^3 --   Closure Open --   Drainage Amount None --   Tunneling No --   Undermining  No --   Margins Open;Attached edges --   Treatments Cleansed --   Compression Wrap 1 Coban 2 or equivalent --   Topical Hyphochlorous Acid --   Dressing Change Freq 1x/week --       Edema  RLE Edema: Non-pitting  R Foot Measurements(cm): 21.5 cm  R Ankle Measurements(cm): 21.5 cm  R Calf Measurements(cm): 34.5 cm                            Labs and  Imaging Reviewed   I have reviewed pertinent labs, imaging and cultures.    Assessment & Plan   I examined the patient for an wound(s) that has been resistant to healing despite numerous interventions. The plan is to now examine any underlying characteristics that may impede healing. I decided that the wounds are not self limited and significantly impact the patient's daily living.    Wound Assessment and Plan:  The wounds are assessed as followed: healed.  There were no signs of acute clinical or systemic infection, including erythema, warmth, induration, fluctuance, tenderness to palpation, purulence or malodor.  Procedures, if indicated based on today's evaluation of the patient and ulcer/wound characteristics, are detailed below.    Wound Care Plan:  Advanced Wound dressings appropriate to the needs of the wound have been initiated to provide a microenvironment which can enhance healing including maintaining a moist environment, decreasing bioburden and insulating and protecting the wound.   The treatment plan will change  as stated in nursing notes.   An antimicrobial dressing was applied as the primary dressing for control of possible excess bioburden : Restore Ag.    Edema:    I reviewed the circumference measurements. The edema is improving and the management plan will change.  The following edema recommendations have been made: Patient instructed to elevate the lower extremities above the level of the heart when possible.   Active compression with the use of Coban 2.     Vascular Status:   The patient's vascular status was assessed and is adequate for healing.     Nutrition:    Nutrition status is inadequate.  I made suggestions regarding appropriate protein intake, vitamin supplementation, and adequate hydration. Recommendations include Juven- Arginaid amino acid supplementation, high protein shake , healthy diet, and optimized control of blood sugar as elevated A1c impairs wound healing    Other  comorbidities that may affect wound healing: Non-healing wounds are a symptom of their significant comorbid diseases. Diabetes, neuropathy, Venous insufficiency, and edema    Coordination of Care: I recommended ongoing care with PCP Dr. Jacob    Patient Education:  The patient was educated on: Dressing management, optimization of nutrition, signs and symptoms of infection , call the clinic with any concerns, and strict ER precautions   Patient and daughter verbalized understanding and agreed with POC.   Home health was not indicated at this time.  Will wrap for 1 more week  to protect newly healed ulcer site.    The patient will continue to be seen on a regular basis.  Follow up in clinic in 1 week and PRN interval nurse visits for dressing changes.    The patient's condition required a significant, identifiable E/M service above and beyond the other service provided or services beyond the usual preoperative and postoperative care associated with the procedure that was performed.     Procedure Note   Procedures - none indicated, ulcer now healed.          [1]   Past Medical History:  Diagnosis Date    Congenital anomaly of heart     Diabetes mellitus type 2 in obese     Diabetic retinopathy (CMS/HCC) 06/28/2013    Of the right eye, per patient    Glaucoma 06/01/2012    Hyperlipidemia     Hypertension     Obesity     Venous insufficiency 09/17/2014    Followed at [facility].  Wears compression stockings.    [2]   Past Surgical History:  Procedure Laterality Date    BREAST CYST EXCISION  1993    bilateral    TUBAL LIGATION     [3]   Family History  Problem Relation Name Age of Onset    Heart disease Mother      Diabetes Mother      Hypertension Mother      Hyperlipidemia Mother      Heart attack Mother      Heart disease Father      Diabetes Father      Hypertension Father      Hyperlipidemia Father      Cirrhosis Father      Diabetes Sister      Liver cancer Maternal Aunt      Cancer Maternal Uncle           liver   [4]   Social History  Tobacco Use    Smoking status: Never     Passive exposure: Never    Smokeless tobacco: Never   Vaping Use    Vaping status: Never Used   Substance Use Topics    Alcohol use: Never    Drug use: No   [5]   Allergies  Allergen Reactions    Shellfish Protein-Containing Drug Products    [6]   Current Outpatient Medications:     amLODIPine  (NORVASC ) 5 MG tablet, Take 1 tablet (5 mg) by mouth once daily, Disp: , Rfl:     atorvastatin  (LIPITOR) 40 MG tablet, Take 1 tablet (40 mg) by mouth every evening, Disp: 90 tablet, Rfl: 3    Blood Glucose Monitoring Suppl (ACCU-CHEK AVIVA PLUS) w/Device Kit, Check blood glucose level twice daily. ICD 10 Code E11.9, Z79.4, Disp: 1 kit, Rfl: 1    bumetanide  (BUMEX ) 2 MG tablet, Take 1 tablet (2 mg) by mouth Once every Monday, Wednesday and Friday morning, Disp: , Rfl:     carbamide peroxide (DEBROX) 6.5 % otic solution, Place 5 drops into the right ear 2 (two) times daily, Disp: 15 mL, Rfl: 2    carvedilol  (COREG ) 12.5 MG tablet, Take 0.5 tablets (6.25 mg) by mouth 2 (two) times daily with meals, Disp: , Rfl:     dabigatran  (PRADAXA ) 150 MG Cap, Take 1 capsule (150 mg) by mouth every 12 (twelve) hours, Disp: 180 capsule, Rfl: 1    empagliflozin  (Jardiance ) 25 MG tablet, Take 1 tablet (25 mg) by mouth every  morning, Disp: 90 tablet, Rfl: 3    glucose blood (Accu-Chek Aviva) test strip, Check blood glucose level twice daily. ICD 10 Code E11.9, Z79.4, Disp: 200 each, Rfl: 2    insulin  NPH (NovoLIN  N) 100 UNIT/ML injection, INJECT 30 UNITS SUBCUTANEOUSLY WITH BREAKFST AND 35  UNITS WITH DINNERI, Disp: 60 mL, Rfl: 3    Insulin  Syringe-Needle U-100 (BD INSULIN  SYRINGE ULTRAFINE) 31G X 5/16 1 ML Misc, ADMINSTER INSULIN  UNDER THE SKIN 2 TIMES A DAY, Disp: 300 each, Rfl: 2    lisinopril  (ZESTRIL ) 40 MG tablet, TAKE 1 TABLET BY MOUTH DAILY, Disp: 90 tablet, Rfl: 0    timolol  (TIMOPTIC ) 0.5 % ophthalmic solution, Place 1 drop into both eyes daily, Disp: , Rfl:

## 2023-12-27 ENCOUNTER — Other Ambulatory Visit (INDEPENDENT_AMBULATORY_CARE_PROVIDER_SITE_OTHER): Payer: Self-pay | Admitting: Internal Medicine

## 2023-12-27 DIAGNOSIS — E1129 Type 2 diabetes mellitus with other diabetic kidney complication: Secondary | ICD-10-CM

## 2024-01-02 ENCOUNTER — Ambulatory Visit: Attending: Foot & Ankle Surgery | Admitting: Foot & Ankle Surgery

## 2024-01-02 ENCOUNTER — Encounter (INDEPENDENT_AMBULATORY_CARE_PROVIDER_SITE_OTHER): Payer: Self-pay | Admitting: Internal Medicine

## 2024-01-02 VITALS — BP 141/70 | HR 63 | Temp 97.4°F | Resp 14 | Ht 65.0 in | Wt 179.0 lb

## 2024-01-02 DIAGNOSIS — L97312 Non-pressure chronic ulcer of right ankle with fat layer exposed: Secondary | ICD-10-CM | POA: Insufficient documentation

## 2024-01-02 DIAGNOSIS — R600 Localized edema: Secondary | ICD-10-CM | POA: Insufficient documentation

## 2024-01-02 DIAGNOSIS — I872 Venous insufficiency (chronic) (peripheral): Secondary | ICD-10-CM | POA: Insufficient documentation

## 2024-01-02 DIAGNOSIS — I83013 Varicose veins of right lower extremity with ulcer of ankle: Secondary | ICD-10-CM | POA: Insufficient documentation

## 2024-01-02 NOTE — Progress Notes (Signed)
 [facility]    Provider Documentation    The patient was seen in the office today and a follow up evaluation was performed, including a history & focused exam.     History of Present Illness Initial Visit   [name] is a 68 y.o. 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  Has known history of venous insufficiency and has had history of multiple venous procedures done.  Has been seen at a vein clinic outside of the [facility] network.  According to the patient, ulcer has been present for the past 3 months.  Was referred to us  by her primary care provider, Dr. Jacob.     Previous treatments include applying bacitracin to the wound site, applying it 3 times a day and covering it with a bandage.  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  Her daughter who is accompanying her during today's visit states that she does not do much other than walk around, stating that her partner that works with her does more of the heavy workload.    Pertinent Wound Care History   Has been able to elevate her legs during work hours.     Interval History 01/02/2024:    she reports: tolerating treatment well, no new complaints, denies constitutional symptoms. Ulcer recently healed, but decided to keep with compression therapy for 1 more week.     Past Medical / Surgical / Family / Social History:     Medical History[1]  Past Surgical History[2]   Family History[3]  Social History[4]    Allergies and Home Medications:     Allergies[5]  Current Medications[6]    Physical Exam     Vitals:    01/02/24 1556   BP: 141/70   Pulse: 63   Resp: 14   Temp: 97.4 F (36.3 C)   SpO2: 98%       Body mass index is 29.79 kg/m.    General Appearance - alert, well appearing and in no distress  Mental Status - alert, oriented to person, place and time  Vascular - See wound  care exam  Skin - see wound care exam    NV Exam Obtained on 11/14/23  Right DP Pulse : Heard via doppler: Biphasic  Right PT Pulse : Heard via doppler: Triphasic  Left DP Pulse : Heard via doppler: Biphasic  Left PT Pulse : Heard via doppler: Triphasic  Left foot protective sensation impaired  Right foot protective sensation impaired    Focused Wound Care Exam   Wound Assessment:   Wound 11/14/23 Other (Comment) Edema Management Lower Leg Right;Lower (Active)   Date First Assessed/Time First Assessed: 11/14/23 1503   Primary Wound Type: Other (Comment)  Secondary Wound Type - Other: Edema Management  Location: Lower Leg  Wound Location Orientation: Right;Lower  Present on Original Admission: Yes      Assessments 01/02/2024  4:03 PM   Wound Image         Edema  RLE Edema: Pitting; 1+ barely detectable  R Foot Measurements(cm): 22.5 cm  R Ankle Measurements(cm): 20 cm  R Calf Measurements(cm): 35 cm  RLE Treatment: RLE Compression garment                            Labs and Imaging Reviewed  I have reviewed pertinent labs, imaging and cultures.    Assessment & Plan   I examined the patient for an wound(s) that has been resistant to healing despite numerous interventions. The plan is to now examine any underlying characteristics that may impede healing. I decided that the wounds are not self limited and significantly impact the patient's daily living.    Wound Assessment and Plan:  The wounds are assessed as followed: healed.  There were no signs of acute clinical or systemic infection, including erythema, warmth, induration, fluctuance, tenderness to palpation, purulence or malodor.  Procedures, if indicated based on today's evaluation of the patient and ulcer/wound characteristics, are detailed below.    Wound Care Plan:  Advanced Wound dressings appropriate to the needs of the wound have been initiated to provide a microenvironment which can enhance healing including maintaining a moist environment, decreasing  bioburden and insulating and protecting the wound.   The treatment plan will change  as stated in nursing notes.   Hydroguard applied to intact skin.   Bordered foam applied to newly healed ulcer site. Leave intact for 1 week, then remove.     Edema:    I reviewed the circumference measurements. The edema is improving and the management plan will change.  The following edema recommendations have been made: - Patient instructed to elevate the lower extremities above the heart 3x daily.    - Active compression with compression stockings    Vascular Status:   The patient's vascular status was assessed and is adequate for healing.     Nutrition:    Nutrition status is inadequate.  I made suggestions regarding appropriate protein intake, vitamin supplementation, and adequate hydration. Recommendations include Juven- Arginaid amino acid supplementation, high protein shake , healthy diet, and optimized control of blood sugar as elevated A1c impairs wound healing    Other comorbidities that may affect wound healing: Non-healing wounds are a symptom of their significant comorbid diseases. Diabetes, neuropathy, Venous insufficiency, and edema    Coordination of Care: I recommended ongoing care with PCP Dr. Jacob    Patient Education:  The patient was educated on: Dressing management, optimization of nutrition, signs and symptoms of infection , call the clinic with any concerns, and strict ER precautions   Patient and daughter verbalized understanding and agreed with POC.   Home health was not indicated at this time.    The patient was discharged with detailed written and verbal instructions including to call with any skin breakdown.     The patient's condition required a significant, identifiable E/M service above and beyond the other service provided or services beyond the usual preoperative and postoperative care associated with the procedure that was performed.     Procedure Note   Procedures - none indicated, ulcer now  healed.            [1]   Past Medical History:  Diagnosis Date    Congenital anomaly of heart     Diabetes mellitus type 2 in obese     Diabetic retinopathy (CMS/HCC) 06/28/2013    Of the right eye, per patient    Glaucoma 06/01/2012    Hyperlipidemia     Hypertension     Obesity     Venous insufficiency 09/17/2014    Followed at [facility].  Wears compression stockings.    [2]   Past Surgical History:  Procedure Laterality Date    BREAST CYST EXCISION  1993    bilateral  TUBAL LIGATION     [3]   Family History  Problem Relation Name Age of Onset    Heart disease Mother      Diabetes Mother      Hypertension Mother      Hyperlipidemia Mother      Heart attack Mother      Heart disease Father      Diabetes Father      Hypertension Father      Hyperlipidemia Father      Cirrhosis Father      Diabetes Sister      Liver cancer Maternal Aunt      Cancer Maternal Uncle          liver   [4]   Social History  Tobacco Use    Smoking status: Never     Passive exposure: Never    Smokeless tobacco: Never   Vaping Use    Vaping status: Never Used   Substance Use Topics    Alcohol use: Never    Drug use: No   [5]   Allergies  Allergen Reactions    Shellfish Protein-Containing Drug Products    [6]   Current Outpatient Medications:     amLODIPine  (NORVASC ) 5 MG tablet, Take 1 tablet (5 mg) by mouth once daily, Disp: , Rfl:     atorvastatin  (LIPITOR) 40 MG tablet, Take 1 tablet (40 mg) by mouth every evening, Disp: 90 tablet, Rfl: 3    Blood Glucose Monitoring Suppl (ACCU-CHEK AVIVA PLUS) w/Device Kit, Check blood glucose level twice daily. ICD 10 Code E11.9, Z79.4, Disp: 1 kit, Rfl: 1    bumetanide  (BUMEX ) 2 MG tablet, Take 1 tablet (2 mg) by mouth Once every Monday, Wednesday and Friday morning, Disp: , Rfl:     carbamide peroxide (DEBROX) 6.5 % otic solution, Place 5 drops into the right ear 2 (two) times daily, Disp: 15 mL, Rfl: 2    carvedilol  (COREG ) 12.5 MG tablet, Take 0.5 tablets (6.25 mg) by mouth 2  (two) times daily with meals, Disp: , Rfl:     dabigatran  (PRADAXA ) 150 MG Cap, Take 1 capsule (150 mg) by mouth every 12 (twelve) hours, Disp: 180 capsule, Rfl: 1    empagliflozin  (Jardiance ) 25 MG tablet, Take 1 tablet (25 mg) by mouth every morning, Disp: 90 tablet, Rfl: 3    glucose blood (Accu-Chek Aviva) test strip, Check blood glucose level twice daily. ICD 10 Code E11.9, Z79.4, Disp: 200 each, Rfl: 2    insulin  NPH (NovoLIN  N) 100 UNIT/ML injection, INJECT 30 UNITS SUBCUTANEOUSLY WITH BREAKFST AND 35  UNITS WITH DINNERI, Disp: 60 mL, Rfl: 3    Insulin  Syringe-Needle U-100 (BD Insulin  Syringe Ultrafine) 31G X 5/16 1 ML Misc, USES 1 SYRINGE 2 TIMES A DAY, Disp: 180 each, Rfl: 0    lisinopril  (ZESTRIL ) 40 MG tablet, TAKE 1 TABLET BY MOUTH DAILY, Disp: 90 tablet, Rfl: 0    timolol  (TIMOPTIC ) 0.5 % ophthalmic solution, Place 1 drop into both eyes daily, Disp: , Rfl:

## 2024-01-02 NOTE — Patient Instructions (Signed)
 DISCHARGE INSTRUCTIONS  [facility]:  [address], [place], [place] [address]  T [phone]  F [phone]    PATIENT: [name] DOB: March 30, 1955   MR #: [id number]  AGE: 68 y.o.    REFERRING MD:  Jacob Harlon GRADE, MD PRIMARY MD: Jacob Harlon GRADE, MD      January 02, 2024      Thank you for choosing the [facility] for your [facility] care needs.  To insure your continued recovery, it is important that you maintain certain aspects of your care at home.    The healed area is more vulnerable to reinjury/ulceration.  Keep the area protected with a wound dressing for 1-2 weeks  After removal of the wound dressing, moisturize the area daily    Call the clinic if:  The area becomes red, hot, swollen and is draining  You notice additional skin breakdown that does not resolve in 3 weeks    Other instructions:    - Continue elevation:  Elevate your legs above the level of your heart several times a day.  - Continue edema control:  Apply recommended compression socks or stockings every morning.  Keep them on until you go to bed and ensure there is no wrinkling during the day.  Replace your compression garments every 6 months.  - Continue wound care:  Keep bordered foam in place for 1 week remove afterwards. Make sure to moisturize daily and watch area for any leakage.       Please contact us  if you have any questions, if you show signs that the wound is returning, or if you develop new wounds.      Clinician: Karna Sprung, RN

## 2024-01-02 NOTE — Progress Notes (Signed)
 Amb[facility]  [facility]:  [address], [place], [place] [address]  T [phone]  F [phone]  Wound Follow-up - Nursing Documentation    PATIENT: [name] DOB: 09-03-1955   MR #: [id number]  AGE: 68 y.o.    REFERRING MD:  Jacob Harlon GRADE, MD PRIMARY MD: Jacob Harlon GRADE, MD      Chief Complaint   Patient presents with    Chronic Ulcer    Edema        Assessment & Plan     Case Management:  Patient identification verified using two identifiers.  This patient has been determined to be at risk for a fall, extra precautionary measures have been taken to ensure their safety.     Pt arrived accompanied by daughter. Last dressing change in clinic 1 wk ago.       **Pt Discharged**  Treatment:  [REMOVED] Wound 11/14/23 Distal;Right;Medial Vascular Ulcer Lower Leg-Dressing:  (Keep bordered foam in place for 1 week remove afterwards, use moisturizer, ota)   [REMOVED] Wound 11/14/23 Distal;Right;Medial Vascular Ulcer Lower Leg-Dressing Change Freq: Daily  RTC: PRN  Compression  RLE Treatment: RLE Compression garment                   Patient Education:  - Continue elevation:  Elevate your legs above the level of your heart several times a day.  - Continue edema control:  Apply recommended compression socks or stockings every morning.  Keep them on until you go to bed and ensure there is no wrinkling during the day.  Replace your compression garments every 6 months.  - Continue wound care:  Keep bordered foam in place for 1 week remove afterwards. Make sure to moisturize daily and watch area for any leakage.     Karna Sprung, RN   01/02/2024

## 2024-01-12 ENCOUNTER — Telehealth (INDEPENDENT_AMBULATORY_CARE_PROVIDER_SITE_OTHER): Payer: Self-pay

## 2024-01-12 NOTE — Telephone Encounter (Signed)
 Regarding DOS 12/11: per chart review, pt has SLGT-2 empagliflozin  (Jardiance ) on medication list. Per anesthesia guidelines, SGLT-2 needs to be held for 3 days before DOS. Outgoing call attempt made with [employer] interpreter Eric ID# [id number] at listed mobile #; call rings repeatedly, no answer, voicemail box full and unable to leave a voice message. Interpreter stated he was able to leave a voice message with the above instructions at pt's listed home #. Also requested pt call back [facility] [phone] to complete phone interview.

## 2024-01-15 ENCOUNTER — Ambulatory Visit (INDEPENDENT_AMBULATORY_CARE_PROVIDER_SITE_OTHER)

## 2024-01-15 ENCOUNTER — Encounter (INDEPENDENT_AMBULATORY_CARE_PROVIDER_SITE_OTHER): Payer: Self-pay

## 2024-01-15 NOTE — PSS Phone Screening (Signed)
 Pre-Anesthesia Evaluation    Pre-op phone visit requested by:   Reason for pre-op phone visit: Patient anticipating LENSX RIGHT EXTRACTION, CATARACT, EXTRACAPSULAR, INSERTION INTRAOCULAR LENS procedure.    01/01/24 Card preop Clr w/ EKG (media)  12/15/23 pt, bmp, cbc  11/10/23 A1c  08/2022 echo     No orders of the defined types were placed in this encounter.      History of Present Illness/Summary:      Problem List:  Medical Problems       Hospital Problem List  Date Reviewed: 01/02/2024   None        Non-Hospital Problem List  Date Reviewed: 01/02/2024          ICD-10-CM Priority Class Noted Diagnosed    Glaucoma H40.9   06/01/2012     Type 2 diabetes mellitus without complication, with long-term current use of insulin  (CMS/HCC) E11.9, Z79.4   Unknown     Hypertension I10   Unknown     Hyperlipidemia E78.5   Unknown     Fatty liver K76.0   06/18/2012     Overview Addendum 11/23/2016 12:33 PM by Cleotha Hadassah BROCKS, MD   Seen on liver US  done July 2017         Non-rheumatic mitral regurgitation I34.0   02/14/2014     Overview Signed 02/14/2014 12:05 PM by Cleotha Hadassah BROCKS, MD   Followed by Dr. Katrine (Cardiology)         Venous insufficiency I87.2   09/17/2014     Overview Signed 09/17/2014  1:00 PM by Cleotha Hadassah BROCKS, MD   Followed at [facility].  Wears compression stockings.          Uterovaginal prolapse, complete N81.3   09/11/2020     Overview Signed 09/11/2020 10:53 AM by Georgena Raisin   Added automatically from request for surgery [phone]         CAD (coronary artery disease) I25.10   10/14/2020     Atrial fibrillation (CMS/HCC) I48.91   10/14/2020     Diastolic heart failure (CMS/HCC) I50.30   10/14/2020     Nodule of right lung R91.1   10/14/2020     Overview Addendum 10/14/2020  6:10 PM by Carey Amel, PA   nodule seen on right upper lung field, which on CT scan appears to be scarring 2021. Followed by PCP.           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4 (11/10/23), fbs 90'-150' at home    Diabetic retinopathy (CMS/HCC) 06/28/2013    Of the right eye, per patient    Gastroesophageal reflux disease     Glaucoma 06/01/2012    Hyperlipidemia     Hypertension     Obesity     Seasonal allergic rhinitis     Shellfish    Venous insufficiency 09/17/2014    Followed at [facility].  Wears compression stockings.      Past Surgical History[1]     Medication List            Accurate as of January 15, 2024  2:02 PM. Always use your most recent med list.                Accu-Chek Aviva Plus w/Device Kit  Check blood glucose level twice daily. ICD 10 Code E11.9, Z79.4      amLODIPine  5 MG tablet  Take 1 tablet (5 mg) by mouth once daily  Commonly known as: NORVASC   Medication Adjustments for Surgery: Take morning of surgery     atorvastatin  40 MG tablet  Take 1 tablet (40 mg) by mouth every evening  Commonly known as: LIPITOR  Medication Adjustments for Surgery: Take as prescribed     BD Insulin  Syringe Ultrafine 31G X 5/16 1 ML Misc  USES 1 SYRINGE 2 TIMES A DAY  Generic drug: Insulin  Syringe-Needle U-100     bumetanide  2 MG tablet  Take 1 tablet (2 mg) by mouth Once every Monday, Wednesday and Friday morning  Commonly known as: BUMEX   Medication Adjustments for Surgery: Take as prescribed     carbamide peroxide 6.5 % otic solution  Place 5 drops into the right ear 2 (two) times daily  Commonly known as: DEBROX  Notes to patient: Not taking     carvedilol  12.5 MG tablet  Take 0.5 tablets (6.25 mg) by mouth 2 (two) times daily with meals  Commonly known as: COREG   Medication Adjustments for Surgery: Take morning of surgery     dabigatran  150 MG Caps  Take 1 capsule (150 mg) by mouth every 12 (twelve) hours  Commonly known as: PRADAXA   Medication Adjustments for Surgery: Take morning of surgery     empagliflozin  25 MG tablet  Take 1 tablet (25 mg) by mouth every morning  Commonly known as: Jardiance   Medication Adjustments for Surgery: Stop 3 days before surgery     glucose blood test strip  Check blood glucose level twice daily. ICD 10 Code E11.9, Z79.4  Commonly known as: Accu-Chek Aviva     lisinopril  40 MG tablet  TAKE 1 TABLET BY MOUTH DAILY  Commonly known as: ZESTRIL   Medication  Adjustments for Surgery: Take morning of surgery     NovoLIN  N 100 UNIT/ML injection  INJECT 30 UNITS SUBCUTANEOUSLY WITH BREAKFST AND 35  UNITS WITH DINNERI  Generic drug: insulin  NPH  Notes to patient: Take 1/3 dose DOS     timolol  0.5 % ophthalmic solution  Place 1 drop into both eyes daily  Commonly known as: TIMOPTIC   Medication Adjustments for Surgery: Take as prescribed             Allergies[2]  Family History[3]  Social History     Occupational History    Occupation: Maintenance     Employer: [employer]   Tobacco Use    Smoking status: Never     Passive exposure: Never    Smokeless tobacco: Never   Vaping Use    Vaping status: Never Used   Substance and Sexual Activity    Alcohol use: Never    Drug use: No    Sexual activity: Not on file       Menstrual History:   LMP / Status  Postmenopausal     No LMP recorded. Patient is postmenopausal.    Tubal Ligation?  No valid surgical or medical questions entered.               Exam Scores:   SDB score  OSA Risk Category: No Risk        STBUR score       PONV score  Nausea Risk: MODERATE RISK    MST score  MST Score: 0    PEN-FAST score       Frailty score  CFS Score: 4    CHADsVasc            Visit Vitals  Ht 1.651 m (5' 5)   Wt 81.2 kg (179 lb)   BMI 29.79 kg/m   Overweight based on BMI.    Recent Labs   CBC (last 180 days) 10/17/23  0845 12/15/23  1456   WBC 8.56 8.32   RBC 4.99 4.92   Hemoglobin 14.9* 14.7   Hematocrit 45.5* 44.6*   MCV 91.2 90.7   MCH 29.9 29.9   MCHC 32.7 33.0   RDW 14 14   Platelet Count 237 218   MPV 12.8* 12.4   nRBC % 0.0 0.0   Absolute nRBC 0.00 0.00     Recent Labs   BMP (last 180 days) 11/10/23  0934 12/15/23  1456   Glucose 78 212*   BUN 19 22*   Creatinine 1.0 1.3*   Sodium 144 141   Potassium 4.4 4.1   Chloride 105 104   CO2 30* 28   Calcium  9.2 9.2   Anion Gap 9.0 9.0   GFR >60.0 44.3*     Recent Labs   Coag Panel (last 180 days) 10/17/23  0845 12/15/23  1456   PT 14.9* 13.9*   INR 1.3* 1.2*     Recent Labs   Other (last 180 days) 07/25/23  1121 11/03/23  1319 11/10/23  0934   TSH 0.70  --   --    Bilirubin, Total 0.4  --  0.6   ALT 21  --  19   AST (SGOT) 20  --  21   Protein, Total 7.7  --  7.1   Hemoglobin A1C 9.0*  --  8.4*   SARS-CoV-2 (COVID-19) RNA  --  Not Detected  --                         [  1]   Past Surgical History:  Procedure Laterality Date    BREAST CYST EXCISION  02/08/1991    bilateral    TUBAL  LIGATION      VASCULAR SURGERY  12/22/2023    leg vein   [2]   Allergies  Allergen Reactions    Shellfish Protein-Containing Drug Products Anaphylaxis   [3]   Family History  Problem Relation Name Age of Onset    Heart disease Mother      Diabetes Mother      Hypertension Mother      Hyperlipidemia Mother      Heart attack Mother      Heart disease Father      Diabetes Father      Hypertension Father      Hyperlipidemia Father      Cirrhosis Father      Diabetes Sister      Liver cancer Maternal Aunt      Cancer Maternal Uncle          liver

## 2024-01-18 ENCOUNTER — Ambulatory Visit: Admitting: Pediatric Anesthesiology

## 2024-01-18 ENCOUNTER — Encounter: Admission: RE | Disposition: A | Payer: Self-pay

## 2024-01-18 ENCOUNTER — Ambulatory Visit
Admission: RE | Admit: 2024-01-18 | Discharge: 2024-01-18 | Disposition: A | Discharge status: Home / Self Care | Source: Ambulatory Visit

## 2024-01-18 DIAGNOSIS — N289 Disorder of kidney and ureter, unspecified: Secondary | ICD-10-CM | POA: Insufficient documentation

## 2024-01-18 DIAGNOSIS — R7989 Other specified abnormal findings of blood chemistry: Secondary | ICD-10-CM | POA: Insufficient documentation

## 2024-01-18 DIAGNOSIS — I5030 Unspecified diastolic (congestive) heart failure: Secondary | ICD-10-CM | POA: Insufficient documentation

## 2024-01-18 DIAGNOSIS — R9431 Abnormal electrocardiogram [ECG] [EKG]: Secondary | ICD-10-CM | POA: Insufficient documentation

## 2024-01-18 DIAGNOSIS — E1136 Type 2 diabetes mellitus with diabetic cataract: Secondary | ICD-10-CM | POA: Insufficient documentation

## 2024-01-18 DIAGNOSIS — N2889 Other specified disorders of kidney and ureter: Secondary | ICD-10-CM | POA: Insufficient documentation

## 2024-01-18 DIAGNOSIS — I110 Hypertensive heart disease with heart failure: Secondary | ICD-10-CM | POA: Insufficient documentation

## 2024-01-18 DIAGNOSIS — I2510 Atherosclerotic heart disease of native coronary artery without angina pectoris: Secondary | ICD-10-CM | POA: Insufficient documentation

## 2024-01-18 DIAGNOSIS — I4891 Unspecified atrial fibrillation: Secondary | ICD-10-CM | POA: Insufficient documentation

## 2024-01-18 DIAGNOSIS — E785 Hyperlipidemia, unspecified: Secondary | ICD-10-CM | POA: Insufficient documentation

## 2024-01-18 DIAGNOSIS — I272 Pulmonary hypertension, unspecified: Secondary | ICD-10-CM | POA: Insufficient documentation

## 2024-01-18 DIAGNOSIS — H2511 Age-related nuclear cataract, right eye: Secondary | ICD-10-CM | POA: Diagnosis present

## 2024-01-18 DIAGNOSIS — Z7984 Long term (current) use of oral hypoglycemic drugs: Secondary | ICD-10-CM | POA: Insufficient documentation

## 2024-01-18 DIAGNOSIS — I872 Venous insufficiency (chronic) (peripheral): Secondary | ICD-10-CM | POA: Insufficient documentation

## 2024-01-18 HISTORY — DX: Other seasonal allergic rhinitis: J30.2

## 2024-01-18 HISTORY — DX: Unspecified osteoarthritis, unspecified site: M19.90

## 2024-01-18 HISTORY — PX: EXTRACTION, CATARACT, EXTRACAPSULAR, WITH INTRAOCULAR LENS (IOL) INSERTION: SHX3376

## 2024-01-18 LAB — WHOLE BLOOD GLUCOSE POCT
Whole Blood Glucose POCT: 86 mg/dL (ref 70–100)
Whole Blood Glucose POCT: 75 mg/dL (ref 70–100)

## 2024-01-18 SURGERY — EXTRACTION, CATARACT, EXTRACAPSULAR, INSERTION INTRAOCULAR LENS
Anesthesia: Anesthesia MAC / Sedation | Site: Eye | Laterality: Right | Wound class: Clean

## 2024-01-18 MED ORDER — MOXIFLOXACIN HCL 0.16 % IO SOSY
PREFILLED_SYRINGE | INTRAOCULAR | Status: DC | PRN
  Administered 2024-01-18: .3 mL via INTRACAMERAL

## 2024-01-18 MED ORDER — MIDAZOLAM HCL 1 MG/ML IJ SOLN (WRAP)
INTRAMUSCULAR | Status: AC
  Filled 2024-01-18: qty 2

## 2024-01-18 MED ORDER — PHENYLEPHRINE HCL 2.5 % OP SOLN
OPHTHALMIC | Status: AC
  Administered 2024-01-18: 1 [drp] via OPHTHALMIC
  Filled 2024-01-18: qty 2

## 2024-01-18 MED ORDER — LACTATED RINGERS IV SOLN
INTRAVENOUS | Status: DC
  Filled 2024-01-18: qty 1000

## 2024-01-18 MED ORDER — TETRACAINE HCL 0.5 % OP SOLN
OPHTHALMIC | Status: AC
  Administered 2024-01-18: 1 [drp] via OPHTHALMIC
  Filled 2024-01-18: qty 4

## 2024-01-18 MED ORDER — TRYPAN BLUE 0.06 % IO SOSY
PREFILLED_SYRINGE | INTRAOCULAR | Status: DC | PRN
  Administered 2024-01-18: .5 mL via INTRAOCULAR

## 2024-01-18 MED ORDER — FENTANYL CITRATE (PF) 50 MCG/ML IJ SOLN (WRAP)
INTRAMUSCULAR | Status: AC
  Filled 2024-01-18: qty 2

## 2024-01-18 MED ORDER — OFLOXACIN 0.3 % OP SOLN
OPHTHALMIC | Status: DC | PRN
  Administered 2024-01-18: 2 [drp]

## 2024-01-18 MED ORDER — CYCLOPENTOLATE HCL 1 % OP SOLN
1.0000 [drp] | OPHTHALMIC | Status: AC
  Administered 2024-01-18 (×2): 1 [drp] via OPHTHALMIC

## 2024-01-18 MED ORDER — ACETAMINOPHEN 500 MG PO TABS: 1000.0000 mg | ORAL_TABLET | Freq: Four times a day (QID) | ORAL | Status: DC | PRN

## 2024-01-18 MED ORDER — TRIAMCINOLONE ACETONIDE 40 MG/ML IO SUSP
INTRAOCULAR | Status: DC | PRN
  Administered 2024-01-18: 40 mg via INTRAOCULAR

## 2024-01-18 MED ORDER — CARBACHOL 0.01 % IO SOLN
INTRAOCULAR | Status: DC | PRN
  Administered 2024-01-18: 1 mL via OPHTHALMIC

## 2024-01-18 MED ORDER — OFLOXACIN 0.3 % OP SOLN
OPHTHALMIC | Status: AC
  Administered 2024-01-18: 1 [drp] via OPHTHALMIC
  Filled 2024-01-18: qty 5

## 2024-01-18 MED ORDER — STERILE WATER FOR IRRIGATION IR SOLN
Status: DC | PRN
  Administered 2024-01-18: 200 mL

## 2024-01-18 MED ORDER — TRIAMCINOLONE ACETONIDE 40 MG/ML IO SUSP
INTRAOCULAR | Status: AC
  Filled 2024-01-18: qty 1

## 2024-01-18 MED ORDER — EPINEPHRINE HCL 1 MG/ML IJ SOLN (WRAP): Status: DC | PRN

## 2024-01-18 MED ORDER — BSS IO SOLN
INTRAOCULAR | Status: DC | PRN
  Administered 2024-01-18: 15 [drp]

## 2024-01-18 MED ORDER — OFLOXACIN 0.3 % OP SOLN
1.0000 [drp] | OPHTHALMIC | Status: AC
  Administered 2024-01-18 (×2): 1 [drp] via OPHTHALMIC

## 2024-01-18 MED ORDER — MIDAZOLAM HCL 1 MG/ML IJ SOLN (WRAP)
INTRAMUSCULAR | Status: DC | PRN
  Administered 2024-01-18: 2 mg via INTRAVENOUS

## 2024-01-18 MED ORDER — LIDOCAINE-PHENYLEPHRINE 1-1.5 % IO SOLN
INTRAOCULAR | Status: DC | PRN
  Administered 2024-01-18: 1 mL via INTRAOCULAR

## 2024-01-18 MED ORDER — FENTANYL CITRATE (PF) 50 MCG/ML IJ SOLN (WRAP)
INTRAMUSCULAR | Status: DC | PRN
  Administered 2024-01-18: 50 ug via INTRAVENOUS

## 2024-01-18 MED ORDER — CYCLOPENTOLATE HCL 1 % OP SOLN
OPHTHALMIC | Status: AC
  Administered 2024-01-18: 1 [drp] via OPHTHALMIC
  Filled 2024-01-18: qty 2

## 2024-01-18 MED ORDER — PHENYLEPHRINE HCL 2.5 % OP SOLN
1.0000 [drp] | OPHTHALMIC | Status: AC
  Administered 2024-01-18 (×2): 1 [drp] via OPHTHALMIC

## 2024-01-18 MED ORDER — NEOMYCIN-POLYMYXIN-DEXAMETH 3.5-10000-0.1 OP OINT
TOPICAL_OINTMENT | OPHTHALMIC | Status: DC | PRN
  Administered 2024-01-18: 1 via OPHTHALMIC

## 2024-01-18 MED ORDER — LIDOCAINE HCL 1 % IJ SOLN: 1.0000 mL | Freq: Once | INTRAMUSCULAR | Status: DC | PRN

## 2024-01-18 MED ORDER — TROPICAMIDE 1 % OP SOLN
OPHTHALMIC | Status: AC
  Administered 2024-01-18: 1 [drp] via OPHTHALMIC
  Filled 2024-01-18: qty 3

## 2024-01-18 MED ORDER — NA HYALUR & NA CHOND-NA HYALUR 0.55-0.5 ML IO KIT
PACK | INTRAOCULAR | Status: DC | PRN
  Administered 2024-01-18: 1.05 mL via INTRAOCULAR

## 2024-01-18 MED ORDER — ACETAMINOPHEN 500 MG PO TABS
ORAL_TABLET | ORAL | Status: AC
  Administered 2024-01-18: 1000 mg via ORAL
  Filled 2024-01-18: qty 2

## 2024-01-18 MED ORDER — TROPICAMIDE 1 % OP SOLN
1.0000 [drp] | OPHTHALMIC | Status: AC
  Administered 2024-01-18 (×2): 1 [drp] via OPHTHALMIC

## 2024-01-18 MED ORDER — TETRACAINE HCL 0.5 % OP SOLN: 1.0000 [drp] | Freq: Once | OPHTHALMIC | Status: AC

## 2024-01-18 MED ORDER — TETRACAINE HCL 0.5 % OP SOLN
OPHTHALMIC | Status: DC | PRN
  Administered 2024-01-18 (×2): 2 [drp] via OPHTHALMIC

## 2024-01-18 MED ORDER — LIDOCAINE 4 % EX CREAM (WRAP): TOPICAL_CREAM | Freq: Once | CUTANEOUS | Status: DC | PRN

## 2024-01-18 SURGICAL SUPPLY — 12 items
CANNULA OPHTHALMIC L11 MM ANGLE NEEDLE HYDRODISSECTION OD27 GA ALCON (Opthamology Supply) ×3 IMPLANT
CARTRIDGE LENS D CARTRIDGE MONARCH III POLYPROPYLENE POSTERIOR CHAMBER (Opthamology Supply) ×1 IMPLANT
COVER FLEXIBLE LIGHT HANDLE PLASTIC GREEN (Procedure Accessories) ×1 IMPLANT
GLOVE SURGICAL 7.5 BIOGEL PI MICRO POWDER FREE ROUGH BEAD CUFF (Glove) ×1 IMPLANT
GOWN SURGICAL XL XLONG L56 IN SMS POLYETHYLENE AURORA BLUE LEVEL 4 (Gown) ×1 IMPLANT
INTRAOCULAR CLAREON SY60WF.245 LENS (Lens) IMPLANT
KNIFE OPHTHALMIC W1 MM ANGLE FULL HANDLE DUAL BEVEL SIDEPORT CLEARCUT (Disposable Instruments) ×1 IMPLANT
KNIFE OPHTHALMIC W2.4 MM ANGLE 1 BEVEL SLIT CLEARCUT INTREPID (Blade) ×1 IMPLANT
PACK CATARACT-08 AS13353-13 (Pack) ×1 IMPLANT
PACK OPHTH WOODBURN DR FALLS PPK922506 (Pack) ×1 IMPLANT
PROTECTOR EYE TRANSPARENT WATERPROOF SEAL CROSS CONTAMINATE TAPE (Procedure Accessories) IMPLANT
SYRINGE 1 ML GRADUATE LOK MEDICAL (Syringes, Needles) ×1 IMPLANT

## 2024-01-18 NOTE — Anesthesia Postprocedure Evaluation (Addendum)
 Anesthesia Post Evaluation    Patient: [name]    LENSX RIGHT EXTRACTION, CATARACT, EXTRACAPSULAR, INSERTION INTRAOCULAR LENS (COMPLEX CATARACT) (Right: Eye)    Anesthesia type: MAC    Last Vitals:   Vitals Value Taken Time   BP 144/69 01/18/24 13:32   Temp 36.3 C (97.3 F) 01/18/24 13:32   Pulse 65 01/18/24 13:32   Resp 14 01/18/24 13:32   SpO2 93 % 01/18/24 13:32                 Anesthesia Post Evaluation:     Patient Evaluated: PACU    Level of Consciousness: awake    Pain Management: adequate  Multimodal analgesia pain management approach  Strategies: acetaminophen  and local anesthesia  Airway Patency: patent  Two or more mitigation strategies used for obstructive sleep apnea.  Strategies: multimodal analgesia and intraop administration of CPAP/nasophargyneal airway/oral appliance    Anesthetic complications: No      PONV Status: none    Cardiovascular status: acceptable  Respiratory status: acceptable  Hydration status: acceptable          Signed by: Molly Jersey, MD, 01/18/2024 1:38 PM

## 2024-01-18 NOTE — Op Note (Signed)
 Ophthalmology Surgical Operative Report    Patient name: [name]    DOB: [birth year]         Age: 68 y.o.  MR#: [id number]   Date of Surgery: 01/18/2024     ATTENDING: Marolyn Riva Cal, M.D., Ph.D.  [facility]ET[facility]    PREOPERATIVE DIAGNOSIS: 1. Visually significant cataract of the RIGHT eye 2. Mature Cataract  POSTOPERATIVE DIAGNOSIS: 1. Visually significant cataract of the RIGHT eye 2. Mature Cataract  INDICATION FOR PROCEDURE:            Visually significant cataract of the RIGHT eye adversely affecting activities of daily living.    ANESTHESIA: Monitored anesthesia care with topical anesthesia in the RIGHT eye.    COMPLICATIONS: None    PROCEDURE:  1. Femtosecond Laser 2. Complex Cataract extraction with placement of intraocular lens in the RIGHT eye 3. Trypan to stain the capsule    LENS: SY60WF +24.5        DESCRIPTION OF PROCEDURE:     Prior to the day of surgery, the risks, benefits, and alternatives were discussed and the consent was obtained.  On the day of surgery, the consent was reviewed, and all questions were answered.  The correct operative eye, being the RIGHT eye, was marked.  The surgical eye was dilated per protocol followed by a single drop of Ofloxacin  was placed in the RIGHT eye. The patient was escorted to the laser suite and timeout was performed. The laser was docked to the patient's eye and the laser was performed with the specified parameters (see laser report). The patient was escorted to the operating suite and placed in a supine position.  Cardiac monitors were placed.  A timeout was called, and all members of the operating team confirmed that the RIGHT eye was the correct operative eye, that cataract surgery was the correct procedure.  The patient was prepped and draped in a sterile ophthalmic fashion.  A 1-mm paracentesis incision was made at the 10 o'clock position of the limbus, and non preserved lidocaine  with epinephrine  was injected into the  anterior chamber. This was followed by a dispersive viscoelastic (viscoat) to expand the anterior chamber.  A 2.4 mm keratome incision was made at the 7 o'clock position of the limbus. Trypan to used to stain the capsule. A free floating capsulorrhexis was removed with Utrata forceps. Hydrodissection was performed with balanced salt solution using a flat-tipped cannula.  The lens was noted to be freely rotating.  Next, the phacoemulsification handpiece was inserted into the eye and the lens was removed with a divide and conquer technique. The patient was non cooperative and continued to look superior instead of directly ahead. An anterior capsule tear was noted after nucleus removal.   The irrigation / aspiration hand piece was used to remove the residual cortex.  The capsular bag was filled with a cohesive viscoelastic (provisc) and the IOL as noted above was injected into the posterior capsular bag. The irrigation/aspiration handpiece was used to remove the residual viscoelastic. Intracameral moxifloxacin  was injected into the [facility]. Miostat  was also injected and at the end of the case, the pupil was round.The wounds were hydrated and noted to be watertight. The eye was cleaned, and a maxitrol was placed on the ocular surface before the eye was shielded.  The patient was escorted to the recovery room in satisfactory condition.

## 2024-01-18 NOTE — H&P (Signed)
 Patient name: [name]    DOB: April 23, 1955         Age: 68 y.o.  MR#: [id number]   Date of Surgery: 01/18/2024     ATTENDING:    Marolyn Riva Cal, M.D., Ph.D.  CATHYANN:   [facility]    PREOPERATIVE DIAGNOSIS:  Cataract right Eye    PROPOSED SURGERY: Cataract Surgery / Phacoemulsification with Intraocular Lens implantation,   right eye.    I have examined the patient and have determined that the planned procedure is medically necessary.  An H&P and EKG has been completed prior to the date of surgery and no changes have occurred in   the patient's medical condition since the H&P was completed.     ALLERGIES: Allergies[1]    Medications: please refer to patient's medication list in H&P on file    I have reviewed the patient's vital signs from the nursing record.    Focused physical exam:  HEENT: cataract right  Heart: regular rate, rhythm  Lungs: clear  Abdomen: soft  Neuro: Alert and oriented      Assessment: The patient is medically stable and cleared for cataract surgery    Marolyn Cal MD, PhD  [facility]  [address]  [place], [place] [address]  [phone] phone  [phone] FAX         [1]   Allergies  Allergen Reactions    Shellfish Protein-Containing Drug Products Anaphylaxis

## 2024-01-18 NOTE — Discharge Instr - AVS First Page (Addendum)
 Reason for your Hospital Admission:  ***  LENSX RIGHT EXTRACTION, CATARACT, EXTRACAPSULAR, INSERTION INTRAOCULAR LENS (COMPLEX CATARACT) - Right     Instructions for after your discharge:      LEAVE PATCH ON UNTIL SEE SURGEON  -NO EYE DROPS TODAY     FOLLOW UP 0930 WITH DR HASKELL MAROLYN HASKELL M.D., Ph.D.  [facility]  [address], [place] [address]  [address] [place] [address]  [phone] (phone) [phone] (fax)    General Discharge Instructions for Cataract Surgery:    FOR ANY PROBLEMS, QUESTIONS, OR CONCERNS YOU MAY CALL DR. MANGUIKIAN ON HIS CELL PHONE [phone]    Leave the eye shield in place until you see Dr. Haskell tomorrow except when placing eye drops (see below for drop instructions). Please sleep with your eye shield for the first night.    Try not to lie on the operated side for the first night.    You may be up and about at home after surgery. Avoid strenuous activities. Avoid bending, stooping, or lifting heavy objects until you see the doctor tomorrow.    Avoid swimming, hot tubs or getting your eye wet for 2 weeks. You may shower, but keep your eye closed when washing your head and face. Gently dab with your towel to dry the area afterwards. DO NOT PUT PRESSURE ON THE OPERATED EYE.    Take one or two Tylenol  or Ibuprofen (Advil) every four hours as needed for pain (most people have minimal to no pain after cataract surgery). IF YOU HAVE SEVERE PAIN WITH POOR VISION AND A RED EYE, PLEASE CALL DR. MANGUIKIAN IMMEDIATELY.    Do not drive for 24 hours or sign any important legal documents. You have been administered intravenous sedation during your eye surgery and the medicines affect everyone differently.    CONTINUE TAKING ALL YOUR MEDICATIONS AS NORMAL. For example, medication for blood pressure, diabetes, etc.      HOW TO TAKE YOUR EYE DROPS AFTER SURGERY:    You have been prescribed and have at home 2 eye drop medications. One is an  antibiotic (Ofloxacin ) and the second is a steroid (Durezol or Prednisolone Acetate 1%). You will receive a detailed schedule tomorrow at your office visit.    TIMING OF DROPS: Place a set of drops when you get home after surgery, then at lunch time, then at dinner time, and before you go to bed. When you wake up the next morning, please also place a set of drops to the operated eye.    Remove your eye shield and place one drop of the antibiotic. Wait 3-4 minutes and then place a drop of the steroid. Put the eye shield back over the eye again (see below for tips on how to place drops).    FOLLOW UP APPOINTMENT: Please come to the [facility] OFFICE tomorrow morning so that Dr. Haskell may examine your eye, review your medication schedule and answer all your questions. Dr. Haskell or the discharge nurse will give you a specific time or time frame that is amenable to your schedule. Generally, it is between 8:30 and 11:00 in the morning after surgery.      GOOD TECHNIQUE FOR PLACING THE EYE DROPS:  First, wash your hands.  Pull down the lower eye lid and squeeze a drop from the bottle into the lower lid area.  Do not touch the  top of the bottle to your eye.  Do not press on or rub the eyeball.  Wait 3-4 minutes between drops to allow for absorption of the medication.     Post Anesthesia Discharge Instructions    Although you may be awake and alert in the recovery room, small amounts of anesthetic remain in your system for about 24 hours. You may feel tired and sleepy during this time.    You are advised to go directly home from the hospital.    Plan to stay at home and rest for the remainder of the day.    It is advisable to have someone with you at home for 24 hours after surgery.    Do not operate a motor vehicle, or any mechanical or electrical equipment for the next 24 hours.    Be careful when you are walking around, you may become dizzy. The effects of anesthesia and/or medications are still present and  drowsiness may occur.    Do not consume alcohol, tranquilizers, sleeping medications, or any other non prescribed medications for the remainder of the day.    Diet: Begin with liquids, progress your diet as tolerated or as directed by your surgeon. Nausea and vomiting may occur in the next 24 hours.    ==============================================================  Anesthesia Discharge Instructions: After Your Surgery  Youve just had surgery. During surgery you were given medicine called anesthesia to keep you relaxed and comfortable. After surgery you may have some pain or nausea. This is common. Your doctor or nurse will show you how to take care of yourself when you go home. He or she will also answer your questions. Here are some tips for feeling better and getting well after surgery.        For the first 24 hours after your surgery:  Do not drive or use heavy equipment.   Do not make important decisions or sign legal papers.  Do not drink alcohol.  Have someone stay with you, if needed. He or she can watch for problems and help keep you safe.  Have an adult family member or friend drive you home.    Managing pain  If you have pain after surgery, pain medicine will help you feel better. Take it as ordered by your surgeon, before pain becomes severe. Also, ask your doctor about other ways to control pain. This might be with rest, ice, repositioning, and elevation. Follow any other instructions your surgeon or nurse gives you.    Tips for taking pain medicine:    Stay on schedule with your medication  Most pain relievers taken by mouth need at least 20 to 30 minutes to start to work.  Taking medicine on a schedule can help you remember to take it. Try to time your medicine so that you can take it before starting an activity. This might be before you get dressed, go for a walk, or sit down for dinner.    Common side effects of prescription pain medications  Pain medicines can cause nausea. Eat a little food before  taking pain medicine to avoid this.  Constipation is a common side effect of pain medicines. Drinking lots of fluids and eating foods, such as fruits and vegetables, that are high in fiber can also help.   Call your doctor before taking any medicines such as laxatives or stool softeners to help ease constipation. Also, ask if you should avoid any foods. Remember, do not take laxatives unless your surgeon has prescribed them  Drinking alcohol and taking pain medicines can cause dizziness and slow your breathing. It can even be deadly. Do not drink alcohol while taking pain medicines.  Pain medicines can make you react more slowly to things. Do not drive or run machinery while taking pain medicines.    Important facts about Acetaminophen  (Tylenol )  Acetaminophen  or Tylenol  is a common pain reliever.  Check your prescription pain medication labels to see if it contains acetaminophen .  Your health care provider may tell you to take acetaminophen  to help ease your pain. Ask him or her how much you should to take each day.   Some prescription pain medicines have acetaminophen  and other ingredients. Using both prescription pain medicines and over the counter (OTC) acetaminophen  for pain can cause you to overdose.  Max dose of acetaminophen  is 4000mg /24 hours for most people. Overdosing on acetaminophen  may lead to liver failure.  Read the labels on your (over the counter) medicines with care. This will help you to clearly know the list of ingredients, how much to take, and any warnings. This will prevent you from taking too much acetaminophen .  If you have questions or do not understand the information, ask your pharmacist or health care provider to explain it to you before you take the OTC medicine.    Managing nausea  Some people have an upset stomach after surgery. This is often because of anesthesia, pain, pain medicines, or the stress of surgery. If you were on a special food plan before surgery, ask your doctor if  you should follow it while you get better. The following are tips to help you manage nausea after anesthesia:  Do not push yourself to eat. Your body will tell you when to eat and how much.  Start off with clear liquids and soup. They are easier to digest.  Next try semi-solid foods such as mashed potatoes, applesauce, and gelatin, as you feel ready.  Slowly move to solid foods. Dont eat fatty, rich, or spicy foods at first.  Do not force yourself to have 3 large meals a day. Instead eat smaller amounts more often.  Take pain medicines with a small amount of food, such as crackers or toast, to avoid nausea.     Call your surgeon if:  You have worsening pain an hour after taking pain medicine. The pain medicine may not be strong enough.  You feel too sleepy, dizzy, or groggy. The pain medicine may be too strong.  You have side effects like persistent nausea, vomiting, or skin changes such as rash, itching, or hives.   You have bleeding through your dressing or your dressing becomes saturated.  You have signs of infection such as redness, fever, or increased/foul smelling drainage.      If you have obstructive sleep apnea (OSA)  You were given anesthesia medicine during surgery to keep you comfortable and free of pain. After surgery, you may have more apnea spells because of this medicine and other medicines you were given. These episodes may last longer than usual.   At home:   Keep using the continuous positive airway pressure (CPAP) device when you sleep. Unless your health care provider tells you not to, use it when you sleep,   or take a nap; day or night. CPAP is a common device used to treat OSA.   *    Sleep/nap in upright position(ie. with pillows), or use a recliner for first week  and/or while on opioid medications or medications that are  sedating.  *     Another adult needs to be with you at all times during the first 24 hours home.  *     Limit opioid use when possible and use alternative comfort  measures.  * Talk with your provider before taking any pain medicine, muscle relaxants, or  sedatives. Your provider will tell you about the possible dangers of taking these         medicines.

## 2024-01-18 NOTE — Anesthesia Preprocedure Evaluation (Signed)
 Anesthesia Evaluation    AIRWAY    Mallampati: II    TM distance: >3 FB  Neck ROM: full  Mouth Opening:full  Planned to use difficult airway equipment: No CARDIOVASCULAR    cardiovascular exam normal       DENTAL    no notable dental hx               PULMONARY    pulmonary exam normal     OTHER FINDINGS    Left ventricular chamber dimension, wall thickness and function are normal.  LVEF is 60-65%. No regional wall motion abnormalities. Diastolic function is  indeterminate.  Right Ventricle    Right ventricle is normal in size. There is mildly reduced systolic  function.                                      Relevant Problems   CARDIO   (+) Acute on chronic congestive heart failure, unspecified heart failure type (CMS/HCC)   (+) Atrial fibrillation (CMS/HCC)   (+) CAD (coronary artery disease)   (+) Hypertension   (+) Non-rheumatic mitral regurgitation   (+) Pulmonary hypertension (CMS/HCC)   (+) Venous insufficiency      GU/RENAL   (+) Fatty liver   (+) Stage 3b chronic kidney disease (CMS/HCC)      ENDO   (+) Type 2 diabetes mellitus with obesity   (+) Type 2 diabetes mellitus without complication, with long-term current use of insulin  (CMS/HCC)               Anesthesia Plan    ASA 2     MAC                     intravenous induction   Detailed anesthesia plan: MAC        Post op pain management: per surgeon    informed consent obtained                     Signed by: Molly Jersey, MD 01/18/2024 11:00 AM

## 2024-02-09 ENCOUNTER — Other Ambulatory Visit (INDEPENDENT_AMBULATORY_CARE_PROVIDER_SITE_OTHER): Payer: Self-pay | Admitting: Internal Medicine

## 2024-02-09 DIAGNOSIS — I10 Essential (primary) hypertension: Secondary | ICD-10-CM

## 2024-02-14 ENCOUNTER — Ambulatory Visit (INDEPENDENT_AMBULATORY_CARE_PROVIDER_SITE_OTHER)

## 2024-02-14 ENCOUNTER — Encounter (INDEPENDENT_AMBULATORY_CARE_PROVIDER_SITE_OTHER): Payer: Self-pay | Admitting: Internal Medicine

## 2024-02-14 ENCOUNTER — Encounter (INDEPENDENT_AMBULATORY_CARE_PROVIDER_SITE_OTHER): Payer: Self-pay

## 2024-02-14 VITALS — BP 143/66 | HR 67 | Temp 98.2°F | Ht 65.0 in | Wt 172.6 lb

## 2024-02-14 DIAGNOSIS — N1832 Chronic kidney disease, stage 3b: Secondary | ICD-10-CM

## 2024-02-14 DIAGNOSIS — R252 Cramp and spasm: Secondary | ICD-10-CM

## 2024-02-14 DIAGNOSIS — R809 Proteinuria, unspecified: Secondary | ICD-10-CM

## 2024-02-14 DIAGNOSIS — E1129 Type 2 diabetes mellitus with other diabetic kidney complication: Secondary | ICD-10-CM

## 2024-02-14 DIAGNOSIS — I480 Paroxysmal atrial fibrillation: Secondary | ICD-10-CM

## 2024-02-14 DIAGNOSIS — I10 Essential (primary) hypertension: Secondary | ICD-10-CM

## 2024-02-14 DIAGNOSIS — I5033 Acute on chronic diastolic (congestive) heart failure: Secondary | ICD-10-CM | POA: Insufficient documentation

## 2024-02-14 DIAGNOSIS — E7800 Pure hypercholesterolemia, unspecified: Secondary | ICD-10-CM

## 2024-02-14 DIAGNOSIS — H6121 Impacted cerumen, right ear: Secondary | ICD-10-CM

## 2024-02-14 DIAGNOSIS — Z794 Long term (current) use of insulin: Secondary | ICD-10-CM

## 2024-02-14 LAB — HEMOGLOBIN A1C
Average Estimated Glucose: 180 mg/dL
Hemoglobin A1C: 7.9 % — ABNORMAL HIGH (ref 4.6–5.6)

## 2024-02-14 LAB — LIPID PANEL
Cholesterol / HDL Ratio: 2.9 {index}
Cholesterol: 136 mg/dL (ref <=199)
HDL: 47 mg/dL (ref >=40)
LDL Calculated: 72 mg/dL (ref 0–99)
Non-HDL Cholesterol: 89 mg/dL (ref <130.0)
Triglycerides: 92 mg/dL (ref 34–149)
VLDL Calculated: 14 mg/dL (ref 10–40)

## 2024-02-14 LAB — LAB USE ONLY - CBC WITH DIFFERENTIAL
Absolute Basophils: 0.04 x10 3/uL (ref 0.00–0.08)
Absolute Eosinophils: 0.59 x10 3/uL — ABNORMAL HIGH (ref 0.00–0.44)
Absolute Immature Granulocytes: 0.03 x10 3/uL (ref 0.00–0.07)
Absolute Lymphocytes: 1.88 x10 3/uL (ref 0.42–3.22)
Absolute Monocytes: 0.87 x10 3/uL — ABNORMAL HIGH (ref 0.21–0.85)
Absolute Neutrophils: 5.64 x10 3/uL (ref 1.10–6.33)
Absolute nRBC: 0 x10 3/uL (ref <=0.00)
Basophils %: 0.4 %
Eosinophils %: 6.5 %
Hematocrit: 46.3 % — ABNORMAL HIGH (ref 34.7–43.7)
Hemoglobin: 15.4 g/dL — ABNORMAL HIGH (ref 11.4–14.8)
Immature Granulocytes %: 0.3 %
Lymphocytes %: 20.8 %
MCH: 30.1 pg (ref 25.1–33.5)
MCHC: 33.3 g/dL (ref 31.5–35.8)
MCV: 90.6 fL (ref 78.0–96.0)
MPV: 12.2 fL (ref 8.9–12.5)
Monocytes %: 9.6 %
Neutrophils %: 62.4 %
Platelet Count: 216 x10 3/uL (ref 142–346)
Preliminary Absolute Neutrophil Count: 5.64 x10 3/uL (ref 1.10–6.33)
RBC: 5.11 x10 6/uL — ABNORMAL HIGH (ref 3.90–5.10)
RDW: 14 % (ref 11–15)
WBC: 9.05 x10 3/uL (ref 3.10–9.50)
nRBC %: 0 /100{WBCs} (ref <=0.0)

## 2024-02-14 LAB — COMPREHENSIVE METABOLIC PANEL
ALT: 22 U/L (ref <=55)
AST (SGOT): 27 U/L (ref <=41)
Albumin/Globulin Ratio: 1.1 (ref 0.9–2.2)
Albumin: 3.8 g/dL (ref 3.5–4.9)
Alkaline Phosphatase: 161 U/L — ABNORMAL HIGH (ref 37–117)
Anion Gap: 9 (ref 5.0–15.0)
BUN: 21 mg/dL (ref 7–21)
Bilirubin, Total: 0.8 mg/dL (ref 0.2–1.2)
CO2: 29 meq/L (ref 17–29)
Calcium: 9.2 mg/dL (ref 8.5–10.5)
Chloride: 104 meq/L (ref 99–111)
Creatinine: 1.2 mg/dL — ABNORMAL HIGH (ref 0.4–1.0)
GFR: 48.7 mL/min/1.73 m2 — ABNORMAL LOW (ref >=60.0)
Globulin: 3.5 g/dL (ref 2.0–3.6)
Glucose: 98 mg/dL (ref 70–100)
Hemolysis Index: 16 {index}
Potassium: 4.4 meq/L (ref 3.5–5.3)
Protein, Total: 7.3 g/dL (ref 6.0–8.3)
Sodium: 142 meq/L (ref 135–145)

## 2024-02-14 LAB — IRON PROFILE
Iron Saturation: 52 % — ABNORMAL HIGH (ref 15–50)
Iron: 139 ug/dL (ref 32–157)
TIBC: 268 ug/dL (ref 265–497)
UIBC: 129 ug/dL (ref 126–382)

## 2024-02-14 LAB — TSH: TSH: 0.76 u[IU]/mL (ref 0.35–4.94)

## 2024-02-14 LAB — VITAMIN B12: Vitamin B-12: 1725 pg/mL — ABNORMAL HIGH (ref 211–911)

## 2024-02-14 LAB — MAGNESIUM: Magnesium: 2.2 mg/dL (ref 1.6–2.6)

## 2024-02-14 MED ORDER — CARBAMIDE PEROXIDE 6.5 % OT SOLN: 5.0000 [drp] | Freq: Two times a day (BID) | OTIC | 2 refills | Status: AC

## 2024-02-14 MED ORDER — AMLODIPINE BESYLATE 5 MG PO TABS: 5.0000 mg | ORAL_TABLET | Freq: Every day | ORAL | 3 refills | Status: AC

## 2024-02-14 MED ORDER — NOVOLIN N 100 UNIT/ML SC SUSP: SUBCUTANEOUS | 3 refills | Status: AC

## 2024-02-14 MED ORDER — BUMETANIDE 2 MG PO TABS: 2.0000 mg | ORAL_TABLET | ORAL | 3 refills | Status: AC

## 2024-02-14 MED ORDER — TIMOLOL MALEATE 0.5 % OP SOLN: 1.0000 [drp] | Freq: Every day | OPHTHALMIC | 2 refills | Status: AC

## 2024-02-14 MED ORDER — CARVEDILOL 12.5 MG PO TABS: 6.2500 mg | ORAL_TABLET | Freq: Two times a day (BID) | ORAL | 3 refills | Status: AC

## 2024-02-14 MED ORDER — DABIGATRAN ETEXILATE MESYLATE 150 MG PO CAPS: 150.0000 mg | ORAL_CAPSULE | Freq: Two times a day (BID) | ORAL | 1 refills | Status: AC

## 2024-02-14 MED ORDER — GLUCOSE BLOOD VI STRP: ORAL_STRIP | 2 refills | Status: AC

## 2024-02-14 MED ORDER — INSULIN SYRINGE-NEEDLE U-100 31G X 5/16" 1 ML MISC: 90.0000 | Freq: Three times a day (TID) | 0 refills | Status: AC

## 2024-02-14 MED ORDER — LISINOPRIL 40 MG PO TABS: 40.0000 mg | ORAL_TABLET | Freq: Every day | ORAL | 0 refills | Status: AC

## 2024-02-14 MED ORDER — EMPAGLIFLOZIN 25 MG PO TABS: 25.0000 mg | ORAL_TABLET | Freq: Every morning | ORAL | 3 refills | Status: AC

## 2024-02-14 MED ORDER — ATORVASTATIN CALCIUM 40 MG PO TABS: 40.0000 mg | ORAL_TABLET | Freq: Every evening | ORAL | 3 refills | Status: AC

## 2024-02-14 MED ORDER — ACCU-CHEK AVIVA PLUS W/DEVICE KIT: PACK | 1 refills | Status: AC

## 2024-02-14 NOTE — Assessment & Plan Note (Addendum)
-   No longer on Eliquis  due to price, continue with dabigatran  150 mg twice daily  Orders:    dabigatran  (PRADAXA ) 150 MG Cap; Take 1 capsule (150 mg) by mouth every 12 (twelve) hours

## 2024-02-14 NOTE — Progress Notes (Signed)
 Have you seen any specialists/other providers since your last visit with us ?    Yes    Do you agree to telemedicine visit?  No    Arm preference verified?   Yes, no preference    Health Maintenance Due   Topic Date Due    Advance Directive on File  Never done    Medicare Annual Wellness Visit  Never done    Pap Smear  12/17/2016    Colorectal Cancer Screening  06/29/2023    Breast Cancer Screening - Mammogram  06/29/2023    Tetanus Ten-Year  07/30/2023    OPHTHALMOLOGY EXAM  08/23/2023    CV Echocardiogram  09/03/2023    FALLS RISK ANNUAL  12/26/2023    DEPRESSION SCREENING  12/26/2023

## 2024-02-14 NOTE — Assessment & Plan Note (Signed)
-   c/w statin   Orders:    atorvastatin  (LIPITOR) 40 MG tablet; Take 1 tablet (40 mg) by mouth once every evening

## 2024-02-14 NOTE — Progress Notes (Signed)
 [facility]   OFFICE VISIT                HPI     Chief Complaint   Patient presents with    Diabetes Follow-up     Fasting for Blood work (Magnesium )      HPI  [name] is a 69 year old female past medical history of type 2 diabetes, hypertension, hyperlipidemia, CAD, A-fib, diastolic heart failure, fatty liver, CKD 3.  She is coming in for diabetes follow-up.   Last office visit 9/25 with Dr. Jacob for cough and diabetes follow-up.    She reports overall doing well in terms of her chronic issues.  No changes in her medications since her last visit.  She reports since her last visit, she had cataract surgery in December 2025.  Her only new medication was a steroid drops after the surgery.    She notes that over the past week she has had worsening anxiety.  She attributes this to the fact that she was recently supposed to retire, but contract got canceled.  Along with the increased anxiety however she has also had sudden onset of abnormal jerking movements in the right arm/leg.  She has had no associated facial movements.  She has not noticed any change in the tremors with activities.  She has no muscle pain or paresthesias.  She does note itching in the ankle which she attributes to varicose veins.  She recently had ulcer on her leg that is starting to open up.  She denies any recent respiratory illnesses.  No changes in her bowel movements, no recent diarrheal illness.  No new medications except for the steroid eyedrops after surgery.  She does not take any over-the-counter supplements.  No recent travel.    ROS   Review of Systems   Constitutional:  Negative for chills, fatigue and unexpected weight change.   HENT:  Negative for congestion, sinus pressure and trouble swallowing.    Eyes:  Negative for visual disturbance.   Respiratory:  Negative for cough, chest tightness, shortness of breath, wheezing and stridor.    Cardiovascular:  Negative for chest pain, palpitations and leg swelling.    Gastrointestinal:  Negative for abdominal pain, diarrhea, nausea and vomiting.   Genitourinary:  Negative for dysuria, flank pain and frequency.   Musculoskeletal:  Negative for back pain, joint swelling and myalgias.   Skin:  Negative for rash.   Neurological:  Positive for tremors. Negative for dizziness, facial asymmetry, weakness, light-headedness, numbness and headaches.   Psychiatric/Behavioral:  The patient is nervous/anxious.        Vital Signs   BP 143/66 (BP Site: Right arm, Patient Position: Sitting, Cuff Size: Medium)   Pulse 67   Temp 98.2 F (36.8 C)   Ht 1.651 m (5' 5)   Wt 78.3 kg (172 lb 9.6 oz)   SpO2 94%   BMI 28.72 kg/m   Physical Exam   Physical Exam  Constitutional:       General: She is not in acute distress.     Appearance: Normal appearance.   HENT:      Head: Normocephalic.      Mouth/Throat:      Mouth: Mucous membranes are moist.      Pharynx: Oropharynx is clear.   Eyes:      Conjunctiva/sclera: Conjunctivae normal.      Pupils: Pupils are equal, round, and reactive to light.   Cardiovascular:      Rate and Rhythm:  Normal rate and regular rhythm.      Pulses: Normal pulses.      Heart sounds: Normal heart sounds.   Pulmonary:      Effort: Pulmonary effort is normal.      Breath sounds: Normal breath sounds. No wheezing, rhonchi or rales.   Abdominal:      General: Abdomen is flat. Bowel sounds are normal.      Palpations: Abdomen is soft.      Tenderness: There is no abdominal tenderness.   Musculoskeletal:         General: Normal range of motion.      Cervical back: Normal range of motion and neck supple.   Skin:     General: Skin is warm and dry.   Neurological:      General: No focal deficit present.      Mental Status: She is alert and oriented to person, place, and time. Mental status is at baseline.      Comments: Abnormal jerking movements noted, strength 5/5 in b/l upper and lower extremities, sensation in tact, cranial nerves in tact. Non focal neurologic exam          Assessment/Plan     Assessment & Plan  Jerking movements of extremities  - sudden onset, reviewed medication list   - send TSH/B12/CBC/CMP/iron   - [facility] - Dr. Alyce Gosling   - contacted Dr. Gosling via Epic Chat, recommends MRI brain to r/o lacunar stroke   - follow up STAT [facility] and MRI Brain     Orders:    Comprehensive Metabolic Panel; Future    TSH; Future    Vitamin B12; Future    Magnesium ; Future    Iron Profile; Future    CT Head WO Contrast; Future    Referral to Neurology; Future    MRI Brain W WO Contrast; Future    Type 2 diabetes mellitus with microalbuminuria, with long-term current use of insulin  (CMS/HCC)  -Continue with NPH  -Continue Jardiance   Orders:    Comprehensive Metabolic Panel; Future    Hemoglobin A1C; Future    TSH; Future    Lipid Panel; Future    atorvastatin  (LIPITOR) 40 MG tablet; Take 1 tablet (40 mg) by mouth once every evening    empagliflozin  (Jardiance ) 25 MG tablet; Take 1 tablet (25 mg) by mouth once every morning    glucose blood (Accu-Chek Aviva) test strip; Check blood glucose level twice daily. ICD 10 Code E11.9, Z79.4    insulin  NPH (NovoLIN  N) 100 UNIT/ML injection; INJECT 30 UNITS SUBCUTANEOUSLY WITH BREAKFST AND 35  UNITS WITH DINNERI    Insulin  Syringe-Needle U-100 (BD Insulin  Syringe Ultrafine) 31G X 5/16 1 ML Misc; Inject 90 each as directed 3 (three) times daily before meals    Acute on chronic diastolic (congestive) heart failure (CMS/HCC)  -Continue lisinopril  40 mg, Coreg  6.25 mg, Jardiance  25 mg  - Not on spironolactone 2/2 hyperkalemia  Orders:    Comprehensive Metabolic Panel; Future    Hemoglobin A1C; Future    Iron Profile; Future    Lipid Panel; Future    Paroxysmal atrial fibrillation (CMS/HCC)  - No longer on Eliquis  due to price, continue with dabigatran  150 mg twice daily  Orders:    dabigatran  (PRADAXA ) 150 MG Cap; Take 1 capsule (150 mg) by mouth every 12 (twelve) hours    Stage 3b chronic kidney disease  (CMS/HCC)         Essential hypertension  --  Continue with lisinopril  40 mg daily, carvedilol  12.5 mg twice daily, nifedipine  30 mg daily  Orders:    CBC with Differential (Order); Future    Comprehensive Metabolic Panel; Future    TSH; Future    lisinopril  (ZESTRIL ) 40 MG tablet; Take 1 tablet (40 mg) by mouth once daily    Pure hypercholesterolemia  - c/w statin   Orders:    atorvastatin  (LIPITOR) 40 MG tablet; Take 1 tablet (40 mg) by mouth once every evening    Impacted cerumen of right ear  - start carbamide peroxide solution   Orders:    carbamide peroxide (DEBROX) 6.5 % otic solution; Place 5 drops into the right ear 2 (two) times daily    Blood Pressure Screening  Patient's blood pressure is BP: 143/66. Recommended patient to consume a diet rich in vegetables, fruits, whole grains and low-fat dairy products, poultry and fish, with a reduced content of salt and saturated fat, engage in regular aerobic physical activity at least 30 minutes per day, most days of the week, limit alcohol consumption to no more than one drink a day for women or two drinks a day for men, and work towards or maintain a normal body weight (BMI less than 25). Patient's treatment plan includes second Hypertensive (SBP >= 130-139 mm Hg OR DBP >= 80-89 mm Hg with prior hypertensive reading) follow up with our office in 2 to 6 months.

## 2024-02-14 NOTE — Progress Notes (Addendum)
 Accu-Chek Aviva Plus w/ Device Kit. PA was sent to [facility] via cover my meds  Key: [facility]  PA  request has been approved.

## 2024-02-14 NOTE — Assessment & Plan Note (Addendum)
-  Continue lisinopril  40 mg, Coreg  6.25 mg, Jardiance  25 mg  - Not on spironolactone 2/2 hyperkalemia  Orders:    Comprehensive Metabolic Panel; Future    Hemoglobin A1C; Future    Iron Profile; Future    Lipid Panel; Future

## 2024-02-16 ENCOUNTER — Encounter (INDEPENDENT_AMBULATORY_CARE_PROVIDER_SITE_OTHER): Payer: Self-pay | Admitting: Internal Medicine

## 2024-02-16 ENCOUNTER — Telehealth (INDEPENDENT_AMBULATORY_CARE_PROVIDER_SITE_OTHER): Payer: Self-pay

## 2024-02-16 NOTE — Telephone Encounter (Signed)
 PSS RN Note:    PSS RN Note:    Outgoing call attempted x3 to complete PSS phone interview for DOS 02/22/24. DB message left via [employer] Spanish interpreter ID# [id number] containing pre-procedural instructions. Pt to follow arrival time per surgeon's office. NPO instructions provided per anesthesia guidelines. Pt advised to bring photo ID, insurance card, a list of current medications and ensure that they will have a responsible adult provide ride home. Per chart review, patient is taking empagliflozin /Jardiance  - per AGL, advised patient to hold x72 hrs prior to DOS (LD 02/18/24 prior to [facility] 02/22/24).  Pt to call PEC [phone] with any additional questions.

## 2024-02-19 ENCOUNTER — Ambulatory Visit: Payer: Self-pay

## 2024-02-21 ENCOUNTER — Telehealth (INDEPENDENT_AMBULATORY_CARE_PROVIDER_SITE_OTHER): Payer: Self-pay | Admitting: Internal Medicine

## 2024-02-21 NOTE — Telephone Encounter (Signed)
 [facility] called stating they need most recent clinical notes to be faxed over to their fax #: [phone]. Please assist, thank you.

## 2024-02-21 NOTE — Telephone Encounter (Signed)
 Clinical notes faxed

## 2024-02-22 ENCOUNTER — Ambulatory Visit: Admit: 2024-02-22

## 2024-02-22 HISTORY — DX: Cardiac arrhythmia, unspecified: I49.9

## 2024-02-22 SURGERY — EXTRACTION, CATARACT, EXTRACAPSULAR, INSERTION INTRAOCULAR LENS
Anesthesia: Anesthesia MAC / Sedation | Laterality: Left

## 2024-02-27 ENCOUNTER — Ambulatory Visit (INDEPENDENT_AMBULATORY_CARE_PROVIDER_SITE_OTHER): Payer: Self-pay

## 2024-02-27 DIAGNOSIS — I10 Essential (primary) hypertension: Secondary | ICD-10-CM

## 2024-02-27 DIAGNOSIS — E119 Type 2 diabetes mellitus without complications: Secondary | ICD-10-CM

## 2024-02-28 ENCOUNTER — Ambulatory Visit

## 2024-03-01 ENCOUNTER — Encounter (INDEPENDENT_AMBULATORY_CARE_PROVIDER_SITE_OTHER): Payer: Self-pay | Admitting: Internal Medicine

## 2024-03-01 ENCOUNTER — Encounter: Payer: Self-pay | Admitting: Neurology

## 2024-03-01 ENCOUNTER — Ambulatory Visit (INDEPENDENT_AMBULATORY_CARE_PROVIDER_SITE_OTHER): Admitting: Internal Medicine

## 2024-03-01 VITALS — BP 133/83 | HR 73 | Temp 97.0°F | Ht 65.0 in | Wt 172.2 lb

## 2024-03-01 DIAGNOSIS — Z1211 Encounter for screening for malignant neoplasm of colon: Secondary | ICD-10-CM

## 2024-03-01 DIAGNOSIS — Z0000 Encounter for general adult medical examination without abnormal findings: Secondary | ICD-10-CM

## 2024-03-01 DIAGNOSIS — Z1231 Encounter for screening mammogram for malignant neoplasm of breast: Secondary | ICD-10-CM

## 2024-03-01 NOTE — Progress Notes (Unsigned)
 [facility]   OFFICE VISIT          {  Disappearing Text  Click a link below to be taken to that activity or part of the chart   Chart Review  Order Review  Review Flowsheets  Labs  Health Maintenance  Immunizations  Allergies  Medications  Problem List  History  Synopsis   :[id number]}      HPI     Chief Complaint   Patient presents with    Hypertension    mri follow up result      HPI    [name] is a 69 year old female past medical history of type 2 diabetes, hypertension, hyperlipidemia, CAD, A-fib, diastolic heart failure, fatty liver, CKD 3. Here for annual exam    Cataract surgery 01/2024    T2DM   Most recent A1c 7.9%   Chesks BS twice a day  Lately she feels her BS is much better controlled  Am 100's  PM 130-140, 170  jardiance  25 mg daily  Currently on NPH 30 U in AM and night time 35U  On Ace-I and Statin  Up to date with eye exam- she says she has upcoming surgery     HLD   On lipitor tolerating well     HFpEF  On Lisinopril  40 mg daily  Amlodipine  5 mg daily  On Bumex  alternating 1mg  daily  On Coreg  6.25 BID   She is back on NIfedipine   Had hyperkalemia with spironolactone  Echo 09/04/2022 showed LVEF of 60-65% with indeterminate diastolic function, moderate tricuspid regurgitation   She says Dr Katrine placed her back on Nifedipine  ER 30 mg however she had stop due to low BP readings     AF   No longer on Eliquis  due to price  Dabigatran  150 mg BID     HTN ,with white coat syndrome  On Lisinopril  40 qD  Carvedilol  12.5 mg BID  BP at home usually 120-130's  Nifedipine  30 mg daily restarted however pt says     Health maintenance:  Breast cancer screening -   Cervical cancer screening -   Colon cancer screening -   Shingles vaccine - UTD  Flu vaccine - UTD  Pneumonia vaccine-UTD  Covid vaccine -UTD  Tetanus vaccine -  07/2013  Dexa scan -Osteoporosis 2024      ROS   Review of Systems    Vital Signs   BP 133/83 (BP Site: Left arm, Patient Position: Sitting, Cuff Size: Large)   Pulse 73    Temp 97 F (36.1 C) (Oral)   Ht 1.651 m (5' 5)   Wt 78.1 kg (172 lb 3.2 oz)   SpO2 96%   BMI 28.66 kg/m   Physical Exam   Physical Exam   Assessment/Plan     Assessment & Plan  Encounter for screening mammogram for malignant neoplasm of breast    Orders:    Mammo Screening 3D/Tomo Bilateral; Future    Colon cancer screening    Orders:    Referral to Gastroenterology ([place]); Future

## 2024-06-03 ENCOUNTER — Ambulatory Visit (INDEPENDENT_AMBULATORY_CARE_PROVIDER_SITE_OTHER): Admitting: Internal Medicine

## 2024-07-26 ENCOUNTER — Ambulatory Visit: Admitting: Student in an Organized Health Care Education/Training Program

## 2024-09-19 ENCOUNTER — Ambulatory Visit: Admitting: Neurology
